# Patient Record
Sex: Male | Born: 1945 | Race: White | Hispanic: No | Marital: Married | Smoking: Never smoker
Health system: Southern US, Community
[De-identification: ages and names within clinical notes are randomized; demographics above are authoritative.]

## PROBLEM LIST (undated history)

## (undated) DIAGNOSIS — N200 Calculus of kidney: Secondary | ICD-10-CM

## (undated) DIAGNOSIS — B029 Zoster without complications: Secondary | ICD-10-CM

## (undated) DIAGNOSIS — M1990 Unspecified osteoarthritis, unspecified site: Secondary | ICD-10-CM

## (undated) DIAGNOSIS — R42 Dizziness and giddiness: Secondary | ICD-10-CM

## (undated) DIAGNOSIS — K219 Gastro-esophageal reflux disease without esophagitis: Secondary | ICD-10-CM

## (undated) DIAGNOSIS — E785 Hyperlipidemia, unspecified: Secondary | ICD-10-CM

## (undated) DIAGNOSIS — K5790 Diverticulosis of intestine, part unspecified, without perforation or abscess without bleeding: Secondary | ICD-10-CM

## (undated) DIAGNOSIS — K635 Polyp of colon: Secondary | ICD-10-CM

## (undated) DIAGNOSIS — E119 Type 2 diabetes mellitus without complications: Secondary | ICD-10-CM

## (undated) DIAGNOSIS — I10 Essential (primary) hypertension: Secondary | ICD-10-CM

## (undated) DIAGNOSIS — C801 Malignant (primary) neoplasm, unspecified: Secondary | ICD-10-CM

## (undated) DIAGNOSIS — K319 Disease of stomach and duodenum, unspecified: Secondary | ICD-10-CM

## (undated) DIAGNOSIS — I2510 Atherosclerotic heart disease of native coronary artery without angina pectoris: Secondary | ICD-10-CM

## (undated) DIAGNOSIS — M069 Rheumatoid arthritis, unspecified: Secondary | ICD-10-CM

## (undated) DIAGNOSIS — T7840XA Allergy, unspecified, initial encounter: Secondary | ICD-10-CM

## (undated) DIAGNOSIS — Z923 Personal history of irradiation: Secondary | ICD-10-CM

## (undated) HISTORY — DX: Disease of stomach and duodenum, unspecified: K31.9

## (undated) HISTORY — DX: Hyperlipidemia, unspecified: E78.5

## (undated) HISTORY — DX: Calculus of kidney: N20.0

## (undated) HISTORY — DX: Essential (primary) hypertension: I10

## (undated) HISTORY — DX: Unspecified osteoarthritis, unspecified site: M19.90

## (undated) HISTORY — DX: Diverticulosis of intestine, part unspecified, without perforation or abscess without bleeding: K57.90

## (undated) HISTORY — DX: Rheumatoid arthritis, unspecified: M06.9

## (undated) HISTORY — PX: CYSTOSCOPY: SUR368

## (undated) HISTORY — DX: Dizziness and giddiness: R42

## (undated) HISTORY — DX: Type 2 diabetes mellitus without complications: E11.9

## (undated) HISTORY — DX: Atherosclerotic heart disease of native coronary artery without angina pectoris: I25.10

## (undated) HISTORY — PX: LITHOTRIPSY: SUR834

## (undated) HISTORY — DX: Polyp of colon: K63.5

---

## 2003-03-03 HISTORY — PX: CORONARY ARTERY BYPASS GRAFT: SHX141

## 2005-05-15 ENCOUNTER — Ambulatory Visit: Payer: Self-pay | Admitting: Internal Medicine

## 2005-05-22 ENCOUNTER — Ambulatory Visit: Payer: Self-pay | Admitting: Internal Medicine

## 2005-06-15 ENCOUNTER — Ambulatory Visit: Payer: Self-pay | Admitting: Internal Medicine

## 2005-07-15 ENCOUNTER — Ambulatory Visit: Payer: Self-pay | Admitting: Internal Medicine

## 2005-10-09 ENCOUNTER — Ambulatory Visit: Payer: Self-pay | Admitting: Internal Medicine

## 2005-12-16 ENCOUNTER — Ambulatory Visit: Payer: Self-pay | Admitting: Cardiology

## 2005-12-29 ENCOUNTER — Ambulatory Visit: Payer: Self-pay | Admitting: Cardiology

## 2005-12-29 ENCOUNTER — Encounter: Payer: Self-pay | Admitting: Cardiology

## 2005-12-29 ENCOUNTER — Ambulatory Visit: Payer: Self-pay

## 2006-01-05 ENCOUNTER — Ambulatory Visit: Payer: Self-pay | Admitting: Internal Medicine

## 2006-02-11 ENCOUNTER — Ambulatory Visit: Payer: Self-pay | Admitting: Internal Medicine

## 2006-06-15 ENCOUNTER — Encounter: Payer: Self-pay | Admitting: Internal Medicine

## 2006-06-16 ENCOUNTER — Ambulatory Visit: Payer: Self-pay | Admitting: Internal Medicine

## 2006-06-16 LAB — CONVERTED CEMR LAB
ALT: 28 units/L (ref 0–40)
AST: 26 units/L (ref 0–37)
CK-MB: 2.1 ng/mL (ref 0.3–4.0)
CO2: 30 meq/L (ref 19–32)
Creatinine, Ser: 0.8 mg/dL (ref 0.4–1.5)
GFR calc Af Amer: 126 mL/min
GFR calc non Af Amer: 104 mL/min
Potassium: 4.3 meq/L (ref 3.5–5.1)
Sodium: 144 meq/L (ref 135–145)
Total Protein: 6.9 g/dL (ref 6.0–8.3)

## 2006-10-13 ENCOUNTER — Encounter: Payer: Self-pay | Admitting: Internal Medicine

## 2006-12-22 ENCOUNTER — Ambulatory Visit: Payer: Self-pay | Admitting: Internal Medicine

## 2007-01-24 ENCOUNTER — Ambulatory Visit: Payer: Self-pay | Admitting: Internal Medicine

## 2007-01-24 DIAGNOSIS — E1142 Type 2 diabetes mellitus with diabetic polyneuropathy: Secondary | ICD-10-CM | POA: Insufficient documentation

## 2007-01-24 DIAGNOSIS — E782 Mixed hyperlipidemia: Secondary | ICD-10-CM | POA: Insufficient documentation

## 2007-01-24 DIAGNOSIS — J329 Chronic sinusitis, unspecified: Secondary | ICD-10-CM | POA: Insufficient documentation

## 2007-01-24 DIAGNOSIS — M25519 Pain in unspecified shoulder: Secondary | ICD-10-CM | POA: Insufficient documentation

## 2007-01-24 DIAGNOSIS — E785 Hyperlipidemia, unspecified: Secondary | ICD-10-CM | POA: Insufficient documentation

## 2007-01-24 DIAGNOSIS — I10 Essential (primary) hypertension: Secondary | ICD-10-CM | POA: Insufficient documentation

## 2007-01-26 ENCOUNTER — Ambulatory Visit: Payer: Self-pay | Admitting: Cardiology

## 2007-01-31 ENCOUNTER — Ambulatory Visit: Payer: Self-pay | Admitting: Internal Medicine

## 2007-01-31 ENCOUNTER — Encounter (INDEPENDENT_AMBULATORY_CARE_PROVIDER_SITE_OTHER): Payer: Self-pay | Admitting: *Deleted

## 2007-01-31 LAB — CONVERTED CEMR LAB
Alkaline Phosphatase: 57 units/L (ref 39–117)
BUN: 10 mg/dL (ref 6–23)
Bilirubin, Direct: 0.2 mg/dL (ref 0.0–0.3)
CO2: 26 meq/L (ref 19–32)
Chloride: 110 meq/L (ref 96–112)
Creatinine, Ser: 0.9 mg/dL (ref 0.4–1.5)
Crystals: NEGATIVE
GFR calc Af Amer: 110 mL/min
Hgb A1c MFr Bld: 6 % (ref 4.6–6.0)
Leukocytes, UA: NEGATIVE
Potassium: 3.9 meq/L (ref 3.5–5.1)
Sodium: 143 meq/L (ref 135–145)
Squamous Epithelial / LPF: NEGATIVE /lpf
TSH: 1.98 microintl units/mL (ref 0.35–5.50)
Total Protein: 6.7 g/dL (ref 6.0–8.3)
Triglycerides: 127 mg/dL (ref 0–149)
VLDL: 25 mg/dL (ref 0–40)

## 2007-02-28 ENCOUNTER — Encounter: Payer: Self-pay | Admitting: Internal Medicine

## 2007-03-09 ENCOUNTER — Encounter: Payer: Self-pay | Admitting: Internal Medicine

## 2007-03-28 ENCOUNTER — Ambulatory Visit: Payer: Self-pay | Admitting: Internal Medicine

## 2007-03-28 DIAGNOSIS — M109 Gout, unspecified: Secondary | ICD-10-CM | POA: Insufficient documentation

## 2007-05-16 ENCOUNTER — Ambulatory Visit: Payer: Self-pay | Admitting: Internal Medicine

## 2007-05-30 ENCOUNTER — Telehealth: Payer: Self-pay | Admitting: Internal Medicine

## 2007-06-27 ENCOUNTER — Encounter: Payer: Self-pay | Admitting: Internal Medicine

## 2007-07-07 ENCOUNTER — Ambulatory Visit: Payer: Self-pay | Admitting: Internal Medicine

## 2007-07-07 LAB — CONVERTED CEMR LAB
Creatinine, Ser: 0.9 mg/dL (ref 0.4–1.5)
Glucose, Bld: 215 mg/dL — ABNORMAL HIGH (ref 70–99)
Uric Acid, Serum: 3.9 mg/dL — ABNORMAL LOW (ref 4.0–7.8)

## 2007-07-11 ENCOUNTER — Ambulatory Visit: Payer: Self-pay | Admitting: Internal Medicine

## 2007-07-11 DIAGNOSIS — H8110 Benign paroxysmal vertigo, unspecified ear: Secondary | ICD-10-CM | POA: Insufficient documentation

## 2007-10-13 ENCOUNTER — Ambulatory Visit: Payer: Self-pay | Admitting: Internal Medicine

## 2007-10-26 ENCOUNTER — Encounter: Payer: Self-pay | Admitting: Internal Medicine

## 2007-10-26 ENCOUNTER — Encounter: Admission: RE | Admit: 2007-10-26 | Discharge: 2007-10-31 | Payer: Self-pay | Admitting: Internal Medicine

## 2007-10-31 ENCOUNTER — Encounter: Payer: Self-pay | Admitting: Internal Medicine

## 2007-11-23 ENCOUNTER — Encounter: Payer: Self-pay | Admitting: Internal Medicine

## 2007-11-23 ENCOUNTER — Telehealth (INDEPENDENT_AMBULATORY_CARE_PROVIDER_SITE_OTHER): Payer: Self-pay | Admitting: *Deleted

## 2007-12-23 ENCOUNTER — Ambulatory Visit: Payer: Self-pay | Admitting: Internal Medicine

## 2007-12-23 LAB — CONVERTED CEMR LAB
BUN: 13 mg/dL (ref 6–23)
CO2: 26 meq/L (ref 19–32)
Calcium: 9.2 mg/dL (ref 8.4–10.5)
Chloride: 109 meq/L (ref 96–112)
Creatinine, Ser: 0.8 mg/dL (ref 0.4–1.5)
GFR calc non Af Amer: 103 mL/min
Sodium: 142 meq/L (ref 135–145)

## 2007-12-27 ENCOUNTER — Ambulatory Visit: Payer: Self-pay | Admitting: Internal Medicine

## 2007-12-27 DIAGNOSIS — D2330 Other benign neoplasm of skin of unspecified part of face: Secondary | ICD-10-CM | POA: Insufficient documentation

## 2007-12-27 DIAGNOSIS — M19049 Primary osteoarthritis, unspecified hand: Secondary | ICD-10-CM | POA: Insufficient documentation

## 2008-01-20 ENCOUNTER — Encounter: Payer: Self-pay | Admitting: Internal Medicine

## 2008-02-20 ENCOUNTER — Encounter: Payer: Self-pay | Admitting: Internal Medicine

## 2008-02-22 ENCOUNTER — Ambulatory Visit: Payer: Self-pay | Admitting: Internal Medicine

## 2008-02-22 DIAGNOSIS — J018 Other acute sinusitis: Secondary | ICD-10-CM | POA: Insufficient documentation

## 2008-03-29 ENCOUNTER — Telehealth: Payer: Self-pay | Admitting: Internal Medicine

## 2008-04-30 ENCOUNTER — Encounter: Payer: Self-pay | Admitting: Internal Medicine

## 2008-05-02 ENCOUNTER — Telehealth (INDEPENDENT_AMBULATORY_CARE_PROVIDER_SITE_OTHER): Payer: Self-pay | Admitting: *Deleted

## 2008-05-03 ENCOUNTER — Telehealth: Payer: Self-pay | Admitting: Internal Medicine

## 2008-06-06 ENCOUNTER — Ambulatory Visit: Payer: Self-pay | Admitting: Internal Medicine

## 2008-06-06 LAB — CONVERTED CEMR LAB
ALT: 50 units/L (ref 0–53)
AST: 38 units/L — ABNORMAL HIGH (ref 0–37)
CO2: 25 meq/L (ref 19–32)
Chloride: 110 meq/L (ref 96–112)
Creatinine,U: 131.6 mg/dL
Glucose, Bld: 138 mg/dL — ABNORMAL HIGH (ref 70–99)
LDL Cholesterol: 62 mg/dL (ref 0–99)
Microalb, Ur: 0.3 mg/dL (ref 0.0–1.9)
Sodium: 141 meq/L (ref 135–145)
Triglycerides: 107 mg/dL (ref 0.0–149.0)
VLDL: 21.4 mg/dL (ref 0.0–40.0)

## 2008-06-11 ENCOUNTER — Ambulatory Visit: Payer: Self-pay | Admitting: Internal Medicine

## 2008-06-14 ENCOUNTER — Telehealth: Payer: Self-pay | Admitting: Internal Medicine

## 2008-06-25 ENCOUNTER — Ambulatory Visit: Payer: Self-pay | Admitting: Internal Medicine

## 2008-06-25 LAB — CONVERTED CEMR LAB
Anti Nuclear Antibody(ANA): NEGATIVE
Calcium: 9.3 mg/dL (ref 8.4–10.5)
Chloride: 110 meq/L (ref 96–112)
Creatinine, Ser: 0.9 mg/dL (ref 0.4–1.5)
Creatinine,U: 144.9 mg/dL
Ferritin: 70.3 ng/mL (ref 22.0–322.0)
Microalb Creat Ratio: 4.1 mg/g (ref 0.0–30.0)
Microalb, Ur: 0.6 mg/dL (ref 0.0–1.9)
Potassium: 3.9 meq/L (ref 3.5–5.1)

## 2008-06-26 ENCOUNTER — Encounter: Payer: Self-pay | Admitting: Internal Medicine

## 2008-06-26 LAB — CONVERTED CEMR LAB: PSA: 1.02 ng/mL (ref 0.10–4.00)

## 2008-07-06 ENCOUNTER — Telehealth: Payer: Self-pay | Admitting: Internal Medicine

## 2008-07-08 ENCOUNTER — Encounter: Payer: Self-pay | Admitting: Internal Medicine

## 2008-09-26 ENCOUNTER — Telehealth: Payer: Self-pay | Admitting: Internal Medicine

## 2008-10-11 ENCOUNTER — Ambulatory Visit: Payer: Self-pay | Admitting: Internal Medicine

## 2008-10-11 DIAGNOSIS — I2510 Atherosclerotic heart disease of native coronary artery without angina pectoris: Secondary | ICD-10-CM | POA: Insufficient documentation

## 2008-10-19 ENCOUNTER — Ambulatory Visit: Payer: Self-pay | Admitting: Internal Medicine

## 2008-10-19 ENCOUNTER — Telehealth: Payer: Self-pay | Admitting: Internal Medicine

## 2008-10-24 ENCOUNTER — Telehealth: Payer: Self-pay | Admitting: Internal Medicine

## 2008-10-29 ENCOUNTER — Encounter: Payer: Self-pay | Admitting: Internal Medicine

## 2008-10-29 ENCOUNTER — Ambulatory Visit: Payer: Self-pay

## 2008-11-14 ENCOUNTER — Telehealth: Payer: Self-pay | Admitting: Internal Medicine

## 2008-11-16 ENCOUNTER — Encounter: Payer: Self-pay | Admitting: Internal Medicine

## 2008-12-04 ENCOUNTER — Telehealth: Payer: Self-pay | Admitting: Internal Medicine

## 2008-12-12 ENCOUNTER — Encounter: Payer: Self-pay | Admitting: Internal Medicine

## 2008-12-18 ENCOUNTER — Telehealth: Payer: Self-pay | Admitting: Internal Medicine

## 2008-12-31 ENCOUNTER — Ambulatory Visit: Payer: Self-pay | Admitting: Family

## 2009-01-15 ENCOUNTER — Ambulatory Visit: Payer: Self-pay | Admitting: Internal Medicine

## 2009-02-01 ENCOUNTER — Telehealth: Payer: Self-pay | Admitting: Internal Medicine

## 2009-02-07 ENCOUNTER — Telehealth: Payer: Self-pay | Admitting: Internal Medicine

## 2009-02-13 ENCOUNTER — Telehealth: Payer: Self-pay | Admitting: Internal Medicine

## 2009-03-06 ENCOUNTER — Telehealth: Payer: Self-pay | Admitting: Internal Medicine

## 2009-05-28 ENCOUNTER — Telehealth: Payer: Self-pay | Admitting: Internal Medicine

## 2009-08-02 ENCOUNTER — Telehealth: Payer: Self-pay | Admitting: Internal Medicine

## 2009-08-09 ENCOUNTER — Telehealth: Payer: Self-pay | Admitting: Internal Medicine

## 2009-09-16 ENCOUNTER — Ambulatory Visit: Payer: Self-pay | Admitting: Internal Medicine

## 2009-09-16 LAB — CONVERTED CEMR LAB
ALT: 49 units/L (ref 0–53)
AST: 32 units/L (ref 0–37)
BUN: 14 mg/dL (ref 6–23)
Bilirubin, Direct: 0.1 mg/dL (ref 0.0–0.3)
Calcium: 9.2 mg/dL (ref 8.4–10.5)
Cholesterol: 130 mg/dL (ref 0–200)
GFR calc non Af Amer: 104.26 mL/min (ref >60)
Glucose, Bld: 132 mg/dL — ABNORMAL HIGH (ref 70–99)
HDL: 36.7 mg/dL — ABNORMAL LOW (ref >39.00)
Microalb Creat Ratio: 1.3 mg/g (ref 0.0–30.0)
Potassium: 4.2 meq/L (ref 3.5–5.1)
Sodium: 139 meq/L (ref 135–145)
Total Bilirubin: 0.7 mg/dL (ref 0.3–1.2)
Total Protein: 6.3 g/dL (ref 6.0–8.3)
VLDL: 23.6 mg/dL (ref 0.0–40.0)

## 2009-09-19 ENCOUNTER — Ambulatory Visit: Payer: Self-pay | Admitting: Internal Medicine

## 2009-10-07 ENCOUNTER — Telehealth: Payer: Self-pay | Admitting: Internal Medicine

## 2009-10-18 ENCOUNTER — Encounter: Payer: Self-pay | Admitting: Internal Medicine

## 2009-10-18 ENCOUNTER — Telehealth: Payer: Self-pay | Admitting: Internal Medicine

## 2009-10-29 ENCOUNTER — Telehealth: Payer: Self-pay | Admitting: Internal Medicine

## 2009-11-05 ENCOUNTER — Ambulatory Visit: Payer: Self-pay | Admitting: Internal Medicine

## 2009-11-06 ENCOUNTER — Telehealth: Payer: Self-pay | Admitting: Internal Medicine

## 2010-01-06 ENCOUNTER — Ambulatory Visit: Payer: Self-pay | Admitting: Internal Medicine

## 2010-01-10 ENCOUNTER — Telehealth: Payer: Self-pay | Admitting: Internal Medicine

## 2010-02-17 ENCOUNTER — Telehealth: Payer: Self-pay | Admitting: Internal Medicine

## 2010-03-02 HISTORY — PX: CHOLECYSTECTOMY: SHX55

## 2010-03-20 ENCOUNTER — Encounter: Payer: Self-pay | Admitting: Internal Medicine

## 2010-03-20 ENCOUNTER — Ambulatory Visit
Admission: RE | Admit: 2010-03-20 | Discharge: 2010-03-20 | Payer: Self-pay | Source: Home / Self Care | Attending: Internal Medicine | Admitting: Internal Medicine

## 2010-03-20 LAB — CONVERTED CEMR LAB
CO2: 24 meq/L (ref 19–32)
Chloride: 105 meq/L (ref 96–112)
Glucose, Bld: 146 mg/dL — ABNORMAL HIGH (ref 70–99)
Microalb Creat Ratio: 10.2 mg/g (ref 0.0–30.0)
Microalb, Ur: 1.77 mg/dL (ref 0.00–1.89)
Platelets: 203 10*3/uL (ref 150–400)
Potassium: 4.1 meq/L (ref 3.5–5.3)
RBC: 4.88 M/uL (ref 4.22–5.81)
Sodium: 142 meq/L (ref 135–145)
WBC: 9.9 10*3/uL (ref 4.0–10.5)

## 2010-03-21 ENCOUNTER — Telehealth: Payer: Self-pay | Admitting: Internal Medicine

## 2010-03-21 ENCOUNTER — Encounter: Payer: Self-pay | Admitting: Internal Medicine

## 2010-04-01 NOTE — Progress Notes (Signed)
Summary: refill--allopurinol, plavix  Phone Note Refill Request Message from:  [facility] on August 02, 2009 11:30 AM  Refills Requested: Medication #1:  PLAVIX 75 MG  TABS Take 1 tablet by mouth once a day   Dosage confirmed as above?Dosage Confirmed   Supply Requested: 3 months  Medication #2:  ALLOPURINOL 300 MG  TABS Take 1 tablet by mouth once a day   Dosage confirmed as above?Dosage Confirmed   Supply Requested: 3 months Received fax request from [facility] for 90 day supply x 3 refills.  Pt has no follow up in system. When does pt need to return?  Next Appointment Scheduled: none Initial call taken by: Mervin Kung CMA,  August 02, 2009 11:30 AM  Follow-up for Phone Call        ok for refill. pt needs to come in for ov within 1 month labs to be done before OV BMP prior to visit, ICD-9:  401.9 Hepatic Panel prior to visit, ICD-9:  272.4 Lipid Panel prior to visit, ICD-9:  272.4 TSH prior to visit, ICD-9: 272.4 HbgA1C prior to visit, ICD-9: 250.00 Urine Microalbumin prior to visit, ICD-9: 250.00   Additional Follow-up for Phone Call Additional follow up Details #1::        Left message on machine to return my call.  Mervin Kung CMA  August 02, 2009 2:01 PM   Left message on machine to return my call.  Mervin Kung CMA  August 05, 2009 4:31 PM   Left message on machine to return my call. Mervin Kung CMA  August 06, 2009 10:05 AM     Additional Follow-up for Phone Call Additional follow up Details #2::    call placed to patient at [phone], no answer detailed voice message left informing patient office visit is needed in one month with labs. Message left advising patient to call to schedule appointments. Follow-up by: Glendell Docker CMA,  August 07, 2009 11:19 AM  Prescriptions: ALLOPURINOL 300 MG  TABS (ALLOPURINOL) Take 1 tablet by mouth once a day  #90 x 3   Entered by:   Mervin Kung CMA   Authorized by:   [name]   Signed by:   Mervin Kung CMA on 08/02/2009   Method used:   Electronically to        [facility]* (mail-order)             ,          Ph: [phone]       Fax: [phone]   RxID:   [id number] PLAVIX 75 MG  TABS (CLOPIDOGREL BISULFATE) Take 1 tablet by mouth once a day  #90 x 3   Entered by:   Mervin Kung CMA   Authorized by:   [name]   Signed by:   Mervin Kung CMA on 08/02/2009   Method used:   Electronically to        [facility]* (mail-order)             ,          Ph: [phone]       Fax: [phone]   RxID:   [id number]

## 2010-04-01 NOTE — Progress Notes (Signed)
Summary: Anitbiotic & Pain Medication  Phone Note Call from Patient Call back at [phone]   Caller: Spouse-Pat  Call For: [name] Summary of Call: patients wife called and left voice message stating patient is requesting a rx for antibiotic. She states he has a sinus infection, he is draining yellow and red mucoous. If approved she is requesting refill to [facility] in [place]. Her message also states patient is requesting refill on pain medication.   Call was returned to patient at [phone], she was informed Dr Artist Pais does not prescribe antibiotics without patient being seen. She request that I  check with Dr Artist Pais to see if he will authorize a rx for antibiotics. She was informed Dr Artist Pais was out of the office today, and she would receive a call tomorrow regarding medication. She was reminded that patient would lneed to schedule appointment if antibiotics was needed. She verbalized understanding and will wait for a response from Dr. Artist Pais Initial call taken by: Glendell Docker CMA,  October 29, 2009 1:30 PM  Follow-up for Phone Call        [facility] medical board policy - we can not prescribe abx w/o seeing pt.    Follow-up by: [name],  October 31, 2009 12:40 PM  Additional Follow-up for Phone Call Additional follow up Details #1::        call placed to patient at  [phone], no answer. A detailed voice message was left informing patient office visit was needed.  Additional Follow-up by: Glendell Docker CMA,  October 31, 2009 2:55 PM

## 2010-04-01 NOTE — Medication Information (Signed)
Summary: Interaction with Plavix & Pantoprazole/[facility]  Interaction with Plavix & Pantoprazole/[facility]   Imported By: Lanelle Bal 12/03/2009 10:30:07  _____________________________________________________________________  External Attachment:    Type:   Image     Comment:   External Document

## 2010-04-01 NOTE — Progress Notes (Signed)
Summary: refills--metformin, hydrocodone  Phone Note Refill Request Message from:  [facility] [phone] on August 09, 2009 3:32 PM  Refills Requested: Medication #1:  VICODIN 5-500 MG  TABS one tablet by mouth every 12hours as needed   Dosage confirmed as above?Dosage Confirmed   Supply Requested: 1 month   Last Refilled: 07/11/2009  Medication #2:  METFORMIN HCL 500 MG TABS Take 1 tablet by mouth two times a day   Dosage confirmed as above?Dosage Confirmed   Supply Requested: 1 month   Last Refilled: 06/05/2009  Follow-up for Phone Call        ok for refill Follow-up by: [name],  August 09, 2009 5:11 PM    Prescriptions: VICODIN 5-500 MG  TABS (HYDROCODONE-ACETAMINOPHEN) one tablet by mouth every 12hours as needed  #45 x 3   Entered by:   Mervin Kung CMA   Authorized by:   [name]   Signed by:   [name] on 08/09/2009   Method used:   [facility]ph[facility] to ...       [facility]  [place] [address] [facility]       [address]       [place], [place]  [address]       Ph: [phone] or [phone]       Fax: [phone]   RxID:   [id number] METFORMIN HCL 500 MG TABS (METFORMIN HCL) Take 1 tablet by mouth two times a day  #60 x 1   Entered by:   Mervin Kung CMA   Authorized by:   [name]   Signed by:   [name] on 08/09/2009   Method used:   Telephoned to ...       [facility]  [place] [address] (retail)       [address]       [place], [place]  [address]       Ph: [phone] or [phone]       Fax: [phone]   RxID:   [id number]

## 2010-04-01 NOTE — Progress Notes (Signed)
Summary: Metformin Refill  Phone Note Call from Patient Call back at Home Phone [phone]   Caller: Patient Reason for Call: Refill Medication Summary of Call: patient called and left voice message stating he is in need a of refill for Metformin. [facility] [place] Initial call taken by: Glendell Docker CMA,  March 06, 2009 10:04 AM  Follow-up for Phone Call        Phone Call Completed, [facility] [place] Follow-up by: Glendell Docker CMA,  March 06, 2009 10:06 AM    Prescriptions: GLUMETZA 1000 MG XR24H-TAB (METFORMIN HCL) one by mouth once daily Brand medically necessary #30 x 5   Entered by:   Glendell Docker CMA   Authorized by:   [name]   Signed by:   Glendell Docker CMA on 03/06/2009   Method used:   [facility]        [facility]  [place] [address] [facility]       [address]       [place], [place]  [address]       Ph: [phone] or [phone]       Fax: [phone]   RxID:   [id number]

## 2010-04-01 NOTE — Progress Notes (Signed)
  Phone Note Outgoing Call   Summary of Call: pt called re:  refill for topicort.  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,  October 07, 2009 5:30 PM    New/Updated Medications: DESOXIMETASONE 0.25 % CREA (DESOXIMETASONE) apply two times a day as directed Prescriptions: DESOXIMETASONE 0.25 % CREA (DESOXIMETASONE) apply two times a day as directed  #30 grams x 5   Entered and Authorized by:   [name]   Signed by:   [name] on 10/07/2009   Method used:   Electronically to        [facility] [place] [address] (retail)       [address] [place], [place]  [address]       Ph: [phone]       Fax: [phone]   RxID:   [id number]

## 2010-04-01 NOTE — Progress Notes (Signed)
Summary: Vicodin Refill  Phone Note Refill Request Message from:  Fax from Pharmacy on January 10, 2010 2:02 PM  Refills Requested: Medication #1:  VICODIN 5-500 MG  TABS one tablet by mouth every 12hours as needed   Dosage confirmed as above?Dosage Confirmed   Brand Name Necessary? No   Supply Requested: 1 month   Last Refilled: 12/13/2009  Method Requested: Electronic Next Appointment Scheduled: 11-14 Dr Artist Pais  Initial call taken by: Roselle Locus,  January 10, 2010 2:02 PM  Follow-up for Phone Call        rx was refilled x 3 in Sept Is pt using pain medication more frequently? Follow-up by: [name],  January 12, 2010 8:49 PM  Additional Follow-up for Phone Call Additional follow up Details #1::        call placed to patient at [phone], his wife Dennie Bible state patient was not available. Message was left with her to have patient return call regarding pain medication Additional Follow-up by: Glendell Docker CMA,  January 13, 2010 12:25 PM    Additional Follow-up for Phone Call Additional follow up Details #2::    call placed to patient at [phone], patients wife, stated patient was not available. Message was left with Dennie Bible to have patient return phone call regarding pain medication refill.  Glendell Docker CMA  January 15, 2010 8:57 AM   Additional Follow-up for Phone Call Additional follow up Details #3:: Details for Additional Follow-up Action Taken: no return call from patient regarding rx request Additional Follow-up by: Glendell Docker CMA,  January 16, 2010 8:32 AM

## 2010-04-01 NOTE — Assessment & Plan Note (Signed)
Summary: 3 month follow up/mjf   Vital Signs:  Patient profile:   65 year old [place] Weight:      161.50 pounds BMI:     27.82 O2 Sat:      97 % on Room air Temp:     97.7 degrees F oral Pulse rate:   64 / minute Pulse rhythm:   regular Resp:     16 per minute BP sitting:   122 / 60  (left arm) Cuff size:   regular  Vitals Entered By: Glendell Docker CMA (September 19, 2009 10:41 AM)  O2 Flow:  Room air CC: Rm 3- Follow up disease management, Type 2 diabetes mellitus follow-up Is Patient Diabetic? Yes Did you bring your meter with you today? No Pain Assessment Patient in pain? no       Does patient need assistance? Functional Status Self care Ambulation Normal Comments medication refills to [facility], requesting a rx for topicort cream to apply on back of ear   Primary Care Provider:  [name]  CC:  Rm 3- Follow up disease management and Type 2 diabetes mellitus follow-up.  History of Present Illness:  Type 2 Diabetes Mellitus Follow-Up      This is a 65 year old man who presents for Type 2 diabetes mellitus follow-up.  The patient denies weight gain.  The patient denies the following symptoms: chest pain.  Since the last visit the patient reports good dietary compliance, compliance with medications, and not exercising regularly.    Htn - stable  Allergies: 1)  ! Codeine 2)  ! Januvia (Sitagliptin Phosphate) 3)  Tessalon Perles (Benzonatate)  Past History:  Past Medical History: Depression Arthritis  DM II  Kidney stones   Severe CAD      --s/p MI 2000 with PTCA    --s/p CABG 2005 by Dr. Nathaniel Man at [facility]         - last EF 50-55% HTN Hyperlipidemia Gout Vertigo     Past Surgical History: CABG 2005    - 5 grafts    Family History: Mother deceased at age 46 secondary to myocardial infarction.   Father deceased at age 42, had a history of colon cancer and stroke.    Sister age 9 who had stroke and also has type 2 diabetes.   Another sibling with a  history of cerebral hemorrhage  Brother with significant coronary artery disease status post bypass surgery.     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   Married Tobacco use - no Alcohol use-no        Physical Exam  General:  alert, well-developed, and well-nourished.   Lungs:  normal respiratory effort, normal breath sounds, and no wheezes.   Heart:  normal rate, regular rhythm, no murmur, and no gallop.   Extremities:  trace left pedal edema and trace right pedal edema.   Psych:  normally interactive, good eye contact, not anxious appearing, and not depressed appearing.     Impression & Recommendations:  Problem # 1:  DIABETES-TYPE 2 (ICD-250.00) Assessment Deteriorated he would like to avoid additional oral agents.  start regular exercise program  His updated medication list for this problem includes:    Altace 5 Mg Caps (Ramipril) .Marland Kitchen... Take 2 capsule by mouth once daily    Adult Aspirin Ec Low Strength 81 Mg Tbec (Aspirin) .Marland Kitchen... Take 1 tablet by mouth once a day    Metformin Hcl 500 Mg Tabs (  Metformin hcl) .Marland Kitchen... Take 1 tablet by mouth two times a day  Labs Reviewed: Creat: 0.8 (09/16/2009)    Reviewed HgBA1c results: 7.1 (09/16/2009)  6.4 (06/25/2008)  Problem # 2:  HYPERTENSION (ICD-401.9)  His updated medication list for this problem includes:    Altace 5 Mg Caps (Ramipril) .Marland Kitchen... Take 2 capsule by mouth once daily    Amlodipine Besylate 5 Mg Tabs (Amlodipine besylate) .Marland Kitchen... Take 1 tablet by mouth once a day    Toprol Xl 50 Mg Tb24 (Metoprolol succinate) .Marland Kitchen... Take 1 tablet by mouth once a day  BP today: 122/60 Prior BP: 132/70 (01/15/2009)  Labs Reviewed: K+: 4.2 (09/16/2009) Creat: : 0.8 (09/16/2009)   Chol: 130 (09/16/2009)   HDL: 36.70 (09/16/2009)   LDL: 70 (09/16/2009)   TG: 118.0 (09/16/2009)  Problem # 3:  HYPERLIPIDEMIA (ICD-272.2) well controlled.  His updated medication list for this problem includes:     Lipitor 20 Mg Tabs (Atorvastatin calcium) .Marland Kitchen... Take 1 tablet by mouth once a day at bedtime  Labs Reviewed: SGOT: 32 (09/16/2009)   SGPT: 49 (09/16/2009)   HDL:36.70 (09/16/2009), 32.40 (06/06/2008)  LDL:70 (09/16/2009), 62 (06/06/2008)  Chol:130 (09/16/2009), 116 (06/06/2008)  Trig:118.0 (09/16/2009), 107.0 (06/06/2008)  Complete Medication List: 1)  Plavix 75 Mg Tabs (Clopidogrel bisulfate) .... Take 1 tablet by mouth once a day 2)  Lipitor 20 Mg Tabs (Atorvastatin calcium) .... Take 1 tablet by mouth once a day at bedtime 3)  Altace 5 Mg Caps (Ramipril) .... Take 2 capsule by mouth once daily 4)  Adult Aspirin Ec Low Strength 81 Mg Tbec (Aspirin) .... Take 1 tablet by mouth once a day 5)  Colchicine 0.6 Mg Tabs (Colchicine) .... One by mouth once daily as needed 6)  Zantac 75 75 Mg Tabs (Ranitidine hcl) .... Take 1 tablet by mouth once a day 7)  Vicodin 5-500 Mg Tabs (Hydrocodone-acetaminophen) .... One tablet by mouth every 12hours as needed 8)  Amlodipine Besylate 5 Mg Tabs (Amlodipine besylate) .... Take 1 tablet by mouth once a day 9)  Allopurinol 300 Mg Tabs (Allopurinol) .... Take 1 tablet by mouth once a day 10)  Toprol Xl 50 Mg Tb24 (Metoprolol succinate) .... Take 1 tablet by mouth once a day 11)  Metformin Hcl 500 Mg Tabs (Metformin hcl) .... Take 1 tablet by mouth two times a day 12)  Pantoprazole Sodium 40 Mg Tbec (Pantoprazole sodium) .... One by mouth once daily  Patient Instructions: 1)  Please schedule a follow-up appointment in 4 months. 2)  BMP prior to visit, ICD-9:  401.9 3)  HbgA1C prior to visit, ICD-9:  250.00 4)  Please return for lab work one (1) week before your next appointment.  Prescriptions: METFORMIN HCL 500 MG TABS (METFORMIN HCL) Take 1 tablet by mouth two times a day  #180 x 3   Entered and Authorized by:   [name]   Signed by:   [name] on 09/19/2009   Method used:   Electronically to        [facility] [place] [address] (retail)        [address] [place], [place]  [address]       Ph: [phone]       Fax: [phone]   RxID:   [id number] PANTOPRAZOLE SODIUM 40 MG TBEC (PANTOPRAZOLE SODIUM) one by mouth once daily  #90 x 3   Entered and Authorized by:   D. Thomos Lemons  DO   Signed by:   [name] on 09/19/2009   Method used:   Electronically to        [facility]* (retail)             ,          Ph: [phone]       Fax: [phone]   RxID:   [id number] TOPROL XL 50 MG  TB24 (METOPROLOL SUCCINATE) Take 1 tablet by mouth once a day  #90 x 3   Entered and Authorized by:   [name]   Signed by:   [name] on 09/19/2009   Method used:   Electronically to        [facility]* (retail)             ,          Ph: [phone]       Fax: [phone]   RxID:   [id number] ALLOPURINOL 300 MG  TABS (ALLOPURINOL) Take 1 tablet by mouth once a day  #90 x 3   Entered and Authorized by:   [name]   Signed by:   [name] on 09/19/2009   Method used:   Electronically to        [facility]* (retail)             ,          Ph: [phone]       Fax: [phone]   RxID:   [id number] AMLODIPINE BESYLATE 5 MG  TABS (AMLODIPINE BESYLATE) Take 1 tablet by mouth once a day  #90 Tablet x 3   Entered and Authorized by:   [name]   Signed by:   [name] on 09/19/2009   Method used:   Electronically to        [facility]* (retail)             ,          Ph: [phone]       Fax: [phone]   RxID:   [id number] ALTACE 5 MG  CAPS (RAMIPRIL) Take 2 capsule by mouth once daily  #180 Capsule x 3   Entered and Authorized by:   [name]   Signed by:   [name] on 09/19/2009   Method used:   Electronically to        [facility]* (retail)             ,          Ph: [phone]       Fax: [phone]   RxID:   [id number] LIPITOR 20 MG  TABS (ATORVASTATIN CALCIUM) Take 1 tablet by mouth once a day at bedtime   #90 Tablet x 3   Entered and Authorized by:   [name]   Signed by:   [name] on 09/19/2009   Method used:   Electronically to        [facility]* (retail)             ,          Ph: [phone]       Fax: [phone]   RxID:   [id number]   Current Allergies (reviewed today): ! CODEINE ! JANUVIA (SITAGLIPTIN PHOSPHATE) TESSALON PERLES (BENZONATATE)

## 2010-04-01 NOTE — Progress Notes (Signed)
Summary: VIcodin Refill  Phone Note Call from Patient Call back at Home Phone [phone]   Caller: Spouse Reason for Call: Refill Medication Summary of Call: patient wife called stating patient is requesting a refill on his Vicodin Initial call taken by: Glendell Docker CMA,  May 28, 2009 3:03 PM  Follow-up for Phone Call        ok to refill x 3 Follow-up by: [name],  May 28, 2009 3:13 PM  Additional Follow-up for Phone Call Additional follow up Details #1::        Rx Called In, patient aware Additional Follow-up by: Glendell Docker CMA,  May 28, 2009 4:44 PM    Prescriptions: VICODIN 5-500 MG  TABS (HYDROCODONE-ACETAMINOPHEN) one tablet by mouth every 12hours as needed  #45 x 3   Entered by:   Glendell Docker CMA   Authorized by:   [name]   Signed by:   Glendell Docker CMA on 05/28/2009   Method used:   [facility]phoned to ...       [facility]  [place] [address] ([facility]ail)       [address]       [place], [place]  [address]       Ph: [phone] or [phone]       Fax: [phone]   RxID:   [id number]

## 2010-04-01 NOTE — Assessment & Plan Note (Signed)
Summary: PER DR YOO/DT   Vital Signs:  Patient profile:   65 year old male Height:      64 inches Weight:      164.50 pounds BMI:     28.34 Temp:     97.6 degrees F oral Pulse rate:   66 / minute Pulse rhythm:   regular Resp:     16 per minute BP sitting:   140 / 86  (right arm) Cuff size:   regular  Vitals Entered By: Mervin Kung CMA Duncan Dull) (November 05, 2009 3:38 PM) Is Patient Diabetic? Yes Comments Needs refill on Vicodin. Pt no longer takes Zantac or Pantoprazole; doesn't need it anymore.  Nicki Guadalajara Fergerson CMA Duncan Dull)  November 05, 2009 3:42 PM    Primary Care Provider:  Dondra Spry DO   History of Present Illness: 65 y/o with hx of DM II and CAD c/o sinus drainage, facial pressure and intermittent dizziness x 3 weeks.  yellow green nasal discharge that is  blood tinged.   right maxillary sinus worse  Preventive Screening-Counseling & Management  Alcohol-Tobacco     Alcohol drinks/day: 0     Smoking Status: never  Allergies: 1)  ! Codeine 2)  ! Januvia (Sitagliptin Phosphate) 3)  Tessalon Perles (Benzonatate)  Past History:  Past Medical History: Depression Arthritis  DM II  Kidney stones    Severe CAD      --s/p MI 2000 with PTCA    --s/p CABG 2005 by Dr. Nathaniel Man at [facility]         - last EF 50-55% HTN Hyperlipidemia Gout Vertigo     Past Surgical History: CABG 2005    - 5 grafts     Family History: Mother deceased at age 25 secondary to myocardial infarction.   Father deceased at age 96, had a history of colon cancer and stroke.    Sister age 59 who had stroke and also has type 2 diabetes.   Another sibling with a history of cerebral hemorrhage  Brother with significant coronary artery disease status post bypass surgery.      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   Ears:   R ear normal and L ear normal.   Nose:  mucosal erythema and mucosal edema.   Mouth:  pharyngeal erythema.   Lungs:  normal respiratory effort and normal breath sounds.   Heart:  normal rate, regular rhythm, and no gallop.   Extremities:  trace left pedal edema and trace right pedal edema.     Impression & Recommendations:  Problem # 1:  RHINOSINUSITIS, ACUTE (ICD-461.8)  His updated medication list for this problem includes:    Cefuroxime Axetil 500 Mg Tabs (Cefuroxime axetil) ..... One by mouth bid  Instructed on treatment. Call if symptoms persist or worsen.   Complete Medication List: 1)  Plavix 75 Mg Tabs (Clopidogrel bisulfate) .... Take 1 tablet by mouth once a day 2)  Lipitor 20 Mg Tabs (Atorvastatin calcium) .... Take 1 tablet by mouth once a day at bedtime 3)  Altace 5 Mg Caps (Ramipril) .... Take 2 capsule by mouth once daily 4)  Adult Aspirin Ec Low Strength 81 Mg Tbec (Aspirin) .... Take 1 tablet by mouth once a day 5)  Colchicine 0.6 Mg Tabs (Colchicine) .... One by mouth  once daily as needed 6)  Zantac 75 75 Mg Tabs (Ranitidine hcl) .... Take 1 tablet by mouth once a day 7)  Vicodin 5-500 Mg Tabs (Hydrocodone-acetaminophen) .... One tablet by mouth every 12hours as needed 8)  Amlodipine Besylate 5 Mg Tabs (Amlodipine besylate) .... Take 1 tablet by mouth once a day 9)  Allopurinol 300 Mg Tabs (Allopurinol) .... Take 1 tablet by mouth once a day 10)  Toprol Xl 50 Mg Tb24 (Metoprolol succinate) .... Take 1 tablet by mouth once a day 11)  Metformin Hcl 500 Mg Tabs (Metformin hcl) .... Take 1 tablet by mouth two times a day 12)  Pantoprazole Sodium 40 Mg Tbec (Pantoprazole sodium) .... One by mouth once daily 13)  Desoximetasone 0.25 % Crea (Desoximetasone) .... Apply two times a day as directed 14)  Cefuroxime Axetil 500 Mg Tabs (Cefuroxime axetil) .... One by mouth bid  Other Orders: Influenza Vaccine MCR (13086) Flu Vaccine 34yrs + MEDICARE PATIENTS (V7846)  Patient  Instructions: 1)  Patient advised to call office if symptoms persist or worsen. Prescriptions: CEFUROXIME AXETIL 500 MG TABS (CEFUROXIME AXETIL) one by mouth bid  #20 x 0   Entered and Authorized by:   [name]   Signed by:   [name] on 11/05/2009   Method used:   Electronically to        [facility] [address] (retail)       [address] [place], [place]  [address]       Ph: [phone]       Fax: [phone]   RxID:   [id number]   Current Allergies (reviewed today): ! CODEINE ! JANUVIA (SITAGLIPTIN PHOSPHATE) TESSALON PERLES (BENZONATATE)   Immunizations Administered:  Influenza Vaccine # 1:    Vaccine Type: Fluvax MCR    Site: right deltoid    Mfr: GlaxoSmithKline    Dose: 0.5 ml    Route: IM    Given by: Glendell Docker CMA    Exp. Date: 08/30/2010    Lot #: [id number]    VIS given: 09/24/09 version given November 05, 2009.  Flu Vaccine Consent Questions:    Do you have a history of severe allergic reactions to this vaccine? no    Any prior history of allergic reactions to egg and/or gelatin? no    Do you have a sensitivity to the preservative Thimersol? no    Do you have a past history of Guillan-Barre Syndrome? no    Do you currently have an acute febrile illness? no    Have you ever had a severe reaction to latex? no    Vaccine information given and explained to patient? yes

## 2010-04-01 NOTE — Progress Notes (Signed)
Summary: Medication Change  Phone Note Call from Patient Call back at Home Phone [phone]   Caller: Patient Reason for Call: Refill Medication Summary of Call: patient called back and left voice mail on clarification on his message for a refill on Glumetza. Patient message states the Elmon Kirschner is not working for him, and he does not like to have to cut the medication in half. [facility] Met[facility] HCL 500 provided by the manufacturer [employer]. He is requesting a new rx for Metformin HCL 500 two times a day sent to his pharmacy. He no longer wants to take the [facility]. Initial call taken by: Glendell Docker CMA,  March 06, 2009 6:35 PM  Follow-up for Phone Call        ok to change rx Follow-up by: [name],  March 06, 2009 7:10 PM  Additional Follow-up for Phone Call Additional follow up Details #1::        rx change sent to pharmacy, patient advised Additional Follow-up by: Glendell Docker CMA,  March 07, 2009 1:10 PM    New/Updated Medications: METFORMIN HCL 500 MG TABS (METFORMIN HCL) Take 1 tablet by mouth two times a day Prescriptions: METFORMIN HCL 500 MG TABS (METFORMIN HCL) Take 1 tablet by mouth two times a day  #60 x 3   Entered by:   Glendell Docker CMA   Authorized by:   [name]   Signed by:   Glendell Docker CMA on 03/07/2009   Method used:   [facility]  [place] [address]       [address]       [place], [place]  [address]       Ph: [phone] or [phone]       Fax: [phone]   RxID:   [id number]

## 2010-04-01 NOTE — Progress Notes (Signed)
Summary: Vicodin Refill  Phone Note Refill Request Message from:  Fax from Pharmacy on November 06, 2009 10:35 AM  Refills Requested: Medication #1:  VICODIN 5-500 MG  TABS one tablet by mouth every 12hours as needed   Dosage confirmed as above?Dosage Confirmed   Brand Name Necessary? No   Supply Requested: 1 month   Last Ref[facility]: [id number] [address] [place], [phone], [phone] fax   Method Requested: Electronic Next Appointment Scheduled: 11.7.11 Initial call taken by: Lannette Donath,  November 06, 2009 10:41 AM  Follow-up for Phone Call        patient called back checking on status of rx. He states he uses the medication for arthritis in his back Follow-up by: Glendell Docker CMA,  November 07, 2009 1:57 PM  Additional Follow-up for Phone Call Additional follow up Details #1::        ok to refill x 3 Additional Follow-up by: D. Thomos  DO,  November 07, 2009 3:39 PM    Additional Follow-up for Phone Call Additional follow up Details #2::    rx called to pharmacy, patient is aware Follow-up by: Glendell Docker CMA,  November 07, 2009 4:42 PM  Prescriptions: VICODIN 5-500 MG  TABS (HYDROCODONE-ACETAMINOPHEN) one tablet by mouth every 12hours as needed  #45 x 3   Entered by:   Glendell Docker CMA   Authorized by:   D. Thomos  DO   Signed by:   Glendell Docker CMA on 11/07/2009   Method used:   Telephoned to ...       [facility]  [place] [address] ([facility]ail[facility]       [address]       [place], [place]  [address]       Ph: [phone] or [phone]       Fax: [phone]   RxID:   [id number]

## 2010-04-01 NOTE — Progress Notes (Signed)
Summary: Metformin Refill  Phone Note Refill Request Message from:  Fax from Pharmacy on October 18, 2009 9:13 AM  Refills Requested: Medication #1:  METFORMIN HCL 500 MG TABS Take 1 tablet by mouth two times a day   Dosage confirmed as above?Dosage Confirmed   Brand Name Necessary? No   Supply Requested: 1 month   [facility]: [facility]2009 [facility] [place]   Method Requested: Electronic Next Appointment Scheduled: 11/14  @ 10:20 am Dr Artist Pais Initial call taken by: Glendell Docker CMA,  October 18, 2009 9:13 AM    Prescriptions: METFORMIN HCL 500 MG TABS (METFORMIN HCL) Take 1 tablet by mouth two times a day  #60 x 5   Entered by:   Glendell Docker CMA   Authorized by:   [name]   Signed by:   Glendell Docker CMA on 10/18/2009   Method used:   [facility]        [facility]  [place] [address] [facility]       [address]       [place], [place]  [address]       Ph: [phone] or [phone]       Fax: [phone]   RxID:   [id number]

## 2010-04-03 NOTE — Progress Notes (Signed)
Summary: Rx status  Phone Note Call from Patient   Caller: Spouse Reason for Call: Refill Medication Summary of Call: Patient spouse called to say that pt rx, prescribed by her pcp was not sent into the pharmacy. Please Assist. Initial call taken by: Elba Barman,  March 21, 2010 3:07 PM  Follow-up for Phone Call        call returned to patient, he was informed [facility] [place]. They stated they did not check that pharmacy, but will do so.  [facility] verifed that it is ready for pick up Follow-up by: Glendell Docker CMA,  March 21, 2010 4:30 PM

## 2010-04-03 NOTE — Letter (Signed)
   [place] [facility] [address] [place], [place]  [address]  [place] Phone: [phone]      March 21, 2010   [facility] [address] [place], [place] [address]  RE:  LAB RESULTS  Dear  Mr. [facility],  The following is an interpretation of your most recent lab tests.  Please take note of any instructions provided or changes to medications that have resulted from your lab work.  ELECTROLYTES:  Good - no changes needed  KIDNEY FUNCTION TESTS:  Good - no changes needed    DIABETIC STUDIES:  Good - no changes needed Blood Glucose: 146   HgbA1C: 6.7   Microalbumin/Creatinine Ratio: 10.2     CBC:  Good - no changes needed       Sincerely Yours,    Dr. Thomos Lemons  Appended Document:  Mailed.

## 2010-04-03 NOTE — Progress Notes (Signed)
Summary: Plavix Refill  Phone Note Call from Patient Call back at Home Phone [phone]   Caller: Patient Call For: [name] Summary of Call: patient called and left voice message stating he will be changing insurances the first of the year and, he is in need a of rx refill on Plavix  to [facility] [address] until hs insurance changes.  Initial call taken by: Glendell Docker CMA,  February 17, 2010 12:05 PM  Follow-up for Phone Call        Phone Call Completed, rx sent to pharmacy electronically Follow-up by: Glendell Docker CMA,  February 17, 2010 12:08 PM    Prescriptions: PLAVIX 75 MG  TABS (CLOPIDOGREL BISULFATE) Take 1 tablet by mouth once a day  #30 x 0   Entered by:   Glendell Docker CMA   Authorized by:   [name]   Signed by:   Glendell Docker CMA on 02/17/2010   Method used:   Electronically to        [facility] [address] (retail)       [address] [place], [place]  [address]       Ph: [phone]       Fax: [phone]   RxID:   [id number]

## 2010-04-09 NOTE — Assessment & Plan Note (Signed)
Summary: 3 MONTH FOLLOW UP/MHF   Vital Signs:  Patient profile:   65 year old male Height:      64 inches Weight:      164 pounds BMI:     28.25 O2 Sat:      99 % on Room air Temp:     97.7 degrees F oral Pulse rate:   76 / minute Resp:     18 per minute BP sitting:   120 / 80  (right arm) Cuff size:   large  Vitals Entered By: Glendell Docker CMA (March 20, 2010 2:28 PM)  O2 Flow:  Room air CC: 3 Month follow up  Is Patient Diabetic? No Pain Assessment Patient in pain? no       Does patient need assistance? Ambulation Bedbound   Primary Care Provider:  Dondra Spry DO  CC:  3 Month follow up .  History of Present Illness: last week - installed filter for well  right shoulder stiff  chronic sinus congestion green mucus  head feels swimmy  scheduled fro dental procedure and they need to know if he will need to stop Plavix    Allergies: 1)  ! Codeine 2)  ! Januvia (Sitagliptin Phosphate) 3)  Tessalon Perles (Benzonatate)  Past History:  Past Medical History: Depression Arthritis  DM II  Kidney stones     Severe CAD      --s/p MI 2000 with PTCA    --s/p CABG 2005 by Dr. Nathaniel Man at [facility]         - last EF 50-55% HTN Hyperlipidemia Gout Vertigo     Family History: Mother deceased at age 42 secondary to myocardial infarction.   Father deceased at age 12, had a history of colon cancer and stroke.    Sister age 12 who had stroke and also has type 2 diabetes.   Another sibling with a history of cerebral hemorrhage  Brother with significant coronary artery disease status post bypass surgery.         Physical Exam  General:  alert, well-developed, and well-nourished.   Ears:  R ear normal and L ear normal.   Mouth:  poor dentition.   Lungs:  normal respiratory effort and normal breath sounds.   Heart:  normal rate, regular rhythm, and no gallop.     Impression & Recommendations:  Problem # 1:  RHINOSINUSITIS, ACUTE (ICD-461.8)  The  following medications were removed from the medication list:    Cefuroxime Axetil 500 Mg Tabs (Cefuroxime axetil) ..... One by mouth bid His updated medication list for this problem includes:    Cefuroxime Axetil 500 Mg Tabs (Cefuroxime axetil) ..... One by mouth two times a day  Instructed on treatment. Call if symptoms persist or worsen.   Problem # 2:  CAD (ICD-414.00) hold plavix and aspirin x 7 days before dental procedure  His updated medication list for this problem includes:    Plavix 75 Mg Tabs (Clopidogrel bisulfate) .Marland Kitchen... Take 1 tablet by mouth once a day    Altace 5 Mg Caps (Ramipril) .Marland Kitchen... Take 2 capsule by mouth once daily    Aspirin 325 Mg Tabs (Aspirin) .Marland Kitchen... Take 1 tablet by mouth once a day    Amlodipine Besylate 5 Mg Tabs (Amlodipine besylate) .Marland Kitchen... Take 1 tablet by mouth once a day    Toprol Xl 50 Mg Tb24 (Metoprolol succinate) .Marland Kitchen... Take 1 tablet by mouth once a day  Orders: T-CBC No Diff ([id number])  Problem #  3:  SHOULDER PAIN, RIGHT (ICD-719.41) probable mild rotator cuff tendinitis rest  reviewed ROM exercises. Patient advised to call office if symptoms persist or worsen.  His updated medication list for this problem includes:    Aspirin 325 Mg Tabs (Aspirin) .Marland Kitchen... Take 1 tablet by mouth once a day    Vicodin 5-500 Mg Tabs (Hydrocodone-acetaminophen) ..... One tablet by mouth every 12hours as needed  Complete Medication List: 1)  Plavix 75 Mg Tabs (Clopidogrel bisulfate) .... Take 1 tablet by mouth once a day 2)  Lipitor 20 Mg Tabs (Atorvastatin calcium) .... Take 1 tablet by mouth once a day at bedtime 3)  Altace 5 Mg Caps (Ramipril) .... Take 2 capsule by mouth once daily 4)  Aspirin 325 Mg Tabs (Aspirin) .... Take 1 tablet by mouth once a day 5)  Colchicine 0.6 Mg Tabs (Colchicine) .... One by mouth once daily as needed 6)  Zantac 75 75 Mg Tabs (Ranitidine hcl) .... Take 1 tablet by mouth once a day 7)  Vicodin 5-500 Mg Tabs  (Hydrocodone-acetaminophen) .... One tablet by mouth every 12hours as needed 8)  Amlodipine Besylate 5 Mg Tabs (Amlodipine besylate) .... Take 1 tablet by mouth once a day 9)  Allopurinol 300 Mg Tabs (Allopurinol) .... Take 1 tablet by mouth once a day 10)  Toprol Xl 50 Mg Tb24 (Metoprolol succinate) .... Take 1 tablet by mouth once a day 11)  Metformin Hcl 500 Mg Tabs (Metformin hcl) .... Take 1 tablet by mouth two times a day 12)  Pantoprazole Sodium 40 Mg Tbec (Pantoprazole sodium) .... One by mouth once daily 13)  Desoximetasone 0.25 % Crea (Desoximetasone) .... Apply two times a day as directed 14)  Cefuroxime Axetil 500 Mg Tabs (Cefuroxime axetil) .... One by mouth two times a day  Other Orders: T-Basic Metabolic Panel [id number]) T- Hemoglobin A1C ([id number]) T-Urine Microalbumin w/creat. ratio [phone])  Patient Instructions: 1)  Please schedule a follow-up appointment in 4 months. Prescriptions: PANTOPRAZOLE SODIUM 40 MG TBEC (PANTOPRAZOLE SODIUM) one by mouth once daily  #90 x 1   Entered and Authorized by:   [name]   Signed by:   [name] on 03/20/2010   Method used:   Print then Give to Patient   RxID:   [id number] METFORMIN HCL 500 MG TABS (METFORMIN HCL) Take 1 tablet by mouth two times a day  #180 x 1   Entered and Authorized by:   [name]   Signed by:   [name] on 03/20/2010   Method used:   Print then Give to Patient   RxID:   [id number] TOPROL XL 50 MG  TB24 (METOPROLOL SUCCINATE) Take 1 tablet by mouth once a day  #90 x 1   Entered and Authorized by:   [name]   Signed by:   [name] on 03/20/2010   Method used:   Print then Give to Patient   RxID:   [id number] ALLOPURINOL 300 MG  TABS (ALLOPURINOL) Take 1 tablet by mouth once a day  #90 x 1   Entered and Authorized by:   [name]   Signed by:   [name] on 03/20/2010   Method used:   Print then Give to Patient    RxID:   [id number] AMLODIPINE BESYLATE 5 MG  TABS (AMLODIPINE BESYLATE) Take 1 tablet by mouth once a day  #90 Tablet x 1  Entered and Authorized by:   [name]   Signed by:   [name] on 03/20/2010   Method used:   Print then Give to Patient   RxID:   [id number] ALTACE 5 MG  CAPS (RAMIPRIL) Take 2 capsule by mouth once daily  #180 Capsule x 1   Entered and Authorized by:   [name]   Signed by:   [name] on 03/20/2010   Method used:   Print then Give to Patient   RxID:   [id number] LIPITOR 20 MG  TABS (ATORVASTATIN CALCIUM) Take 1 tablet by mouth once a day at bedtime  #90 Tablet x 1   Entered and Authorized by:   [name]   Signed by:   [name] on 03/20/2010   Method used:   Print then Give to Patient   RxID:   [id number] PLAVIX 75 MG  TABS (CLOPIDOGREL BISULFATE) Take 1 tablet by mouth once a day  #90 x 1   Entered and Authorized by:   [name]   Signed by:   [name] on 03/20/2010   Method used:   Print then Give to Patient   RxID:   [id number] VICODIN 5-500 MG  TABS (HYDROCODONE-ACETAMINOPHEN) one tablet by mouth every 12hours as needed  #45 x 3   Entered and Authorized by:   [name]   Signed by:   [name] on 03/20/2010   Method used:   Print then Give to Patient   RxID:   [id number] CEFUROXIME AXETIL 500 MG TABS (CEFUROXIME AXETIL) one by mouth two times a day  #28 x 0   Entered and Authorized by:   [name]   Signed by:   [name] on 03/20/2010   Method used:   [facility]  [place] [address]       [address]       [place], [place]  [address]       Ph: [phone] or [phone]       Fax: [phone]   RxID:   [id number]    Orders Added: 1)  T-Basic Metabolic Panel [phone] 2)  T- Hemoglobin A1C [[id number]] 3)  T-CBC No Diff [[id number]] 4)  T-Urine Microalbumin w/creat. ratio  [[id number]] 5)  Est. Patient Level III [[address]]    Current Allergies (reviewed today): ! CODEINE ! JANUVIA (SITAGLIPTIN PHOSPHATE) TESSALON PERLES (BENZONATATE)

## 2010-07-03 ENCOUNTER — Telehealth: Payer: Self-pay | Admitting: Internal Medicine

## 2010-07-03 MED ORDER — HYDROCODONE-ACETAMINOPHEN 5-500 MG PO TABS: 1.0000 | ORAL_TABLET | Freq: Two times a day (BID) | ORAL | Status: DC | PRN

## 2010-07-03 NOTE — Telephone Encounter (Signed)
[facility] [place] [facility]

## 2010-07-03 NOTE — Telephone Encounter (Signed)
Refill- hydrocodon-acetaminophen 5-500. Take 1 tablet by mouth every 12 hours as needed. Qty 45. Last fill 3.16.12

## 2010-07-03 NOTE — Telephone Encounter (Signed)
Ok to refill x 1  

## 2010-07-14 ENCOUNTER — Encounter: Payer: Self-pay | Admitting: Internal Medicine

## 2010-07-15 ENCOUNTER — Ambulatory Visit: Payer: Self-pay | Admitting: Internal Medicine

## 2010-07-18 NOTE — Assessment & Plan Note (Signed)
[facility]                              CARDIOLOGY OFFICE NOTE   QUASIM, DOYON                    MRN:          [id number]  DATE:12/16/2005                            DOB:          Sep 16, 1945    Kevin Mcclain is a pleasant 64 year old male with a past medical history  of coronary artery disease status post coronary artery bypassing graft,  hypertension, and diabetes mellitus who presents for establishment.  The  patient suffered a myocardial infarction approximately 7 years ago.  In 2005  he underwent coronary artery bypassing graft following a cardiac  catheterization that revealed severe coronary disease.  Note his presurgical  ejection fraction was 51% but a transesophageal echocardiogram at the time  of surgery showed an ejection fraction of 40%.  Note he had five grafts  placed.  Since that time the patient has done well.  He denies any dyspnea  on exertion, orthopnea, PND, pedal edema, palpitations, presyncope, syncope,  or chest pain.  Note his anginal equivalent was apparently left arm  discomfort described as a burning sensation with exertion.  He has not had  any of that since his surgery.   MEDICATIONS AT PRESENT:  1. Aspirin 325 mg p.o. daily.  2. Imdur 20 mg p.o. b.i.d.  3. Toprol-XL 50 mg p.o. daily.  4. Plavix 75 mg p.o. daily.  5. Metformin 500 mg p.o. b.i.d.  6. Altace 5 mg p.o. daily.  7. Lipitor 20 mg p.o. daily.   He is allergic to CODEINE.   SOCIAL HISTORY:  He does not smoke and he does not consume alcohol.   FAMILY HISTORY:  Strongly positive for coronary artery disease.   PAST MEDICAL HISTORY:  Significant for diabetes mellitus and he also has  hypertension.  He denies any hyperlipidemia but he is on Lipitor.  He has  had coronary artery disease status post coronary artery bypassing graft.  He  has also had nephrolithiasis.  He has prior surgery on his right shoulder.  He also has arthritis and gout.   REVIEW OF SYSTEMS:  He denies any headaches or fevers or chills.  There is  no productive cough or hemoptysis.  There is no dysphagia, odynophagia,  melena, or hematochezia.  There is no dysuria or hematuria.  There is no  rash or seizure activity.  There is no orthopnea, PND, or pedal edema.  There is no claudication noted.  The remaining systems are negative.   PHYSICAL EXAMINATION TODAY:  VITAL SIGNS:  Show a blood pressure of 148/70  and his pulse is 72.  He weighs 156 pounds.  GENERAL:  He is well-developed and well-nourished, no acute distress.  His  skin is warm and dry.  He does not appear to be depressed and there is no  peripheral clubbing.  HEENT:  Unremarkable with normal eyelids.  NECK:  Supple with a normal upstroke bilaterally and I cannot appreciate  bruits.  There is no jugular venous distention and no thyromegaly is noted.  CHEST:  Clear to auscultation with normal expansion.  CARDIOVASCULAR:  Reveals a regular rate and rhythm, normal S1  and S2.  There  are no murmurs, rubs, or gallops noted.  ABDOMEN:  Not tender or distended, positive bowel sounds, no  hepatosplenomegaly, no mass appreciated.  There was no abdominal bruit.  There is no evidence of a pulsatile mass.  He has 2+ femoral pulses  bilaterally and no bruits.  EXTREMITIES:  Show no edema and I can palpate no cords.  He has 2+ posterior  tibial pulses bilaterally.  NEUROLOGIC:  Grossly intact.   His electrocardiogram shows a normal sinus rhythm at a rate of 72.  The axis  is normal.  There is poor R-wave progression and a prior anterior infarct  cannot be excluded.  There are nonspecific ST changes.   DIAGNOSES:  1. Coronary artery disease status post coronary artery bypassing graft.  2. Hypertension.  3. History of diabetes mellitus.  4. History of gout.  5. History of nephrolithiasis.   PLAN:  Kevin Mcclain is doing well from a symptomatic standpoint with no  chest pain or shortness of breath  approximately 2 years following his  coronary artery bypassing graft.  I think it is reasonable to discontinue  his Imdur and we will increase his Altace to 10 mg p.o. daily as his blood  pressure will most likely rise off of the Imdur.  I will check a CMET and  lipids in 1 week.  We will adjust his Lipitor with a goal LDL of less than  70 given his history of coronary disease.  We discussed discontinuing his  Plavix but he would prefer to remain on that medication.  In light of that,  I have asked him to decrease his aspirin to 81 mg p.o. daily.  We will check  an echocardiogram to quantify his left ventricular function.  Otherwise, we  discussed the importance of risk factor modification including diet and  exercise.  Note he does not smoke.  We will see him back in approximately 12  months.            ______________________________  Madolyn Frieze. Jens Som, MD, [facility]      BSC/MedQ  DD:  12/16/2005  DT:  12/17/2005  Job #:  [id number]

## 2010-07-25 ENCOUNTER — Ambulatory Visit: Payer: Self-pay | Admitting: Internal Medicine

## 2010-08-01 ENCOUNTER — Telehealth: Payer: Self-pay | Admitting: Internal Medicine

## 2010-08-01 MED ORDER — HYDROCODONE-ACETAMINOPHEN 5-500 MG PO TABS: 1.0000 | ORAL_TABLET | Freq: Two times a day (BID) | ORAL | Status: DC | PRN

## 2010-08-01 NOTE — Telephone Encounter (Signed)
Rx refill called to [place] with [facility]

## 2010-08-01 NOTE — Telephone Encounter (Signed)
Refill- hydrocodon-acetaminophen 5-500. Take 1 tablet by mouth every 12 hours as needed. Qty 45. Last fill 5.3.12

## 2010-08-01 NOTE — Telephone Encounter (Signed)
OK to give #45 no refills.

## 2010-08-11 ENCOUNTER — Encounter: Payer: Self-pay | Admitting: Family

## 2010-08-11 ENCOUNTER — Ambulatory Visit (INDEPENDENT_AMBULATORY_CARE_PROVIDER_SITE_OTHER): Payer: Medicare FFS | Admitting: Family

## 2010-08-11 DIAGNOSIS — J329 Chronic sinusitis, unspecified: Secondary | ICD-10-CM

## 2010-08-11 MED ORDER — METFORMIN HCL 500 MG PO TABS: 500.0000 mg | ORAL_TABLET | Freq: Two times a day (BID) | ORAL | Status: DC

## 2010-08-11 MED ORDER — CLOPIDOGREL BISULFATE 75 MG PO TABS: 75.0000 mg | ORAL_TABLET | Freq: Every day | ORAL | Status: DC

## 2010-08-11 MED ORDER — METOPROLOL SUCCINATE ER 50 MG PO TB24: 50.0000 mg | ORAL_TABLET | Freq: Every day | ORAL | Status: DC

## 2010-08-11 MED ORDER — RAMIPRIL 5 MG PO TABS: 5.0000 mg | ORAL_TABLET | Freq: Every day | ORAL | Status: DC

## 2010-08-11 MED ORDER — ALLOPURINOL 300 MG PO TABS: 300.0000 mg | ORAL_TABLET | Freq: Every day | ORAL | Status: DC

## 2010-08-11 MED ORDER — CEFUROXIME AXETIL 500 MG PO TABS: 500.0000 mg | ORAL_TABLET | Freq: Two times a day (BID) | ORAL | Status: AC

## 2010-08-11 MED ORDER — AMLODIPINE BESYLATE 5 MG PO TABS: 5.0000 mg | ORAL_TABLET | Freq: Every day | ORAL | Status: DC

## 2010-08-11 MED ORDER — ATORVASTATIN CALCIUM 20 MG PO TABS: 20.0000 mg | ORAL_TABLET | Freq: Every day | ORAL | Status: DC

## 2010-08-11 MED ORDER — CHLORPHENIRAMINE-HYDROCODONE 8-10 MG/5ML PO LQCR: 5.0000 mL | Freq: Two times a day (BID) | ORAL | Status: DC | PRN

## 2010-08-11 NOTE — Assessment & Plan Note (Signed)
Will treat with ceftin 

## 2010-08-11 NOTE — Patient Instructions (Addendum)
Please call if your symptoms worsen or if they are not improved in 48 to 72 hours.  Go to [facility] fasting this week to complete your lab work.

## 2010-08-11 NOTE — Progress Notes (Signed)
Subjective:    Patient ID: Kevin Mcclain, male    DOB: 04-08-1945, 65 y.o.   MRN: [id number]  HPI  Mr. Carver is a 65 yr old male who presents today with chief complaint today of sinus pain and pressure.  + post-nasal drainage (green/yellow/blood tinged).  R ear pain/right neck pain.  Reports + fever (subjective) temp at night.  + cough worse when he lays flat. Has not taken anything OTC.  Energy is poor, appetite is fair.     Review of Systems See HPI  Past Medical History  Diagnosis Date  . Depression   . Arthritis   . Diabetes mellitus type II   . Kidney stones   . CAD (coronary artery disease)     severe CAD-s/p MI 2000 with PTCA, s/p CABG 2005-by Dr Nathaniel Man at [facility] Last EF 50-55%  . Hypertension   . Hyperlipidemia   . Gout   . Vertigo     History   Social History  . Marital Status: Married    Spouse Name: N/A    Number of Children: N/A  . Years of Education: N/A   Occupational History  . Not on file.   Social History Main Topics  . Smoking status: Never Smoker   . Smokeless tobacco: Not on file  . Alcohol Use: Not on file  . Drug Use: Not on file  . Sexually Active: Not on file   Other Topics Concern  . 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 Coronary artery bypass graft 2005    5 grafts    Family History  Problem Relation Age of Onset  . Heart attack Mother     deceased age 46 secondary  . Colon cancer Father   . Stroke Father     deceased age 90  . Stroke Sister 78  . Diabetes type II Sister   . Coronary artery disease Brother     s/p bypass surgery    Allergies  Allergen Reactions  . Benzonatate   . Codeine   . Sitagliptin Phosphate     Current Outpatient Prescriptions on File Prior to Visit  Medication Sig Dispense Refill  . aspirin 325 MG tablet Take 325 mg by mouth  daily.        . colchicine 0.6 MG tablet Take 0.6 mg by mouth daily as needed.       . desoximetasone (TOPICORT) 0.25 % cream Apply topically 2 (two) times daily.        Marland Kitchen HYDROcodone-acetaminophen (VICODIN) 5-500 MG per tablet Take 1 tablet by mouth every 12 (twelve) hours as needed for pain.  45 tablet  0  . ranitidine (ZANTAC) 75 MG tablet Take 75 mg by mouth at bedtime.        Marland Kitchen DISCONTD: allopurinol (ZYLOPRIM) 300 MG tablet Take 300 mg by mouth daily.        Marland Kitchen DISCONTD: amLODipine (NORVASC) 5 MG tablet Take 5 mg by mouth daily.        Marland Kitchen DISCONTD: atorvastatin (LIPITOR) 20 MG tablet Take 20 mg by mouth at bedtime.        Marland Kitchen DISCONTD: clopidogrel (PLAVIX) 75 MG tablet Take 75 mg by mouth daily.        Marland Kitchen DISCONTD: metFORMIN (GLUCOPHAGE) 500 MG tablet Take 500  mg by mouth 2 (two) times daily with a meal.        . DISCONTD: metoprolol (TOPROL-XL) 50 MG 24 hr tablet Take 50 mg by mouth daily.        Marland Kitchen DISCONTD: ramipril (ALTACE) 5 MG tablet Take 5 mg by mouth. Take 2 tablets by once daily       . DISCONTD: pantoprazole (PROTONIX) 40 MG tablet Take 40 mg by mouth daily.          BP 134/80  Pulse 84  Temp(Src) 98 F (36.7 C) (Oral)  Resp 16  Ht 5\' 4"  (1.626 m)       Objective:   Physical Exam  Constitutional: He appears well-developed and well-nourished.  HENT:  Right Ear: Tympanic membrane and ear canal normal.  Left Ear: Tympanic membrane and ear canal normal.  Mouth/Throat: Uvula is midline and oropharynx is clear and moist.       + maxillary and frontal sinus tenderness to palpation.  Cardiovascular: Normal rate and regular rhythm.   Pulmonary/Chest: Effort normal and breath sounds normal.  Psychiatric: He has a normal mood and affect. His behavior is normal. Judgment and thought content normal.          Assessment & Plan:

## 2010-08-13 ENCOUNTER — Other Ambulatory Visit: Payer: Medicare FFS

## 2010-08-14 ENCOUNTER — Ambulatory Visit: Payer: Self-pay | Admitting: Internal Medicine

## 2010-08-27 ENCOUNTER — Emergency Department (HOSPITAL_COMMUNITY)
Admission: EM | Admit: 2010-08-27 | Discharge: 2010-08-28 | Disposition: A | Discharge status: Home / Self Care | Payer: Medicare FFS | Attending: Emergency Medicine | Admitting: Emergency Medicine

## 2010-08-27 ENCOUNTER — Emergency Department (HOSPITAL_COMMUNITY): Payer: Medicare FFS

## 2010-08-27 DIAGNOSIS — I10 Essential (primary) hypertension: Secondary | ICD-10-CM | POA: Insufficient documentation

## 2010-08-27 DIAGNOSIS — Z951 Presence of aortocoronary bypass graft: Secondary | ICD-10-CM | POA: Insufficient documentation

## 2010-08-27 DIAGNOSIS — R112 Nausea with vomiting, unspecified: Secondary | ICD-10-CM | POA: Insufficient documentation

## 2010-08-27 DIAGNOSIS — R1013 Epigastric pain: Secondary | ICD-10-CM | POA: Insufficient documentation

## 2010-08-27 DIAGNOSIS — I2510 Atherosclerotic heart disease of native coronary artery without angina pectoris: Secondary | ICD-10-CM | POA: Insufficient documentation

## 2010-08-27 DIAGNOSIS — Z7982 Long term (current) use of aspirin: Secondary | ICD-10-CM | POA: Insufficient documentation

## 2010-08-27 DIAGNOSIS — E119 Type 2 diabetes mellitus without complications: Secondary | ICD-10-CM | POA: Insufficient documentation

## 2010-08-27 DIAGNOSIS — Z79899 Other long term (current) drug therapy: Secondary | ICD-10-CM | POA: Insufficient documentation

## 2010-08-27 DIAGNOSIS — K8020 Calculus of gallbladder without cholecystitis without obstruction: Secondary | ICD-10-CM | POA: Insufficient documentation

## 2010-08-27 LAB — DIFFERENTIAL
Basophils Relative: 0 % (ref 0–1)
Eosinophils Absolute: 0.2 10*3/uL (ref 0.0–0.7)
Neutrophils Relative %: 79 % — ABNORMAL HIGH (ref 43–77)

## 2010-08-27 LAB — BASIC METABOLIC PANEL
CO2: 28 mEq/L (ref 19–32)
Calcium: 9.8 mg/dL (ref 8.4–10.5)
Chloride: 98 mEq/L (ref 96–112)
Sodium: 138 mEq/L (ref 135–145)

## 2010-08-27 LAB — CBC
MCH: 31.1 pg (ref 26.0–34.0)
Platelets: 269 10*3/uL (ref 150–400)
RBC: 4.85 MIL/uL (ref 4.22–5.81)
WBC: 16 10*3/uL — ABNORMAL HIGH (ref 4.0–10.5)

## 2010-08-27 LAB — CK TOTAL AND CKMB (NOT AT ARMC): Relative Index: 1.5 (ref 0.0–2.5)

## 2010-08-27 LAB — HEPATIC FUNCTION PANEL
AST: 35 U/L (ref 0–37)
Bilirubin, Direct: 0.2 mg/dL (ref 0.0–0.3)

## 2010-08-27 LAB — OCCULT BLOOD, POC DEVICE: Fecal Occult Bld: NEGATIVE

## 2010-08-27 LAB — TROPONIN I: Troponin I: <0.3 ng/mL (ref <0.30)

## 2010-08-27 LAB — LIPASE, BLOOD: Lipase: 34 U/L (ref 11–59)

## 2010-08-28 LAB — LACTIC ACID, PLASMA: Lactic Acid, Venous: 1.8 mmol/L (ref 0.5–2.2)

## 2010-08-28 MED ORDER — IOHEXOL 300 MG/ML  SOLN
100.0000 mL | Freq: Once | INTRAMUSCULAR | Status: AC | PRN
  Administered 2010-08-28: 100 mL via INTRAVENOUS

## 2010-08-29 ENCOUNTER — Telehealth: Payer: Self-pay | Admitting: Family

## 2010-08-29 ENCOUNTER — Inpatient Hospital Stay (HOSPITAL_COMMUNITY)
Admission: EM | Admit: 2010-08-29 | Discharge: 2010-09-05 | DRG: 417 | Disposition: A | Discharge status: Home / Self Care | Payer: Medicare FFS | Attending: Internal Medicine | Admitting: Internal Medicine

## 2010-08-29 DIAGNOSIS — I10 Essential (primary) hypertension: Secondary | ICD-10-CM | POA: Diagnosis present

## 2010-08-29 DIAGNOSIS — M109 Gout, unspecified: Secondary | ICD-10-CM | POA: Diagnosis present

## 2010-08-29 DIAGNOSIS — I252 Old myocardial infarction: Secondary | ICD-10-CM

## 2010-08-29 DIAGNOSIS — K8000 Calculus of gallbladder with acute cholecystitis without obstruction: Principal | ICD-10-CM | POA: Diagnosis present

## 2010-08-29 DIAGNOSIS — E785 Hyperlipidemia, unspecified: Secondary | ICD-10-CM | POA: Diagnosis present

## 2010-08-29 DIAGNOSIS — E876 Hypokalemia: Secondary | ICD-10-CM | POA: Diagnosis not present

## 2010-08-29 DIAGNOSIS — E119 Type 2 diabetes mellitus without complications: Secondary | ICD-10-CM | POA: Diagnosis present

## 2010-08-29 DIAGNOSIS — Z7982 Long term (current) use of aspirin: Secondary | ICD-10-CM

## 2010-08-29 DIAGNOSIS — I5031 Acute diastolic (congestive) heart failure: Secondary | ICD-10-CM | POA: Diagnosis not present

## 2010-08-29 DIAGNOSIS — J9600 Acute respiratory failure, unspecified whether with hypoxia or hypercapnia: Secondary | ICD-10-CM | POA: Diagnosis not present

## 2010-08-29 DIAGNOSIS — R0902 Hypoxemia: Secondary | ICD-10-CM | POA: Diagnosis not present

## 2010-08-29 DIAGNOSIS — K8020 Calculus of gallbladder without cholecystitis without obstruction: Secondary | ICD-10-CM

## 2010-08-29 DIAGNOSIS — Z951 Presence of aortocoronary bypass graft: Secondary | ICD-10-CM

## 2010-08-29 DIAGNOSIS — Z79899 Other long term (current) drug therapy: Secondary | ICD-10-CM

## 2010-08-29 DIAGNOSIS — Z87442 Personal history of urinary calculi: Secondary | ICD-10-CM

## 2010-08-29 DIAGNOSIS — Z7902 Long term (current) use of antithrombotics/antiplatelets: Secondary | ICD-10-CM

## 2010-08-29 DIAGNOSIS — I2510 Atherosclerotic heart disease of native coronary artery without angina pectoris: Secondary | ICD-10-CM | POA: Diagnosis present

## 2010-08-29 DIAGNOSIS — I509 Heart failure, unspecified: Secondary | ICD-10-CM | POA: Diagnosis not present

## 2010-08-29 NOTE — Telephone Encounter (Signed)
Spoke with Dr. Stan Head re: pt's recent trip to the ED with symptomatic cholelithiasis.  He is requesting surgical referral and cardiac clearance.  Will arrange.

## 2010-08-30 ENCOUNTER — Emergency Department (HOSPITAL_COMMUNITY): Payer: Medicare FFS

## 2010-08-30 DIAGNOSIS — I2510 Atherosclerotic heart disease of native coronary artery without angina pectoris: Secondary | ICD-10-CM

## 2010-08-30 DIAGNOSIS — K812 Acute cholecystitis with chronic cholecystitis: Secondary | ICD-10-CM

## 2010-08-30 DIAGNOSIS — Z01810 Encounter for preprocedural cardiovascular examination: Secondary | ICD-10-CM

## 2010-08-30 LAB — COMPREHENSIVE METABOLIC PANEL
AST: 14 U/L (ref 0–37)
Albumin: 3.7 g/dL (ref 3.5–5.2)
Alkaline Phosphatase: 76 U/L (ref 39–117)
Chloride: 95 mEq/L — ABNORMAL LOW (ref 96–112)
Potassium: 4.1 mEq/L (ref 3.5–5.1)
Sodium: 133 mEq/L — ABNORMAL LOW (ref 135–145)
Total Bilirubin: 1.3 mg/dL — ABNORMAL HIGH (ref 0.3–1.2)
Total Protein: 7 g/dL (ref 6.0–8.3)

## 2010-08-30 LAB — GLUCOSE, CAPILLARY: Glucose-Capillary: 199 mg/dL — ABNORMAL HIGH (ref 70–99)

## 2010-08-30 LAB — DIFFERENTIAL
Basophils Absolute: 0.1 10*3/uL (ref 0.0–0.1)
Basophils Relative: 0 % (ref 0–1)
Eosinophils Absolute: 0.1 10*3/uL (ref 0.0–0.7)
Eosinophils Relative: 1 % (ref 0–5)
Lymphocytes Relative: 12 % (ref 12–46)

## 2010-08-30 LAB — CBC
HCT: 40.9 % (ref 39.0–52.0)
Platelets: 220 10*3/uL (ref 150–400)
RDW: 12.4 % (ref 11.5–15.5)
WBC: 15.6 10*3/uL — ABNORMAL HIGH (ref 4.0–10.5)

## 2010-08-30 LAB — URINALYSIS, ROUTINE W REFLEX MICROSCOPIC
Bilirubin Urine: NEGATIVE
Glucose, UA: 100 mg/dL — AB
Ketones, ur: >80 mg/dL — AB
Leukocytes, UA: NEGATIVE
Specific Gravity, Urine: 1.027 (ref 1.005–1.030)
pH: 5 (ref 5.0–8.0)

## 2010-08-31 LAB — GLUCOSE, CAPILLARY
Glucose-Capillary: 153 mg/dL — ABNORMAL HIGH (ref 70–99)
Glucose-Capillary: 202 mg/dL — ABNORMAL HIGH (ref 70–99)
Glucose-Capillary: 211 mg/dL — ABNORMAL HIGH (ref 70–99)

## 2010-08-31 LAB — CBC
Hemoglobin: 14.1 g/dL (ref 13.0–17.0)
MCH: 31 pg (ref 26.0–34.0)
MCHC: 35.6 g/dL (ref 30.0–36.0)
MCV: 87 fL (ref 78.0–100.0)
RBC: 4.55 MIL/uL (ref 4.22–5.81)

## 2010-08-31 LAB — MRSA PCR SCREENING: MRSA by PCR: NEGATIVE

## 2010-08-31 LAB — COMPREHENSIVE METABOLIC PANEL
BUN: 9 mg/dL (ref 6–23)
CO2: 26 mEq/L (ref 19–32)
Calcium: 8.7 mg/dL (ref 8.4–10.5)
Creatinine, Ser: 0.53 mg/dL (ref 0.50–1.35)
GFR calc Af Amer: >60 mL/min (ref >60)
GFR calc non Af Amer: >60 mL/min (ref >60)
Glucose, Bld: 153 mg/dL — ABNORMAL HIGH (ref 70–99)
Sodium: 132 mEq/L — ABNORMAL LOW (ref 135–145)
Total Protein: 5.7 g/dL — ABNORMAL LOW (ref 6.0–8.3)

## 2010-08-31 NOTE — H&P (Signed)
NAMEGILMORE, LIST           ACCOUNT NO.:  [id number]  MEDICAL RECORD NO.:  [id number]  LOCATION:  5150                         FACILITY:  [facility]  PHYSICIAN:  Almond Lint, MD       DATE OF BIRTH:  09-16-1945  DATE OF ADMISSION:  08/29/2010 DATE OF DISCHARGE:                             HISTORY & PHYSICAL   CHIEF COMPLAINT:  Right upper quadrant abdominal pain.  HISTORY OF PRESENT ILLNESS:  Mr. Tonkinson is a 64 year old male with 4 days of abdominal pain, seen in the emergency department several days ago.  At that point, he was diagnosed with gallstones by CT scan and received IV pain medication and made the pain go away.  Within 24 hours, he started having nagging discomfort again, but it was not severe as before.  Yesterday around 5 p.m., he had [place] sandwich and watermelon and this made the pain recur more severe than the last time.  He has been unable to have relief with IV pain medication.  He had nausea and vomiting on last Wednesday, but today this has not caused emesis.  He has had some chills and sweats.  It is unclear if he has had fevers.  He felt bloated.  He thinks that he has had some nagging discomfort as well over the last few months, but did not attribute to this.  PAST MEDICAL HISTORY:  Significant for arthritis, coronary artery disease, diabetes, hypertension, and kidney stones.  PAST SURGICAL HISTORY:  CABG and right shoulder surgery.  FAMILY HISTORY:  Sister had a cholecystectomy.  He has 2 brothers that had cardiac surgery and multiple relatives with CVA.  Father with colon cancer and mother with MI.  SOCIAL HISTORY:  Does not abuse substances.  DRUG ALLERGIES:  CODEINE.  MEDICATIONS:  Lipitor, metformin, Percocet, Plavix, and others.  PHYSICAL EXAMINATION:  VITAL SIGNS:  Temperature 98, pulse 86, respiratory rate 16, and blood pressure 145/75. GENERAL:  He is alert and oriented x3, in no acute distress. HEENT:  Normocephalic and  atraumatic.  Sclerae anicteric. NECK:  Supple.  No lymphadenopathy.  No thyromegaly.  Trachea is midline. HEART:  Regular rate and rhythm.  No murmurs, rubs, or gallops. ABDOMEN:  Soft.  Tender in the right upper quadrant and distended.  No fluid wave. EXTREMITIES:  Warm and well perfused without pitting edema. PSYCH:  Mood and affect are normal.  LABORATORY DATA:  UA, positive ketones.  Sodium 133, potassium 4.1, chloride 95, CO2 of 27, BUN 14, creatinine 0.83, and glucose 175. Bilirubin 1.3, AST 14, ALT 14, and alk phos 176.  White count 15.6, hemoglobin 14.4, hematocrit 40.9, and platelet count 220.  Ultrasound shows stones and sludge.  CT scan from earlier this week shows stones.  IMPRESSION:  Mr. Cuff is a 65 year old male with cholecystitis.  I recommend admission to the medical team for diabetes management and cardiac workup, stop Plavix, coronary artery disease management, and cardiac clearance.  He will need gallbladder removed and will need to be off Plavix 3-5 days.     Almond Lint, MD     FB/MEDQ  D:  08/30/2010  T:  08/30/2010  Job:  [id number]  Electronically Signed by Almond Lint MD on  08/31/2010 12:54:35 PM

## 2010-09-01 ENCOUNTER — Inpatient Hospital Stay (HOSPITAL_COMMUNITY): Payer: Medicare FFS

## 2010-09-01 DIAGNOSIS — R932 Abnormal findings on diagnostic imaging of liver and biliary tract: Secondary | ICD-10-CM

## 2010-09-01 DIAGNOSIS — K8309 Other cholangitis: Secondary | ICD-10-CM

## 2010-09-01 DIAGNOSIS — R17 Unspecified jaundice: Secondary | ICD-10-CM

## 2010-09-01 LAB — GLUCOSE, CAPILLARY
Glucose-Capillary: 186 mg/dL — ABNORMAL HIGH (ref 70–99)
Glucose-Capillary: 203 mg/dL — ABNORMAL HIGH (ref 70–99)
Glucose-Capillary: 228 mg/dL — ABNORMAL HIGH (ref 70–99)

## 2010-09-01 LAB — CBC
MCV: 89.1 fL (ref 78.0–100.0)
Platelets: 292 10*3/uL (ref 150–400)
RDW: 12.9 % (ref 11.5–15.5)
WBC: 23.6 10*3/uL — ABNORMAL HIGH (ref 4.0–10.5)

## 2010-09-01 LAB — PLATELET INHIBITION P2Y12
P2Y12 % Inhibition: 0 %
Platelet Function  P2Y12: 241 [PRU] (ref 194–418)
Platelet Function Baseline: 189 [PRU] — ABNORMAL LOW (ref 194–418)

## 2010-09-01 LAB — COMPREHENSIVE METABOLIC PANEL
AST: 53 U/L — ABNORMAL HIGH (ref 0–37)
Albumin: 2.6 g/dL — ABNORMAL LOW (ref 3.5–5.2)
Chloride: 100 mEq/L (ref 96–112)
Creatinine, Ser: 0.63 mg/dL (ref 0.50–1.35)
Sodium: 138 mEq/L (ref 135–145)
Total Bilirubin: 7.1 mg/dL — ABNORMAL HIGH (ref 0.3–1.2)

## 2010-09-02 ENCOUNTER — Other Ambulatory Visit (INDEPENDENT_AMBULATORY_CARE_PROVIDER_SITE_OTHER): Payer: Self-pay | Admitting: Surgery

## 2010-09-02 LAB — GLUCOSE, CAPILLARY
Glucose-Capillary: 206 mg/dL — ABNORMAL HIGH (ref 70–99)
Glucose-Capillary: 196 mg/dL — ABNORMAL HIGH (ref 70–99)
Glucose-Capillary: 169 mg/dL — ABNORMAL HIGH (ref 70–99)

## 2010-09-02 LAB — COMPREHENSIVE METABOLIC PANEL
ALT: 152 U/L — ABNORMAL HIGH (ref 0–53)
AST: 205 U/L — ABNORMAL HIGH (ref 0–37)
Calcium: 8.8 mg/dL (ref 8.4–10.5)
Sodium: 138 mEq/L (ref 135–145)
Total Protein: 5.4 g/dL — ABNORMAL LOW (ref 6.0–8.3)

## 2010-09-02 LAB — POCT I-STAT 4, (NA,K, GLUC, HGB,HCT)
Potassium: 3.1 mEq/L — ABNORMAL LOW (ref 3.5–5.1)
Sodium: 138 mEq/L (ref 135–145)

## 2010-09-02 LAB — CBC
MCH: 30.6 pg (ref 26.0–34.0)
MCHC: 34.6 g/dL (ref 30.0–36.0)
Platelets: 255 10*3/uL (ref 150–400)

## 2010-09-02 NOTE — Consult Note (Signed)
Kevin Mcclain, Kevin Mcclain           ACCOUNT NO.:  [id number]  MEDICAL RECORD NO.:  [id number]  LOCATION:  5150                         FACILITY:  [facility]  PHYSICIAN:  Maisie Fus C. Sandy Haye, MD, FACCDATE OF BIRTH:  12/07/1945  DATE OF CONSULTATION:  08/30/2010 DATE OF DISCHARGE:                                CONSULTATION   PRIMARY CARDIOLOGIST:  Bevelyn Buckles. Bensimhon, MD  PRIMARY CARE PHYSICIAN:  Barbette Hair. Artist Pais, DO  I was asked by Dr. Lavera Guise of the [facility] as well as Dr. Jamey Ripa of [facility] to clear Mr. Stender for cholecystectomy for acute cholecystitis.  HISTORY OF PRESENT ILLNESS:  He is a delightful 65 year old married white male from [place] who has known coronary artery disease.  His son-in-law is Dr. Stan Head, who is one of our gastroenterologists at [facility].  In 2005, he presented with what sounds like exertional angina.  He had a very positive stress test from his history which I do not have any available records from that time.  He was sent to [facility] for cardiac catheterization.  He apparently had severe three-vessel disease and had a peri-catheterization acute MI.  It sounds like an intra-aortic balloon pump was placed and he went to surgery the next morning.  At that time, he had 5 bypass grafts placed per a note from Dr. Jens Som but he remembers 6.  He has had no exertional angina since his bypass surgery.  He has been on good secondary preventative therapy including treatment for his blood pressure, diabetes, hyperlipidemia.  He also takes aspirin and Plavix.  A look back through EPIC, but he has not seen Dr. Gala Romney since this implementation.  He did have a 2-D echocardiogram that is in e-chart on October 29, 2008.  At that time, he had left ventricular function that was preserved with an ejection fraction of 55-60%, hypokinesia of the distal anterior septal myocardium probably from previous bypass and grade 1  diastolic dysfunction.  He had mild aortic insufficiency and mild mitral regurgitation and mildly dilated left atrium.  He presents with postprandial nausea and abdominal pain.  CT and abdominal ultrasound showed stones with sludge.  His white count was elevated at 16,000.  Lipase was normal.  Lactic acid was normal.  Occult blood was negative.  Cardiac markers were negative x1.  He has normal renal function.  On careful history, he has had no exertional angina or ischemic symptoms which he had prior to his bypass surgery.  He is very active, working in the yard, and has no dyspnea on exertion either.  PAST MEDICAL HISTORY:  In addition to above, he has history of hypertension, type 2 diabetes, history of gout, hyperlipidemia, arthritis, and nephrolithiasis.  PAST SURGICAL HISTORY:  Coronary bypass grafting and rotator cuff repair.  MEDICATIONS PRIOR TO ADMISSION: 1. Allopurinol 300 mg a day. 2. Amlodipine 5 mg a day. 3. Aspirin 325 mg a day. 4. Atorvastatin 20 mg at bedtime. 5. Tussionex p.r.n. 6. Plavix 75 mg a day. 7. Colchicine 0.6 mg daily p.r.n. 8. Topicort two times a day. 9. Vicodin p.r.n. 10.Metformin 500 mg b.i.d. 11.Metoprolol 50 mg a day. 12.Ramipril 5 mg a day. 13.Zantac 75 mg a day.  Here in the hospital, he is currently on Unasyn IV, aspirin at 81 mg a day, insulin coverage, metoprolol succinate 50 mg a day, ramipril 5 mg a day, rosuvastatin 10 mg at bedtime, and his allopurinol 300 mg a day.  SOCIAL HISTORY:  He is married, with children.  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  He denies any tobacco, alcohol, or drug use.  REVIEW OF SYSTEMS:  Other than the HPI is negative.  PHYSICAL EXAMINATION:  GENERAL:  He is a very pleasant gentleman, lying in bed, in no acute distress. VITAL SIGNS:  His blood pressure is 147/80, pulse is 90 and sinus rhythm.  EKG shows normal sinus rhythm with poor R wave progression in anterior precordium.  Temperature  is 99, respirations 18, sats 94% on room air. HEENT:  Nonicteric.  PERRLA.  Extraocular movements intact.  Facial symmetry is normal.  Dentition satisfactory. NECK:  Supple.  Carotids upstrokes equal and bilateral without bruits. Thyroid is not enlarged.  Trachea is midline. HEART:  A regular rate and rhythm.  Normal S1, S2. LUNGS:  Clear to auscultation and percussion. ABDOMEN:  Soft with good bowel sounds.  I did not press on his abdomen because of pain. EXTREMITIES:  No cyanosis, clubbing, or edema.  Pulses are intact. NEURO:  Grossly intact.  Affect is normal.  ASSESSMENT:  Stable coronary artery disease status post coronary artery bypass grafting in 2005.  His last ejection fraction was 55%.  He is having no symptoms of angina or ischemic equivalents.  He is cleared at low operative risk for cholecystectomy.  He will have to be off Plavix at least 3-5 days to decrease bleeding risk.  No preoperative studies are necessary from a cardiac standpoint prior to surgery.  His other problems are listed above.  Thank you very much for the consultation.     Rikki Trosper C. Daleen Squibb, MD, [facility]     TCW/MEDQ  D:  08/30/2010  T:  08/31/2010  Job:  [id number] Electronically Signed by Valera Castle MD [facility] on 09/02/2010 10:36:56 AM

## 2010-09-03 DIAGNOSIS — R17 Unspecified jaundice: Secondary | ICD-10-CM

## 2010-09-03 DIAGNOSIS — K8309 Other cholangitis: Secondary | ICD-10-CM

## 2010-09-03 DIAGNOSIS — R932 Abnormal findings on diagnostic imaging of liver and biliary tract: Secondary | ICD-10-CM

## 2010-09-03 LAB — GLUCOSE, CAPILLARY
Glucose-Capillary: 163 mg/dL — ABNORMAL HIGH (ref 70–99)
Glucose-Capillary: 160 mg/dL — ABNORMAL HIGH (ref 70–99)
Glucose-Capillary: 191 mg/dL — ABNORMAL HIGH (ref 70–99)
Glucose-Capillary: 141 mg/dL — ABNORMAL HIGH (ref 70–99)

## 2010-09-03 LAB — COMPREHENSIVE METABOLIC PANEL
ALT: 163 U/L — ABNORMAL HIGH (ref 0–53)
AST: 140 U/L — ABNORMAL HIGH (ref 0–37)
Albumin: 2 g/dL — ABNORMAL LOW (ref 3.5–5.2)
Alkaline Phosphatase: 290 U/L — ABNORMAL HIGH (ref 39–117)
Chloride: 101 mEq/L (ref 96–112)
Potassium: 2.9 mEq/L — ABNORMAL LOW (ref 3.5–5.1)
Sodium: 136 mEq/L (ref 135–145)
Total Protein: 5.2 g/dL — ABNORMAL LOW (ref 6.0–8.3)

## 2010-09-03 LAB — CBC
MCHC: 34.8 g/dL (ref 30.0–36.0)
Platelets: 263 10*3/uL (ref 150–400)
RDW: 13.1 % (ref 11.5–15.5)
WBC: 12.3 10*3/uL — ABNORMAL HIGH (ref 4.0–10.5)

## 2010-09-03 LAB — POTASSIUM: Potassium: 3.2 mEq/L — ABNORMAL LOW (ref 3.5–5.1)

## 2010-09-04 LAB — GLUCOSE, CAPILLARY
Glucose-Capillary: 107 mg/dL — ABNORMAL HIGH (ref 70–99)
Glucose-Capillary: 102 mg/dL — ABNORMAL HIGH (ref 70–99)

## 2010-09-04 LAB — CBC
Hemoglobin: 13 g/dL (ref 13.0–17.0)
MCH: 30.4 pg (ref 26.0–34.0)
MCHC: 34.7 g/dL (ref 30.0–36.0)

## 2010-09-04 LAB — BASIC METABOLIC PANEL
Calcium: 9 mg/dL (ref 8.4–10.5)
GFR calc non Af Amer: >60 mL/min (ref >60)
Sodium: 139 mEq/L (ref 135–145)

## 2010-09-04 LAB — HEPATIC FUNCTION PANEL
Alkaline Phosphatase: 292 U/L — ABNORMAL HIGH (ref 39–117)
Indirect Bilirubin: 1 mg/dL — ABNORMAL HIGH (ref 0.3–0.9)
Total Bilirubin: 1.6 mg/dL — ABNORMAL HIGH (ref 0.3–1.2)
Total Protein: 6.1 g/dL (ref 6.0–8.3)

## 2010-09-05 LAB — CBC
Platelets: 296 10*3/uL (ref 150–400)
RDW: 12.8 % (ref 11.5–15.5)
WBC: 10.7 10*3/uL — ABNORMAL HIGH (ref 4.0–10.5)

## 2010-09-05 LAB — COMPREHENSIVE METABOLIC PANEL
ALT: 136 U/L — ABNORMAL HIGH (ref 0–53)
BUN: 12 mg/dL (ref 6–23)
Calcium: 8.5 mg/dL (ref 8.4–10.5)
Chloride: 104 mEq/L (ref 96–112)
GFR calc Af Amer: >60 mL/min (ref >60)
GFR calc non Af Amer: >60 mL/min (ref >60)
Potassium: 3.3 mEq/L — ABNORMAL LOW (ref 3.5–5.1)
Total Protein: 5.8 g/dL — ABNORMAL LOW (ref 6.0–8.3)

## 2010-09-05 LAB — GLUCOSE, CAPILLARY: Glucose-Capillary: 120 mg/dL — ABNORMAL HIGH (ref 70–99)

## 2010-09-06 NOTE — Discharge Summary (Signed)
Kevin Mcclain, Kevin Mcclain           ACCOUNT NO.:  [id number]  MEDICAL RECORD NO.:  [id number]  LOCATION:  3303                         FACILITY:  [facility]  PHYSICIAN:  Isidor Holts, M.D.  DATE OF BIRTH:  1945/11/01  DATE OF ADMISSION:  08/29/2010 DATE OF DISCHARGE:  09/05/2010                              DISCHARGE SUMMARY   PRIMARY MD:  Barbette Hair. Artist Pais, DO  PRIMARY CARDIOLOGIST:  Dr. Arvilla Meres.  DISCHARGE DIAGNOSES: 1. Acute gangrenous cholecystitis/cholelithiasis. 2. Status post endoscopic retrograde     cholangiopancreatography/sphincterotomy September 01, 2010. 3. Status post laparoscopic cholecystectomy/JP drain September 02, 2010. 4. Acute diastolic heart failure/transient acute hypoxic respiratory     failure, secondary to volume overload. 5. History of coronary artery disease, status post coronary artery     bypass graft. 6. Hypertension. 7. Urolithiasis. 8. Gout. 9. Dyslipidemia. 10.Type 2 diabetes mellitus. 11.History of osteoarthritis.  DISCHARGE MEDICATIONS: 1. Augmentin (875/125) 1 tablet p.o. b.i.d. with meals for 5 days. 2. Glipizide 5 mg p.o. b.i.d. 3. Hydrocodone/APAP (5/325) 1-2 tablets p.o. p.r.n. q.4 hourly for     pain and total of 40 pills have been dispensed. 4. Potassium chloride 20 mEq p.o. daily. 5. Spironolactone 25 mg p.o. daily. 6. Allopurinol 300 mg p.o. daily. 7. Amlodipine 5 mg p.o. daily. 8. Aspirin OTC 325 mg p.o. daily. 9. Atorvastatin 20 mg p.o. nightly. 10.Clopidogrel 75 mg p.o. daily. 11.Colchicine 0.6 mg p.o. p.r.n. daily for 2-3 days during the flare-     ups. 12.Metoprolol XL succinate 50 mg p.o. daily. 13.Nitroglycerin 0.4 mg sublingually p.r.n. q.5 minutes x3 doses for     chest pain. 14.Ramipril 5 mg p.o. daily. 15.Ranitidine 75 mg p.o. p.r.n. daily for acid reflux.  Note:  Hydrocodone/APAP (5/500) has as been discontinued as has metformin 500 mg p.o. b.i.d., metformin was discontinued secondary to  abnormal LFTs.  PROCEDURES: 1. Abdominal/pelvic CT scan August 28, 2010, this showed cholelithiasis.     Bilateral renal cyst, tiny nonobstructive stones versus vascular     calcifications in the left kidney. 2. Abdominal ultrasound scan August 30, 2010.  This showed     cholelithiasis with gallbladder sludge, no bile duct dilatation. 3. Chest x-ray September 01, 2010.  This showed cardiac enlargement without     evidence of heart failure.  There was chronic asymmetric elevation     of the right hemidiaphragm also bibasilar atelectasis. 4. ERCP done September 01, 2010.  This was performed by Dr. Rob Bunting     has showed bile debris and ductal bile delivered into the duodenum     following biliary sphincterectomy, but no other gallstones or stone     fragments.  The main CBD was slightly narrowed, very cheesy-type     mechanism.  Cystic duct was patent. 5. Laparoscopic cholecystectomy with placement of JP drain September 02, 2010, by Dr. Magnus Ivan  CONSULTATIONS: 1. Dr. Almond Lint, General Surgeon. 2. Dr. Abigail Miyamoto, General Surgeon. 3. Dr. Valera Castle, [place] Cardiologist.  ADMISSION HISTORY:  As in H and P notes of August 29, 2010, dictated by Dr. Houston Siren.  However, in brief, this is a 65 year old male, with known history of coronary artery disease status post  previous MI status post CABG, hypertension, type 2 diabetes mellitus, gout, dyslipidemia, osteoarthritis, urolithiasis, presenting with recurrent right upper quadrant pain which occurred usually, postprandially.  He was evaluated in the emergency department and was found to have WCC of 15.6. Abdominal ultrasound scan showed cholelithiasis with no ductal dilatation and the presence of sludge.  He was subsequently admitted for further evaluation, investigation and management.  CLINICAL COURSE: 1. Cholelithiasis/acute gangrenous cholecystitis.  The patient     presented as described above.  He was commenced on broad-spectrum      antibiotic coverage with Unasyn.  Abdominal ultrasound scan was     done, as well as abdominal/pelvic CT scan.  For details of findings,     refer to procedure list above.  Initial consultation was kindly     provided by Dr. Almond Lint, who opined that the patient will     require cholecystectomy.  The patient underwent ERCP on September 01, 2010, by Dr. Rob Bunting, gastroenterologist.  This demonstrated     bile duct sludge, but no organized stones.  For details of     findings, refer to procedure list above.  The patient subsequently     underwent laparoscopic cholecystectomy on September 02, 2010, in an     uncomplicated procedure, performed by Dr. Magnus Ivan.  Gallbladder was     found to be gangrenous, a JP drain was placed in situ, antibiotics     were continued.  Postoperatively, the patient has done well with     steady diminution of white cell count to 10.7 on September 05, 2010.  The     patient was allowed clear liquids as of September 03, 2010.  That has     been gradually advanced and as of September 05, 2010, he was able to     tolerate a regular diet.  2. Acute diastolic congestive heart failure/acute hypoxic respiratory     failure.  The patient on the evening of September 02, 2010, was     saturating in the low 80s, was short of breath, had palpitations     but had no chest pain.  Clinically, he appeared to be in volume     overload/congestive heart failure.  The cardiologist was very     helpful both in clearing the patient for surgery and addressing his     fluid overload/congestive heart failure.  With judicious use of     diuretics and restriction of fluid administration, the patient     quickly recovered and as of September 03, 2010, respiratory status had     improved.  We were subsequently able to gradually wean the patient     off supplemental oxygen.  By September 05, 2010, he was saturating at 95%     on room air and clinically showed no evidence of congestive heart     failure.  3. Coronary artery  disease.  The patient is status post previous CABG.     He showed no evidence of acute coronary syndrome during this     hospitalization.  4. Hypertension.  This was controlled with a combination of beta-     blockade, and calcium channel antagonist.  As of September 05, 2010, the     patient has been reinstated on his preadmission ACE inhibitor.  5. Type 2 diabetes mellitus.  This was controlled with appropriate     diet and sliding scale insulin coverage.  The patient remained  euglycemic.  However, because of his abnormal LFTs, metformin has     been discontinued and the patient placed on glipizide.  Of note on     September 05, 2010, alkaline phosphatase was 258, AST 118, ALT 136.  6. Dyslipidemia.  The patient continues on statin treatment.  7. History of urolithiasis.  This did not prove problematic.  8. Gout.  There were no symptoms referable to this.  DISPOSITION:  The patient as of September 05, 2010, was considered clinically stable for discharge.  He has been cleared by both the Cardiology and surgical teams for discharge.  As there were no new issues and he was clinically stable, he was discharged accordingly.  ACTIVITY:  As tolerated.  Recommended to increase activity slowly.  DIET:  Heart-healthy/carbohydrate modified.  FOLLOWUP INSTRUCTIONS:  The patient is to follow up routinely with his primary MD, Dr. Artist Pais.  He is also to follow up with his primary cardiologist Dr. Arvilla Meres routinely, per prior scheduled appointment and follow up with surgeon Dr. Magnus Ivan on September 09, 2010, at 9:30 a.m.  Drain will be removed at that time.  WOUND CARE:  Clean with soap and water.  Keep drain site clean and dry.  All this has been communicated to the patient.  He verbalized understanding.     Isidor Holts, M.D.     CO/MEDQ  D:  09/05/2010  T:  09/06/2010  Job:  [id number]  cc:   Barbette Hair. Artist Pais, DO Bevelyn Buckles. Bensimhon, MD Abigail Miyamoto, M.D.  Electronically Signed by  Isidor Holts M.D. on 09/06/2010 03:35:38 PM

## 2010-09-06 NOTE — Op Note (Signed)
Mcclain, Kevin           ACCOUNT NO.:  [id number]  MEDICAL RECORD NO.:  [id number]  LOCATION:  3303                         FACILITY:  [facility]  PHYSICIAN:  Abigail Miyamoto, M.D. DATE OF BIRTH:  [birth year]  DATE OF PROCEDURE:  09/02/2010 DATE OF DISCHARGE:                              OPERATIVE REPORT   PREOPERATIVE DIAGNOSIS:  Acute cholecystitis.  POSTOPERATIVE DIAGNOSIS:  Gangrenous cholecystitis.  PROCEDURE:  Laparoscopic cholecystectomy.  SURGEON:  Abigail Miyamoto, MD  ANESTHESIA:  General endotracheal anesthesia.  ESTIMATED BLOOD LOSS:  Minimal.  INDICATIONS:  Kevin Mcclain is a 65 year old gentleman who presented with right upper quadrant abdominal pain and findings consistent with acute cholecystitis and cholelithiasis and he was on Plavix.  Because of his persistent elevated liver function tests, an ERCP was performed. This showed no evidence of obstructing stone.  Decision was made to proceed with cholecystectomy.  FINDINGS:  The patient was found to have a completely gangrenous gallbladder consistent with gangrenous cholecystitis and gallstones.  PROCEDURE IN DETAIL:  The patient was brought to the operating room and identified as Kevin Mcclain.  He was placed supine on the operative table and general anesthesia was induced.  His abdomen was then prepped and draped in the usual sterile fashion.  Using a #15 blade, a small incision was made just above the umbilicus.  This was carried down to fascia which was opened with the scalpel.  I incorporated a small umbilical hernia defect in the fascial opening.  I then placed a 0 Vicryl purse-string suture around the fascial opening.  This stump was placed through the opening and insufflation of the abdomen was begun.  A 5-mm port was placed in the patient's epigastrium, two more on the right upper quadrant, all under direct vision.  The omentum was stuck to the right upper quadrant including the liver  and the gallbladder.  I was able to remove this with blunt dissection.  The gallbladder was distended and gangrenous.  I had to aspirate bile in order to facilitate grasping the gallbladder.  It was very necrotic throughout all aspects of the gallbladder.  I was able to grasp, however, and retracted above the liver bed.  Dissection was then carried out at the base of the gallbladder.  I identified the cystic duct and achieved a critical window around it.  The cystic duct itself appeared gangrenous.  I was able to place several surgical clips proximal and distal on the cystic duct and transected with the scissors.  I then identified the cystic artery and clipped it proximally and distal and transected as well.  The gallbladder was slowly dissected free from liver bed with electrocautery.  Once this was free from liver bed, it was placed in Endosac and removed through the incision at the umbilicus.  I again evaluated the liver bed and hemostasis appeared to be achieved.  At this point, I irrigated the abdomen with several liters normal saline.  I then placed a 19-French Blake drain through the epigastric port and pulled it out the most lateral port site.  I then placed the drain in the gallbladder fossa.  This was then placed through the skin with a nylon suture.  After irrigating the abdomen  further, hemostasis appeared to be achieved.  All ports were removed under direct vision.  The abdomen was deflated.  The 0 Vicryl at the umbilicus was tied in place closing the fascial defect.  All incisions were then anesthetized with Marcaine and closed with 4-0 Monocryl subcuticular sutures.  Steri- Strips and Band-Aids were applied.  The patient appeared to tolerate the procedure fairly well.  He was then extubated in the operating room and taken in stable condition to the recovery room.  All sponge, needle, and instrument counts were correct at the end of the procedure.     Abigail Miyamoto,  M.D.     DB/MEDQ  D:  09/02/2010  T:  09/03/2010  Job:  [id number]  Electronically Signed by Abigail Miyamoto M.D. on 09/06/2010 10:45:12 AM

## 2010-09-07 LAB — CULTURE, BLOOD (ROUTINE X 2)
Culture  Setup Time: 201207020859
Culture: NO GROWTH

## 2010-09-08 ENCOUNTER — Encounter (INDEPENDENT_AMBULATORY_CARE_PROVIDER_SITE_OTHER): Payer: Self-pay | Admitting: Surgery

## 2010-09-09 ENCOUNTER — Encounter (INDEPENDENT_AMBULATORY_CARE_PROVIDER_SITE_OTHER): Payer: Self-pay | Admitting: Surgery

## 2010-09-09 ENCOUNTER — Ambulatory Visit (INDEPENDENT_AMBULATORY_CARE_PROVIDER_SITE_OTHER): Payer: Medicare FFS | Admitting: Surgery

## 2010-09-09 DIAGNOSIS — Z9889 Other specified postprocedural states: Secondary | ICD-10-CM

## 2010-09-09 NOTE — Progress Notes (Signed)
Subjective:     Patient ID: Kevin Mcclain, male   DOB: September 24, 1945, 65 y.o.   MRN: [id number]    There were no vitals taken for this visit.    HPI He is here for his first postoperative visit status post laparoscopic cholecystectomy for gangrenous cholecystitis. He is one week out from surgery. The drain is draining clear fluid. He has moderate discomfort. He is eating well and moving his bowels well. He denies fever.  Review of Systems     Objective:   Physical Exam    On exam, he is well appearance. His abdomen is soft and nontender. The drain is draining serosanguineous fluid. There is no bile. At this point, I removed the drain.  The final pathology showed cholecystitis with cholelithiasis. Assessment:       Patient status post left upper cholecystectomy for gangrenous cholecystitis Plan:       At this point he may return to normal activity in one week. He will call should he develop any fevers or chills. If this does develop we will need to get a CAT scan to rule out a bile leak.

## 2010-09-11 ENCOUNTER — Telehealth (INDEPENDENT_AMBULATORY_CARE_PROVIDER_SITE_OTHER): Payer: Self-pay

## 2010-09-11 ENCOUNTER — Encounter: Payer: Self-pay | Admitting: Physician Assistant

## 2010-09-11 NOTE — Telephone Encounter (Signed)
Mr. Ortez was requesting a change from Percocet to Norco.  The Percocet was making him "loopy".  I called in the protocol #30 and pt is aware.

## 2010-09-12 ENCOUNTER — Other Ambulatory Visit (INDEPENDENT_AMBULATORY_CARE_PROVIDER_SITE_OTHER): Payer: Self-pay

## 2010-09-16 NOTE — Telephone Encounter (Signed)
Rx for Norco called to pt's pharmacy

## 2010-09-17 ENCOUNTER — Encounter: Payer: Medicare FFS | Admitting: Physician Assistant

## 2010-09-18 ENCOUNTER — Ambulatory Visit (INDEPENDENT_AMBULATORY_CARE_PROVIDER_SITE_OTHER): Payer: Medicare FFS | Admitting: General Surgery

## 2010-09-19 ENCOUNTER — Encounter: Payer: Self-pay | Admitting: Internal Medicine

## 2010-09-19 ENCOUNTER — Ambulatory Visit (INDEPENDENT_AMBULATORY_CARE_PROVIDER_SITE_OTHER): Payer: Medicare FFS | Admitting: Internal Medicine

## 2010-09-19 DIAGNOSIS — R7401 Elevation of levels of liver transaminase levels: Secondary | ICD-10-CM

## 2010-09-19 DIAGNOSIS — K819 Cholecystitis, unspecified: Secondary | ICD-10-CM

## 2010-09-19 DIAGNOSIS — E876 Hypokalemia: Secondary | ICD-10-CM

## 2010-09-19 DIAGNOSIS — R748 Abnormal levels of other serum enzymes: Secondary | ICD-10-CM

## 2010-09-19 DIAGNOSIS — R945 Abnormal results of liver function studies: Secondary | ICD-10-CM

## 2010-09-19 DIAGNOSIS — E119 Type 2 diabetes mellitus without complications: Secondary | ICD-10-CM

## 2010-09-19 DIAGNOSIS — R7989 Other specified abnormal findings of blood chemistry: Secondary | ICD-10-CM

## 2010-09-19 LAB — BASIC METABOLIC PANEL
BUN: 14 mg/dL (ref 6–23)
CO2: 23 mEq/L (ref 19–32)
Chloride: 101 mEq/L (ref 96–112)
Creat: 0.81 mg/dL (ref 0.50–1.35)
Glucose, Bld: 112 mg/dL — ABNORMAL HIGH (ref 70–99)

## 2010-09-19 LAB — HEPATIC FUNCTION PANEL
ALT: 77 U/L — ABNORMAL HIGH (ref 0–53)
Indirect Bilirubin: 0.8 mg/dL (ref 0.0–0.9)
Total Protein: 6.9 g/dL (ref 6.0–8.3)

## 2010-09-19 MED ORDER — HYDROCODONE-ACETAMINOPHEN 5-500 MG PO TABS: 1.0000 | ORAL_TABLET | Freq: Four times a day (QID) | ORAL | Status: DC | PRN

## 2010-09-19 NOTE — Patient Instructions (Signed)
Please schedule cbc, chem7, a1c 250.0  Lipid/lft 272.4 prior to next visit

## 2010-09-20 DIAGNOSIS — K819 Cholecystitis, unspecified: Secondary | ICD-10-CM | POA: Insufficient documentation

## 2010-09-20 DIAGNOSIS — R7989 Other specified abnormal findings of blood chemistry: Secondary | ICD-10-CM | POA: Insufficient documentation

## 2010-09-20 DIAGNOSIS — R945 Abnormal results of liver function studies: Secondary | ICD-10-CM | POA: Insufficient documentation

## 2010-09-20 DIAGNOSIS — E876 Hypokalemia: Secondary | ICD-10-CM | POA: Insufficient documentation

## 2010-09-20 NOTE — Assessment & Plan Note (Signed)
Status post cholecystectomy. Refill Vicodin for short-term as needed use. Recommend stool softener daily and MiraLax as needed

## 2010-09-20 NOTE — Assessment & Plan Note (Signed)
Average control with Amaryl. Obtain Chem-7. Discussed continuation of Amaryl versus resumption of metformin. Given lack of side effects and current control Will continue Amaryl. Discussed potential for hypoglycemia

## 2010-09-20 NOTE — Assessment & Plan Note (Signed)
Mild. Asymptomatic. Repeat Chem-7

## 2010-09-20 NOTE — Assessment & Plan Note (Signed)
Repeat liver function tests. 

## 2010-09-20 NOTE — Progress Notes (Signed)
  Subjective:    Patient ID: Kevin Mcclain, male    DOB: 07/30/1945, 65 y.o.   MRN: [id number]  HPI patient is a very pleasant gentleman who presents clinic for hospital followup of cholecystitis. Suffered from abdominal pain and nausea postprandially and presented to the emergency department with findings of acute cholecystitis. Underwent cholecystectomy without complication and was found to have gangrenous gallbladder. Liver function test elevated during hospitalization and patient states metformin was stopped. Currently taking Amaryl without adverse effect. Reports blood sugars predominantly less than 130 with one single episode of hypoglycemia with a value of 60. Also is noted to be mildly hypokalemic during hospitalization. Has followed up with surgery since discharge. Has mild constipation for which he is taking MiraLax as needed. Is taking hydrocodone as needed for postoperative pain and requests refill. No other complaints  Reviewed past medical history, medications and allergies  Review of Systems see history of present illness     Objective:   Physical Exam    Physical Exam  Vitals reviewed. Constitutional:  appears well-developed and well-nourished. No distress.  HENT:  Head: Normocephalic and atraumatic.  Nose: Nose normal.  Mouth/Throat: Oropharynx is clear and moist. No oropharyngeal exudate.  Eyes: Conjunctivae and EOM are normal. Pupils are equal, round, and reactive to light. Right eye exhibits no discharge. Left eye exhibits no discharge. No scleral icterus.  Neck: Neck supple. No thyromegaly present.  Cardiovascular: Normal rate, regular rhythm and normal heart sounds.  Exam reveals no gallop and no friction rub.   No murmur heard. Pulmonary/Chest: Effort normal and breath sounds normal. No respiratory distress.  has no wheezes.  has no rales.  Abdomen: Soft, nondistended, minimal tenderness in the right upper quadrant are related to surgical incisions.  Neurological:   is alert.  Skin: Skin is warm and dry.  not diaphoretic.  Psychiatric: normal mood and affect.      Assessment & Plan:

## 2010-09-25 ENCOUNTER — Other Ambulatory Visit: Payer: Self-pay | Admitting: *Deleted

## 2010-09-25 MED ORDER — GLIPIZIDE 5 MG PO TABS: 5.0000 mg | ORAL_TABLET | Freq: Two times a day (BID) | ORAL | Status: DC

## 2010-09-25 NOTE — Telephone Encounter (Signed)
Ok with 6 rf

## 2010-09-25 NOTE — Telephone Encounter (Signed)
Patient called and left voice message stating when he was in the hospital his medication was changed to  Glipizide 5 mg and he is requesting a refill to [facility].

## 2010-09-25 NOTE — Telephone Encounter (Signed)
[facility], [facility]  [place], [place]

## 2010-09-27 NOTE — H&P (Signed)
NAMEJAYEN, Kevin Mcclain           ACCOUNT NO.:  [id number]  MEDICAL RECORD NO.:  [id number]  LOCATION:  [facility]                         FACILITY:  [facility]  PHYSICIAN:  Houston Siren, MD           DATE OF BIRTH:  02-Sep-1945  DATE OF ADMISSION:  08/29/2010 DATE OF DISCHARGE:                             HISTORY & PHYSICAL   PRIMARY CARE PHYSICIAN:  Barbette Hair. Artist Pais, DO.  CARDIOLOGY:  Bevelyn Buckles. Bensimhon, MD.  ADVANCE DIRECTIVE:  Full code.  REASON FOR ADMISSION:  Cholelithiasis with possible cholecystitis.  HISTORY OF PRESENT ILLNESS:  A 65 year old male with history of coronary artery disease, status post coronary artery bypass graft x5 in 2007, hypertension, diabetes, status post prior MI with recurrent right upper quadrant pain after eating.  He has no fever or chills, black stool or bloody stool.  He had some nausea, but no vomiting.  Workup in emergency room reveals leukocytosis with a white count of 156,000, hemoglobin of 14.4 g/dL.  He has a normal creatinine of 0.8 and a potassium of 4.1. abdominal ultrasound showed cholelithiasis with no ductal dilatation and a presence of sludge.  He was seen in consultation with General Surgery and hospitalist was asked to admit the patient.  PAST MEDICAL HISTORY:  Coronary artery disease status post bypass, hypertension, diabetes, gout, status post MI, hyperlipidemia, arthritis, nephrolithiasis.  PAST SURGICAL HISTORY:  Bypass graft and rotator cuff repair.  SOCIAL HISTORY:  He is married with children.  He is a retired Consulting civil engineer.  He denied tobacco, alcohol, or drug use.  ALLERGY:  CODEINE.  CURRENT MEDICATIONS:  Lipitor, metformin, Percocet, Plavix, lovastatin, colchicine, allopurinol, ranitidine, ramipril, and Lopressor.  PHYSICAL EXAM:  GENERAL:  He is alert and oriented and he is in no apparent distress.  His pain is improved with medication. VITAL SIGNS:  Temperature 98.0, blood pressure 145/75, pulse of 80, respiratory rate  16. EYES:  Sclerae are nonicteric. NECK:  Supple.  No carotid bruit. CARDIOVASCULAR:  Revealed S1-S2 regular. LUNGS:  Clear. ABDOMINAL:  Only slightly tender over the right upper quadrant area. Bowel sounds present.  No palpable mass.  No ascites.  EXTREMITIES: With no edema.  Good distal pulses bilaterally.  No calf tenderness. SKIN:  Warm and dry. NEUROLOGICAL AND PSYCHIATRIC:  Unremarkable.  OBJECTIVE FINDINGS:  As above.  ASSESSMENT AND PLAN:  This is a 65 year old with severe coronary artery disease, status post coronary artery bypass graft, hypertension, diabetes, presented with cholelithiasis and possible cholecystitis. Given his leukocytosis, we will admit and will rest his bowel with n.p.o., and will give him intravenous Unasyn.  Anticipating surgery, we will also stop his Plavix and reduce his aspirin to 81 mg per day. Since he is n.p.o. we will put him on insulin sliding scale and stop his metformin.  Surgery had requested cardiac clearance and due to his severe coronary artery disease, please consult Cardiology for his clearance.  In the interim, I will get an EKG and an echo.  He is a full code will be admitted to [facility] Team II.     Houston Siren, MD     PL/MEDQ  D:  08/30/2010  T:  08/30/2010  Job:  [id number]  cc:   Barbette Hair. Artist Pais, DO  Electronically Signed by Houston Siren  on 09/27/2010 01:48:36 AM

## 2010-10-06 ENCOUNTER — Telehealth: Payer: Self-pay | Admitting: Internal Medicine

## 2010-10-06 MED ORDER — HYDROCODONE-ACETAMINOPHEN 5-500 MG PO TABS: 1.0000 | ORAL_TABLET | Freq: Four times a day (QID) | ORAL | Status: DC | PRN

## 2010-10-06 MED ORDER — ALLOPURINOL 300 MG PO TABS: 300.0000 mg | ORAL_TABLET | Freq: Every day | ORAL | Status: DC

## 2010-10-06 MED ORDER — NITROGLYCERIN 0.4 MG SL SUBL: 0.4000 mg | SUBLINGUAL_TABLET | SUBLINGUAL | Status: DC | PRN

## 2010-10-06 NOTE — Telephone Encounter (Signed)
Refill- allopurinol 300mg  tablets. Take one tablet by mouth daily. Qty 90. Last fill 8.2.12

## 2010-10-06 NOTE — Telephone Encounter (Signed)
Rx refills sent to pharmacy. 

## 2010-10-09 ENCOUNTER — Telehealth: Payer: Self-pay | Admitting: Internal Medicine

## 2010-10-09 MED ORDER — CLOPIDOGREL BISULFATE 75 MG PO TABS: 75.0000 mg | ORAL_TABLET | Freq: Every day | ORAL | Status: DC

## 2010-10-09 NOTE — Telephone Encounter (Signed)
Rx refill sent to pharmacy. 

## 2010-10-09 NOTE — Telephone Encounter (Signed)
Refill- clopidogrel 75mg  tablet. Take one tablet by mouth every day. Qty 90. Last fill 7.7.12

## 2010-10-17 ENCOUNTER — Telehealth: Payer: Self-pay | Admitting: *Deleted

## 2010-10-17 DIAGNOSIS — R7989 Other specified abnormal findings of blood chemistry: Secondary | ICD-10-CM

## 2010-10-17 NOTE — Telephone Encounter (Signed)
Labs transferred from lab schedule for order in chart 

## 2010-10-20 ENCOUNTER — Other Ambulatory Visit: Payer: Medicare FFS

## 2010-10-26 ENCOUNTER — Other Ambulatory Visit: Payer: Self-pay | Admitting: Internal Medicine

## 2010-10-27 ENCOUNTER — Other Ambulatory Visit (INDEPENDENT_AMBULATORY_CARE_PROVIDER_SITE_OTHER): Payer: Medicare FFS

## 2010-10-27 ENCOUNTER — Telehealth: Payer: Self-pay | Admitting: Internal Medicine

## 2010-10-27 ENCOUNTER — Other Ambulatory Visit: Payer: Self-pay | Admitting: *Deleted

## 2010-10-27 DIAGNOSIS — R7989 Other specified abnormal findings of blood chemistry: Secondary | ICD-10-CM

## 2010-10-27 LAB — BASIC METABOLIC PANEL
BUN: 11 mg/dL (ref 6–23)
GFR: 96.81 mL/min (ref >60.00)
Potassium: 3.6 mEq/L (ref 3.5–5.1)
Sodium: 139 mEq/L (ref 135–145)

## 2010-10-27 MED ORDER — METFORMIN HCL 500 MG PO TABS: 500.0000 mg | ORAL_TABLET | Freq: Two times a day (BID) | ORAL | Status: DC

## 2010-10-27 NOTE — Telephone Encounter (Signed)
Rx refill sent to pharmacy. 

## 2010-10-27 NOTE — Telephone Encounter (Signed)
Refill-metformin 500mg  tablets. Take one tablet twice a day. Qty 180. Last fill 6.4.12

## 2010-10-27 NOTE — Telephone Encounter (Signed)
Patient is requesting a refill on Hydrocodone. 

## 2010-10-27 NOTE — Telephone Encounter (Signed)
Ok one prescription

## 2010-10-28 MED ORDER — HYDROCODONE-ACETAMINOPHEN 5-500 MG PO TABS: 1.0000 | ORAL_TABLET | Freq: Four times a day (QID) | ORAL | Status: DC | PRN

## 2010-10-28 NOTE — Telephone Encounter (Signed)
Rx printed-signed and presented to patient at appointment with his wife today.

## 2010-10-28 NOTE — Telephone Encounter (Signed)
Rx printed for provider and forwarded to provider for signature

## 2010-11-06 ENCOUNTER — Telehealth: Payer: Self-pay | Admitting: *Deleted

## 2010-11-06 NOTE — Telephone Encounter (Signed)
Received call from [facility] requesting clarification on pt's Altace 5mg  directions. Rx printed in June states 1 tablet daily and 2 tablets daily. Which direction is correct? Spoke to pt's wife and verified that pt has always been taking 2 tablets daily. Centricity still showed 2 tablets daily. Spoke to pharmacist at [facility] and gave verification of 2 tablets once a day.

## 2010-11-18 ENCOUNTER — Ambulatory Visit (INDEPENDENT_AMBULATORY_CARE_PROVIDER_SITE_OTHER): Payer: Medicare FFS | Admitting: Internal Medicine

## 2010-11-18 ENCOUNTER — Encounter: Payer: Self-pay | Admitting: Internal Medicine

## 2010-11-18 DIAGNOSIS — M19049 Primary osteoarthritis, unspecified hand: Secondary | ICD-10-CM

## 2010-11-18 DIAGNOSIS — M1990 Unspecified osteoarthritis, unspecified site: Secondary | ICD-10-CM | POA: Insufficient documentation

## 2010-11-18 DIAGNOSIS — J329 Chronic sinusitis, unspecified: Secondary | ICD-10-CM

## 2010-11-18 MED ORDER — CEFUROXIME AXETIL 500 MG PO TABS: 500.0000 mg | ORAL_TABLET | Freq: Two times a day (BID) | ORAL | Status: AC

## 2010-11-18 MED ORDER — TRAMADOL HCL 50 MG PO TABS: 50.0000 mg | ORAL_TABLET | Freq: Two times a day (BID) | ORAL | Status: DC | PRN

## 2010-11-18 NOTE — Assessment & Plan Note (Signed)
Pt with recurrence.  Cefuroxime has worked well in the past.  Use 500 mg bid x 10 days.  Patient advised to call office if symptoms persist or worsen.

## 2010-11-18 NOTE — Patient Instructions (Signed)
Please call our office if your symptoms do not improve or gets worse. Take tylenol 325mg  or 500 mg with tramadol 50 mg as directed.

## 2010-11-18 NOTE — Assessment & Plan Note (Signed)
Pt with chronic osteoarthritis of hands and neck.  Use tramadol as directed.  NSAIDs cause GI upset and higher risk for renal insuff in diabetic on ACE

## 2010-11-18 NOTE — Progress Notes (Signed)
Subjective:    Patient ID: Kevin Mcclain, male    DOB: August 19, 1945, 65 y.o.   MRN: [id number]  Sinus Problem Associated symptoms include congestion, coughing and sinus pressure. Pertinent negatives include no chills or shortness of breath.  Sinusitis This is a new problem. The current episode started in the past 7 days. The problem has been gradually worsening since onset. There has been no fever. The pain is mild. Associated symptoms include congestion, coughing and sinus pressure. Pertinent negatives include no chills or shortness of breath. Past treatments include nothing.   He also complains of chronic osteoarthritis pain - mainly hands and neck.  NSAIDs cause GI upset.   Review of Systems  Constitutional: Negative for chills.  HENT: Positive for congestion and sinus pressure.   Respiratory: Positive for cough. Negative for shortness of breath.    Past Medical History  Diagnosis Date  . Depression   . Arthritis   . Diabetes mellitus type II   . Kidney stones   . CAD (coronary artery disease)     severe CAD-s/p MI 2000 with PTCA, s/p CABG 2005-by Dr Nathaniel Man at [facility] Last EF 50-55%  . Hypertension   . Hyperlipidemia   . Gout   . Vertigo     History   Social History  . Marital Status: Married    Spouse Name: N/A    Number of Children: N/A  . Years of Education: N/A   Occupational History  . Not on file.   Social History Main Topics  . Smoking status: Never Smoker   . Smokeless tobacco: Not on file  . Alcohol Use: No  . Drug Use: No  . Sexually Active: Not on file   Other Topics Concern  . 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 Coronary artery bypass graft 2005    5 grafts  . Cholecystectomy     Family History  Problem Relation Age of Onset  . Heart attack Mother     deceased age 70 secondary  .  Heart disease Mother   . Colon cancer Father   . Stroke Father     deceased age 45  . Heart disease Father   . Cancer Father     colon  . Stroke Sister 85  . Heart disease Sister   . Coronary artery disease Brother     s/p bypass surgery  . Heart disease Brother   . Heart disease Brother     Allergies  Allergen Reactions  . Benzonatate   . Codeine   . Sitagliptin Phosphate     Current Outpatient Prescriptions on File Prior to Visit  Medication Sig Dispense Refill  . allopurinol (ZYLOPRIM) 300 MG tablet Take 1 tablet (300 mg total) by mouth daily.  30 tablet  3  . amLODipine (NORVASC) 5 MG tablet Take 1 tablet (5 mg total) by mouth daily.  30 tablet  2  . aspirin 325 MG tablet Take 325 mg by mouth daily.        Marland Kitchen atorvastatin (LIPITOR) 20 MG tablet Take 1 tablet (20 mg total) by mouth at bedtime.  30 tablet  2  . chlorpheniramine-hydrocodone (TUSSIONEX) 8-10 MG/5ML suspension Take 5 mLs by mouth every 12 (twelve) hours as needed for cough.  60 mL  0  .  clopidogrel (PLAVIX) 75 MG tablet Take 1 tablet (75 mg total) by mouth daily.  30 tablet  6  . colchicine 0.6 MG tablet Take 0.6 mg by mouth daily as needed.       . desoximetasone (TOPICORT) 0.25 % cream Apply topically 2 (two) times daily.        . metFORMIN (GLUCOPHAGE) 500 MG tablet Take 1 tablet (500 mg total) by mouth 2 (two) times daily with a meal.  180 tablet  1  . metoprolol (TOPROL-XL) 50 MG 24 hr tablet Take 1 tablet (50 mg total) by mouth daily.  30 tablet  2  . nitroGLYCERIN (NITROSTAT) 0.4 MG SL tablet Place 1 tablet (0.4 mg total) under the tongue every 5 (five) minutes as needed for chest pain.  20 tablet  11  . POTASSIUM CHLORIDE CR PO Take by mouth daily.        . ranitidine (ZANTAC) 75 MG tablet Take 75 mg by mouth at bedtime.        . AMOXICILLIN PO Take by mouth 2 (two) times daily.        Marland Kitchen GLIMEPIRIDE PO Take 5 mg by mouth 2 (two) times daily.        Marland Kitchen glipiZIDE (GLUCOTROL) 5 MG tablet Take 1 tablet (5 mg  total) by mouth 2 (two) times daily before a meal.  60 tablet  6    BP 132/78  Temp(Src) 98.4 F (36.9 C) (Oral)  Wt 154 lb (69.854 kg)        Objective:   Physical Exam   Constitutional: Appears well-developed and well-nourished. No distress.  Head: Normocephalic and atraumatic.  Ear:  Right and Left TM retracted Mouth/Throat: Oropharynx is slightly red Eyes: Conjunctivae are normal. Pupils are equal, round, and reactive to light.  Neck: Normal range of motion. Neck supple. No thyromegaly present. No carotid bruit.  No adenopathy Cardiovascular: Normal rate, regular rhythm and normal heart sounds.  Exam reveals no gallop and no friction rub.   No murmur heard. Pulmonary/Chest: Effort normal and breath sounds normal.  No wheezes. No rales.       Assessment & Plan:

## 2010-12-03 ENCOUNTER — Ambulatory Visit: Payer: Medicare FFS | Admitting: Internal Medicine

## 2010-12-10 ENCOUNTER — Telehealth: Payer: Self-pay | Admitting: Internal Medicine

## 2010-12-10 MED ORDER — AMLODIPINE BESYLATE 5 MG PO TABS: 5.0000 mg | ORAL_TABLET | Freq: Every day | ORAL | Status: DC

## 2010-12-10 NOTE — Telephone Encounter (Signed)
rx sent in electronically 

## 2010-12-10 NOTE — Telephone Encounter (Signed)
Refill- amlodipine besylate 5mg  tablets. Take one tablet by mouth every day. Qty 90. Last fill 7.12.12

## 2010-12-16 ENCOUNTER — Ambulatory Visit: Payer: Medicare FFS | Admitting: Internal Medicine

## 2010-12-22 ENCOUNTER — Telehealth: Payer: Self-pay | Admitting: Internal Medicine

## 2010-12-22 MED ORDER — HYDROCODONE-ACETAMINOPHEN 7.5-500 MG PO TABS: 1.0000 | ORAL_TABLET | Freq: Four times a day (QID) | ORAL | Status: AC | PRN

## 2010-12-22 NOTE — Telephone Encounter (Signed)
Refill- hydrocodon-acetaminophen 5-500. Take one tablet by mouth every 6 hours as needed for pain. Qty 45. Last fill 8.28.12

## 2010-12-22 NOTE — Telephone Encounter (Signed)
rx called in, pt aware 

## 2010-12-22 NOTE — Telephone Encounter (Signed)
Ok to refill x 2  

## 2010-12-23 ENCOUNTER — Telehealth: Payer: Self-pay | Admitting: Internal Medicine

## 2010-12-23 MED ORDER — ALLOPURINOL 300 MG PO TABS: 300.0000 mg | ORAL_TABLET | Freq: Every day | ORAL | Status: DC

## 2010-12-23 NOTE — Telephone Encounter (Signed)
Refill- allopurinol 300mg tablets. Take one tablet by mouth daily. Qty 90. Last fill 8.2.12 °

## 2010-12-23 NOTE — Telephone Encounter (Signed)
rx sent in electronically 

## 2010-12-30 ENCOUNTER — Ambulatory Visit (INDEPENDENT_AMBULATORY_CARE_PROVIDER_SITE_OTHER): Payer: Medicare FFS

## 2010-12-30 DIAGNOSIS — Z23 Encounter for immunization: Secondary | ICD-10-CM

## 2011-03-17 ENCOUNTER — Other Ambulatory Visit: Payer: Self-pay | Admitting: *Deleted

## 2011-03-17 MED ORDER — AMLODIPINE BESYLATE 5 MG PO TABS: 5.0000 mg | ORAL_TABLET | Freq: Every day | ORAL | Status: DC

## 2011-03-31 ENCOUNTER — Other Ambulatory Visit: Payer: Self-pay | Admitting: Family

## 2011-03-31 NOTE — Telephone Encounter (Signed)
Verified with pt that he wishes to continue his care with Dr Artist Pais and refills have been forwarded to Provider. Please advise re: # of refills.

## 2011-03-31 NOTE — Telephone Encounter (Signed)
Pt needs refill on hydrocodone 5-500mg  call into [facility]

## 2011-04-01 ENCOUNTER — Telehealth: Payer: Self-pay | Admitting: Internal Medicine

## 2011-04-01 NOTE — Telephone Encounter (Signed)
Refill Hydrocodone. See previous message. Thanks.

## 2011-04-02 ENCOUNTER — Telehealth: Payer: Self-pay | Admitting: *Deleted

## 2011-04-02 NOTE — Telephone Encounter (Signed)
Refill hydrocodone 5/500 1 q6 hours prn for pain #45

## 2011-04-02 NOTE — Telephone Encounter (Signed)
Per previous note, pt is going to continue care with Dr Artist Pais

## 2011-04-03 MED ORDER — HYDROCODONE-ACETAMINOPHEN 5-500 MG PO TABS: 1.0000 | ORAL_TABLET | Freq: Four times a day (QID) | ORAL | Status: AC | PRN

## 2011-04-03 NOTE — Telephone Encounter (Signed)
Addended by: Alfred Levins D on: 04/03/2011 04:57 PM   Modules accepted: Orders

## 2011-04-03 NOTE — Telephone Encounter (Signed)
rx called in, pt aware 

## 2011-04-03 NOTE — Telephone Encounter (Signed)
Ok to refill x 1  

## 2011-04-03 NOTE — Telephone Encounter (Signed)
Pt wife is aware med will be call into [facility]

## 2011-04-06 ENCOUNTER — Telehealth: Payer: Self-pay | Admitting: Internal Medicine

## 2011-04-06 DIAGNOSIS — E785 Hyperlipidemia, unspecified: Secondary | ICD-10-CM

## 2011-04-06 DIAGNOSIS — E119 Type 2 diabetes mellitus without complications: Secondary | ICD-10-CM

## 2011-04-06 DIAGNOSIS — Z125 Encounter for screening for malignant neoplasm of prostate: Secondary | ICD-10-CM

## 2011-04-06 NOTE — Telephone Encounter (Signed)
Pt has sch an ov in March 2013 and said that Dr Artist Pais usually does labs prior to visit. Pls order necessary labs and then advise. Thanks.

## 2011-04-07 NOTE — Telephone Encounter (Signed)
Future orders placed, pt needs to schedule lab appt

## 2011-04-07 NOTE — Telephone Encounter (Signed)
  BMET, A1c, microalb/cr ratio - 250.00 LFTs and fasting lipid - 272.4 PSA - v76.44

## 2011-04-07 NOTE — Telephone Encounter (Signed)
Lft vm for pt to cb and sch labs as noted. Waiting on call back.

## 2011-04-30 ENCOUNTER — Other Ambulatory Visit (INDEPENDENT_AMBULATORY_CARE_PROVIDER_SITE_OTHER): Payer: Medicare FFS

## 2011-04-30 DIAGNOSIS — E785 Hyperlipidemia, unspecified: Secondary | ICD-10-CM

## 2011-04-30 DIAGNOSIS — Z125 Encounter for screening for malignant neoplasm of prostate: Secondary | ICD-10-CM

## 2011-04-30 DIAGNOSIS — E119 Type 2 diabetes mellitus without complications: Secondary | ICD-10-CM

## 2011-04-30 LAB — BASIC METABOLIC PANEL
CO2: 25 mEq/L (ref 19–32)
Calcium: 9.7 mg/dL (ref 8.4–10.5)
Sodium: 137 mEq/L (ref 135–145)

## 2011-04-30 LAB — HEPATIC FUNCTION PANEL
AST: 33 U/L (ref 0–37)
Albumin: 4.5 g/dL (ref 3.5–5.2)
Alkaline Phosphatase: 73 U/L (ref 39–117)
Bilirubin, Direct: 0.2 mg/dL (ref 0.0–0.3)
Total Bilirubin: 0.9 mg/dL (ref 0.3–1.2)

## 2011-04-30 LAB — LIPID PANEL: HDL: 36.8 mg/dL — ABNORMAL LOW (ref >39.00)

## 2011-04-30 LAB — PSA: PSA: 1.26 ng/mL (ref 0.10–4.00)

## 2011-04-30 LAB — MICROALBUMIN / CREATININE URINE RATIO
Creatinine,U: 217.5 mg/dL
Microalb Creat Ratio: 0.5 mg/g (ref 0.0–30.0)

## 2011-05-04 ENCOUNTER — Ambulatory Visit (INDEPENDENT_AMBULATORY_CARE_PROVIDER_SITE_OTHER): Payer: Medicare FFS | Admitting: Internal Medicine

## 2011-05-04 ENCOUNTER — Encounter: Payer: Self-pay | Admitting: Internal Medicine

## 2011-05-04 DIAGNOSIS — R945 Abnormal results of liver function studies: Secondary | ICD-10-CM

## 2011-05-04 DIAGNOSIS — M25569 Pain in unspecified knee: Secondary | ICD-10-CM

## 2011-05-04 DIAGNOSIS — M25562 Pain in left knee: Secondary | ICD-10-CM

## 2011-05-04 DIAGNOSIS — M25539 Pain in unspecified wrist: Secondary | ICD-10-CM

## 2011-05-04 DIAGNOSIS — R7989 Other specified abnormal findings of blood chemistry: Secondary | ICD-10-CM

## 2011-05-04 DIAGNOSIS — I10 Essential (primary) hypertension: Secondary | ICD-10-CM

## 2011-05-04 DIAGNOSIS — M1990 Unspecified osteoarthritis, unspecified site: Secondary | ICD-10-CM

## 2011-05-04 DIAGNOSIS — E119 Type 2 diabetes mellitus without complications: Secondary | ICD-10-CM

## 2011-05-04 DIAGNOSIS — M25532 Pain in left wrist: Secondary | ICD-10-CM

## 2011-05-04 MED ORDER — METFORMIN HCL 500 MG PO TABS: 500.0000 mg | ORAL_TABLET | Freq: Two times a day (BID) | ORAL | Status: DC

## 2011-05-04 MED ORDER — ATORVASTATIN CALCIUM 20 MG PO TABS: 20.0000 mg | ORAL_TABLET | Freq: Every day | ORAL | Status: DC

## 2011-05-04 MED ORDER — CLOPIDOGREL BISULFATE 75 MG PO TABS: 75.0000 mg | ORAL_TABLET | Freq: Every day | ORAL | Status: DC

## 2011-05-04 MED ORDER — AMLODIPINE BESYLATE 5 MG PO TABS: 5.0000 mg | ORAL_TABLET | Freq: Every day | ORAL | Status: DC

## 2011-05-04 MED ORDER — METOPROLOL SUCCINATE ER 50 MG PO TB24: 50.0000 mg | ORAL_TABLET | Freq: Every day | ORAL | Status: DC

## 2011-05-04 MED ORDER — ALLOPURINOL 300 MG PO TABS: 300.0000 mg | ORAL_TABLET | Freq: Every day | ORAL | Status: DC

## 2011-05-04 MED ORDER — HYDROCODONE-ACETAMINOPHEN 5-500 MG PO TABS: 1.0000 | ORAL_TABLET | Freq: Two times a day (BID) | ORAL | Status: DC | PRN

## 2011-05-04 MED ORDER — RAMIPRIL 5 MG PO TABS: 10.0000 mg | ORAL_TABLET | Freq: Every day | ORAL | Status: DC

## 2011-05-04 NOTE — Assessment & Plan Note (Signed)
Stable.  Continue current medication regimen. Lab Results  Component Value Date   HGBA1C 6.7* 04/30/2011   Lab Results  Component Value Date   CREATININE 0.8 04/30/2011

## 2011-05-04 NOTE — Assessment & Plan Note (Signed)
Well controlled.   BP: 128/78 mmHg

## 2011-05-04 NOTE — Patient Instructions (Signed)
Please complete the following lab tests before your next follow up appointment: BMET, A1c - 250.00 

## 2011-05-04 NOTE — Progress Notes (Signed)
Subjective:    Patient ID: Kevin Mcclain, male    DOB: Jun 25, 1945, 66 y.o.   MRN: [id number]  HPI  A 66 year old white male with past history of coronary artery disease, type 2 diabetes hypertension and hyperlipidemia for routine followup.  Type 2 diabetes-good medication compliance. He follows a fairly healthy diet. His A1c is stable.  Patient complains of chronic osteoarthritis pain. His symptoms are most noticeable in his left knee. He also complains of intermittent severe left wrist pain with numbness and tingling in his fingertips. He also has generalized stiffness that improves with activity.  Previous ANA and rheumatoid factor testing was negative in 2010.  Review of Systems Negative for chest pains, negative for shortness of breath  Past Medical History  Diagnosis Date  . Depression   . Arthritis   . Diabetes mellitus type II   . Kidney stones   . CAD (coronary artery disease)     severe CAD-s/p MI 2000 with PTCA, s/p CABG 2005-by Dr Nathaniel Man at [facility] Last EF 50-55%  . Hypertension   . Hyperlipidemia   . Gout   . Vertigo     History   Social History  . Marital Status: Married    Spouse Name: N/A    Number of Children: N/A  . Years of Education: N/A   Occupational History  . Not on file.   Social History Main Topics  . Smoking status: Never Smoker   . Smokeless tobacco: Not on file  . Alcohol Use: No  . Drug Use: No  . Sexually Active: Not on file   Other Topics Concern  . 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 Coronary artery bypass graft 2005    5 grafts  . Cholecystectomy     Family History  Problem Relation Age of Onset  . Heart attack Mother     deceased age 31 secondary  . Heart disease Mother   . Colon cancer Father   . Stroke Father     deceased age 10  . Heart disease Father     . Cancer Father     colon  . Stroke Sister 38  . Heart disease Sister   . Coronary artery disease Brother     s/p bypass surgery  . Heart disease Brother   . Heart disease Brother     Allergies  Allergen Reactions  . Benzonatate   . Codeine   . Sitagliptin Phosphate     Current Outpatient Prescriptions on File Prior to Visit  Medication Sig Dispense Refill  . aspirin 325 MG tablet Take 325 mg by mouth daily.        . colchicine 0.6 MG tablet Take 0.6 mg by mouth daily as needed.       . desoximetasone (TOPICORT) 0.25 % cream Apply topically 2 (two) times daily.        . nitroGLYCERIN (NITROSTAT) 0.4 MG SL tablet Place 1 tablet (0.4 mg total) under the tongue every 5 (five) minutes as needed for chest pain.  20 tablet  11  . POTASSIUM CHLORIDE CR PO Take by mouth daily.        . ranitidine (ZANTAC) 75 MG tablet Take 75 mg by mouth at bedtime.        . traMADol (ULTRAM) 50  MG tablet Take 1 tablet (50 mg total) by mouth every 12 (twelve) hours as needed for pain.  30 tablet  1    BP 128/78  Pulse 76  Temp(Src) 98.2 F (36.8 C) (Oral)  Ht 5\' 5"  (1.651 m)  Wt 157 lb (71.215 kg)  BMI 26.13 kg/m2       Objective:   Physical Exam   Constitutional: Appears well-developed and well-nourished. No distress.  Head: Normocephalic and atraumatic.  Neck: Normal range of motion. Neck supple. No thyromegaly present. No carotid bruit Cardiovascular: Normal rate, regular rhythm and normal heart sounds.  Pulmonary/Chest: Effort normal and breath sounds normal.  No wheezes. No rales.  Abdominal: Soft. Bowel sounds are normal. No mass. There is no tenderness.  Neurological: Alert. No cranial nerve deficit.  Musculoskeletal: Positive Tinel's left wrist,  No joint inflammation, redness or tenderness Skin: Skin is warm and dry.  Psychiatric: Normal mood and affect. Behavior is normal.       Assessment & Plan:

## 2011-05-04 NOTE — Assessment & Plan Note (Signed)
Previous trial of tramadol ineffective.

## 2011-05-04 NOTE — Assessment & Plan Note (Signed)
Patient with chronic left knee pain likely secondary to DDD.  Obtain xray.  NSAID use limited by hx of CAD and plavix.  Continue vicodin for pain control for now.

## 2011-05-04 NOTE — Assessment & Plan Note (Addendum)
Patient has residual mild elevation in ALT.  Reassess in 6 months. Lab Results  Component Value Date   ALT 57* 04/30/2011   AST 33 04/30/2011   ALKPHOS 73 04/30/2011   BILITOT 0.9 04/30/2011

## 2011-05-04 NOTE — Assessment & Plan Note (Signed)
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  I also suspect degenerative joint dz.  Refer to Dr Teressa Senter for further evaluation and treatment.

## 2011-07-28 ENCOUNTER — Other Ambulatory Visit: Payer: Self-pay | Admitting: *Deleted

## 2011-07-28 MED ORDER — HYDROCODONE-ACETAMINOPHEN 5-500 MG PO TABS: 1.0000 | ORAL_TABLET | Freq: Two times a day (BID) | ORAL | Status: DC | PRN

## 2011-10-28 ENCOUNTER — Other Ambulatory Visit (INDEPENDENT_AMBULATORY_CARE_PROVIDER_SITE_OTHER): Payer: Medicare FFS

## 2011-10-28 DIAGNOSIS — E119 Type 2 diabetes mellitus without complications: Secondary | ICD-10-CM

## 2011-10-28 LAB — BASIC METABOLIC PANEL
Chloride: 106 mEq/L (ref 96–112)
GFR: 90.29 mL/min (ref >60.00)
Glucose, Bld: 150 mg/dL — ABNORMAL HIGH (ref 70–99)
Potassium: 4.5 mEq/L (ref 3.5–5.1)
Sodium: 138 mEq/L (ref 135–145)

## 2011-11-04 ENCOUNTER — Ambulatory Visit (INDEPENDENT_AMBULATORY_CARE_PROVIDER_SITE_OTHER): Payer: Medicare FFS | Admitting: Internal Medicine

## 2011-11-04 ENCOUNTER — Encounter: Payer: Self-pay | Admitting: Internal Medicine

## 2011-11-04 VITALS — BP 122/70 | HR 64 | Temp 98.0°F | Wt 156.0 lb

## 2011-11-04 DIAGNOSIS — J329 Chronic sinusitis, unspecified: Secondary | ICD-10-CM

## 2011-11-04 DIAGNOSIS — I10 Essential (primary) hypertension: Secondary | ICD-10-CM

## 2011-11-04 DIAGNOSIS — N200 Calculus of kidney: Secondary | ICD-10-CM

## 2011-11-04 DIAGNOSIS — E119 Type 2 diabetes mellitus without complications: Secondary | ICD-10-CM

## 2011-11-04 DIAGNOSIS — M1990 Unspecified osteoarthritis, unspecified site: Secondary | ICD-10-CM

## 2011-11-04 MED ORDER — RAMIPRIL 5 MG PO TABS: 10.0000 mg | ORAL_TABLET | Freq: Every day | ORAL | Status: DC

## 2011-11-04 MED ORDER — CLOPIDOGREL BISULFATE 75 MG PO TABS: 75.0000 mg | ORAL_TABLET | Freq: Every day | ORAL | Status: DC

## 2011-11-04 MED ORDER — AMLODIPINE BESYLATE 5 MG PO TABS: 5.0000 mg | ORAL_TABLET | Freq: Every day | ORAL | Status: DC

## 2011-11-04 MED ORDER — ACYCLOVIR 5 % EX OINT: TOPICAL_OINTMENT | CUTANEOUS | Status: DC

## 2011-11-04 MED ORDER — ALLOPURINOL 300 MG PO TABS: 300.0000 mg | ORAL_TABLET | Freq: Every day | ORAL | Status: DC

## 2011-11-04 MED ORDER — HYDROCODONE-ACETAMINOPHEN 5-500 MG PO TABS: 1.0000 | ORAL_TABLET | Freq: Two times a day (BID) | ORAL | Status: DC | PRN

## 2011-11-04 MED ORDER — ATORVASTATIN CALCIUM 20 MG PO TABS: 20.0000 mg | ORAL_TABLET | Freq: Every day | ORAL | Status: DC

## 2011-11-04 MED ORDER — COLCHICINE 0.6 MG PO TABS: 0.6000 mg | ORAL_TABLET | Freq: Every day | ORAL | Status: DC | PRN

## 2011-11-04 MED ORDER — METOPROLOL SUCCINATE ER 50 MG PO TB24: 50.0000 mg | ORAL_TABLET | Freq: Every day | ORAL | Status: DC

## 2011-11-04 MED ORDER — DESOXIMETASONE 0.25 % EX CREA: TOPICAL_CREAM | Freq: Two times a day (BID) | CUTANEOUS | Status: DC

## 2011-11-04 MED ORDER — AMOXICILLIN 875 MG PO TABS: 875.0000 mg | ORAL_TABLET | Freq: Two times a day (BID) | ORAL | Status: AC

## 2011-11-04 MED ORDER — METFORMIN HCL 500 MG PO TABS: 500.0000 mg | ORAL_TABLET | Freq: Two times a day (BID) | ORAL | Status: DC

## 2011-11-04 MED ORDER — TAMSULOSIN HCL 0.4 MG PO CAPS: 0.4000 mg | ORAL_CAPSULE | Freq: Every day | ORAL | Status: DC

## 2011-11-04 NOTE — Assessment & Plan Note (Signed)
Use vicodin 5/500 as needed.

## 2011-11-04 NOTE — Assessment & Plan Note (Signed)
Blood sugars are stable. A1c improved to 6.5. Continue metformin 500 mg twice daily.

## 2011-11-04 NOTE — Progress Notes (Signed)
Subjective:    Patient ID: Kevin Mcclain, male    DOB: [birth year], 66 y.o.   MRN: [id number]  HPI  66 year old white male with history of type 2 diabetes, hypertension and coronary artery disease for routine followup. Patient reports episode of possible bad sinus infection over the last 2-3 months. He describes purulent postnasal drip and metallic taste. For previous sinus infections, he was prescribed cefuroxime. Patient reports GI upset after taking cefuroxime.  Patient was on vacation with [place] at the beach last [place]. Patient reports passing fairly large kidney stone. Fortunately he had pain medications with him at the beach. He reports urine flow is somewhat weak.  Type 2 diabetes-blood sugars are well controlled. His A1c improved to 6.5.  Hypertension-stable.  Review of Systems Negative for chest pain or shortness of breath  Past Medical History  Diagnosis Date  . Depression   . Arthritis   . Diabetes mellitus type II   . Kidney stones   . 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 Hypertension   . Hyperlipidemia   . Gout   . Vertigo     History   Social History  . Marital Status: Married    Spouse Name: N/A    Number of Children: N/A  . Years of Education: N/A   Occupational History  . Not on file.   Social History Main Topics  . Smoking status: Never Smoker   . Smokeless tobacco: Not on file  . Alcohol Use: No  . Drug Use: No  . Sexually Active: Not on file   Other Topics Concern  . 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 Coronary artery bypass graft 2005    5 grafts  . Cholecystectomy     Family History  Problem Relation Age of Onset  . Heart attack Mother     deceased age 88 secondary  . Heart disease  Mother   . Colon cancer Father   . Stroke Father     deceased age 49  . Heart disease Father   . Cancer Father     colon  . Stroke Sister 49  . Heart disease Sister   . Coronary artery disease Brother     s/p bypass surgery  . Heart disease Brother   . Heart disease Brother     Allergies  Allergen Reactions  . Benzonatate   . Codeine   . Sitagliptin Phosphate     Current Outpatient Prescriptions on File Prior to Visit  Medication Sig Dispense Refill  . aspirin 325 MG tablet Take 325 mg by mouth daily.        Marland Kitchen POTASSIUM CHLORIDE CR PO Take by mouth daily.        . ranitidine (ZANTAC) 75 MG tablet Take 75 mg by mouth at bedtime.        Marland Kitchen DISCONTD: allopurinol (ZYLOPRIM) 300 MG tablet Take 1 tablet (300 mg total) by mouth daily.  90 tablet  1  . DISCONTD: amLODipine (NORVASC) 5 MG tablet Take 1 tablet (5 mg total) by mouth daily.  90 tablet  1  . DISCONTD: atorvastatin (LIPITOR) 20 MG tablet Take 1 tablet (20 mg total) by  mouth daily.  90 tablet  1  . DISCONTD: colchicine 0.6 MG tablet Take 0.6 mg by mouth daily as needed.       Marland Kitchen DISCONTD: metFORMIN (GLUCOPHAGE) 500 MG tablet Take 1 tablet (500 mg total) by mouth 2 (two) times daily with a meal.  180 tablet  1  . DISCONTD: metoprolol succinate (TOPROL-XL) 50 MG 24 hr tablet Take 1 tablet (50 mg total) by mouth daily. Take with or immediately following a meal.  90 tablet  1  . DISCONTD: nitroGLYCERIN (NITROSTAT) 0.4 MG SL tablet Place 1 tablet (0.4 mg total) under the tongue every 5 (five) minutes as needed for chest pain.  20 tablet  11  . DISCONTD: ramipril (ALTACE) 5 MG tablet Take 2 tablets (10 mg total) by mouth daily.  180 tablet  1    BP 122/70  Pulse 64  Temp 98 F (36.7 C) (Oral)  Wt 156 lb (70.761 kg)       Objective:   Physical Exam  Constitutional: He is oriented to person, place, and time. He appears well-developed and well-nourished.  HENT:  Head: Normocephalic and atraumatic.  Right Ear: External ear  normal.  Left Ear: External ear normal.       Mild oropharyngeal erythema  Neck: Neck supple.       No carotid bruit  Cardiovascular: Normal rate, regular rhythm and normal heart sounds.   No murmur heard. Pulmonary/Chest: Effort normal and breath sounds normal. He has no wheezes.  Abdominal: Soft. Bowel sounds are normal. There is no tenderness.  Musculoskeletal: He exhibits no edema.  Lymphadenopathy:    He has no cervical adenopathy.  Neurological: He is alert and oriented to person, place, and time. No cranial nerve deficit.  Skin: Skin is warm and dry.  Psychiatric: He has a normal mood and affect. His behavior is normal.          Assessment & Plan:

## 2011-11-04 NOTE — Assessment & Plan Note (Signed)
Patient experiencing flare of sinusitis. Patient reports GI upset with cefuroxime. Treat with amoxicillin 875 mg twice daily for 10 days.  Patient advised to call office if symptoms persist or worsen.

## 2011-11-04 NOTE — Assessment & Plan Note (Signed)
Sensitivity stone for analysis. Use tamsulosin 0.4 mg at bedtime in case he passes another stone in the future and his urine stream is weak.

## 2011-11-04 NOTE — Patient Instructions (Addendum)
Please call our office if your sinus symptoms do not improve.   You can use Tamsulosin 0.4 mg at bedtime as needed when you think you are passing kidney stone and urine stream is weak. Please complete the following lab tests before your next follow up appointment: BMET, A1c, microalbumin / Cr ratio - 250.00 LFTs, FLP - 272.4

## 2011-11-04 NOTE — Assessment & Plan Note (Signed)
Pressure is well controlled. No change in blood pressure medication regimen.   Lab Results  Component Value Date   CREATININE 0.9 10/28/2011

## 2011-11-08 LAB — STONE ANALYSIS: Stone Weight KSTONE: 0.041 g

## 2011-11-18 ENCOUNTER — Ambulatory Visit (INDEPENDENT_AMBULATORY_CARE_PROVIDER_SITE_OTHER): Payer: Medicare FFS | Admitting: Internal Medicine

## 2011-11-18 DIAGNOSIS — Z23 Encounter for immunization: Secondary | ICD-10-CM

## 2011-11-24 ENCOUNTER — Ambulatory Visit
Admission: RE | Admit: 2011-11-24 | Discharge: 2011-11-24 | Disposition: A | Discharge status: Home / Self Care | Payer: Medicare FFS | Source: Ambulatory Visit | Attending: Internal Medicine | Admitting: Internal Medicine

## 2011-11-24 DIAGNOSIS — N200 Calculus of kidney: Secondary | ICD-10-CM

## 2011-11-26 ENCOUNTER — Encounter: Payer: Self-pay | Admitting: Internal Medicine

## 2011-11-26 ENCOUNTER — Ambulatory Visit (INDEPENDENT_AMBULATORY_CARE_PROVIDER_SITE_OTHER): Payer: Medicare FFS | Admitting: Internal Medicine

## 2011-11-26 VITALS — BP 110/72 | HR 72 | Temp 98.3°F | Wt 158.0 lb

## 2011-11-26 DIAGNOSIS — N200 Calculus of kidney: Secondary | ICD-10-CM

## 2011-11-26 DIAGNOSIS — M545 Low back pain, unspecified: Secondary | ICD-10-CM | POA: Insufficient documentation

## 2011-11-26 DIAGNOSIS — J329 Chronic sinusitis, unspecified: Secondary | ICD-10-CM

## 2011-11-26 MED ORDER — METHOCARBAMOL 500 MG PO TABS: 500.0000 mg | ORAL_TABLET | Freq: Three times a day (TID) | ORAL | Status: DC | PRN

## 2011-11-26 MED ORDER — IPRATROPIUM BROMIDE 0.03 % NA SOLN: 2.0000 | Freq: Two times a day (BID) | NASAL | Status: DC

## 2011-11-26 MED ORDER — HYDROCODONE-HOMATROPINE 5-1.5 MG/5ML PO SYRP: 5.0000 mL | ORAL_SOLUTION | Freq: Three times a day (TID) | ORAL | Status: DC | PRN

## 2011-11-26 NOTE — Progress Notes (Signed)
Subjective:    Patient ID: Kevin Mcclain, male    DOB: [birth year], 66 y.o.   MRN: [id number]  HPI  66 year old white male with type 2 diabetes and hypertension previously seen for possible sinusitis for followup. Patient reports taking full dose of cefuroxime 500 mg twice daily for 10 days. His symptoms improved then over last few days patient has had recurrence with sinus congestion and headache. He denies fever or chills.  History of kidney stones-reviewed renal ultrasound. It showed bilateral small kidney stones and left renal cyst.  Patient complains of intermittent right-sided low back pain. Symptoms are worse with rotation of lumbar spine. No radiation of pain.   Review of Systems Negative for fevers or chills.  No lower extremity weakness  Past Medical History  Diagnosis Date  . Depression   . Arthritis   . Diabetes mellitus type II   . Kidney stones   . CAD (coronary artery disease)     severe CAD-s/p MI 2000 with PTCA, s/p CABG 2005-by Dr Nathaniel Man at [facility] Last EF 50-55%  . Hypertension   . Hyperlipidemia   . Gout   . Vertigo     History   Social History  . Marital Status: Married    Spouse Name: N/A    Number of Children: N/A  . Years of Education: N/A   Occupational History  . Not on file.   Social History Main Topics  . Smoking status: Never Smoker   . Smokeless tobacco: Not on file  . Alcohol Use: No  . Drug Use: No  . Sexually Active: Not on file   Other Topics Concern  . 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 Coronary artery bypass graft 2005    5 grafts  . Cholecystectomy     Family History  Problem Relation Age of Onset  . Heart attack Mother     deceased age 69 secondary  . Heart disease Mother   . Colon cancer Father   . Stroke Father     deceased age 18  . Heart disease  Father   . Cancer Father     colon  . Stroke Sister 40  . Heart disease Sister   . Coronary artery disease Brother     s/p bypass surgery  . Heart disease Brother   . Heart disease Brother     Allergies  Allergen Reactions  . Benzonatate   . Codeine   . Sitagliptin Phosphate     Current Outpatient Prescriptions on File Prior to Visit  Medication Sig Dispense Refill  . acyclovir ointment (ZOVIRAX) 5 % Apply topically every 3 (three) hours.  15 g  1  . allopurinol (ZYLOPRIM) 300 MG tablet Take 1 tablet (300 mg total) by mouth daily.  90 tablet  1  . amLODipine (NORVASC) 5 MG tablet Take 1 tablet (5 mg total) by mouth daily.  90 tablet  1  . aspirin 325 MG tablet Take 325 mg by mouth daily.        Marland Kitchen atorvastatin (LIPITOR) 20 MG tablet Take 1 tablet (20 mg total) by mouth daily.  90 tablet  1  . clopidogrel (PLAVIX) 75 MG tablet Take 1 tablet (75 mg total) by mouth daily.  90 tablet  1  . colchicine 0.6 MG  tablet Take 1 tablet (0.6 mg total) by mouth daily as needed.  90 tablet  1  . desoximetasone (TOPICORT) 0.25 % cream Apply topically 2 (two) times daily.  30 g  5  . HYDROcodone-acetaminophen (VICODIN) 5-500 MG per tablet Take 1 tablet by mouth every 12 (twelve) hours as needed.  60 tablet  3  . metFORMIN (GLUCOPHAGE) 500 MG tablet Take 1 tablet (500 mg total) by mouth 2 (two) times daily with a meal.  180 tablet  1  . metoprolol succinate (TOPROL-XL) 50 MG 24 hr tablet Take 1 tablet (50 mg total) by mouth daily. Take with or immediately following a meal.  90 tablet  1  . nitroGLYCERIN (NITROSTAT) 0.4 MG SL tablet Place 0.4 mg under the tongue every 5 (five) minutes as needed.      Marland Kitchen POTASSIUM CHLORIDE CR PO Take by mouth daily.        . ramipril (ALTACE) 5 MG tablet Take 2 tablets (10 mg total) by mouth daily.  180 tablet  1  . ranitidine (ZANTAC) 75 MG tablet Take 75 mg by mouth at bedtime.        . Tamsulosin HCl (FLOMAX) 0.4 MG CAPS Take 1 capsule (0.4 mg total) by mouth daily.   30 capsule  3  . ipratropium (ATROVENT) 0.03 % nasal spray Place 2 sprays into the nose every 12 (twelve) hours.  30 mL  3    BP 110/72  Pulse 72  Temp 98.3 F (36.8 C) (Oral)  Wt 158 lb (71.668 kg)       Objective:   Physical Exam  Constitutional: He is oriented to person, place, and time. He appears well-developed and well-nourished.  HENT:  Head: Normocephalic and atraumatic.  Right Ear: External ear normal.  Left Ear: External ear normal.  Mouth/Throat: No oropharyngeal exudate.       Oropharyngeal erythema  Cardiovascular: Normal rate, regular rhythm and normal heart sounds.   Pulmonary/Chest: Effort normal and breath sounds normal. He has no wheezes.  Musculoskeletal: He exhibits no edema.       Limited range of motion of lumbar spine- leftward rotation  Neurological: He is alert and oriented to person, place, and time.  Skin: Skin is warm and dry.  Psychiatric: He has a normal mood and affect. His behavior is normal.          Assessment & Plan:

## 2011-11-26 NOTE — Patient Instructions (Addendum)
Please call our office if your symptoms do not improve or gets worse.  

## 2011-11-26 NOTE — Assessment & Plan Note (Signed)
I suspect low back pain from muscle strain. Use Robaxin 500 mg 3 times daily as needed.  Use vicodin for refractory symptoms.

## 2011-11-26 NOTE — Assessment & Plan Note (Signed)
Renal ultrasound showed bilateral small kidney stones. Patient understands to use tamsulosin in the event of patient passing a stone in the future. Provided urine strainer. He has history of gout and is currently allopurinol.  If future stone analysis shows uric acid component consider urine alkalinization therapy.

## 2011-11-26 NOTE — Assessment & Plan Note (Signed)
Patient had improvement with course of cefuroxime. He has had recent exacerbation which I suspect is a viral upper respiratory infection.  I suggest symptomatic treatment.

## 2012-03-09 ENCOUNTER — Other Ambulatory Visit: Payer: Self-pay | Admitting: *Deleted

## 2012-03-09 MED ORDER — HYDROCODONE-ACETAMINOPHEN 5-500 MG PO TABS: 1.0000 | ORAL_TABLET | Freq: Two times a day (BID) | ORAL | Status: DC | PRN

## 2012-04-28 ENCOUNTER — Other Ambulatory Visit (INDEPENDENT_AMBULATORY_CARE_PROVIDER_SITE_OTHER): Payer: PRIVATE HEALTH INSURANCE

## 2012-04-28 DIAGNOSIS — E1059 Type 1 diabetes mellitus with other circulatory complications: Secondary | ICD-10-CM

## 2012-04-28 DIAGNOSIS — E785 Hyperlipidemia, unspecified: Secondary | ICD-10-CM

## 2012-04-28 LAB — BASIC METABOLIC PANEL
BUN: 10 mg/dL (ref 6–23)
CO2: 23 mEq/L (ref 19–32)
Calcium: 9.3 mg/dL (ref 8.4–10.5)
Creatinine, Ser: 0.9 mg/dL (ref 0.4–1.5)
Glucose, Bld: 171 mg/dL — ABNORMAL HIGH (ref 70–99)

## 2012-04-28 LAB — LIPID PANEL
HDL: 35.5 mg/dL — ABNORMAL LOW (ref >39.00)
LDL Cholesterol: 45 mg/dL (ref 0–99)
Total CHOL/HDL Ratio: 3
Triglycerides: 117 mg/dL (ref 0.0–149.0)
VLDL: 23.4 mg/dL (ref 0.0–40.0)

## 2012-04-28 LAB — HEPATIC FUNCTION PANEL
AST: 40 U/L — ABNORMAL HIGH (ref 0–37)
Albumin: 4.1 g/dL (ref 3.5–5.2)

## 2012-05-03 ENCOUNTER — Encounter: Payer: Self-pay | Admitting: Internal Medicine

## 2012-05-03 ENCOUNTER — Ambulatory Visit (INDEPENDENT_AMBULATORY_CARE_PROVIDER_SITE_OTHER): Payer: PRIVATE HEALTH INSURANCE | Admitting: Internal Medicine

## 2012-05-03 VITALS — BP 140/80 | HR 76 | Temp 98.3°F | Wt 162.0 lb

## 2012-05-03 DIAGNOSIS — R200 Anesthesia of skin: Secondary | ICD-10-CM

## 2012-05-03 DIAGNOSIS — R209 Unspecified disturbances of skin sensation: Secondary | ICD-10-CM

## 2012-05-03 DIAGNOSIS — E119 Type 2 diabetes mellitus without complications: Secondary | ICD-10-CM

## 2012-05-03 DIAGNOSIS — R945 Abnormal results of liver function studies: Secondary | ICD-10-CM

## 2012-05-03 DIAGNOSIS — I10 Essential (primary) hypertension: Secondary | ICD-10-CM

## 2012-05-03 DIAGNOSIS — R7989 Other specified abnormal findings of blood chemistry: Secondary | ICD-10-CM

## 2012-05-03 DIAGNOSIS — N200 Calculus of kidney: Secondary | ICD-10-CM

## 2012-05-03 MED ORDER — ALLOPURINOL 300 MG PO TABS: 300.0000 mg | ORAL_TABLET | Freq: Every day | ORAL | Status: DC

## 2012-05-03 MED ORDER — ATORVASTATIN CALCIUM 20 MG PO TABS: 20.0000 mg | ORAL_TABLET | Freq: Every day | ORAL | Status: DC

## 2012-05-03 MED ORDER — CLOPIDOGREL BISULFATE 75 MG PO TABS: 75.0000 mg | ORAL_TABLET | Freq: Every day | ORAL | Status: DC

## 2012-05-03 MED ORDER — METFORMIN HCL 500 MG PO TABS: 500.0000 mg | ORAL_TABLET | Freq: Two times a day (BID) | ORAL | Status: DC

## 2012-05-03 MED ORDER — RAMIPRIL 5 MG PO TABS: 10.0000 mg | ORAL_TABLET | Freq: Every day | ORAL | Status: DC

## 2012-05-03 MED ORDER — AMLODIPINE BESYLATE 5 MG PO TABS: 5.0000 mg | ORAL_TABLET | Freq: Every day | ORAL | Status: DC

## 2012-05-03 MED ORDER — METOPROLOL SUCCINATE ER 50 MG PO TB24: 50.0000 mg | ORAL_TABLET | Freq: Every day | ORAL | Status: DC

## 2012-05-03 MED ORDER — SAXAGLIPTIN HCL 5 MG PO TABS: 5.0000 mg | ORAL_TABLET | Freq: Every day | ORAL | Status: DC

## 2012-05-03 MED ORDER — HYDROCODONE-ACETAMINOPHEN 5-500 MG PO TABS: 1.0000 | ORAL_TABLET | Freq: Two times a day (BID) | ORAL | Status: DC | PRN

## 2012-05-03 MED ORDER — NITROGLYCERIN 0.4 MG SL SUBL: 0.4000 mg | SUBLINGUAL_TABLET | SUBLINGUAL | Status: DC | PRN

## 2012-05-03 NOTE — Assessment & Plan Note (Signed)
Patient continues to have mild elevation in transaminases. Abnormal LFTs may be secondary to statin therapy versus fatty liver. Continue to monitor every 6 months. Lab Results  Component Value Date   ALT 66* 04/28/2012   AST 40* 04/28/2012   ALKPHOS 71 04/28/2012   BILITOT 1.2 04/28/2012

## 2012-05-03 NOTE — Assessment & Plan Note (Addendum)
Stable.  No change in BP regimen.  I encouraged regular exercise. BP: 140/80 mmHg  Lab Results  Component Value Date   CREATININE 0.9 04/28/2012

## 2012-05-03 NOTE — Patient Instructions (Addendum)
Please complete the following lab tests before your next follow up appointment: TSH, Free T4, B12 level - 782.0

## 2012-05-03 NOTE — Assessment & Plan Note (Signed)
Diabetes control has worsened. Patient could not tolerate Januvia due to GI side effects in the past. Trial of Onglyza 5 mg once daily. I would like to avoid TZDs with history of coronary disease.

## 2012-05-03 NOTE — Assessment & Plan Note (Signed)
Patient complains of intermittent pins and needles sensation in both hands and feet. He may be developing signs of early diabetic neuropathy. Rule out hypothyroidism and check B12 level.

## 2012-05-03 NOTE — Progress Notes (Signed)
Subjective:    Patient ID: Kevin Mcclain, male    DOB: 1946/02/20, 67 y.o.   MRN: [id number]  HPI  67 year old white male with history of coronary artery disease, hypertension and type 2 diabetes for routine followup.  There's been mild weight gain since previous visit. He has been less active over the letter. His A1c is also worse at 7.4. He is taking metformin without difficulty.  He complains of intermittent burning/tingling sensation in both hands and feet. Symptoms are intermittent.  Patient also complains of fatigue/lack of motivation. Wife thinks he might have mild depression. He has family history of hypothyroidism.  Review of Systems Negative for chest pain or shortness of breath  Past Medical History  Diagnosis Date  . Depression   . Arthritis   . Diabetes mellitus type II   . Kidney stones   . CAD (coronary artery disease)     severe CAD-s/p MI 2000 with PTCA, s/p CABG 2005-by Dr Nathaniel Man at [facility] Last EF 50-55%  . Hypertension   . Hyperlipidemia   . Gout   . Vertigo     History   Social History  . Marital Status: Married    Spouse Name: N/A    Number of Children: N/A  . Years of Education: N/A   Occupational History  . Not on file.   Social History Main Topics  . Smoking status: Never Smoker   . Smokeless tobacco: Not on file  . Alcohol Use: No  . Drug Use: No  . Sexually Active: Not on file   Other Topics Concern  . 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 Coronary artery bypass graft  2005    5 grafts  . Cholecystectomy      Family History  Problem Relation Age of Onset  . Heart attack Mother     deceased age 52 secondary  . Heart disease Mother   . Colon cancer Father   . Stroke Father     deceased age 85  . Heart disease Father   . Cancer Father    colon  . Stroke Sister 19  . Heart disease Sister   . Coronary artery disease Brother     s/p bypass surgery  . Heart disease Brother   . Heart disease Brother     Allergies  Allergen Reactions  . Benzonatate   . Codeine   . Sitagliptin Phosphate     Current Outpatient Prescriptions on File Prior to Visit  Medication Sig Dispense Refill  . aspirin 325 MG tablet Take 325 mg by mouth daily.        . colchicine 0.6 MG tablet Take 1 tablet (0.6 mg total) by mouth daily as needed.  90 tablet  1  . desoximetasone (TOPICORT) 0.25 % cream Apply topically 2 (two) times daily.  30 g  5  . ipratropium (ATROVENT) 0.03 % nasal spray Place 2 sprays into the nose every 12 (twelve) hours.  30 mL  3  . POTASSIUM CHLORIDE CR PO Take by mouth daily.        . ranitidine (ZANTAC) 75 MG tablet Take 75 mg by mouth at bedtime.        . Tamsulosin HCl (FLOMAX)  0.4 MG CAPS Take 1 capsule (0.4 mg total) by mouth daily.  30 capsule  3   No current facility-administered medications on file prior to visit.    BP 140/80  Pulse 76  Temp(Src) 98.3 F (36.8 C) (Oral)  Wt 162 lb (73.483 kg)  BMI 26.96 kg/m2        Objective:   Physical Exam  Constitutional: He is oriented to person, place, and time. He appears well-developed and well-nourished.  HENT:  Head: Normocephalic and atraumatic.  Cardiovascular: Normal rate, regular rhythm and normal heart sounds.   Pulmonary/Chest: Effort normal and breath sounds normal. He has no wheezes.  Abdominal: Soft. Bowel sounds are normal. There is no tenderness.  Musculoskeletal: He exhibits no edema.  Neurological: He is alert and oriented to person, place, and time.  Skin: Skin is warm and dry.  Diabetic foot exam-no cracks or fissures, normal sensation to temperature and vibration, normal pulses  Psychiatric: He has a normal mood and affect. His behavior is normal.          Assessment & Plan:

## 2012-05-03 NOTE — Assessment & Plan Note (Signed)
Patient reports mild right-sided flank pain over last several days. He may be passing another small kidney stone. Patient advised to restart Flomax and use Vicodin as needed. Increase fluid intake. Previous stone analysis showed calcium oxalate. He did not have any uric acid component.

## 2012-05-04 ENCOUNTER — Other Ambulatory Visit: Payer: Self-pay | Admitting: Internal Medicine

## 2012-05-04 ENCOUNTER — Telehealth: Payer: Self-pay | Admitting: Internal Medicine

## 2012-05-04 MED ORDER — HYDROCODONE-ACETAMINOPHEN 5-325 MG PO TABS: 1.0000 | ORAL_TABLET | Freq: Two times a day (BID) | ORAL | Status: DC | PRN

## 2012-05-04 NOTE — Telephone Encounter (Signed)
[facility] received a rx for HYDROcodone-acetaminophen (VICODIN) 5-500 MG per tablet This strength is no longer available, d/t the 500mg  acetaminophen. Please call in new rx

## 2012-05-04 NOTE — Telephone Encounter (Signed)
Per Dr Artist Pais change to hydrocodone 5/325 same directions, rx called in

## 2012-05-31 ENCOUNTER — Other Ambulatory Visit (INDEPENDENT_AMBULATORY_CARE_PROVIDER_SITE_OTHER): Payer: PRIVATE HEALTH INSURANCE

## 2012-05-31 DIAGNOSIS — R209 Unspecified disturbances of skin sensation: Secondary | ICD-10-CM

## 2012-05-31 LAB — TSH: TSH: 3.05 u[IU]/mL (ref 0.35–5.50)

## 2012-05-31 LAB — VITAMIN B12: Vitamin B-12: 227 pg/mL (ref 211–911)

## 2012-05-31 LAB — T4, FREE: Free T4: 0.88 ng/dL (ref 0.60–1.60)

## 2012-06-07 ENCOUNTER — Ambulatory Visit (INDEPENDENT_AMBULATORY_CARE_PROVIDER_SITE_OTHER): Payer: PRIVATE HEALTH INSURANCE | Admitting: Internal Medicine

## 2012-06-07 VITALS — BP 128/80 | HR 64 | Temp 98.0°F | Wt 163.0 lb

## 2012-06-07 DIAGNOSIS — F3289 Other specified depressive episodes: Secondary | ICD-10-CM

## 2012-06-07 DIAGNOSIS — F329 Major depressive disorder, single episode, unspecified: Secondary | ICD-10-CM

## 2012-06-07 DIAGNOSIS — R200 Anesthesia of skin: Secondary | ICD-10-CM

## 2012-06-07 DIAGNOSIS — R209 Unspecified disturbances of skin sensation: Secondary | ICD-10-CM

## 2012-06-07 NOTE — Assessment & Plan Note (Signed)
Pins and needles sensation in both hands and feet likely secondary to early diabetic neuropathy. He has low normal B12 level. He is taking sublingual oral B12 replacement. Repeat B12 level in 2 months. If levels are still low, he may have pernicious anemia and will require B12 injections.

## 2012-06-07 NOTE — Assessment & Plan Note (Signed)
Improved. His thyroid studies normal.

## 2012-06-07 NOTE — Patient Instructions (Addendum)
Please complete the following lab tests before your next follow up appointment: BMET, A1c - 250.02 B12 level - 266.2

## 2012-06-07 NOTE — Progress Notes (Signed)
Subjective:    Patient ID: Kevin Mcclain, male    DOB: 1945-07-09, 67 y.o.   MRN: [id number]  HPI  67 year old white male with previously seen for depressive symptoms and intermittent tingling sensation in both hands and feet for followup. Patient's blood work  showed low normal B12 level. He's been taking over-the-counter sublingual B12 supplements. He reports fatigue symptoms have improved.  Thyroid studies were normal.  Review of Systems Negative for chest pain  Past Medical History  Diagnosis Date  . Depression   . Arthritis   . Diabetes mellitus type II   . Kidney stones   . 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 Hypertension   . Hyperlipidemia   . Gout   . Vertigo     History   Social History  . Marital Status: Married    Spouse Name: N/A    Number of Children: N/A  . Years of Education: N/A   Occupational History  . Not on file.   Social History Main Topics  . Smoking status: Never Smoker   . Smokeless tobacco: Not on file  . Alcohol Use: No  . Drug Use: No  . Sexually Active: Not on file   Other Topics Concern  . 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 Coronary artery bypass graft  2005    5 grafts  . Cholecystectomy      Family History  Problem Relation Age of Onset  . Heart attack Mother     deceased age 68 secondary  . Heart disease Mother   . Colon cancer Father   . Stroke Father     deceased age 76  . Heart disease Father   . Cancer Father     colon  . Stroke Sister 24  . Heart disease Sister   . Coronary artery disease Brother     s/p bypass surgery  . Heart disease Brother   . Heart disease Brother     Allergies  Allergen Reactions  . Benzonatate   . Codeine    . Sitagliptin Phosphate     Current Outpatient Prescriptions on File Prior to Visit  Medication Sig Dispense Refill  . allopurinol (ZYLOPRIM) 300 MG tablet Take 1 tablet (300 mg total) by mouth daily.  90 tablet  1  . amLODipine (NORVASC) 5 MG tablet Take 1 tablet (5 mg total) by mouth daily.  90 tablet  1  . aspirin 325 MG tablet Take 325 mg by mouth daily.        Marland Kitchen atorvastatin (LIPITOR) 20 MG tablet Take 1 tablet (20 mg total) by mouth daily.  90 tablet  1  . clopidogrel (PLAVIX) 75 MG tablet Take 1 tablet (75 mg total) by mouth daily.  90 tablet  1  . colchicine 0.6 MG tablet Take 1 tablet (0.6 mg total) by mouth daily as needed.  90 tablet  1  . desoximetasone (TOPICORT) 0.25 % cream Apply topically 2 (two) times daily.  30 g  5  . HYDROcodone-acetaminophen (NORCO/VICODIN) 5-325 MG per tablet Take 1 tablet by mouth every 12 (twelve) hours as  needed for pain.  60 tablet  2  . ipratropium (ATROVENT) 0.03 % nasal spray Place 2 sprays into the nose every 12 (twelve) hours.  30 mL  3  . metFORMIN (GLUCOPHAGE) 500 MG tablet TAKE 1 TABLET BY MOUTH TWICE DAILY WITH MEAL  180 tablet  0  . metoprolol succinate (TOPROL-XL) 50 MG 24 hr tablet Take 1 tablet (50 mg total) by mouth daily. Take with or immediately following a meal.  90 tablet  1  . nitroGLYCERIN (NITROSTAT) 0.4 MG SL tablet Place 1 tablet (0.4 mg total) under the tongue every 5 (five) minutes as needed.  50 tablet  11  . POTASSIUM CHLORIDE CR PO Take by mouth daily.        . ramipril (ALTACE) 5 MG tablet Take 2 tablets (10 mg total) by mouth daily.  180 tablet  1  . ranitidine (ZANTAC) 75 MG tablet Take 75 mg by mouth at bedtime.        . saxagliptin HCl (ONGLYZA) 5 MG TABS tablet Take 1 tablet (5 mg total) by mouth daily.  30 tablet  1  . Tamsulosin HCl (FLOMAX) 0.4 MG CAPS Take 1 capsule (0.4 mg total) by mouth daily.  30 capsule  3   No current facility-administered medications on file prior to visit.    BP 128/80  Pulse 64   Temp(Src) 98 F (36.7 C) (Oral)  Wt 163 lb (73.936 kg)  BMI 27.12 kg/m2       Objective:   Physical Exam  Constitutional: He is oriented to person, place, and time. He appears well-developed and well-nourished.  Cardiovascular: Normal rate, regular rhythm and normal heart sounds.   Pulmonary/Chest: Effort normal and breath sounds normal. He has no wheezes.  Neurological: He is alert and oriented to person, place, and time. No cranial nerve deficit.  Psychiatric: He has a normal mood and affect. His behavior is normal.          Assessment & Plan:

## 2012-07-04 ENCOUNTER — Other Ambulatory Visit: Payer: Self-pay | Admitting: *Deleted

## 2012-07-04 MED ORDER — SAXAGLIPTIN HCL 5 MG PO TABS: 5.0000 mg | ORAL_TABLET | Freq: Every day | ORAL | Status: DC

## 2012-08-02 ENCOUNTER — Other Ambulatory Visit: Payer: Self-pay | Admitting: *Deleted

## 2012-08-02 MED ORDER — HYDROCODONE-ACETAMINOPHEN 5-325 MG PO TABS: 1.0000 | ORAL_TABLET | Freq: Two times a day (BID) | ORAL | Status: DC | PRN

## 2012-08-31 ENCOUNTER — Other Ambulatory Visit (INDEPENDENT_AMBULATORY_CARE_PROVIDER_SITE_OTHER): Payer: PRIVATE HEALTH INSURANCE

## 2012-08-31 DIAGNOSIS — IMO0001 Reserved for inherently not codable concepts without codable children: Secondary | ICD-10-CM

## 2012-08-31 LAB — BASIC METABOLIC PANEL
Calcium: 9.3 mg/dL (ref 8.4–10.5)
Creatinine, Ser: 0.7 mg/dL (ref 0.4–1.5)
GFR: 111.44 mL/min (ref >60.00)

## 2012-08-31 LAB — HEMOGLOBIN A1C: Hgb A1c MFr Bld: 7.4 % — ABNORMAL HIGH (ref 4.6–6.5)

## 2012-08-31 LAB — VITAMIN B12: Vitamin B-12: >1500 pg/mL — ABNORMAL HIGH (ref 211–911)

## 2012-09-06 ENCOUNTER — Ambulatory Visit: Payer: PRIVATE HEALTH INSURANCE | Admitting: Internal Medicine

## 2012-09-08 ENCOUNTER — Other Ambulatory Visit: Payer: Self-pay

## 2012-10-24 ENCOUNTER — Ambulatory Visit (INDEPENDENT_AMBULATORY_CARE_PROVIDER_SITE_OTHER): Payer: PRIVATE HEALTH INSURANCE | Admitting: Internal Medicine

## 2012-10-24 ENCOUNTER — Encounter: Payer: Self-pay | Admitting: Internal Medicine

## 2012-10-24 VITALS — BP 140/82 | HR 72 | Temp 98.2°F | Wt 158.0 lb

## 2012-10-24 DIAGNOSIS — E119 Type 2 diabetes mellitus without complications: Secondary | ICD-10-CM

## 2012-10-24 DIAGNOSIS — Z23 Encounter for immunization: Secondary | ICD-10-CM

## 2012-10-24 DIAGNOSIS — R200 Anesthesia of skin: Secondary | ICD-10-CM

## 2012-10-24 DIAGNOSIS — R209 Unspecified disturbances of skin sensation: Secondary | ICD-10-CM

## 2012-10-24 DIAGNOSIS — I10 Essential (primary) hypertension: Secondary | ICD-10-CM

## 2012-10-24 MED ORDER — ATORVASTATIN CALCIUM 20 MG PO TABS: 20.0000 mg | ORAL_TABLET | Freq: Every day | ORAL | Status: DC

## 2012-10-24 MED ORDER — ALLOPURINOL 300 MG PO TABS: 300.0000 mg | ORAL_TABLET | Freq: Every day | ORAL | Status: DC

## 2012-10-24 MED ORDER — TAMSULOSIN HCL 0.4 MG PO CAPS: 0.4000 mg | ORAL_CAPSULE | Freq: Every day | ORAL | Status: DC

## 2012-10-24 MED ORDER — AMLODIPINE BESYLATE 10 MG PO TABS: 10.0000 mg | ORAL_TABLET | Freq: Every day | ORAL | Status: DC

## 2012-10-24 MED ORDER — METFORMIN HCL 500 MG PO TABS: 500.0000 mg | ORAL_TABLET | Freq: Three times a day (TID) | ORAL | Status: DC

## 2012-10-24 MED ORDER — RAMIPRIL 10 MG PO CAPS: 10.0000 mg | ORAL_CAPSULE | Freq: Every day | ORAL | Status: DC

## 2012-10-24 MED ORDER — METOPROLOL SUCCINATE ER 50 MG PO TB24: 50.0000 mg | ORAL_TABLET | Freq: Every day | ORAL | Status: DC

## 2012-10-24 MED ORDER — CLOPIDOGREL BISULFATE 75 MG PO TABS: 75.0000 mg | ORAL_TABLET | Freq: Every day | ORAL | Status: DC

## 2012-10-24 MED ORDER — HYDROCODONE-ACETAMINOPHEN 5-325 MG PO TABS: 1.0000 | ORAL_TABLET | Freq: Two times a day (BID) | ORAL | Status: DC | PRN

## 2012-10-24 NOTE — Assessment & Plan Note (Signed)
Patient's blood pressure is suboptimally controlled. Continue Altace 10 mg once daily. Increase amlodipine to 10 mg. Continue Toprol-XL 50 mg once daily. BP: 140/82 mmHg

## 2012-10-24 NOTE — Assessment & Plan Note (Signed)
No change in A1c. Onglyza is cost prohibitive. Increase metformin to 500 mg 3 times a day.

## 2012-10-24 NOTE — Assessment & Plan Note (Signed)
Patient likely has early diabetic neuropathy in his feet. However I suspect his numbness and tingling especially in his right hand secondary to carpal tunnel syndrome. Refer to Dr. Teressa Senter for further evaluation.

## 2012-10-24 NOTE — Patient Instructions (Signed)
Please complete the following lab tests before your next follow up appointment: BMET - 401.9 LFTs - 790.4

## 2012-10-24 NOTE — Progress Notes (Signed)
Subjective:    Patient ID: Kevin Mcclain, male    DOB: [birth year], 67 y.o.   MRN: [id number]  HPI  67 year old white male with history of type 2 diabetes, coronary artery disease and hypertension for followup. Since previous visit patient suffered a fall one week ago. He landed on his back and injured his right thigh. He has resolving bruise. He denies any serious injury.  Type 2 diabetes-patient could not continue Onglyza due to prohibitive cost. His A1c is unchanged.  Patient complains of chronic tingling especially worse in his hands (right greater than left).  Htn - good medication compliance.   Review of Systems Negative for chest pain or SOB    Past Medical History  Diagnosis Date  . Depression   . Arthritis   . Diabetes mellitus type II   . Kidney stones   . 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 Hypertension   . Hyperlipidemia   . Gout   . Vertigo     History   Social History  . Marital Status: Married    Spouse Name: N/A    Number of Children: N/A  . Years of Education: N/A   Occupational History  . Not on file.   Social History Main Topics  . Smoking status: Never Smoker   . Smokeless tobacco: Not on file  . Alcohol Use: No  . Drug Use: No  . Sexual Activity: Not on file   Other Topics Concern  . 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 Coronary artery bypass graft  2005    5 grafts  . Cholecystectomy      Family History  Problem Relation Age of Onset  . Heart attack Mother     deceased age 71 secondary  . Heart disease Mother   . Colon cancer Father   . Stroke Father     deceased age 39  . Heart disease Father   . Cancer Father     colon  . Stroke Sister 60  . Heart  disease Sister   . Coronary artery disease Brother     s/p bypass surgery  . Heart disease Brother   . Heart disease Brother     Allergies  Allergen Reactions  . Benzonatate   . Codeine   . Sitagliptin Phosphate     Current Outpatient Prescriptions on File Prior to Visit  Medication Sig Dispense Refill  . aspirin 325 MG tablet Take 325 mg by mouth daily.        . colchicine 0.6 MG tablet Take 1 tablet (0.6 mg total) by mouth daily as needed.  90 tablet  1  . desoximetasone (TOPICORT) 0.25 % cream Apply topically 2 (two) times daily.  30 g  5  . ipratropium (ATROVENT) 0.03 % nasal spray Place 2 sprays into the nose every 12 (twelve) hours.  30 mL  3  . nitroGLYCERIN (NITROSTAT) 0.4 MG SL tablet Place 1 tablet (0.4 mg total) under the tongue every 5 (five) minutes as needed.  50 tablet  11  . POTASSIUM CHLORIDE CR PO Take by mouth daily.        Marland Kitchen  ranitidine (ZANTAC) 75 MG tablet Take 75 mg by mouth at bedtime.         No current facility-administered medications on file prior to visit.    BP 140/82  Pulse 72  Temp(Src) 98.2 F (36.8 C) (Oral)  Wt 158 lb (71.668 kg)  BMI 26.29 kg/m2    Objective:   Physical Exam  Constitutional: He is oriented to person, place, and time. He appears well-developed and well-nourished.  HENT:  Head: Normocephalic and atraumatic.  Right Ear: External ear normal.  Left Ear: External ear normal.  Cardiovascular: Normal rate, regular rhythm and normal heart sounds.   No murmur heard. Pulmonary/Chest: Effort normal and breath sounds normal. He has no wheezes.  Abdominal: Soft. Bowel sounds are normal. There is no tenderness.  Musculoskeletal: He exhibits no edema.  Neurological: He is alert and oriented to person, place, and time. No cranial nerve deficit.  Positive phalen's test right wrist  Skin:  Oblong resolving bruise - right medial thigh  Psychiatric: He has a normal mood and affect. His behavior is normal.          Assessment &  Plan:

## 2012-11-01 ENCOUNTER — Other Ambulatory Visit: Payer: Self-pay | Admitting: Internal Medicine

## 2012-11-28 ENCOUNTER — Other Ambulatory Visit (INDEPENDENT_AMBULATORY_CARE_PROVIDER_SITE_OTHER): Payer: PRIVATE HEALTH INSURANCE

## 2012-11-28 DIAGNOSIS — E785 Hyperlipidemia, unspecified: Secondary | ICD-10-CM

## 2012-11-28 DIAGNOSIS — I10 Essential (primary) hypertension: Secondary | ICD-10-CM

## 2012-11-28 LAB — HEPATIC FUNCTION PANEL
ALT: 37 U/L (ref 0–53)
AST: 27 U/L (ref 0–37)
Alkaline Phosphatase: 57 U/L (ref 39–117)
Bilirubin, Direct: 0.2 mg/dL (ref 0.0–0.3)
Total Bilirubin: 1.3 mg/dL — ABNORMAL HIGH (ref 0.3–1.2)
Total Protein: 6.9 g/dL (ref 6.0–8.3)

## 2012-11-28 LAB — BASIC METABOLIC PANEL
Calcium: 9.4 mg/dL (ref 8.4–10.5)
GFR: 85.55 mL/min (ref >60.00)
Sodium: 137 mEq/L (ref 135–145)

## 2012-11-30 ENCOUNTER — Other Ambulatory Visit: Payer: Self-pay | Admitting: Internal Medicine

## 2012-12-05 ENCOUNTER — Ambulatory Visit: Payer: PRIVATE HEALTH INSURANCE | Admitting: Internal Medicine

## 2012-12-06 ENCOUNTER — Ambulatory Visit (INDEPENDENT_AMBULATORY_CARE_PROVIDER_SITE_OTHER): Payer: PRIVATE HEALTH INSURANCE | Admitting: Internal Medicine

## 2012-12-06 ENCOUNTER — Encounter: Payer: Self-pay | Admitting: Internal Medicine

## 2012-12-06 VITALS — BP 114/62 | HR 72 | Temp 98.0°F | Wt 160.0 lb

## 2012-12-06 DIAGNOSIS — I10 Essential (primary) hypertension: Secondary | ICD-10-CM

## 2012-12-06 DIAGNOSIS — E1149 Type 2 diabetes mellitus with other diabetic neurological complication: Secondary | ICD-10-CM

## 2012-12-06 DIAGNOSIS — M545 Low back pain, unspecified: Secondary | ICD-10-CM

## 2012-12-06 DIAGNOSIS — E1142 Type 2 diabetes mellitus with diabetic polyneuropathy: Secondary | ICD-10-CM

## 2012-12-06 MED ORDER — HYDROCODONE-ACETAMINOPHEN 5-325 MG PO TABS: 1.0000 | ORAL_TABLET | Freq: Two times a day (BID) | ORAL | Status: DC | PRN

## 2012-12-06 NOTE — Patient Instructions (Signed)
Please complete the following lab tests before your next follow up appointment:  BMET, A1c - 250.02 

## 2012-12-06 NOTE — Progress Notes (Signed)
Subjective:    Patient ID: Kevin Mcclain, male    DOB: Nov 23, 1945, 67 y.o.   MRN: [id number]  HPI  67 year old white male with history of coronary artery disease, type 2 diabetes and chronic back pain for followup. At previous visit patient's amlodipine increased for suboptimally controlled blood pressure. He is tolerating well and his blood pressure has improved. He denies any dizziness or lightheadedness.  He is also tolerating higher dose of metformin. He is taking 500 mg 3 times daily. He denies any loose stools or diarrhea.   Review of Systems He has chronic intermittent back pain and headaches. He uses hydrocodone as needed.    Past Medical History  Diagnosis Date  . Depression   . Arthritis   . Diabetes mellitus type II   . Kidney stones   . CAD (coronary artery disease)     severe CAD-s/p MI 2000 with PTCA, s/p CABG 2005-by Dr Nathaniel Man at [facility] Last EF 50-55%  . Hypertension   . Hyperlipidemia   . Gout   . Vertigo     History   Social History  . Marital Status: Married    Spouse Name: N/A    Number of Children: N/A  . Years of Education: N/A   Occupational History  . Not on file.   Social History Main Topics  . Smoking status: Never Smoker   . Smokeless tobacco: Not on file  . Alcohol Use: No  . Drug Use: No  . Sexual Activity: Not on file   Other Topics Concern  . 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 Coronary artery bypass graft  2005    5 grafts  . Cholecystectomy      Family History  Problem Relation Age of Onset  . Heart attack Mother     deceased age 37 secondary  . Heart disease Mother   . Colon cancer Father   . Stroke Father     deceased age 75  . Heart disease Father   . Cancer Father     colon  . Stroke Sister 4  . Heart disease Sister   .  Coronary artery disease Brother     s/p bypass surgery  . Heart disease Brother   . Heart disease Brother     Allergies  Allergen Reactions  . Benzonatate   . Codeine   . Sitagliptin Phosphate     Current Outpatient Prescriptions on File Prior to Visit  Medication Sig Dispense Refill  . allopurinol (ZYLOPRIM) 300 MG tablet Take 1 tablet (300 mg total) by mouth daily.  90 tablet  1  . amLODipine (NORVASC) 10 MG tablet Take 1 tablet (10 mg total) by mouth daily.  90 tablet  1  . aspirin 325 MG tablet Take 325 mg by mouth daily.        Marland Kitchen atorvastatin (LIPITOR) 20 MG tablet TAKE 1 TABLET BY MOUTH ONCE DAILY  90 tablet  1  . clopidogrel (PLAVIX) 75 MG tablet Take 1 tablet (75 mg total) by mouth daily.  90 tablet  1  . colchicine 0.6 MG tablet Take 1 tablet (0.6 mg total) by mouth daily as needed.  90 tablet  1  . desoximetasone (TOPICORT)  0.25 % cream APPLY TOPICALLY TWICE DAILY  30 g  5  . ipratropium (ATROVENT) 0.03 % nasal spray Place 2 sprays into the nose every 12 (twelve) hours.  30 mL  3  . metFORMIN (GLUCOPHAGE) 500 MG tablet Take 1 tablet (500 mg total) by mouth 3 (three) times daily with meals.  270 tablet  1  . metoprolol succinate (TOPROL-XL) 50 MG 24 hr tablet Take 1 tablet (50 mg total) by mouth daily. Take with or immediately following a meal.  90 tablet  1  . nitroGLYCERIN (NITROSTAT) 0.4 MG SL tablet Place 1 tablet (0.4 mg total) under the tongue every 5 (five) minutes as needed.  50 tablet  11  . POTASSIUM CHLORIDE CR PO Take by mouth daily.        . ramipril (ALTACE) 5 MG capsule TAKE 2 CAPSULES BY MOUTH EVERY DAY  180 capsule  0  . ranitidine (ZANTAC) 75 MG tablet Take 75 mg by mouth at bedtime.        . tamsulosin (FLOMAX) 0.4 MG CAPS capsule Take 1 capsule (0.4 mg total) by mouth daily.  90 capsule  1   No current facility-administered medications on file prior to visit.    BP 114/62  Pulse 72  Temp(Src) 98 F (36.7 C) (Oral)  Wt 160 lb (72.576 kg)  BMI 26.63  kg/m2    Objective:   Physical Exam  Constitutional: He is oriented to person, place, and time. He appears well-developed and well-nourished.  Cardiovascular: Normal rate, regular rhythm and normal heart sounds.   No murmur heard. Pulmonary/Chest: Effort normal and breath sounds normal. He has no wheezes.  Musculoskeletal: He exhibits no edema.  Neurological: He is alert and oriented to person, place, and time. No cranial nerve deficit.  Psychiatric: He has a normal mood and affect. His behavior is normal.          Assessment & Plan:

## 2012-12-06 NOTE — Assessment & Plan Note (Signed)
Chronic intermittent low back pain.  Use hydrocodone as needed.

## 2012-12-06 NOTE — Assessment & Plan Note (Signed)
Blood sugars improving with higher dose of metformin.  I encouraged patient adhere to carb modified diet.  Monitor A1c before next OV.

## 2012-12-06 NOTE — Assessment & Plan Note (Signed)
Improved with higher dose of amlodipine. Electrolytes and kidney function are stable. BP: 114/62 mmHg  Lab Results  Component Value Date   CREATININE 0.9 11/28/2012   Lab Results  Component Value Date   NA 137 11/28/2012   K 4.4 11/28/2012   CL 107 11/28/2012   CO2 26 11/28/2012

## 2013-01-29 ENCOUNTER — Other Ambulatory Visit: Payer: Self-pay | Admitting: Internal Medicine

## 2013-02-27 ENCOUNTER — Other Ambulatory Visit: Payer: Self-pay | Admitting: *Deleted

## 2013-02-27 MED ORDER — ATORVASTATIN CALCIUM 20 MG PO TABS: ORAL_TABLET | ORAL | Status: DC

## 2013-02-27 MED ORDER — HYDROCODONE-ACETAMINOPHEN 5-325 MG PO TABS: 1.0000 | ORAL_TABLET | Freq: Two times a day (BID) | ORAL | Status: DC | PRN

## 2013-03-02 HISTORY — PX: COLONOSCOPY W/ POLYPECTOMY: SHX1380

## 2013-03-03 ENCOUNTER — Other Ambulatory Visit (INDEPENDENT_AMBULATORY_CARE_PROVIDER_SITE_OTHER): Payer: PRIVATE HEALTH INSURANCE

## 2013-03-03 DIAGNOSIS — I10 Essential (primary) hypertension: Secondary | ICD-10-CM

## 2013-03-03 DIAGNOSIS — IMO0001 Reserved for inherently not codable concepts without codable children: Secondary | ICD-10-CM

## 2013-03-03 DIAGNOSIS — E1165 Type 2 diabetes mellitus with hyperglycemia: Principal | ICD-10-CM

## 2013-03-03 LAB — BASIC METABOLIC PANEL
BUN: 14 mg/dL (ref 6–23)
CALCIUM: 9.2 mg/dL (ref 8.4–10.5)
CO2: 27 mEq/L (ref 19–32)
CREATININE: 0.9 mg/dL (ref 0.4–1.5)
Chloride: 106 mEq/L (ref 96–112)
GFR: 86.55 mL/min (ref >60.00)
GLUCOSE: 109 mg/dL — AB (ref 70–99)
Potassium: 4.6 mEq/L (ref 3.5–5.1)
Sodium: 140 mEq/L (ref 135–145)

## 2013-03-03 LAB — HEMOGLOBIN A1C: Hgb A1c MFr Bld: 5.8 % (ref 4.6–6.5)

## 2013-03-09 ENCOUNTER — Ambulatory Visit: Payer: PRIVATE HEALTH INSURANCE | Admitting: Internal Medicine

## 2013-03-10 ENCOUNTER — Encounter: Payer: Self-pay | Admitting: Internal Medicine

## 2013-03-10 ENCOUNTER — Ambulatory Visit (INDEPENDENT_AMBULATORY_CARE_PROVIDER_SITE_OTHER): Payer: PRIVATE HEALTH INSURANCE | Admitting: Internal Medicine

## 2013-03-10 VITALS — BP 118/70 | HR 72 | Temp 98.5°F | Ht 65.0 in | Wt 158.0 lb

## 2013-03-10 DIAGNOSIS — E1149 Type 2 diabetes mellitus with other diabetic neurological complication: Secondary | ICD-10-CM

## 2013-03-10 DIAGNOSIS — M129 Arthropathy, unspecified: Secondary | ICD-10-CM | POA: Insufficient documentation

## 2013-03-10 DIAGNOSIS — E1142 Type 2 diabetes mellitus with diabetic polyneuropathy: Secondary | ICD-10-CM

## 2013-03-10 DIAGNOSIS — I10 Essential (primary) hypertension: Secondary | ICD-10-CM

## 2013-03-10 DIAGNOSIS — M109 Gout, unspecified: Secondary | ICD-10-CM

## 2013-03-10 LAB — CBC WITH DIFFERENTIAL/PLATELET
BASOS PCT: 0.8 % (ref 0.0–3.0)
Basophils Absolute: 0.1 10*3/uL (ref 0.0–0.1)
EOS PCT: 2.7 % (ref 0.0–5.0)
Eosinophils Absolute: 0.3 10*3/uL (ref 0.0–0.7)
HEMATOCRIT: 45.7 % (ref 39.0–52.0)
HEMOGLOBIN: 15.6 g/dL (ref 13.0–17.0)
LYMPHS ABS: 2.4 10*3/uL (ref 0.7–4.0)
Lymphocytes Relative: 24.8 % (ref 12.0–46.0)
MCHC: 34 g/dL (ref 30.0–36.0)
MCV: 91 fl (ref 78.0–100.0)
MONOS PCT: 7.7 % (ref 3.0–12.0)
Monocytes Absolute: 0.7 10*3/uL (ref 0.1–1.0)
NEUTROS ABS: 6.2 10*3/uL (ref 1.4–7.7)
Neutrophils Relative %: 64 % (ref 43.0–77.0)
Platelets: 232 10*3/uL (ref 150.0–400.0)
RBC: 5.03 Mil/uL (ref 4.22–5.81)
RDW: 13.4 % (ref 11.5–14.6)
WBC: 9.6 10*3/uL (ref 4.5–10.5)

## 2013-03-10 LAB — SEDIMENTATION RATE: Sed Rate: 4 mm/hr (ref 0–22)

## 2013-03-10 LAB — URIC ACID: Uric Acid, Serum: 3.3 mg/dL — ABNORMAL LOW (ref 4.0–7.8)

## 2013-03-10 LAB — BASIC METABOLIC PANEL
BUN: 12 mg/dL (ref 6–23)
CALCIUM: 9.7 mg/dL (ref 8.4–10.5)
CO2: 26 mEq/L (ref 19–32)
Chloride: 108 mEq/L (ref 96–112)
Creatinine, Ser: 0.9 mg/dL (ref 0.4–1.5)
GFR: 88.77 mL/min (ref >60.00)
GLUCOSE: 95 mg/dL (ref 70–99)
Potassium: 4.5 mEq/L (ref 3.5–5.1)
SODIUM: 142 meq/L (ref 135–145)

## 2013-03-10 MED ORDER — RAMIPRIL 5 MG PO CAPS: ORAL_CAPSULE | ORAL | Status: DC

## 2013-03-10 MED ORDER — CLOPIDOGREL BISULFATE 75 MG PO TABS: 75.0000 mg | ORAL_TABLET | Freq: Every day | ORAL | Status: DC

## 2013-03-10 MED ORDER — DESOXIMETASONE 0.25 % EX CREA: TOPICAL_CREAM | CUTANEOUS | Status: DC

## 2013-03-10 MED ORDER — ALLOPURINOL 300 MG PO TABS: 300.0000 mg | ORAL_TABLET | Freq: Every day | ORAL | Status: DC

## 2013-03-10 MED ORDER — METFORMIN HCL 500 MG PO TABS: 500.0000 mg | ORAL_TABLET | Freq: Three times a day (TID) | ORAL | Status: DC

## 2013-03-10 MED ORDER — AMLODIPINE BESYLATE 10 MG PO TABS: 10.0000 mg | ORAL_TABLET | Freq: Every day | ORAL | Status: DC

## 2013-03-10 MED ORDER — SULINDAC 200 MG PO TABS: 100.0000 mg | ORAL_TABLET | Freq: Every day | ORAL | Status: DC | PRN

## 2013-03-10 MED ORDER — METOPROLOL SUCCINATE ER 50 MG PO TB24: 50.0000 mg | ORAL_TABLET | Freq: Every day | ORAL | Status: DC

## 2013-03-10 MED ORDER — NITROGLYCERIN 0.4 MG SL SUBL: 0.4000 mg | SUBLINGUAL_TABLET | SUBLINGUAL | Status: DC | PRN

## 2013-03-10 NOTE — Assessment & Plan Note (Signed)
Improved with higher dose of metformin 500 mg with AM meal and 1000 with PM meal.   Lab Results  Component Value Date   HGBA1C 5.8 03/03/2013

## 2013-03-10 NOTE — Assessment & Plan Note (Signed)
Patient has unexplained left foot pain. He has history of gout however his uric acid levels have been well-controlled (less than 4 in the past) I doubt his symptoms secondary gout flare. 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 Also consider systemic cause of intermittent joint pains. He has history of mild elevations in liver function tests and reports decrease in libido. Rule out hemachromatosis. Obtain genetic testing for hemachromatosis.  If workup is negative, we discussed referral to rheumatology for further evaluation. Use anti-inflammatory-sulindac 200 mg one half tablet once daily as needed. Use sparingly considering diabetes  and  ACE inhibitor use.

## 2013-03-10 NOTE — Progress Notes (Signed)
Pre visit review using our clinic review tool, if applicable. No additional management support is needed unless otherwise documented below in the visit note. 

## 2013-03-10 NOTE — Assessment & Plan Note (Signed)
Well controlled.  No change in medication.  BP: 118/70 mmHg  Lab Results  Component Value Date   CREATININE 0.9 03/10/2013

## 2013-03-10 NOTE — Progress Notes (Signed)
Subjective:    Patient ID: Kevin Mcclain, male    DOB: 01-17-1946, 68 y.o.   MRN: [id number]  HPI  68 year old white male with history of coronary artery disease, hypertension and gout for followup. In terms of his diabetes, patient's blood sugars have been well-controlled. He is taking 500 mg metformin the morning and 2 tablets of metformin the evening. He is not experiencing any adverse gastrointestinal side effects.  Unfortunately he continues to have intermittent joint pains. This time, his pain was localized to left great toe/left foot. Patient reports during acute phase of his symptoms, left foot was red and swollen. He had significant pain which was not controlled colchicine or Vicodin. He takes his allopurinol 300 mg once daily. His previous uric acid levels have been less than 4.  Hypertension - Patient reports monitoring his blood pressure at home. His blood pressure readings are well-controlled.  Review of Systems Negative for fever or chills, negative for chest pain,  Decreased libido, history of abnormal liver function tests in the past.  He also reports persistent intermittent tingling in finger tips.  Past Medical History  Diagnosis Date  . Depression   . Arthritis   . Diabetes mellitus type II   . Kidney stones   . CAD (coronary artery disease)     severe CAD-s/p MI 2000 with PTCA, s/p CABG 2005-by Dr Vanetta Mulders at [place] EF 50-55%  . Hypertension   . Hyperlipidemia   . Gout   . Vertigo     History   Social History  . Marital Status: Married    Spouse Name: N/A    Number of Children: N/A  . Years of Education: N/A   Occupational History  . Not on file.   Social History Main Topics  . Smoking status: Never Smoker   . Smokeless tobacco: Not on file  . Alcohol Use: No  . Drug Use: No  . Sexual Activity: Not on file   Other Topics Concern  . Not on file   Social History Narrative   Occupation :  Arboriculturist for [employer].       Married   Tobacco use - no   Alcohol use-no             Past Surgical History  Procedure Laterality Date  . Coronary artery bypass graft  2005    5 grafts  . Cholecystectomy      Family History  Problem Relation Age of Onset  . Heart attack Mother     deceased age 27 secondary  . Heart disease Mother   . Colon cancer Father   . Stroke Father     deceased age 44  . Heart disease Father   . Cancer Father     colon  . Stroke Sister 82  . Heart disease Sister   . Coronary artery disease Brother     s/p bypass surgery  . Heart disease Brother   . Heart disease Brother     Allergies  Allergen Reactions  . Benzonatate   . Codeine   . Sitagliptin Phosphate     Current Outpatient Prescriptions on File Prior to Visit  Medication Sig Dispense Refill  . aspirin 325 MG tablet Take 325 mg by mouth daily.        Marland Kitchen atorvastatin (LIPITOR) 20 MG tablet TAKE 1 TABLET BY MOUTH ONCE DAILY  90 tablet  1  . colchicine 0.6 MG tablet Take 1 tablet (0.6 mg total)  by mouth daily as needed.  90 tablet  1  . HYDROcodone-acetaminophen (NORCO/VICODIN) 5-325 MG per tablet Take 1 tablet by mouth every 12 (twelve) hours as needed.  60 tablet  0  . ipratropium (ATROVENT) 0.03 % nasal spray Place 2 sprays into the nose every 12 (twelve) hours.  30 mL  3  . POTASSIUM CHLORIDE CR PO Take by mouth daily.        . ranitidine (ZANTAC) 75 MG tablet Take 75 mg by mouth at bedtime.        . tamsulosin (FLOMAX) 0.4 MG CAPS capsule Take 1 capsule (0.4 mg total) by mouth daily.  90 capsule  1   No current facility-administered medications on file prior to visit.    BP 118/70  Pulse 72  Temp(Src) 98.5 F (36.9 C) (Oral)  Ht 5\' 5"  (1.651 m)  Wt 158 lb (71.668 kg)  BMI 26.29 kg/m2       Objective:   Physical Exam  Constitutional: He is oriented to person, place, and time. He appears well-developed and well-nourished. No distress.  HENT:  Head: Normocephalic and atraumatic.  Neck: Neck supple.   No carotid bruit  Cardiovascular: Normal rate, regular rhythm and normal heart sounds.   No murmur heard. Pulmonary/Chest: Effort normal and breath sounds normal. He has no wheezes.  Musculoskeletal: He exhibits no edema.  No redness or swelling of left great toe. No tenderness of left first metatarsal joint  Neurological: He is alert and oriented to person, place, and time. No cranial nerve deficit.  Skin: Skin is warm and dry.  Psychiatric: He has a normal mood and affect. His behavior is normal.          Assessment & Plan:

## 2013-03-11 LAB — RHEUMATOID FACTOR: Rhuematoid fact SerPl-aCnc: <10 IU/mL (ref <=14)

## 2013-03-16 ENCOUNTER — Telehealth: Payer: Self-pay

## 2013-03-16 ENCOUNTER — Telehealth: Payer: Self-pay | Admitting: *Deleted

## 2013-03-16 LAB — HEMOCHROMATOSIS DNA-PCR(C282Y,H63D): DNA MUTATION ANALYSIS: NOT DETECTED

## 2013-03-16 NOTE — Telephone Encounter (Signed)
Message copied by Pearletha Forge on Thu Mar 16, 2013  9:56 AM ------      Message from: Rosine Abe      Created: Fri Mar 10, 2013  9:49 PM       Call patient - Who is his ophthalmologist and when was his last diabetic eye exam. If greater than one year I suggest followup with ophthalmologist. If he would like recommendation, I suggest referral to [facility] ophthalmology. ------

## 2013-03-16 NOTE — Telephone Encounter (Signed)
Relevant patient education mailed to patient.  

## 2013-03-16 NOTE — Telephone Encounter (Signed)
Pt does not have an ophthalmologist.  He has not had an eye exam in over a year.  Gave him [facility] ophthalmology's phone number and told him to call and make him appt.  Pt agreed.  Pt will have them forward note to Dr Shawna Orleans

## 2013-03-20 ENCOUNTER — Other Ambulatory Visit: Payer: Self-pay | Admitting: Internal Medicine

## 2013-03-20 DIAGNOSIS — M1120 Other chondrocalcinosis, unspecified site: Secondary | ICD-10-CM

## 2013-03-20 DIAGNOSIS — M79672 Pain in left foot: Secondary | ICD-10-CM

## 2013-03-24 ENCOUNTER — Encounter: Payer: Self-pay | Admitting: Internal Medicine

## 2013-03-28 ENCOUNTER — Ambulatory Visit (INDEPENDENT_AMBULATORY_CARE_PROVIDER_SITE_OTHER)
Admission: RE | Admit: 2013-03-28 | Discharge: 2013-03-28 | Disposition: A | Discharge status: Home / Self Care | Payer: PRIVATE HEALTH INSURANCE | Source: Ambulatory Visit | Attending: Internal Medicine | Admitting: Internal Medicine

## 2013-03-28 DIAGNOSIS — M129 Arthropathy, unspecified: Secondary | ICD-10-CM

## 2013-04-14 ENCOUNTER — Telehealth: Payer: Self-pay | Admitting: *Deleted

## 2013-04-14 NOTE — Telephone Encounter (Signed)
Colchicine is causing diarrhea.  He should stop taking and try restart in 1 week at 1/2 dose.

## 2013-04-14 NOTE — Telephone Encounter (Signed)
Pt aware, he has already stopped it (Wednesday).  The diarrhea is better

## 2013-04-14 NOTE — Telephone Encounter (Signed)
Pt left message on voicemail stating that his rheumatologist told him to take allopurinol 400 mg and colchicine once daily.  Every since then he has had diarrhea.  Tired to call pt back no answer, left message

## 2013-04-19 ENCOUNTER — Encounter: Payer: PRIVATE HEALTH INSURANCE | Admitting: Neurology

## 2013-05-10 ENCOUNTER — Encounter: Payer: Self-pay | Admitting: Internal Medicine

## 2013-05-10 ENCOUNTER — Ambulatory Visit (INDEPENDENT_AMBULATORY_CARE_PROVIDER_SITE_OTHER): Payer: PRIVATE HEALTH INSURANCE | Admitting: Internal Medicine

## 2013-05-10 ENCOUNTER — Telehealth: Payer: Self-pay | Admitting: Internal Medicine

## 2013-05-10 VITALS — BP 138/64 | HR 72 | Temp 97.8°F | Ht 65.0 in | Wt 156.0 lb

## 2013-05-10 DIAGNOSIS — I10 Essential (primary) hypertension: Secondary | ICD-10-CM

## 2013-05-10 DIAGNOSIS — M129 Arthropathy, unspecified: Secondary | ICD-10-CM

## 2013-05-10 DIAGNOSIS — M545 Low back pain, unspecified: Secondary | ICD-10-CM

## 2013-05-10 DIAGNOSIS — E1149 Type 2 diabetes mellitus with other diabetic neurological complication: Secondary | ICD-10-CM

## 2013-05-10 DIAGNOSIS — I2510 Atherosclerotic heart disease of native coronary artery without angina pectoris: Secondary | ICD-10-CM

## 2013-05-10 MED ORDER — HYDROCODONE-ACETAMINOPHEN 5-325 MG PO TABS: 1.0000 | ORAL_TABLET | Freq: Two times a day (BID) | ORAL | Status: DC | PRN

## 2013-05-10 MED ORDER — SULINDAC 200 MG PO TABS: 100.0000 mg | ORAL_TABLET | Freq: Every day | ORAL | Status: DC | PRN

## 2013-05-10 NOTE — Telephone Encounter (Signed)
Relevant patient education mailed to patient.  

## 2013-05-10 NOTE — Assessment & Plan Note (Signed)
No change in chronic low back pain.  Use vicodin as needed.

## 2013-05-10 NOTE — Assessment & Plan Note (Addendum)
Excellent control.  Continue metformin and dietary management.

## 2013-05-10 NOTE — Assessment & Plan Note (Signed)
Patient has history of previous angioplasty. We discussed higher risk of bleeding with aspirin, Plavix and use of NSAIDs. Discontinue Plavix. Continue full dose aspirin.

## 2013-05-10 NOTE — Assessment & Plan Note (Addendum)
Patient was seen by rheumatologist. Despite low uric acid levels, his symptoms felt to be secondary to gout flare. Unfortunately patient did not have good response to colchicine. He had to discontinue secondary to diarrhea.  Good response to sulindac one half tablet of 200 mg every 3 days as needed. Patient understands to use NSAIDs sparingly.

## 2013-05-10 NOTE — Progress Notes (Signed)
Subjective:    Patient ID: Kevin Mcclain, male    DOB: [birth year], 68 y.o.   MRN: [id number]  HPI  68 year old white male with history of coronary artery disease, hypertension, type 2 diabetes and gout for followup. Interval medical history-patient was seen by rheumatologist. His hand and wrist pain felt to be secondary to gout exacerbation. His allopurinol dose was increased and he was advised to take colchicine daily. Patient discontinued colchicine due to side effect of diarrhea. Patient also reports no significant relief and joint symptoms after taking daily colchicine.  He is currently taking quarter to half tablet of sulindac 200 mg as needed. He usually takes 1/2 tablet every 3 days. Patient reports joint pains have significantly improved.  Type 2 diabetes-stable  Coronary artery disease-patient has had angioplasty in the past no stents. He is currently on aspirin and Plavix.  Chronic low back pain - unchanged.  His symptoms intermittent.   Review of Systems Negative for chest pain.  No fever or chills.    Past Medical History  Diagnosis Date  . Depression   . Arthritis   . Diabetes mellitus type II   . Kidney stones   . 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 Hypertension   . Hyperlipidemia   . Gout   . Vertigo     History   Social History  . Marital Status: Married    Spouse Name: N/A    Number of Children: N/A  . Years of Education: N/A   Occupational History  . Not on file.   Social History Main Topics  . Smoking status: Never Smoker   . Smokeless tobacco: Not on file  . Alcohol Use: No  . Drug Use: No  . Sexual Activity: Not on file   Other Topics Concern  . 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 Coronary artery bypass graft  2005    5 grafts  . Cholecystectomy      Family History  Problem Relation Age of Onset  . Heart attack Mother     deceased age 57 secondary  . Heart disease Mother   . Colon cancer Father   . Stroke Father     deceased age 67  . Heart disease Father   . Cancer Father     colon  . Stroke Sister 40  . Heart disease Sister   . Coronary artery disease Brother     s/p bypass surgery  . Heart disease Brother   . Heart disease Brother     Allergies  Allergen Reactions  . Benzonatate   . Codeine   . Sitagliptin Phosphate     Current Outpatient Prescriptions on File Prior to Visit  Medication Sig Dispense Refill  . allopurinol (ZYLOPRIM) 300 MG tablet Take 1 tablet (300 mg total) by mouth daily.  90 tablet  1  . amLODipine (NORVASC) 10 MG tablet Take 1 tablet (10 mg total) by mouth daily.  90 tablet  1  . aspirin 325 MG tablet Take 325 mg by mouth daily.        Marland Kitchen atorvastatin (  LIPITOR) 20 MG tablet TAKE 1 TABLET BY MOUTH ONCE DAILY  90 tablet  1  . clopidogrel (PLAVIX) 75 MG tablet Take 1 tablet (75 mg total) by mouth daily.  90 tablet  1  . colchicine 0.6 MG tablet Take 1 tablet (0.6 mg total) by mouth daily as needed.  90 tablet  1  . desoximetasone (TOPICORT) 0.25 % cream APPLY TOPICALLY TWICE DAILY  30 g  5  . HYDROcodone-acetaminophen (NORCO/VICODIN) 5-325 MG per tablet Take 1 tablet by mouth every 12 (twelve) hours as needed.  60 tablet  0  . ipratropium (ATROVENT) 0.03 % nasal spray Place 2 sprays into the nose every 12 (twelve) hours.  30 mL  3  . metFORMIN (GLUCOPHAGE) 500 MG tablet Take 1 tablet (500 mg total) by mouth 3 (three) times daily with meals.  270 tablet  1  . metoprolol succinate (TOPROL-XL) 50 MG 24 hr tablet Take 1 tablet (50 mg total) by mouth daily. Take with or immediately following a meal.  90 tablet  1  . nitroGLYCERIN (NITROSTAT) 0.4 MG SL tablet Place 1 tablet (0.4 mg total) under the tongue every 5  (five) minutes as needed.  50 tablet  11  . POTASSIUM CHLORIDE CR PO Take by mouth daily.        . ramipril (ALTACE) 5 MG capsule TAKE 2 CAPSULES BY MOUTH EVERY DAY  180 capsule  1  . ranitidine (ZANTAC) 75 MG tablet Take 75 mg by mouth at bedtime.        . sulindac (CLINORIL) 200 MG tablet Take 0.5 tablets (100 mg total) by mouth daily as needed.  30 tablet  0  . tamsulosin (FLOMAX) 0.4 MG CAPS capsule Take 1 capsule (0.4 mg total) by mouth daily.  90 capsule  1   No current facility-administered medications on file prior to visit.    BP 138/64  Pulse 72  Temp(Src) 97.8 F (36.6 C) (Oral)  Ht 5\' 5"  (1.651 m)  Wt 156 lb (70.761 kg)  BMI 25.96 kg/m2    Objective:   Physical Exam  Constitutional: He is oriented to person, place, and time. He appears well-developed and well-nourished. No distress.  HENT:  Head: Normocephalic and atraumatic.  Neck: Neck supple.  Cardiovascular: Normal rate, regular rhythm and normal heart sounds.   No murmur heard. Pulmonary/Chest: Effort normal and breath sounds normal. He has no wheezes.  Musculoskeletal: He exhibits no edema.  No joint swelling of wrists or hands  Lymphadenopathy:    He has no cervical adenopathy.  Neurological: He is alert and oriented to person, place, and time. No cranial nerve deficit.  Skin: Skin is warm and dry.  Psychiatric: He has a normal mood and affect. His behavior is normal.          Assessment & Plan:

## 2013-05-10 NOTE — Progress Notes (Signed)
Pre visit review using our clinic review tool, if applicable. No additional management support is needed unless otherwise documented below in the visit note. 

## 2013-05-10 NOTE — Assessment & Plan Note (Signed)
Stable.  Monitor electrolytes and kidney function. BP: 138/64 mmHg

## 2013-05-10 NOTE — Patient Instructions (Addendum)
Please complete the following lab tests before your next follow up appointment: BMET, A1c - 250.00 FLP, LFTs - 272.4 

## 2013-07-20 ENCOUNTER — Other Ambulatory Visit: Payer: Self-pay | Admitting: *Deleted

## 2013-07-20 MED ORDER — METOPROLOL SUCCINATE ER 50 MG PO TB24: 50.0000 mg | ORAL_TABLET | Freq: Every day | ORAL | Status: DC

## 2013-07-20 MED ORDER — AMLODIPINE BESYLATE 10 MG PO TABS: 10.0000 mg | ORAL_TABLET | Freq: Every day | ORAL | Status: DC

## 2013-07-20 MED ORDER — RAMIPRIL 5 MG PO CAPS: ORAL_CAPSULE | ORAL | Status: DC

## 2013-07-20 MED ORDER — METFORMIN HCL 500 MG PO TABS: 500.0000 mg | ORAL_TABLET | Freq: Three times a day (TID) | ORAL | Status: DC

## 2013-07-20 MED ORDER — HYDROCODONE-ACETAMINOPHEN 5-325 MG PO TABS: 1.0000 | ORAL_TABLET | Freq: Two times a day (BID) | ORAL | Status: DC | PRN

## 2013-07-20 MED ORDER — SULINDAC 200 MG PO TABS: 100.0000 mg | ORAL_TABLET | Freq: Every day | ORAL | Status: DC | PRN

## 2013-07-20 MED ORDER — ALLOPURINOL 300 MG PO TABS: 300.0000 mg | ORAL_TABLET | Freq: Every day | ORAL | Status: DC

## 2013-08-16 ENCOUNTER — Ambulatory Visit (INDEPENDENT_AMBULATORY_CARE_PROVIDER_SITE_OTHER): Payer: PRIVATE HEALTH INSURANCE | Admitting: Internal Medicine

## 2013-08-16 ENCOUNTER — Encounter: Payer: Self-pay | Admitting: Internal Medicine

## 2013-08-16 VITALS — BP 120/76 | HR 85 | Temp 98.2°F | Wt 154.0 lb

## 2013-08-16 DIAGNOSIS — J329 Chronic sinusitis, unspecified: Secondary | ICD-10-CM

## 2013-08-16 NOTE — Progress Notes (Signed)
Subjective:    Patient ID: Kevin Mcclain, male    DOB: 23-Aug-1945, 68 y.o.   MRN: [id number]  HPI  68 year old white male with history of coronary artery disease, hypertension and type 2 diabetes complains of one week of productive cough and lethargy. He is accompanied by his supportive wife. Patient reports she is coughing up green sputum and when he blows his nose there is a small amount of blood and yellowish mucus. He has experienced chills but denies any fever. His wife reports he hasn't "gotten off the couch" within the last week.   Review of Systems Occasional loose stool,  Chills but no fever    Past Medical History  Diagnosis Date  . Depression   . Arthritis   . Diabetes mellitus type II   . Kidney stones   . CAD (coronary artery disease)     severe CAD-s/p MI 2000 with PTCA, s/p CABG 2005-by Dr Vanetta Mulders at [place] EF 50-55%  . Hypertension   . Hyperlipidemia   . Gout   . Vertigo     History   Social History  . Marital Status: Married    Spouse Name: N/A    Number of Children: N/A  . Years of Education: N/A   Occupational History  . Not on file.   Social History Main Topics  . Smoking status: Never Smoker   . Smokeless tobacco: Not on file  . Alcohol Use: No  . Drug Use: No  . Sexual Activity: Not on file   Other Topics Concern  . 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 Coronary artery bypass graft  2005    5 grafts  . Cholecystectomy      Family History  Problem Relation Age of Onset  . Heart attack Mother     deceased age 42 secondary  . Heart disease Mother   . Colon cancer Father   . Stroke Father     deceased age 59  . Heart disease Father   . Cancer Father     colon  . Stroke Sister 23  . Heart disease Sister   . Coronary artery disease Brother       s/p bypass surgery  . Heart disease Brother   . Heart disease Brother     Allergies  Allergen Reactions  . Benzonatate   . Codeine   . Sitagliptin Phosphate     Current Outpatient Prescriptions on File Prior to Visit  Medication Sig Dispense Refill  . allopurinol (ZYLOPRIM) 300 MG tablet Take 1 tablet (300 mg total) by mouth daily.  90 tablet  1  . amLODipine (NORVASC) 10 MG tablet Take 1 tablet (10 mg total) by mouth daily.  90 tablet  1  . aspirin 325 MG tablet Take 325 mg by mouth daily.        Marland Kitchen atorvastatin (LIPITOR) 20 MG tablet TAKE 1 TABLET BY MOUTH ONCE DAILY  90 tablet  1  . colchicine 0.6 MG tablet Take 1 tablet (0.6 mg total) by mouth daily as needed.  90 tablet  1  . desoximetasone (TOPICORT) 0.25 % cream APPLY TOPICALLY TWICE DAILY  30 g  5  . HYDROcodone-acetaminophen (NORCO/VICODIN) 5-325 MG  per tablet Take 1 tablet by mouth every 12 (twelve) hours as needed.  60 tablet  0  . ipratropium (ATROVENT) 0.03 % nasal spray Place 2 sprays into the nose every 12 (twelve) hours.  30 mL  3  . metFORMIN (GLUCOPHAGE) 500 MG tablet Take 1 tablet (500 mg total) by mouth 3 (three) times daily with meals.  270 tablet  1  . metoprolol succinate (TOPROL-XL) 50 MG 24 hr tablet Take 1 tablet (50 mg total) by mouth daily. Take with or immediately following a meal.  90 tablet  1  . nitroGLYCERIN (NITROSTAT) 0.4 MG SL tablet Place 1 tablet (0.4 mg total) under the tongue every 5 (five) minutes as needed.  50 tablet  11  . POTASSIUM CHLORIDE CR PO Take by mouth daily.        . ramipril (ALTACE) 5 MG capsule TAKE 2 CAPSULES BY MOUTH EVERY DAY  180 capsule  1  . ranitidine (ZANTAC) 75 MG tablet Take 75 mg by mouth at bedtime.        . sulindac (CLINORIL) 200 MG tablet Take 0.5 tablets (100 mg total) by mouth daily as needed.  45 tablet  1  . tamsulosin (FLOMAX) 0.4 MG CAPS capsule Take 1 capsule (0.4 mg total) by mouth daily.  90 capsule  1   No current facility-administered medications on  file prior to visit.    BP 120/76  Pulse 85  Temp(Src) 98.2 F (36.8 C) (Oral)  Wt 154 lb (69.854 kg)  SpO2 97%    Objective:   Physical Exam  Constitutional: He is oriented to person, place, and time. He appears well-developed and well-nourished.  HENT:  Head: Normocephalic and atraumatic.  Slightly retracted right tympanic membrane. Mild oropharyngeal erythema.  Neck: Neck supple.  Cardiovascular: Normal rate, regular rhythm and normal heart sounds.   No murmur heard. Pulmonary/Chest: Effort normal and breath sounds normal. He has no wheezes.  Lymphadenopathy:    He has no cervical adenopathy.  Neurological: He is alert and oriented to person, place, and time. No cranial nerve deficit.          Assessment & Plan:

## 2013-08-16 NOTE — Assessment & Plan Note (Signed)
Patient complains of cough with greenish sputum and yellowish nasal discharge. His lungs are clear on exam. He has had issues with recurrent sinusitis in the past. Treat with cefuroxime 500 mg twice daily for 10 days.  Use intranasal saline.  Patient advised to call office if symptoms persist or worsen.

## 2013-08-16 NOTE — Progress Notes (Signed)
Pre visit review using our clinic review tool, if applicable. No additional management support is needed unless otherwise documented below in the visit note. 

## 2013-08-31 ENCOUNTER — Other Ambulatory Visit (INDEPENDENT_AMBULATORY_CARE_PROVIDER_SITE_OTHER): Payer: PRIVATE HEALTH INSURANCE

## 2013-08-31 DIAGNOSIS — E1310 Other specified diabetes mellitus with ketoacidosis without coma: Secondary | ICD-10-CM

## 2013-08-31 DIAGNOSIS — E1110 Type 2 diabetes mellitus with ketoacidosis without coma: Secondary | ICD-10-CM

## 2013-08-31 DIAGNOSIS — I10 Essential (primary) hypertension: Secondary | ICD-10-CM

## 2013-08-31 DIAGNOSIS — E785 Hyperlipidemia, unspecified: Secondary | ICD-10-CM

## 2013-08-31 LAB — LIPID PANEL
Cholesterol: 107 mg/dL (ref 0–200)
HDL: 40.3 mg/dL (ref >39.00)
LDL Cholesterol: 52 mg/dL (ref 0–99)
NonHDL: 66.7
Total CHOL/HDL Ratio: 3
Triglycerides: 72 mg/dL (ref 0.0–149.0)
VLDL: 14.4 mg/dL (ref 0.0–40.0)

## 2013-08-31 LAB — BASIC METABOLIC PANEL
BUN: 12 mg/dL (ref 6–23)
CALCIUM: 9.3 mg/dL (ref 8.4–10.5)
CO2: 21 mEq/L (ref 19–32)
CREATININE: 0.8 mg/dL (ref 0.4–1.5)
Chloride: 109 mEq/L (ref 96–112)
GFR: 104.57 mL/min (ref >60.00)
GLUCOSE: 131 mg/dL — AB (ref 70–99)
Potassium: 3.8 mEq/L (ref 3.5–5.1)
SODIUM: 139 meq/L (ref 135–145)

## 2013-08-31 LAB — HEPATIC FUNCTION PANEL
ALT: 37 U/L (ref 0–53)
AST: 30 U/L (ref 0–37)
Albumin: 4.3 g/dL (ref 3.5–5.2)
Alkaline Phosphatase: 59 U/L (ref 39–117)
Bilirubin, Direct: 0.2 mg/dL (ref 0.0–0.3)
Total Bilirubin: 1.2 mg/dL (ref 0.2–1.2)
Total Protein: 7.2 g/dL (ref 6.0–8.3)

## 2013-08-31 LAB — HEMOGLOBIN A1C: Hgb A1c MFr Bld: 6.6 % — ABNORMAL HIGH (ref 4.6–6.5)

## 2013-09-07 ENCOUNTER — Encounter: Payer: Self-pay | Admitting: Internal Medicine

## 2013-09-07 ENCOUNTER — Ambulatory Visit (INDEPENDENT_AMBULATORY_CARE_PROVIDER_SITE_OTHER): Payer: PRIVATE HEALTH INSURANCE | Admitting: Internal Medicine

## 2013-09-07 VITALS — BP 122/66 | HR 72 | Temp 98.1°F | Ht 65.0 in | Wt 156.0 lb

## 2013-09-07 DIAGNOSIS — I10 Essential (primary) hypertension: Secondary | ICD-10-CM

## 2013-09-07 DIAGNOSIS — M159 Polyosteoarthritis, unspecified: Secondary | ICD-10-CM

## 2013-09-07 DIAGNOSIS — M8949 Other hypertrophic osteoarthropathy, multiple sites: Secondary | ICD-10-CM

## 2013-09-07 DIAGNOSIS — E1149 Type 2 diabetes mellitus with other diabetic neurological complication: Secondary | ICD-10-CM

## 2013-09-07 DIAGNOSIS — M150 Primary generalized (osteo)arthritis: Secondary | ICD-10-CM

## 2013-09-07 MED ORDER — ATORVASTATIN CALCIUM 20 MG PO TABS: ORAL_TABLET | ORAL | Status: DC

## 2013-09-07 MED ORDER — HYDROCODONE-ACETAMINOPHEN 5-325 MG PO TABS: 1.0000 | ORAL_TABLET | Freq: Two times a day (BID) | ORAL | Status: DC | PRN

## 2013-09-07 NOTE — Assessment & Plan Note (Signed)
Well controlled.  BP: 122/66 mmHg  Lab Results  Component Value Date   CREATININE 0.8 08/31/2013

## 2013-09-07 NOTE — Progress Notes (Signed)
Pre visit review using our clinic review tool, if applicable. No additional management support is needed unless otherwise documented below in the visit note. 

## 2013-09-07 NOTE — Assessment & Plan Note (Signed)
Stable.  No change in regimen.  Lab Results  Component Value Date   HGBA1C 6.6* 08/31/2013

## 2013-09-07 NOTE — Assessment & Plan Note (Signed)
Unchanged. Patient using combination of clinoril and vicodin.

## 2013-09-07 NOTE — Patient Instructions (Signed)
Schedule next visit as medicare physical. Please complete the following lab tests before your next follow up appointment: BMET, A1c - 250.00

## 2013-09-07 NOTE — Progress Notes (Signed)
Subjective:    Patient ID: Kevin Mcclain, male    DOB: [birth year], 68 y.o.   MRN: [id number]  HPI  68 year old white male with history of coronary artery disease, type 2 diabetes and hypertension for routine followup. Patient denies any significant interval medical history. Patient's blood sugars are stable.  Hypertension-no dizziness or lightheadedness.  Hyperlipidemia - stable.  Lab tests reviewed.  Chronic osteoarthritis - patient using combination of pleural and hydrocodone.  Review of Systems Negative for chest pain, negative for weight change    Past Medical History  Diagnosis Date  . Depression   . Arthritis   . Diabetes mellitus type II   . Kidney stones   . CAD (coronary artery disease)     severe CAD-s/p MI 2000 with PTCA, s/p CABG 2005-by Dr Vanetta Mulders at [place] EF 50-55%  . Hypertension   . Hyperlipidemia   . Gout   . Vertigo     History   Social History  . Marital Status: Married    Spouse Name: N/A    Number of Children: N/A  . Years of Education: N/A   Occupational History  . Not on file.   Social History Main Topics  . Smoking status: Never Smoker   . Smokeless tobacco: Not on file  . Alcohol Use: No  . Drug Use: No  . Sexual Activity: Not on file   Other Topics Concern  . 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 Coronary artery bypass graft  2005    5 grafts  . Cholecystectomy      Family History  Problem Relation Age of Onset  . Heart attack Mother     deceased age 40 secondary  . Heart disease Mother   . Colon cancer Father   . Stroke Father     deceased age 76  . Heart disease Father   . Cancer Father     colon  . Stroke Sister 43  . Heart disease Sister   . Coronary artery disease Brother     s/p bypass surgery  . Heart disease Brother    . Heart disease Brother     Allergies  Allergen Reactions  . Benzonatate   . Codeine   . Sitagliptin Phosphate     Current Outpatient Prescriptions on File Prior to Visit  Medication Sig Dispense Refill  . allopurinol (ZYLOPRIM) 300 MG tablet Take 1 tablet (300 mg total) by mouth daily.  90 tablet  1  . amLODipine (NORVASC) 10 MG tablet Take 1 tablet (10 mg total) by mouth daily.  90 tablet  1  . aspirin 325 MG tablet Take 325 mg by mouth daily.        Marland Kitchen atorvastatin (LIPITOR) 20 MG tablet TAKE 1 TABLET BY MOUTH ONCE DAILY  90 tablet  1  . colchicine 0.6 MG tablet Take 1 tablet (0.6 mg total) by mouth daily as needed.  90 tablet  1  . desoximetasone (TOPICORT) 0.25 % cream APPLY TOPICALLY TWICE DAILY  30 g  5  . HYDROcodone-acetaminophen (NORCO/VICODIN) 5-325 MG per tablet Take 1 tablet by mouth every 12 (twelve) hours as needed.  60 tablet  0  . ipratropium (ATROVENT) 0.03 %  nasal spray Place 2 sprays into the nose every 12 (twelve) hours.  30 mL  3  . metFORMIN (GLUCOPHAGE) 500 MG tablet Take 1 tablet (500 mg total) by mouth 3 (three) times daily with meals.  270 tablet  1  . metoprolol succinate (TOPROL-XL) 50 MG 24 hr tablet Take 1 tablet (50 mg total) by mouth daily. Take with or immediately following a meal.  90 tablet  1  . nitroGLYCERIN (NITROSTAT) 0.4 MG SL tablet Place 1 tablet (0.4 mg total) under the tongue every 5 (five) minutes as needed.  50 tablet  11  . POTASSIUM CHLORIDE CR PO Take by mouth daily.        . ramipril (ALTACE) 5 MG capsule TAKE 2 CAPSULES BY MOUTH EVERY DAY  180 capsule  1  . ranitidine (ZANTAC) 75 MG tablet Take 75 mg by mouth at bedtime.        . sulindac (CLINORIL) 200 MG tablet Take 0.5 tablets (100 mg total) by mouth daily as needed.  45 tablet  1  . tamsulosin (FLOMAX) 0.4 MG CAPS capsule Take 1 capsule (0.4 mg total) by mouth daily.  90 capsule  1   No current facility-administered medications on file prior to visit.    BP 122/66  Pulse 72   Temp(Src) 98.1 F (36.7 C) (Oral)  Ht 5\' 5"  (1.651 m)  Wt 156 lb (70.761 kg)  BMI 25.96 kg/m2    Objective:   Physical Exam  Constitutional: He is oriented to person, place, and time. He appears well-developed and well-nourished. No distress.  Cardiovascular: Normal rate, regular rhythm and normal heart sounds.   No murmur heard. Pulmonary/Chest: Effort normal. He has no wheezes.  Musculoskeletal: He exhibits no edema.  Neurological: He is alert and oriented to person, place, and time. No cranial nerve deficit.  Psychiatric: He has a normal mood and affect. His behavior is normal.          Assessment & Plan:

## 2013-09-08 ENCOUNTER — Ambulatory Visit: Payer: PRIVATE HEALTH INSURANCE | Admitting: Internal Medicine

## 2013-10-02 ENCOUNTER — Telehealth: Payer: Self-pay

## 2013-10-02 ENCOUNTER — Encounter: Payer: Self-pay | Admitting: Internal Medicine

## 2013-10-02 NOTE — Telephone Encounter (Signed)
Patient is scheduled for colon 11/13/13 2:00 pre-visit 9/3 1:30

## 2013-10-02 NOTE — Telephone Encounter (Signed)
Message copied by Marlon Pel on Mon Oct 02, 2013  3:57 PM ------      Message from: Silvano Rusk E      Created: Mon Oct 02, 2013  2:51 PM      Regarding: needs colonoscopy - screening       This is [name]'s stepfather            Linna Hoff has done an ERCP on him but he wanted a different person to do colonoscopy                  I have recommended Ulice Dash - please set up a previsit and colonoscopy date            You can just let me know what the dates are and I will tell him and can take info to him            Sept would be good       ------

## 2013-10-16 ENCOUNTER — Other Ambulatory Visit: Payer: Self-pay | Admitting: *Deleted

## 2013-10-16 DIAGNOSIS — Z1211 Encounter for screening for malignant neoplasm of colon: Secondary | ICD-10-CM

## 2013-10-16 MED ORDER — ATORVASTATIN CALCIUM 20 MG PO TABS: ORAL_TABLET | ORAL | Status: DC

## 2013-11-02 ENCOUNTER — Ambulatory Visit (AMBULATORY_SURGERY_CENTER): Payer: Self-pay

## 2013-11-02 VITALS — Ht 64.0 in | Wt 152.4 lb

## 2013-11-02 DIAGNOSIS — Z800 Family history of malignant neoplasm of digestive organs: Secondary | ICD-10-CM

## 2013-11-02 MED ORDER — MOVIPREP 100 G PO SOLR: 1.0000 | Freq: Once | ORAL | Status: DC

## 2013-11-02 NOTE — Progress Notes (Signed)
No allergies to eggs or soy No past problems with anesthesia No home oxygen No diet/weight loss meds  Has email  Emmi instructions given for colonoscopy 

## 2013-11-09 ENCOUNTER — Encounter: Payer: Self-pay | Admitting: Internal Medicine

## 2013-11-13 ENCOUNTER — Encounter: Payer: PRIVATE HEALTH INSURANCE | Admitting: Internal Medicine

## 2013-11-15 ENCOUNTER — Ambulatory Visit (AMBULATORY_SURGERY_CENTER): Payer: PRIVATE HEALTH INSURANCE | Admitting: Internal Medicine

## 2013-11-15 ENCOUNTER — Encounter: Payer: Self-pay | Admitting: Internal Medicine

## 2013-11-15 VITALS — BP 136/79 | HR 73 | Temp 97.7°F | Resp 17 | Ht 64.0 in | Wt 152.0 lb

## 2013-11-15 DIAGNOSIS — D126 Benign neoplasm of colon, unspecified: Secondary | ICD-10-CM

## 2013-11-15 DIAGNOSIS — D125 Benign neoplasm of sigmoid colon: Secondary | ICD-10-CM

## 2013-11-15 DIAGNOSIS — Z800 Family history of malignant neoplasm of digestive organs: Secondary | ICD-10-CM

## 2013-11-15 DIAGNOSIS — Z1211 Encounter for screening for malignant neoplasm of colon: Secondary | ICD-10-CM

## 2013-11-15 LAB — GLUCOSE, CAPILLARY
Glucose-Capillary: 145 mg/dL — ABNORMAL HIGH (ref 70–99)
Glucose-Capillary: 122 mg/dL — ABNORMAL HIGH (ref 70–99)

## 2013-11-15 MED ORDER — SODIUM CHLORIDE 0.9 % IV SOLN: 500.0000 mL | INTRAVENOUS | Status: DC

## 2013-11-15 NOTE — Progress Notes (Signed)
Procedure ends, to recovery, report given and VSS. 

## 2013-11-15 NOTE — Patient Instructions (Signed)
YOU HAD AN ENDOSCOPIC PROCEDURE TODAY AT THE [facility]: Refer to the procedure report that was given to you for any specific questions about what was found during the examination.  If the procedure report does not answer your questions, please call your gastroenterologist to clarify.  If you requested that your care partner not be given the details of your procedure findings, then the procedure report has been included in a sealed envelope for you to review at your convenience later.  YOU SHOULD EXPECT: Some feelings of bloating in the abdomen. Passage of more gas than usual.  Walking can help get rid of the air that was put into your GI tract during the procedure and reduce the bloating. If you had a lower endoscopy (such as a colonoscopy or flexible sigmoidoscopy) you may notice spotting of blood in your stool or on the toilet paper. If you underwent a bowel prep for your procedure, then you may not have a normal bowel movement for a few days.  DIET: Your first meal following the procedure should be a light meal and then it is ok to progress to your normal diet.  A half-sandwich or bowl of soup is an example of a good first meal.  Heavy or fried foods are harder to digest and may make you feel nauseous or bloated.  Likewise meals heavy in dairy and vegetables can cause extra gas to form and this can also increase the bloating.  Drink plenty of fluids but you should avoid alcoholic beverages for 24 hours.  ACTIVITY: Your care partner should take you home directly after the procedure.  You should plan to take it easy, moving slowly for the rest of the day.  You can resume normal activity the day after the procedure however you should NOT DRIVE or use heavy machinery for 24 hours (because of the sedation medicines used during the test).    SYMPTOMS TO REPORT IMMEDIATELY: A gastroenterologist can be reached at any hour.  During normal business hours, 8:30 AM to 5:00 PM Monday through Friday,  call [phone].  After hours and on weekends, please call the GI answering service at [phone] who will take a message and have the physician on call contact you.   Following lower endoscopy (colonoscopy or flexible sigmoidoscopy):  Excessive amounts of blood in the stool  Significant tenderness or worsening of abdominal pains  Swelling of the abdomen that is new, acute  Fever of 100F or higher    FOLLOW UP: If any biopsies were taken you will be contacted by phone or by letter within the next 1-3 weeks.  Call your gastroenterologist if you have not heard about the biopsies in 3 weeks.  Our staff will call the home number listed on your records the next business day following your procedure to check on you and address any questions or concerns that you may have at that time regarding the information given to you following your procedure. This is a courtesy call and so if there is no answer at the home number and we have not heard from you through the emergency physician on call, we will assume that you have returned to your regular daily activities without incident.  SIGNATURES/CONFIDENTIALITY: You and/or your care partner have signed paperwork which will be entered into your electronic medical record.  These signatures attest to the fact that that the information above on your After Visit Summary has been reviewed and is understood.  Full responsibility of the confidentiality   of this discharge information lies with you and/or your care-partner  Information on polyps,diverticulosis, hemorrhoids, and high fiber diet given to you today   

## 2013-11-15 NOTE — Progress Notes (Signed)
Called to room to assist during endoscopic procedure.  Patient ID and intended procedure confirmed with present staff. Received instructions for my participation in the procedure from the performing physician.  

## 2013-11-15 NOTE — Op Note (Signed)
[place]  [address] [place], [address]   COLONOSCOPY PROCEDURE REPORT  PATIENT: Kevin Mcclain, Kevin Mcclain  MR#: [id number] BIRTHDATE: 03-10-1945 , 38  yrs. old GENDER: Male ENDOSCOPIST: Jerene Bears, MD REFERRED [id number] Shawna Orleans, DO PROCEDURE DATE:  11/15/2013 PROCEDURE:   Colonoscopy with cold biopsy polypectomy First Screening Colonoscopy - Avg.  risk and is 50 yrs.  old or older - No.  Prior Negative Screening - Now for repeat screening. N/A  History of Adenoma - Now for follow-up colonoscopy & has been > or = to 3 yrs.  N/A  Polyps Removed Today? Yes. ASA CLASS:   Class III INDICATIONS:elevated risk screening and Patient's immediate family history of colon cancer. MEDICATIONS: MAC sedation, administered by CRNA and propofol (Diprivan) 250mg  IV  DESCRIPTION OF PROCEDURE:   After the risks benefits and alternatives of the procedure were thoroughly explained, informed consent was obtained.  A digital rectal exam revealed several skin tags small external hemorrhoids.   The [id number] endoscope was introduced through the anus and advanced to the terminal ileum which was intubated for a short distance. No adverse events experienced.   The quality of the prep was good, using MoviPrep  The instrument was then slowly withdrawn as the colon was fully examined.   COLON FINDINGS: The mucosa appeared normal in the terminal ileum. A sessile polyp measuring 4 mm in size was found in the sigmoid colon.  A polypectomy was performed with cold forceps.  The resection was complete and the polyp tissue was completely retrieved.   Mild diverticulosis was noted in the ascending colon and sigmoid colon.  Retroflexed views revealed small internal/external hemorrhoids and 2 skin tags in the anal canal. The time to cecum=3 minutes 17 seconds.  Withdrawal time=11 minutes 40 seconds.  The scope was withdrawn and the procedure completed. COMPLICATIONS: There were no  complications.  ENDOSCOPIC IMPRESSION: 1.   Normal mucosa in the terminal ileum 2.   Sessile polyp measuring 4 mm in size was found in the sigmoid colon; polypectomy was performed with cold forceps 3.   Mild diverticulosis was noted in the ascending colon and sigmoid colon  RECOMMENDATIONS: 1.  Await pathology results 2.  High fiber diet 3.  Repeat Colonoscopy in 5 years. 4.  You will receive a letter within 1-2 weeks with the results of your biopsy as well as final recommendations.  Please call my office if you have not received a letter after 3 weeks.   eSigned:  Jerene Bears, MD 11/15/2013 10:01 AM      cc: The Patient and Drema Pry, DO

## 2013-11-16 ENCOUNTER — Telehealth: Payer: Self-pay

## 2013-11-16 NOTE — Telephone Encounter (Signed)
Left a message at # [phone] for the pt to call us back if he has any questions or concerns. maw

## 2013-11-21 ENCOUNTER — Encounter: Payer: Self-pay | Admitting: Internal Medicine

## 2013-12-04 ENCOUNTER — Telehealth: Payer: Self-pay | Admitting: *Deleted

## 2013-12-04 ENCOUNTER — Other Ambulatory Visit (INDEPENDENT_AMBULATORY_CARE_PROVIDER_SITE_OTHER): Payer: PRIVATE HEALTH INSURANCE

## 2013-12-04 ENCOUNTER — Encounter: Payer: PRIVATE HEALTH INSURANCE | Admitting: Internal Medicine

## 2013-12-04 DIAGNOSIS — Z0000 Encounter for general adult medical examination without abnormal findings: Secondary | ICD-10-CM

## 2013-12-04 LAB — CBC WITH DIFFERENTIAL/PLATELET
Basophils Absolute: 0.1 10*3/uL (ref 0.0–0.1)
Basophils Relative: 0.8 % (ref 0.0–3.0)
EOS ABS: 0.3 10*3/uL (ref 0.0–0.7)
EOS PCT: 2.7 % (ref 0.0–5.0)
HCT: 46.9 % (ref 39.0–52.0)
Hemoglobin: 15.5 g/dL (ref 13.0–17.0)
LYMPHS ABS: 3.8 10*3/uL (ref 0.7–4.0)
Lymphocytes Relative: 37.3 % (ref 12.0–46.0)
MCHC: 33 g/dL (ref 30.0–36.0)
MCV: 92.8 fl (ref 78.0–100.0)
Monocytes Absolute: 0.9 10*3/uL (ref 0.1–1.0)
Monocytes Relative: 8.7 % (ref 3.0–12.0)
NEUTROS PCT: 50.5 % (ref 43.0–77.0)
Neutro Abs: 5.1 10*3/uL (ref 1.4–7.7)
PLATELETS: 240 10*3/uL (ref 150.0–400.0)
RBC: 5.05 Mil/uL (ref 4.22–5.81)
RDW: 14.7 % (ref 11.5–15.5)
WBC: 10.1 10*3/uL (ref 4.0–10.5)

## 2013-12-04 LAB — HEMOGLOBIN A1C: Hgb A1c MFr Bld: 6.2 % (ref 4.6–6.5)

## 2013-12-04 LAB — HEPATIC FUNCTION PANEL
ALT: 28 U/L (ref 0–53)
AST: 25 U/L (ref 0–37)
Albumin: 4.5 g/dL (ref 3.5–5.2)
Alkaline Phosphatase: 64 U/L (ref 39–117)
BILIRUBIN TOTAL: 1 mg/dL (ref 0.2–1.2)
Bilirubin, Direct: 0.2 mg/dL (ref 0.0–0.3)
Total Protein: 7.3 g/dL (ref 6.0–8.3)

## 2013-12-04 LAB — BASIC METABOLIC PANEL
BUN: 16 mg/dL (ref 6–23)
CO2: 25 meq/L (ref 19–32)
CREATININE: 0.9 mg/dL (ref 0.4–1.5)
Calcium: 9.7 mg/dL (ref 8.4–10.5)
Chloride: 105 mEq/L (ref 96–112)
GFR: 93.35 mL/min (ref >60.00)
GLUCOSE: 124 mg/dL — AB (ref 70–99)
Potassium: 4.8 mEq/L (ref 3.5–5.1)
Sodium: 140 mEq/L (ref 135–145)

## 2013-12-04 LAB — LIPID PANEL
CHOL/HDL RATIO: 3
Cholesterol: 117 mg/dL (ref 0–200)
HDL: 37.4 mg/dL — AB (ref >39.00)
LDL CALC: 53 mg/dL (ref 0–99)
NonHDL: 79.6
TRIGLYCERIDES: 132 mg/dL (ref 0.0–149.0)
VLDL: 26.4 mg/dL (ref 0.0–40.0)

## 2013-12-04 LAB — PSA: PSA: 1.94 ng/mL (ref 0.10–4.00)

## 2013-12-04 LAB — TSH: TSH: 2.65 u[IU]/mL (ref 0.35–4.50)

## 2013-12-04 MED ORDER — SULINDAC 200 MG PO TABS: 100.0000 mg | ORAL_TABLET | Freq: Every day | ORAL | Status: DC | PRN

## 2013-12-04 MED ORDER — HYDROCODONE-ACETAMINOPHEN 5-325 MG PO TABS: 1.0000 | ORAL_TABLET | Freq: Two times a day (BID) | ORAL | Status: DC | PRN

## 2013-12-04 NOTE — Telephone Encounter (Signed)
rx up front for p/u, pt aware

## 2013-12-04 NOTE — Addendum Note (Signed)
Addended by: Elmer Picker on: 12/04/2013 09:17 AM   Modules accepted: Orders

## 2013-12-04 NOTE — Telephone Encounter (Signed)
Ok to RF x 2 

## 2013-12-04 NOTE — Telephone Encounter (Signed)
Pt had to reschedule his cpx today.  He needs a refill on his hydrocodone 5/325.  Please let me know if ok and I can get another physician to sign.  Thanks

## 2013-12-06 ENCOUNTER — Ambulatory Visit (INDEPENDENT_AMBULATORY_CARE_PROVIDER_SITE_OTHER): Payer: PRIVATE HEALTH INSURANCE | Admitting: *Deleted

## 2013-12-06 DIAGNOSIS — Z23 Encounter for immunization: Secondary | ICD-10-CM

## 2013-12-13 ENCOUNTER — Ambulatory Visit: Payer: PRIVATE HEALTH INSURANCE

## 2013-12-27 ENCOUNTER — Other Ambulatory Visit: Payer: Self-pay | Admitting: *Deleted

## 2013-12-27 MED ORDER — HYDROCODONE-ACETAMINOPHEN 5-325 MG PO TABS: 1.0000 | ORAL_TABLET | Freq: Two times a day (BID) | ORAL | Status: DC | PRN

## 2014-01-17 ENCOUNTER — Encounter: Payer: Self-pay | Admitting: Neurology

## 2014-01-19 ENCOUNTER — Ambulatory Visit (INDEPENDENT_AMBULATORY_CARE_PROVIDER_SITE_OTHER): Payer: PRIVATE HEALTH INSURANCE | Admitting: Internal Medicine

## 2014-01-19 ENCOUNTER — Encounter: Payer: Self-pay | Admitting: Internal Medicine

## 2014-01-19 VITALS — BP 128/68 | HR 72 | Temp 98.0°F | Ht 63.0 in | Wt 157.0 lb

## 2014-01-19 DIAGNOSIS — E119 Type 2 diabetes mellitus without complications: Secondary | ICD-10-CM

## 2014-01-19 DIAGNOSIS — M159 Polyosteoarthritis, unspecified: Secondary | ICD-10-CM

## 2014-01-19 DIAGNOSIS — Z0000 Encounter for general adult medical examination without abnormal findings: Secondary | ICD-10-CM | POA: Insufficient documentation

## 2014-01-19 DIAGNOSIS — Z125 Encounter for screening for malignant neoplasm of prostate: Secondary | ICD-10-CM

## 2014-01-19 LAB — MICROALBUMIN / CREATININE URINE RATIO
Creatinine,U: 181.6 mg/dL
Microalb Creat Ratio: 0.9 mg/g (ref 0.0–30.0)
Microalb, Ur: 1.7 mg/dL (ref 0.0–1.9)

## 2014-01-19 MED ORDER — HYDROCODONE-ACETAMINOPHEN 5-325 MG PO TABS: 1.0000 | ORAL_TABLET | Freq: Two times a day (BID) | ORAL | Status: DC | PRN

## 2014-01-19 NOTE — Progress Notes (Signed)
Pre visit review using our clinic review tool, if applicable. No additional management support is needed unless otherwise documented below in the visit note. 

## 2014-01-19 NOTE — Assessment & Plan Note (Addendum)
68 year old white male presents for Medicare loss exam. See attached form. Screening for substance abuse negative. He exercises on a regular basis.  Patient able to perform activities of daily living. No signs of gait instability or high risk for fall. Hearing is grossly normal. He denies hearing loss. He denies symptoms of depression. Screening questions for memory loss negative.  Patient up-to-date with adult vaccines. Patient advised to follow-up with his ophthalmologist for yearly eye exam.  Patient's PSA increase since prior testing. Digital rectal exam was normal.

## 2014-01-19 NOTE — Assessment & Plan Note (Signed)
Well controlled.  No change in therapy.  Lab Results  Component Value Date   HGBA1C 6.2 12/04/2013   HGBA1C 6.6* 08/31/2013   HGBA1C 5.8 03/03/2013   Lab Results  Component Value Date   MICROALBUR 1.7 01/19/2014   LDLCALC 53 12/04/2013   CREATININE 0.9 12/04/2013

## 2014-01-19 NOTE — Progress Notes (Signed)
Subjective:    Patient ID: Kevin Mcclain, male    DOB: 06/15/1945, 68 y.o.   MRN: [id number]  HPI  68 year old white male with history of coronary artery disease, type 2 diabetes and hypertension for Medicare wellness exam.  Medicare wellness questionnaire reviewed in detail. Patient has not had any hospitalizations within the last year. Physician roster reviewed. He reports he has made appointment for follow-up eye exam with his ophthalmologist. He has not had any surgeries within the last year. See attached Medicare wellness questionnaire for additional details/questions.  Type 2 diabetes-patient reports his blood sugars are well-controlled. His A1c has improved.  He has history of chronic bilateral hand pain secondary to osteoarthritis. He uses combination of sulindac and generic Vicodin as needed.  Patient had normal surveillance colonoscopy in September 2015.  Review of Systems   Constitutional: Negative for activity change, appetite change and unexpected weight change.  Eyes: Negative for visual disturbance.  Respiratory: Negative for cough, chest tightness and shortness of breath.   Cardiovascular: Negative for chest pain.  Genitourinary: Negative for difficulty urinating.  Neurological: Negative for headaches.  Gastrointestinal: Negative for abdominal pain, heartburn melena or hematochezia Psych: Negative for depression or anxiety Endo:  No polyuria or polydypsia        Past Medical History  Diagnosis Date  . Depression   . Arthritis   . Diabetes mellitus type II   . Kidney stones   . CAD (coronary artery disease)     severe CAD-s/p MI 2000 with PTCA, s/p CABG 2005-by Dr Vanetta Mulders at [facility] EF 50-55%  . Hypertension   . Hyperlipidemia   . Gout   . Vertigo     History   Social History  . Marital Status: Married    Spouse Name: N/A    Number of Children: N/A  . Years of Education: N/A   Occupational History  . Not on file.   Social History Main  Topics  . Smoking status: Never Smoker   . Smokeless tobacco: Never Used  . Alcohol Use: No  . Drug Use: No  . Sexual Activity: Not on file   Other Topics Concern  . 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 Coronary artery bypass graft  2005    5 grafts  . Cholecystectomy      Family History  Problem Relation Age of Onset  . Heart attack Mother     deceased age 42 secondary  . Heart disease Mother   . Colon cancer Father   . Stroke Father     deceased age 74  . Heart disease Father   . Cancer Father     colon  . Stroke Sister 22  . Heart disease Sister   . Coronary artery disease Brother     s/p bypass surgery  . Heart disease Brother   . Heart disease Brother     Allergies  Allergen Reactions  . Benzonatate   . Codeine   . Sitagliptin Phosphate     Current Outpatient Prescriptions on File Prior to Visit  Medication Sig Dispense Refill  . allopurinol (ZYLOPRIM) 300 MG tablet Take 1 tablet (300 mg total) by mouth daily. 90 tablet 1  .  amLODipine (NORVASC) 10 MG tablet Take 1 tablet (10 mg total) by mouth daily. 90 tablet 1  . aspirin 325 MG tablet Take 325 mg by mouth daily.      Marland Kitchen atorvastatin (LIPITOR) 20 MG tablet TAKE 1 TABLET BY MOUTH ONCE DAILY 90 tablet 1  . colchicine 0.6 MG tablet Take 1 tablet (0.6 mg total) by mouth daily as needed. 90 tablet 1  . desoximetasone (TOPICORT) 0.25 % cream APPLY TOPICALLY TWICE DAILY 30 g 5  . metFORMIN (GLUCOPHAGE) 500 MG tablet Take 1 tablet (500 mg total) by mouth 3 (three) times daily with meals. 270 tablet 1  . metoprolol succinate (TOPROL-XL) 50 MG 24 hr tablet Take 1 tablet (50 mg total) by mouth daily. Take with or immediately following a meal. 90 tablet 1  . nitroGLYCERIN (NITROSTAT) 0.4 MG SL tablet Place 1 tablet (0.4 mg total) under  the tongue every 5 (five) minutes as needed. 50 tablet 11  . POTASSIUM CHLORIDE CR PO Take by mouth daily.      . ramipril (ALTACE) 5 MG capsule TAKE 2 CAPSULES BY MOUTH EVERY DAY 180 capsule 1  . ranitidine (ZANTAC) 75 MG tablet Take 75 mg by mouth as needed.     . sulindac (CLINORIL) 200 MG tablet Take 0.5 tablets (100 mg total) by mouth daily as needed. 45 tablet 1  . tamsulosin (FLOMAX) 0.4 MG CAPS capsule Take 0.4 mg by mouth as needed.     No current facility-administered medications on file prior to visit.    BP 128/68 mmHg  Pulse 72  Temp(Src) 98 F (36.7 C) (Oral)  Ht 5\' 3"  (1.6 m)  Wt 157 lb (71.215 kg)  BMI 27.82 kg/m2    Objective:   Physical Exam  Constitutional: He is oriented to person, place, and time. He appears well-developed and well-nourished. No distress.  HENT:  Head: Normocephalic and atraumatic.  Right Ear: External ear normal.  Left Ear: External ear normal.  Mouth/Throat: Oropharynx is clear and moist.  Hearing is grossly normal  Eyes: EOM are normal. Pupils are equal, round, and reactive to light.  Neck: Neck supple.  No carotid bruit  Cardiovascular: Normal rate, regular rhythm and normal heart sounds.   No murmur heard. Pulmonary/Chest: Effort normal and breath sounds normal. He has no wheezes.  Abdominal: Soft. Bowel sounds are normal. He exhibits no mass. There is no tenderness.  Genitourinary: Prostate normal. Guaiac negative stool.  External hemorrhoids  Musculoskeletal: He exhibits no edema.  Lymphadenopathy:    He has no cervical adenopathy.  Neurological: He is alert and oriented to person, place, and time. No cranial nerve deficit.  Skin: Skin is warm and dry.  Psychiatric: He has a normal mood and affect. His behavior is normal.          Assessment & Plan:

## 2014-01-19 NOTE — Assessment & Plan Note (Signed)
Patient has chronic osteoarthritis of his hands. Hia symptoms controlled on combination of 10. His kidney function is normal. Continue to monitor renal function. Refill for hydrocodone provided. Lab Results  Component Value Date   NA 140 12/04/2013   K 4.8 12/04/2013   CL 105 12/04/2013   CO2 25 12/04/2013   Lab Results  Component Value Date   CREATININE 0.9 12/04/2013

## 2014-01-19 NOTE — Patient Instructions (Signed)
Please complete the following lab tests before your next follow up appointment: BMET, A1c - 250.00 

## 2014-01-23 ENCOUNTER — Encounter: Payer: Self-pay | Admitting: Neurology

## 2014-04-18 ENCOUNTER — Other Ambulatory Visit: Payer: Self-pay | Admitting: *Deleted

## 2014-04-18 MED ORDER — ATORVASTATIN CALCIUM 20 MG PO TABS: ORAL_TABLET | ORAL | Status: DC

## 2014-04-18 MED ORDER — METFORMIN HCL 500 MG PO TABS: 500.0000 mg | ORAL_TABLET | Freq: Three times a day (TID) | ORAL | Status: DC

## 2014-04-18 MED ORDER — METOPROLOL SUCCINATE ER 50 MG PO TB24
50.0000 mg | ORAL_TABLET | Freq: Every day | ORAL | Status: DC
Start: 2014-04-18 — End: 2014-04-28

## 2014-04-18 MED ORDER — NITROGLYCERIN 0.4 MG SL SUBL: 0.4000 mg | SUBLINGUAL_TABLET | SUBLINGUAL | Status: DC | PRN

## 2014-04-18 MED ORDER — ALLOPURINOL 300 MG PO TABS
300.0000 mg | ORAL_TABLET | Freq: Every day | ORAL | Status: DC
Start: 2014-04-18 — End: 2014-05-19

## 2014-04-18 MED ORDER — AMLODIPINE BESYLATE 10 MG PO TABS
10.0000 mg | ORAL_TABLET | Freq: Every day | ORAL | Status: DC
Start: 2014-04-18 — End: 2014-05-19

## 2014-04-18 MED ORDER — DESOXIMETASONE 0.25 % EX CREA: TOPICAL_CREAM | CUTANEOUS | Status: DC

## 2014-04-18 MED ORDER — RAMIPRIL 5 MG PO CAPS: ORAL_CAPSULE | ORAL | Status: DC

## 2014-04-18 MED ORDER — HYDROCODONE-ACETAMINOPHEN 5-325 MG PO TABS: 1.0000 | ORAL_TABLET | Freq: Two times a day (BID) | ORAL | Status: DC | PRN

## 2014-04-28 ENCOUNTER — Other Ambulatory Visit: Payer: Self-pay | Admitting: Internal Medicine

## 2014-05-19 ENCOUNTER — Other Ambulatory Visit: Payer: Self-pay | Admitting: Internal Medicine

## 2014-05-21 ENCOUNTER — Other Ambulatory Visit: Payer: Self-pay | Admitting: *Deleted

## 2014-05-21 MED ORDER — HYDROCODONE-ACETAMINOPHEN 5-325 MG PO TABS: 1.0000 | ORAL_TABLET | Freq: Two times a day (BID) | ORAL | Status: DC | PRN

## 2014-06-15 ENCOUNTER — Telehealth: Payer: Self-pay | Admitting: Internal Medicine

## 2014-06-15 MED ORDER — PREDNISONE 20 MG PO TABS: ORAL_TABLET | ORAL | Status: DC

## 2014-06-15 MED ORDER — CEFUROXIME AXETIL 500 MG PO TABS: 500.0000 mg | ORAL_TABLET | Freq: Two times a day (BID) | ORAL | Status: AC

## 2014-06-15 NOTE — Telephone Encounter (Signed)
Patient accompanied his wife today who was seen for bronchitis.  He is having similar symptoms for 1-2 weeks.  He complains of cough and yellow green sputum. (foul smelling).  He is prone to sinus infections. Mild exp wheeze on lung exam.  Plan:  Treat with cefuroxime 500 mg twice daily for 10 days along with prednisone taper.  Reassess in 2 wks

## 2014-07-10 ENCOUNTER — Telehealth: Payer: Self-pay | Admitting: Internal Medicine

## 2014-07-10 MED ORDER — HYDROCODONE-ACETAMINOPHEN 5-325 MG PO TABS
1.0000 | ORAL_TABLET | Freq: Two times a day (BID) | ORAL | Status: DC | PRN
Start: 2014-07-10 — End: 2014-08-21

## 2014-07-10 NOTE — Telephone Encounter (Signed)
rx up front for p/u, pt aware

## 2014-07-10 NOTE — Telephone Encounter (Signed)
Pt needs new rx hydrocodone °

## 2014-07-11 ENCOUNTER — Ambulatory Visit: Payer: PRIVATE HEALTH INSURANCE | Admitting: Internal Medicine

## 2014-07-20 ENCOUNTER — Ambulatory Visit: Payer: Medicare Other | Admitting: Internal Medicine

## 2014-08-20 ENCOUNTER — Telehealth: Payer: Self-pay | Admitting: Internal Medicine

## 2014-08-20 NOTE — Telephone Encounter (Signed)
Could you sign for this?

## 2014-08-20 NOTE — Telephone Encounter (Signed)
ok 

## 2014-08-20 NOTE — Telephone Encounter (Signed)
Pt needs new hydrocodone °

## 2014-08-21 MED ORDER — HYDROCODONE-ACETAMINOPHEN 5-325 MG PO TABS
1.0000 | ORAL_TABLET | Freq: Two times a day (BID) | ORAL | Status: DC | PRN
Start: 2014-08-21 — End: 2014-09-26

## 2014-08-21 NOTE — Telephone Encounter (Signed)
rx up front for p/u, pt aware

## 2014-08-22 ENCOUNTER — Ambulatory Visit: Payer: Medicare Other | Admitting: Internal Medicine

## 2014-09-14 ENCOUNTER — Other Ambulatory Visit: Payer: Self-pay | Admitting: Internal Medicine

## 2014-09-26 ENCOUNTER — Other Ambulatory Visit: Payer: Self-pay | Admitting: Family Medicine

## 2014-09-26 ENCOUNTER — Telehealth: Payer: Self-pay | Admitting: Internal Medicine

## 2014-09-26 DIAGNOSIS — M159 Polyosteoarthritis, unspecified: Secondary | ICD-10-CM

## 2014-09-26 DIAGNOSIS — M8949 Other hypertrophic osteoarthropathy, multiple sites: Secondary | ICD-10-CM

## 2014-09-26 DIAGNOSIS — M25512 Pain in left shoulder: Secondary | ICD-10-CM

## 2014-09-26 DIAGNOSIS — M150 Primary generalized (osteo)arthritis: Secondary | ICD-10-CM

## 2014-09-26 MED ORDER — HYDROCODONE-ACETAMINOPHEN 5-325 MG PO TABS: 1.0000 | ORAL_TABLET | Freq: Two times a day (BID) | ORAL | Status: DC | PRN

## 2014-09-26 NOTE — Telephone Encounter (Signed)
Patient notified medication is up front for pick up.

## 2014-09-26 NOTE — Telephone Encounter (Signed)
Pt request refill HYDROcodone-acetaminophen (NORCO/VICODIN) 5-325 MG per tablet Pt is out of meds and has appt 8/10, but needs med now.

## 2014-10-10 ENCOUNTER — Ambulatory Visit (INDEPENDENT_AMBULATORY_CARE_PROVIDER_SITE_OTHER): Payer: Medicare Other | Admitting: Internal Medicine

## 2014-10-10 ENCOUNTER — Other Ambulatory Visit: Payer: Self-pay | Admitting: Internal Medicine

## 2014-10-10 ENCOUNTER — Encounter: Payer: Self-pay | Admitting: Internal Medicine

## 2014-10-10 VITALS — BP 110/68 | HR 68 | Temp 97.9°F | Ht 63.0 in | Wt 152.6 lb

## 2014-10-10 DIAGNOSIS — Z23 Encounter for immunization: Secondary | ICD-10-CM | POA: Diagnosis not present

## 2014-10-10 DIAGNOSIS — R0989 Other specified symptoms and signs involving the circulatory and respiratory systems: Secondary | ICD-10-CM

## 2014-10-10 DIAGNOSIS — M1000 Idiopathic gout, unspecified site: Secondary | ICD-10-CM | POA: Diagnosis not present

## 2014-10-10 DIAGNOSIS — M159 Polyosteoarthritis, unspecified: Secondary | ICD-10-CM

## 2014-10-10 DIAGNOSIS — M25512 Pain in left shoulder: Secondary | ICD-10-CM

## 2014-10-10 DIAGNOSIS — M150 Primary generalized (osteo)arthritis: Secondary | ICD-10-CM

## 2014-10-10 DIAGNOSIS — E119 Type 2 diabetes mellitus without complications: Secondary | ICD-10-CM

## 2014-10-10 DIAGNOSIS — M8949 Other hypertrophic osteoarthropathy, multiple sites: Secondary | ICD-10-CM

## 2014-10-10 DIAGNOSIS — I10 Essential (primary) hypertension: Secondary | ICD-10-CM | POA: Diagnosis not present

## 2014-10-10 DIAGNOSIS — R202 Paresthesia of skin: Secondary | ICD-10-CM

## 2014-10-10 LAB — BASIC METABOLIC PANEL
BUN: 14 mg/dL (ref 6–23)
CHLORIDE: 102 meq/L (ref 96–112)
CO2: 28 mEq/L (ref 19–32)
CREATININE: 0.87 mg/dL (ref 0.40–1.50)
Calcium: 9.9 mg/dL (ref 8.4–10.5)
GFR: 91.89 mL/min (ref >60.00)
Glucose, Bld: 106 mg/dL — ABNORMAL HIGH (ref 70–99)
Potassium: 4.8 mEq/L (ref 3.5–5.1)
Sodium: 138 mEq/L (ref 135–145)

## 2014-10-10 LAB — MICROALBUMIN / CREATININE URINE RATIO
Creatinine,U: 176.3 mg/dL
Microalb Creat Ratio: 0.6 mg/g (ref 0.0–30.0)
Microalb, Ur: 1.1 mg/dL (ref 0.0–1.9)

## 2014-10-10 LAB — URIC ACID: URIC ACID, SERUM: 3.5 mg/dL — AB (ref 4.0–7.8)

## 2014-10-10 LAB — HEMOGLOBIN A1C: HEMOGLOBIN A1C: 6 % (ref 4.6–6.5)

## 2014-10-10 MED ORDER — HYDROCODONE-ACETAMINOPHEN 5-325 MG PO TABS: 1.0000 | ORAL_TABLET | Freq: Two times a day (BID) | ORAL | Status: DC | PRN

## 2014-10-10 MED ORDER — METOPROLOL SUCCINATE ER 50 MG PO TB24: ORAL_TABLET | ORAL | Status: DC

## 2014-10-10 MED ORDER — METFORMIN HCL 500 MG PO TABS: 500.0000 mg | ORAL_TABLET | Freq: Three times a day (TID) | ORAL | Status: DC

## 2014-10-10 MED ORDER — COLCHICINE 0.6 MG PO TABS: 0.6000 mg | ORAL_TABLET | Freq: Every day | ORAL | Status: DC | PRN

## 2014-10-10 MED ORDER — RAMIPRIL 5 MG PO CAPS: 10.0000 mg | ORAL_CAPSULE | Freq: Every day | ORAL | Status: DC

## 2014-10-10 MED ORDER — AMLODIPINE BESYLATE 10 MG PO TABS: 10.0000 mg | ORAL_TABLET | Freq: Every day | ORAL | Status: DC

## 2014-10-10 MED ORDER — ALLOPURINOL 300 MG PO TABS: 300.0000 mg | ORAL_TABLET | Freq: Every day | ORAL | Status: DC

## 2014-10-10 NOTE — Progress Notes (Signed)
Subjective:    Patient ID: Kevin Mcclain, male    DOB: 1946-01-16, 69 y.o.   MRN: [id number]  HPI  69 year old white male with history of type 2 diabetes, coronary artery disease and hypertension for which follow-up. Patient complains of foreign body sensation in his throat. His symptoms have been on and off for approximately 6 months. He has had issues with frequent sinus infections in the past. He has noticed change in voice. It only occurs after working outside.  Patient denies feeling neck mass or neck pain. No history of tobacco use  Patient also complains of chronic burning and tingling sensation in his hands. The symptoms slightly worse. He has also noticed similar symptoms in his toes. Patient has history of diabetes but his A1c has been well controlled last 10 years.  He uses vicodin for intermittent low back and to help with burning sensation in his hands.  Diabetes mellitus type 2-stable.  He has noticed more frequent urination at night.  Hypertension-stable  Gout - patient reports occasional pain in left great toe.  His symptoms usually improves with just 1 dose of colchicine.  Review of Systems Negative for neck pain, voice change after working outdoors.    Past Medical History  Diagnosis Date  . Depression   . Arthritis   . Diabetes mellitus type II   . Kidney stones   . CAD (coronary artery disease)     severe CAD-s/p MI 2000 with PTCA, s/p CABG 2005-by Dr Vanetta Mulders at [place] EF 50-55%  . Hypertension   . Hyperlipidemia   . Gout   . Vertigo     Social History   Social History  . Marital Status: Married    Spouse Name: N/A  . Number of Children: N/A  . Years of Education: N/A   Occupational History  . Not on file.   Social History Main Topics  . Smoking status: Never Smoker   . Smokeless tobacco: Never Used  . Alcohol Use: No  . Drug Use: No  . Sexual Activity: Not on file   Other Topics Concern  . Not on file   Social History Narrative     Occupation :  Retired Arboriculturist for [employer].     Married   Tobacco use - no   Alcohol use-no         Physician roster:   Cardiologist-Dr. Stanford Breed   Gastroenterologist-Dr. Hilarie Fredrickson    Past Surgical History  Procedure Laterality Date  . Coronary artery bypass graft  2005    5 grafts  . Cholecystectomy      Family History  Problem Relation Age of Onset  . Heart attack Mother     deceased age 101 secondary  . Heart disease Mother   . Colon cancer Father   . Stroke Father     deceased age 41  . Heart disease Father   . Cancer Father     colon  . Stroke Sister 6  . Heart disease Sister   . Coronary artery disease Brother     s/p bypass surgery  . Heart disease Brother   . Heart disease Brother     Allergies  Allergen Reactions  . Benzonatate   . Codeine   . Sitagliptin Phosphate     Current Outpatient Prescriptions on File Prior to Visit  Medication Sig Dispense Refill  . allopurinol (ZYLOPRIM) 300 MG tablet TAKE 1 TABLET BY MOUTH EVERY DAY 90 tablet 1  .  amLODipine (NORVASC) 10 MG tablet TAKE 1 TABLET BY MOUTH EVERY DAY 90 tablet 1  . aspirin 325 MG tablet Take 325 mg by mouth daily.      Marland Kitchen atorvastatin (LIPITOR) 20 MG tablet TAKE 1 TABLET BY MOUTH EVERY DAY 90 tablet 0  . colchicine 0.6 MG tablet Take 1 tablet (0.6 mg total) by mouth daily as needed. 90 tablet 1  . desoximetasone (TOPICORT) 0.25 % cream APPLY TOPICALLY TWICE DAILY 30 g 5  . HYDROcodone-acetaminophen (NORCO/VICODIN) 5-325 MG per tablet Take 1 tablet by mouth every 12 (twelve) hours as needed. 60 tablet 0  . metFORMIN (GLUCOPHAGE) 500 MG tablet TAKE 1 TABLET BY MOUTH 3 TIMES A DAY 270 tablet 1  . metoprolol succinate (TOPROL-XL) 50 MG 24 hr tablet TAKE 1 TABLET BY MOUTH DAILY. TAKE WITH OR IMMEDIATELY FOLLOWING A MEAL 90 tablet 1  . nitroGLYCERIN (NITROSTAT) 0.4 MG SL tablet Place 1 tablet (0.4 mg total) under the tongue every 5 (five) minutes as needed. 50 tablet 11  .  POTASSIUM CHLORIDE CR PO Take by mouth daily.      . predniSONE (DELTASONE) 20 MG tablet Take 1 tab twice daily for 4 days, then 1/2 tab twice daily for 4 days, then 1/2 tab once daily for 4 days 14 tablet 0  . ramipril (ALTACE) 5 MG capsule TAKE 2 CAPSULES BY MOUTH EVERY DAY 180 capsule 1  . ranitidine (ZANTAC) 75 MG tablet Take 75 mg by mouth as needed.     . sulindac (CLINORIL) 200 MG tablet Take 0.5 tablets (100 mg total) by mouth daily as needed. 45 tablet 1  . tamsulosin (FLOMAX) 0.4 MG CAPS capsule Take 0.4 mg by mouth as needed.     No current facility-administered medications on file prior to visit.    BP 110/68 mmHg  Pulse 68  Temp(Src) 97.9 F (36.6 C) (Oral)  Ht 5\' 3"  (1.6 m)  Wt 152 lb 9.6 oz (69.219 kg)  BMI 27.04 kg/m2    Objective:   Physical Exam  Constitutional: He is oriented to person, place, and time. He appears well-developed and well-nourished. No distress.  HENT:  Head: Normocephalic and atraumatic.  Right tonsillar mass (2-2.5cm)  Neck: Neck supple.  Cardiovascular: Normal rate, regular rhythm and normal heart sounds.   No murmur heard. Pulmonary/Chest: Effort normal and breath sounds normal. He has no wheezes.  Lymphadenopathy:    He has no cervical adenopathy.  Neurological: He is alert and oriented to person, place, and time. No cranial nerve deficit.  Skin: Skin is warm and dry.  Psychiatric: He has a normal mood and affect. His behavior is normal.        Assessment & Plan:   1. Foreign body sensation of throat / Possible tonsillar mass 2. Chronic paresthesias of fingertips 3. Diabetes mellitus type 2 controlled 4. Hypertension 5. Gout  Refer to ENT (Dr. Erik Obey) for further evaluation of foreign body sensation of throat (possible tonsillar mass on right side)  Arrange nerve conduction study.  I doubt patient's paresthesias of fingertips related to polyneuropathy of type 2 diabetes. We have previously checked his thyroid and B12 level  which have been normal.  Continue same dose of metformin. Monitor A1c and microalbumin / creatinine ratio.  Patient's blood pressures well controlled. Continue same dose of ramipril, metoprolol, and amlodipine.  Monitor BMET  Patient takes allopurinol 300 mg once daily. Check uric acid level. Continue to use colchicine as needed for infrequent gout flares.  Return office  visit in 2 months.

## 2014-10-10 NOTE — Progress Notes (Signed)
Pre visit review using our clinic review tool, if applicable. No additional management support is needed unless otherwise documented below in the visit note. 

## 2014-10-10 NOTE — Patient Instructions (Signed)
Our office will contact you regarding referral to ENT specialist

## 2014-10-12 ENCOUNTER — Other Ambulatory Visit: Payer: Self-pay | Admitting: Otolaryngology

## 2014-10-12 DIAGNOSIS — C801 Malignant (primary) neoplasm, unspecified: Secondary | ICD-10-CM

## 2014-10-12 HISTORY — DX: Malignant (primary) neoplasm, unspecified: C80.1

## 2014-10-18 ENCOUNTER — Other Ambulatory Visit (HOSPITAL_COMMUNITY): Payer: Self-pay | Admitting: Otolaryngology

## 2014-10-18 DIAGNOSIS — C099 Malignant neoplasm of tonsil, unspecified: Secondary | ICD-10-CM

## 2014-10-23 ENCOUNTER — Telehealth: Payer: Self-pay | Admitting: *Deleted

## 2014-10-23 NOTE — Telephone Encounter (Signed)
  Oncology Nurse Navigator Documentation Referral date to RadOnc/MedOnc: 10/19/14 (10/23/14 1723) Navigator Encounter Type: Introductory phone call (10/23/14 1723)    Placed introductory call to new referral patient.  Spoke with his wife. 1. Introduced myself as the oncology nurse navigator that works with Drs. Isidore Moos and Alvy Bimler to whom he has been referred by Dr. Erik Obey. 2. She confirmed understanding of referral and appt dates/times:  8/29 0700 PET, 8/31 0700 Dr. Isidore Moos, 12:00 Dr. Alvy Bimler. 3. I briefly explained my role as a navigator, indicated that I would be joining them during appts next week. 4. I answered her questions re RT and chemo, I explained purpose of dental evaluation referral. 5. I confirmed understanding of the [facility] location, explained arrival and registration process for appts. 6. I provided my contact information, encouraged them to call with questions/concerns before next week. 7. She verbalized understanding of information provided, expressed appreciation for my call.  Gayleen Orem, RN, BSN, [facility] [phone]

## 2014-10-24 ENCOUNTER — Other Ambulatory Visit: Payer: Self-pay | Admitting: Otolaryngology

## 2014-10-24 DIAGNOSIS — C099 Malignant neoplasm of tonsil, unspecified: Secondary | ICD-10-CM

## 2014-10-29 ENCOUNTER — Other Ambulatory Visit (HOSPITAL_COMMUNITY): Payer: Self-pay | Admitting: Otolaryngology

## 2014-10-29 ENCOUNTER — Encounter (HOSPITAL_COMMUNITY)
Admission: RE | Admit: 2014-10-29 | Discharge: 2014-10-29 | Disposition: A | Discharge status: Home / Self Care | Payer: Medicare Other | Source: Ambulatory Visit | Attending: Otolaryngology | Admitting: Otolaryngology

## 2014-10-29 DIAGNOSIS — C099 Malignant neoplasm of tonsil, unspecified: Secondary | ICD-10-CM | POA: Insufficient documentation

## 2014-10-29 LAB — GLUCOSE, CAPILLARY: GLUCOSE-CAPILLARY: 105 mg/dL — AB (ref 65–99)

## 2014-10-29 MED ORDER — FLUDEOXYGLUCOSE F - 18 (FDG) INJECTION
7.3000 | Freq: Once | INTRAVENOUS | Status: DC | PRN
  Administered 2014-10-29: 7.3 via INTRAVENOUS
  Filled 2014-10-29: qty 7.3

## 2014-10-30 ENCOUNTER — Inpatient Hospital Stay: Admission: RE | Admit: 2014-10-30 | Payer: Medicare Other | Source: Ambulatory Visit

## 2014-10-30 ENCOUNTER — Encounter (HOSPITAL_COMMUNITY): Payer: Medicare Other

## 2014-10-30 ENCOUNTER — Encounter: Payer: Self-pay | Admitting: Radiation Oncology

## 2014-10-30 NOTE — Progress Notes (Signed)
Head and Neck Cancer Location of Tumor / Histology: Right Tonsil mass  Patient presented  months ago with symptoms [id number] of foreign body in throat past on and off 6 months, noticed change in his voice after working outside, issues with frequent sinus infections in past Biopsies of  (if applicable) revealed: Diagnosis 10/12/2014:  Tonsil, biopsy, right- SQUAMOUS CELL CARCINOMA.  Nutrition Status Yes No Comments  Weight changes? []  [x]    Swallowing concerns? [x]  []  "feels like you have a bunch of food in your mouth and can't get it done."  Sometimes makes him cough, but states he hasn't choked  PEG? []  [x]     Referrals Yes No Comments  Social Work? []  []    Dentistry? [x]  []  Dr. Enrique Sack appt 11/01/14  Swallowing therapy? []  []    Nutrition? []  []    Med/Onc? [x]  []  Dr. Alvy Bimler- 10/31/14   Safety Issues Yes No Comments  Prior radiation? []  [x]    Pacemaker/ICD? []  [x]    Possible current pregnancy? []  [x]  N/A  Is the patient on methotrexate? []  [x]     Tobacco/Marijuana/Snuff/ETOH use: Never smoker,never used smokeless tobacco,no alcohol or drug use   Past/Anticipated interventions by otolaryngology, if any: Dr. Shepard [facility]- 11/22/28 ,Ct Scan of the Neck with Contrast 11/06/2014 @ 9:20 am With and Without Contrast, Pet Scan 10/29/14 Results in chart  Past/Anticipated interventions by medical oncology, if any: Dr. Alvy Bimler appt scheduled 10/31/14  Other:  Reports dry mouth, excess sputum.  Complaints of sour stomach, upset stomach and loose stools.       Current Complaints / other details:Married, HX CABG x5 2005, Depression,DM Type II CAD,Vertigo, Mother,MI, Heart disease,  Deceased aged 58 Father Colon cancer,Stroke,deceased age 55,Sister Stroke,Heart disease,Brothr heart disease,CAD,s/p by pass surgery Pet Scan ordered ,Ct Scan of the Neck with Contrast 11/06/2014 @ 9:20 am with a  Allergies: Benzonatate,Codeine,Sitagliptin Phosphate

## 2014-10-31 ENCOUNTER — Encounter: Payer: Self-pay | Admitting: *Deleted

## 2014-10-31 ENCOUNTER — Ambulatory Visit
Admission: RE | Admit: 2014-10-31 | Discharge: 2014-10-31 | Disposition: A | Discharge status: Home / Self Care | Payer: Medicare Other | Source: Ambulatory Visit | Attending: Radiation Oncology | Admitting: Radiation Oncology

## 2014-10-31 ENCOUNTER — Ambulatory Visit (HOSPITAL_BASED_OUTPATIENT_CLINIC_OR_DEPARTMENT_OTHER): Payer: Medicare Other | Admitting: Hematology and Oncology

## 2014-10-31 ENCOUNTER — Encounter: Payer: Self-pay | Admitting: Radiation Oncology

## 2014-10-31 ENCOUNTER — Encounter: Payer: Self-pay | Admitting: Hematology and Oncology

## 2014-10-31 VITALS — BP 123/55 | HR 71 | Temp 98.0°F | Resp 18 | Ht 63.0 in | Wt 155.3 lb

## 2014-10-31 VITALS — BP 138/66 | HR 65 | Temp 98.3°F | Resp 12 | Wt 154.6 lb

## 2014-10-31 DIAGNOSIS — Z823 Family history of stroke: Secondary | ICD-10-CM | POA: Diagnosis not present

## 2014-10-31 DIAGNOSIS — E785 Hyperlipidemia, unspecified: Secondary | ICD-10-CM | POA: Diagnosis not present

## 2014-10-31 DIAGNOSIS — Z8249 Family history of ischemic heart disease and other diseases of the circulatory system: Secondary | ICD-10-CM | POA: Diagnosis not present

## 2014-10-31 DIAGNOSIS — Z87442 Personal history of urinary calculi: Secondary | ICD-10-CM | POA: Insufficient documentation

## 2014-10-31 DIAGNOSIS — I10 Essential (primary) hypertension: Secondary | ICD-10-CM | POA: Diagnosis not present

## 2014-10-31 DIAGNOSIS — C099 Malignant neoplasm of tonsil, unspecified: Secondary | ICD-10-CM

## 2014-10-31 DIAGNOSIS — I2510 Atherosclerotic heart disease of native coronary artery without angina pectoris: Secondary | ICD-10-CM | POA: Diagnosis not present

## 2014-10-31 DIAGNOSIS — F419 Anxiety disorder, unspecified: Secondary | ICD-10-CM | POA: Insufficient documentation

## 2014-10-31 DIAGNOSIS — E1140 Type 2 diabetes mellitus with diabetic neuropathy, unspecified: Secondary | ICD-10-CM | POA: Diagnosis not present

## 2014-10-31 DIAGNOSIS — F329 Major depressive disorder, single episode, unspecified: Secondary | ICD-10-CM | POA: Insufficient documentation

## 2014-10-31 DIAGNOSIS — Z800 Family history of malignant neoplasm of digestive organs: Secondary | ICD-10-CM | POA: Insufficient documentation

## 2014-10-31 DIAGNOSIS — E119 Type 2 diabetes mellitus without complications: Secondary | ICD-10-CM | POA: Diagnosis not present

## 2014-10-31 DIAGNOSIS — Z510 Encounter for antineoplastic radiation therapy: Secondary | ICD-10-CM | POA: Diagnosis present

## 2014-10-31 DIAGNOSIS — C090 Malignant neoplasm of tonsillar fossa: Secondary | ICD-10-CM | POA: Insufficient documentation

## 2014-10-31 DIAGNOSIS — Z85819 Personal history of malignant neoplasm of unspecified site of lip, oral cavity, and pharynx: Secondary | ICD-10-CM | POA: Insufficient documentation

## 2014-10-31 DIAGNOSIS — B977 Papillomavirus as the cause of diseases classified elsewhere: Secondary | ICD-10-CM | POA: Diagnosis not present

## 2014-10-31 DIAGNOSIS — E1142 Type 2 diabetes mellitus with diabetic polyneuropathy: Secondary | ICD-10-CM

## 2014-10-31 HISTORY — DX: Allergy, unspecified, initial encounter: T78.40XA

## 2014-10-31 HISTORY — DX: Malignant (primary) neoplasm, unspecified: C80.1

## 2014-10-31 NOTE — Progress Notes (Signed)
[facility] CONSULT NOTE  Patient Care Team: Doe-Hyun Kyra Searles, DO as PCP - General  CHIEF COMPLAINTS/PURPOSE OF CONSULTATION:   HPV positive right tonsil cancer  HISTORY OF PRESENTING ILLNESS:  Kevin Mcclain 69 y.o. male is here because of newly diagnosed right tonsil cancer According to the patient, the first initial presentation was due to  Sensation of a foreign object in his throat for several months, at least the last 3 months. He also have chronic sinus drainage. He is very mild throat discomfort. he denies any hearing deficit, difficulties with chewing food, swallowing difficulties, painful swallowing, changes in the quality of his voice or abnormal weight loss. He subsequently underwent further evaluation.   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    The patient denies smoking or drinking alcohol. He does have peripheral neuropathy affected by his diabetes. He denies any signs and symptoms of angina or congestive heart failure. He has significant cardiac history in the past.  MEDICAL HISTORY:  Past Medical History  Diagnosis Date  . Depression   . Arthritis   . Diabetes mellitus type II   . Kidney stones   . CAD (coronary artery disease)     severe CAD-s/p MI 2000 with PTCA, s/p CABG 2005-by Dr Vanetta Mulders at [facility] EF 50-55%  . Hypertension   . Hyperlipidemia   . Gout   . Vertigo   . Cancer 10/12/2014    right  tonsil=squamous cell  carcinaoma  . Allergy   . Anxiety     SURGICAL HISTORY: Past Surgical History  Procedure Laterality Date  . Coronary artery bypass graft  2005    5 grafts  . Cholecystectomy    . Colonoscopy      SOCIAL HISTORY: Social History   Social History  . Marital Status: Married    Spouse Name: N/A  . Number of Children: N/A  . Years of Education: N/A   Occupational History  . Not on file.   Social History Main Topics  . Smoking status: Never Smoker   . Smokeless tobacco: Never Used  . Alcohol Use: No  . Drug Use: No  . Sexual Activity: Not on file   Other Topics Concern  . Not on file   Social History Narrative   Occupation :  Retired Arboriculturist for [employer].     Married   Tobacco use - no   Alcohol use-no         Physician roster:   Cardiologist-Dr. Stanford Breed   Gastroenterologist-Dr. Pyrtle    FAMILY HISTORY: Family History  Problem Relation Age of Onset  . Heart attack Mother     deceased age 4 secondary  . Heart disease Mother   . Colon cancer Father   . Stroke Father     deceased age 80  . Heart disease Father   . Cancer Father     colon  .  Stroke Sister 63  . Heart disease Sister   . Coronary artery disease Brother     s/p bypass surgery  . Heart disease Brother   . Heart disease Brother     ALLERGIES:  is allergic to benzonatate; codeine; and sitagliptin phosphate.  MEDICATIONS:  Current Outpatient Prescriptions  Medication Sig Dispense Refill  . allopurinol (ZYLOPRIM) 300 MG tablet TAKE 1 TABLET BY MOUTH EVERY DAY 90 tablet 1  . allopurinol (ZYLOPRIM) 300 MG tablet TAKE 1 TABLET BY MOUTH EVERY DAY 90 tablet 1  . amLODipine (NORVASC) 10 MG tablet TAKE 1 TABLET BY MOUTH EVERY DAY 90 tablet 1  . amLODipine (NORVASC) 10 MG tablet TAKE 1 TABLET BY MOUTH EVERY DAY 90 tablet 1  . aspirin 325 MG tablet Take 325 mg by mouth daily.      Marland Kitchen atorvastatin (LIPITOR) 20 MG tablet TAKE 1 TABLET BY MOUTH EVERY DAY  90 tablet 0  . colchicine 0.6 MG tablet Take 1 tablet (0.6 mg total) by mouth daily as needed. 90 tablet 1  . desoximetasone (TOPICORT) 0.25 % cream APPLY TOPICALLY TWICE DAILY 30 g 5  . HYDROcodone-acetaminophen (NORCO/VICODIN) 5-325 MG per tablet Take 1 tablet by mouth every 12 (twelve) hours as needed. 60 tablet 0  . metFORMIN (GLUCOPHAGE) 500 MG tablet TAKE 1 TABLET BY MOUTH 3 TIMES A DAY 270 tablet 1  . metFORMIN (GLUCOPHAGE) 500 MG tablet TAKE 1 TABLET BY MOUTH 3 TIMES A DAY 270 tablet 1  . metoprolol succinate (TOPROL-XL) 50 MG 24 hr tablet TAKE 1 TABLET BY MOUTH DAILY. TAKE WITH OR IMMEDIATELY FOLLOWING A MEAL 90 tablet 1  . nitroGLYCERIN (NITROSTAT) 0.4 MG SL tablet Place 1 tablet (0.4 mg total) under the tongue every 5 (five) minutes as needed. 50 tablet 11  . POTASSIUM CHLORIDE CR PO Take by mouth daily.      . ramipril (ALTACE) 5 MG capsule TAKE 2 CAPSULES BY MOUTH EVERY DAY 180 capsule 1  . ranitidine (ZANTAC) 75 MG tablet Take 75 mg by mouth as needed.     . sulindac (CLINORIL) 200 MG tablet Take 0.5 tablets (100 mg total) by mouth daily as needed. 45 tablet 1  . tamsulosin (FLOMAX) 0.4 MG CAPS capsule Take 0.4 mg by mouth as needed.     No current facility-administered medications for this visit.   Facility-Administered Medications Ordered in Other Visits  Medication Dose Route Frequency Provider Last Rate Last Dose  . fludeoxyglucose F - 18 (FDG) injection 7.3 milli Curie  7.3 milli Curie Intravenous Once PRN Medication Radiologist, MD   7.3 milli Curie at 10/29/14 0724    REVIEW OF SYSTEMS:   Constitutional: Denies fevers, chills or abnormal night sweats Eyes: Denies blurriness of vision, double vision or watery eyes Ears, nose, mouth, throat, and face: Denies mucositis  Respiratory: Denies cough, dyspnea or wheezes Cardiovascular: Denies palpitation, chest discomfort or lower extremity swelling Gastrointestinal:  Denies nausea, heartburn or change in bowel habits Skin:  Denies abnormal skin rashes Lymphatics: Denies new lymphadenopathy or easy bruising Neurological:Denies numbness, tingling or new weaknesses Behavioral/Psych: Mood is stable, no new changes  All other systems were reviewed with the patient and are negative.  PHYSICAL EXAMINATION: ECOG PERFORMANCE STATUS: 0 - Asymptomatic  Filed Vitals:   10/31/14 1208  BP: 123/55  Pulse: 71  Temp: 98 F (36.7 C)  Resp: 18   Filed Weights   10/31/14 1208  Weight: 155 lb 4.8 oz (70.444 kg)    GENERAL:alert, no distress and  comfortable SKIN: skin color, texture, turgor are normal, no rashes or significant lesions EYES: normal, conjunctiva are pink and non-injected, sclera clear OROPHARYNX: noted right tonsil mass.  NECK: supple, thyroid normal size, non-tender, without nodularity LYMPH:  no palpable lymphadenopathy in the cervical, axillary or inguinal LUNGS: clear to auscultation and percussion with normal breathing effort HEART: regular rate & rhythm and no murmurs and no lower extremity edema ABDOMEN:abdomen soft, non-tender and normal bowel sounds Musculoskeletal:no cyanosis of digits and no clubbing  PSYCH: alert & oriented x 3 with fluent speech NEURO: no focal motor/sensory deficits  LABORATORY DATA:  I have reviewed the data as listed Lab Results  Component Value Date   WBC 10.1 12/04/2013   HGB 15.5 12/04/2013   HCT 46.9 12/04/2013   MCV 92.8 12/04/2013   PLT 240.0 12/04/2013   Lab Results  Component Value Date   NA 138 10/10/2014   K 4.8 10/10/2014   CL 102 10/10/2014   CO2 28 10/10/2014    RADIOGRAPHIC STUDIES: I reviewed the imaging study with the patient and family I have personally reviewed the radiological images as listed and agreed with the findings in the report. Nm Pet Image Initial (pi) Skull Base To Thigh  10/29/2014   CLINICAL DATA:  Initial treatment strategy for right tonsillar carcinoma.  EXAM: NUCLEAR MEDICINE PET SKULL BASE TO THIGH  TECHNIQUE: 7.3 mCi  F-18 FDG was injected intravenously. Full-ring PET imaging was performed from the skull base to thigh after the radiotracer. CT data was obtained and used for attenuation correction and anatomic localization.  FASTING BLOOD GLUCOSE:  Value: 105 mg/dl  COMPARISON:  None.  FINDINGS: NECK  Soft tissue mass in the right palatine tonsil measures approximately 3.2 x 3.1 cm on image 24/series 4 and is hypermetabolic, with SUV max of 15.5. This is consistent with known tonsillar carcinoma.  No hypermetabolic lymph nodes in the neck.  Opacification of right maxillary sinus noted which shows peripheral hypermetabolic activity, consistent with maxillary sinusitis.  CHEST  No hypermetabolic mediastinal or hilar nodes. No suspicious pulmonary nodules on the CT scan.  ABDOMEN/PELVIS  No abnormal hypermetabolic activity within the liver, pancreas, adrenal glands, or spleen. No hypermetabolic lymph nodes in the abdomen or pelvis.  Several benign-appearing renal cysts are noted bilaterally. Left-sided colonic diverticulosis is also noted, without evidence of diverticulitis.  SKELETON  No focal hypermetabolic activity to suggest skeletal metastasis.  IMPRESSION: 3.2 cm hypermetabolic mass in the right palatine tonsil, consistent with known tonsillar carcinoma.  No evidence of cervical nodal or distant metastatic disease.   Electronically Signed   By: Earle Gell M.D.   On: 10/29/2014 09:06    ASSESSMENT:  Newly diagnosed squamous cell carcinoma of the Head & Neck, HPV Positive  PLAN:  Tonsil cancer  I reviewed the current guidelines with him and family. We will present his case at the next ENT tumor board. We are waiting contrast-enhanced CT scan to determine the depth of the tumor. By clinical examined measurement on the size of the tumor, we felt that his disease stage if cT2N0M0. If that is confirmed on contrast-enhanced CT scan, the patient would not require systemic chemotherapy.  We will call the patient next week  after ENT tumor board to determine the next course of action.  Type 2 diabetes mellitus with peripheral neuropathy He has debilitating, painful neuropathy affecting his fingers. However, his diabetes appeared to be fairly under control. Recommend he hold sulindac, ramipril and metformin the day before, the day  of and the day after CT scan and to drink plenty of fluids afterwards to reduce the risk of contrast nephropathy.    All questions were answered. The patient knows to call the clinic with any problems, questions or concerns. I spent 40 minutes counseling the patient face to face. The total time spent in the appointment was 55 minutes and more than 50% was on counseling.     [facility], Nikki Rusnak, MD 10/31/2014 4:34 PM

## 2014-10-31 NOTE — Assessment & Plan Note (Signed)
I reviewed the current guidelines with him and family. We will present his case at the next ENT tumor board. We are waiting contrast-enhanced CT scan to determine the depth of the tumor. By clinical examined measurement on the size of the tumor, we felt that his disease stage if cT2N0M0. If that is confirmed on contrast-enhanced CT scan, the patient would not require systemic chemotherapy.  We will call the patient next week after ENT tumor board to determine the next course of action.

## 2014-10-31 NOTE — Assessment & Plan Note (Signed)
He has debilitating, painful neuropathy affecting his fingers. However, his diabetes appeared to be fairly under control. Recommend he hold sulindac, ramipril and metformin the day before, the day of and the day after CT scan and to drink plenty of fluids afterwards to reduce the risk of contrast nephropathy.

## 2014-10-31 NOTE — Progress Notes (Signed)
Radiation Oncology         [phone]) [phone] ________________________________  Initial outpatient Consultation  Name: Treon Kehl MRN: [id number]  Date: 10/31/2014  DOB: 12/26/1945  DG:LOVFIE Shawna Orleans, DO  Jodi Marble, MD   REFERRING PHYSICIAN: Jodi Marble, MD  DIAGNOSIS: Stage II T2N0M0 p16+ squamous cell carcinoma of the right tonsil   ICD-9-CM ICD-10-CM   1. Malignant neoplasm of tonsil 146.0 C09.9     HISTORY OF PRESENT ILLNESS::Leonell Meddings is a 69 y.o. male who presented with sensation of foreign body in throat past on and off 6 months, noticed change in his voice after working outside, issues with  frequent sinus infections in past.  Biopsy of right tonsil on 10/12/2014: revealed p16+ squamous cell carcinoma.  Tobacco/Marijuana/Snuff/ETOH use: Never smoker,never used smokeless tobacco,no alcohol or drug use   CT Scan of the Neck with Contrast pending for 11/06/2014 @ 9:20 am With and Without Contrast  PET Scan 10/29/14 revealed a 3.2 cm right tonsil mass with no metastases to nodes or distant sites.  Dr. Alvy Bimler appt scheduled 10/31/14  Patient has noted some difficulty swallowing.  He denies changes in weight.  He does report excess sputum, dry mouth, upset stomach, loose stools.  Lives in [place].  He had sinus issues for 8 years.  His primary noticed a tonsil mass.  PREVIOUS RADIATION THERAPY: No  PAST MEDICAL HISTORY:  has a past medical history of Depression; Arthritis; Diabetes mellitus type II; Kidney stones; CAD (coronary artery disease); Hypertension; Hyperlipidemia; Gout; Vertigo; Cancer (10/12/2014); Allergy; and Anxiety.    PAST SURGICAL HISTORY: Past Surgical History  Procedure Laterality Date  . Coronary artery bypass graft  2005    5 grafts  . Cholecystectomy      FAMILY HISTORY: family history includes Cancer in his father; Colon cancer in his father; Coronary artery disease in his brother; Heart attack in his mother; Heart disease in his brother,  brother, father, mother, and sister; Stroke in his father; Stroke (age of onset: 66) in his sister.  SOCIAL HISTORY:  reports that he has never smoked. He has never used smokeless tobacco. He reports that he does not drink alcohol or use illicit drugs.  ALLERGIES: Benzonatate; Codeine; and Sitagliptin phosphate  MEDICATIONS:  Current Outpatient Prescriptions  Medication Sig Dispense Refill  . allopurinol (ZYLOPRIM) 300 MG tablet TAKE 1 TABLET BY MOUTH EVERY DAY 90 tablet 1  . allopurinol (ZYLOPRIM) 300 MG tablet TAKE 1 TABLET BY MOUTH EVERY DAY 90 tablet 1  . amLODipine (NORVASC) 10 MG tablet TAKE 1 TABLET BY MOUTH EVERY DAY 90 tablet 1  . amLODipine (NORVASC) 10 MG tablet TAKE 1 TABLET BY MOUTH EVERY DAY 90 tablet 1  . aspirin 325 MG tablet Take 325 mg by mouth daily.      Marland Kitchen atorvastatin (LIPITOR) 20 MG tablet TAKE 1 TABLET BY MOUTH EVERY DAY 90 tablet 0  . colchicine 0.6 MG tablet Take 1 tablet (0.6 mg total) by mouth daily as needed. 90 tablet 1  . desoximetasone (TOPICORT) 0.25 % cream APPLY TOPICALLY TWICE DAILY 30 g 5  . HYDROcodone-acetaminophen (NORCO/VICODIN) 5-325 MG per tablet Take 1 tablet by mouth every 12 (twelve) hours as needed. 60 tablet 0  . metFORMIN (GLUCOPHAGE) 500 MG tablet TAKE 1 TABLET BY MOUTH 3 TIMES A DAY 270 tablet 1  . metFORMIN (GLUCOPHAGE) 500 MG tablet TAKE 1 TABLET BY MOUTH 3 TIMES A DAY 270 tablet 1  . metoprolol succinate (TOPROL-XL) 50 MG 24 hr tablet TAKE 1 TABLET  BY MOUTH DAILY. TAKE WITH OR IMMEDIATELY FOLLOWING A MEAL 90 tablet 1  . nitroGLYCERIN (NITROSTAT) 0.4 MG SL tablet Place 1 tablet (0.4 mg total) under the tongue every 5 (five) minutes as needed. 50 tablet 11  . POTASSIUM CHLORIDE CR PO Take by mouth daily.      . predniSONE (DELTASONE) 20 MG tablet Take 1 tab twice daily for 4 days, then 1/2 tab twice daily for 4 days, then 1/2 tab once daily for 4 days (Patient not taking: Reported on 10/31/2014) 14 tablet 0  . ramipril (ALTACE) 5 MG capsule  TAKE 2 CAPSULES BY MOUTH EVERY DAY 180 capsule 1  . ranitidine (ZANTAC) 75 MG tablet Take 75 mg by mouth as needed.     . sulindac (CLINORIL) 200 MG tablet Take 0.5 tablets (100 mg total) by mouth daily as needed. 45 tablet 1  . tamsulosin (FLOMAX) 0.4 MG CAPS capsule Take 0.4 mg by mouth as needed.     No current facility-administered medications for this encounter.   Facility-Administered Medications Ordered in Other Encounters  Medication Dose Route Frequency Provider Last Rate Last Dose  . fludeoxyglucose F - 18 (FDG) injection 7.3 milli Curie  7.3 milli Curie Intravenous Once PRN Medication Radiologist, MD   7.3 milli Curie at 10/29/14 0724    REVIEW OF SYSTEMS:  Notable for that above.   PHYSICAL EXAM:  weight is 154 lb 9.6 oz (70.126 kg). His oral temperature is 98.3 F (36.8 C). His blood pressure is 138/66 and his pulse is 65. His respiration is 12 and oxygen saturation is 98%.   General: Alert and oriented, in no acute distress HEENT: Head is normocephalic. Extraocular movements are intact. Oropharynx is notable for papillary appearing right tonsil mass.  Tongue is midline.  He has no partials or dentures.  He has some bridges.  He has metallic fillings. No thrush. Neck: Neck is notable for no palpable lymphadenopathy. Heart: Regular in rate and rhythm with no murmurs, rubs, or gallops. Chest: Clear to auscultation bilaterally, with no rhonchi, wheezes, or rales. Abdomen: Soft, nontender, nondistended, with no rigidity or guarding. Extremities: No cyanosis or edema. Lymphatics: see Neck Exam Skin: No concerning lesions. Musculoskeletal: symmetric strength and muscle tone throughout. Neurologic: Cranial nerves II through XII are grossly intact. No obvious focalities. Speech is fluent. Coordination is intact. Psychiatric: Judgment and insight are intact. Affect is appropriate.   ECOG = 0  0 - Asymptomatic (Fully active, able to carry on all predisease activities without  restriction)  1 - Symptomatic but completely ambulatory (Restricted in physically strenuous activity but ambulatory and able to carry out work of a light or sedentary nature. For example, light housework, office work)  2 - Symptomatic, <50% in bed during the day (Ambulatory and capable of all self care but unable to carry out any work activities. Up and about more than 50% of waking hours)  3 - Symptomatic, >50% in bed, but not bedbound (Capable of only limited self-care, confined to bed or chair 50% or more of waking hours)  4 - Bedbound (Completely disabled. Cannot carry on any self-care. Totally confined to bed or chair)  5 - Death   Eustace Pen MM, Creech RH, Tormey DC, et al. [phone]). "Toxicity and response criteria of the [facility]". [place] Oncol. 5 (6): 649-55   LABORATORY DATA:  Lab Results  Component Value Date   WBC 10.1 12/04/2013   HGB 15.5 12/04/2013   HCT 46.9 12/04/2013  MCV 92.8 12/04/2013   PLT 240.0 12/04/2013   CMP     Component Value Date/Time   NA 138 10/10/2014 1443   K 4.8 10/10/2014 1443   CL 102 10/10/2014 1443   CO2 28 10/10/2014 1443   GLUCOSE 106* 10/10/2014 1443   BUN 14 10/10/2014 1443   CREATININE 0.87 10/10/2014 1443   CREATININE 0.81 09/19/2010 1339   CALCIUM 9.9 10/10/2014 1443   PROT 7.3 12/04/2013 0911   ALBUMIN 4.5 12/04/2013 0911   AST 25 12/04/2013 0911   ALT 28 12/04/2013 0911   ALKPHOS 64 12/04/2013 0911   BILITOT 1.0 12/04/2013 0911   GFRNONAA >60 09/05/2010 0610   GFRAA >60 09/05/2010 0610      Lab Results  Component Value Date   TSH 2.65 12/04/2013      RADIOGRAPHY: Nm Pet Image Initial (pi) Skull Base To Thigh  10/29/2014   CLINICAL DATA:  Initial treatment strategy for right tonsillar carcinoma.  EXAM: NUCLEAR MEDICINE PET SKULL BASE TO THIGH  TECHNIQUE: 7.3 mCi F-18 FDG was injected intravenously. Full-ring PET imaging was performed from the skull base to thigh after the radiotracer. CT data  was obtained and used for attenuation correction and anatomic localization.  FASTING BLOOD GLUCOSE:  Value: 105 mg/dl  COMPARISON:  None.  FINDINGS: NECK  Soft tissue mass in the right palatine tonsil measures approximately 3.2 x 3.1 cm on image 24/series 4 and is hypermetabolic, with SUV max of 15.5. This is consistent with known tonsillar carcinoma.  No hypermetabolic lymph nodes in the neck.  Opacification of right maxillary sinus noted which shows peripheral hypermetabolic activity, consistent with maxillary sinusitis.  CHEST  No hypermetabolic mediastinal or hilar nodes. No suspicious pulmonary nodules on the CT scan.  ABDOMEN/PELVIS  No abnormal hypermetabolic activity within the liver, pancreas, adrenal glands, or spleen. No hypermetabolic lymph nodes in the abdomen or pelvis.  Several benign-appearing renal cysts are noted bilaterally. Left-sided colonic diverticulosis is also noted, without evidence of diverticulitis.  SKELETON  No focal hypermetabolic activity to suggest skeletal metastasis.  IMPRESSION: 3.2 cm hypermetabolic mass in the right palatine tonsil, consistent with known tonsillar carcinoma.  No evidence of cervical nodal or distant metastatic disease.   Electronically Signed   By: Earle Gell M.D.   On: 10/29/2014 09:06      IMPRESSION/PLAN:  This is a delightful patient with head and neck cancer. I do recommend 7 weeks of radiotherapy for this patient.  We discussed the potential risks, benefits, and side effects of radiotherapy. We talked in detail about acute and late effects. We discussed that some of the most bothersome acute effects may be mucositis, dysgeusia, salivary changes, skin irritation, hair loss, dehydration, weight loss and fatigue. We talked about late effects which include but are not necessarily limited to dysphagia, hypothyroidism, nerve injury, spinal cord injury, xerostomia, trismus, and neck edema. No guarantees of treatment were given. A consent form was signed  and placed in the patient's medical record. The patient is enthusiastic about proceeding with treatment. I look forward to participating in the patient's care.    CT with contrast pending. Will discuss at tumor board.  Possible fungus ball removal from sinus by ENT before RT.    We also discussed that the treatment of head and neck cancer is a multidisciplinary process to maximize treatment outcomes and quality of life. For this reasons the following referrals have been or will be made:    Medical oncology to discuss chemotherapy - this  may be curative with RT alone.    Dentistry for dental evaluation, possible extractions in the radiation fields, and /or advice on reducing risk of cavities, osteoradionecrosis, or other oral issues.    Nutritionist for nutrition support during and after treatment.    Speech language pathology for swallowing and/or speech therapy.    Social work for social support.     Physical therapy due to risk of lymphedema in neck and deconditioning.  Simulation (treatment planning) will take place after clearance by dentistry. This document serves as a record of services personally performed by Eppie Gibson, MD. It was created on her behalf by Janace Hoard, a trained medical scribe. The creation of this record is based on the scribe's personal observations and the provider's statements to them. This document has been checked and approved by the attending provider.  __________________________________________   Eppie Gibson, MD

## 2014-10-31 NOTE — Progress Notes (Signed)
  Oncology Nurse Navigator Documentation   Navigator Encounter Type: Initial RadOnc (10/31/14 0830)         Interventions: Education Method (10/31/14 0830)       Met with patient during initial consult with Dr. Isidore Moos.  He was accompanied by his wife.  Dtr and s-in-law joined for tour. 1. Further introduced myself as his Navigator, explained my role as a member of the Care Team.   2. Provided New Patient Information packet, discussed contents:  Contact information for physician(s), myself, other members of the Care Team.  Advance Directive information ([place] blue pamphlet with LCSW contact info)  Fall Prevention Patient Safety Plan  Appointment Guideline  ACS Referral form  [facility] map with highlight of [place] 3. Provided introductory explanation of radiation treatment including SIM planning and purpose of Aquaplast head and shoulder mask, showed them example.   4. Provided photos/diagram of feeding tube, explained its purpose. 5. They verbalized understanding of Dr. Pearlie Oyster explanation of PET, that chemo not likely to be needed b/c tumor isolated to R tonsil, not involving neck LNs; that PEG not likely needed if no chemo. 6. Provided a tour of SIM and Tomo areas, explained treatment and arrival procedures. 7. I encouraged them to contact me with questions/concerns as treatments/procedures begin.  They verbalized understanding of information provided.    Gayleen Orem, RN, BSN, [facility] [place] [phone]          Time Spent with Patient: 120 (10/31/14 0830)

## 2014-11-01 ENCOUNTER — Ambulatory Visit (HOSPITAL_COMMUNITY): Payer: Self-pay | Admitting: Dentistry

## 2014-11-01 ENCOUNTER — Encounter (HOSPITAL_COMMUNITY): Payer: Self-pay | Admitting: Dentistry

## 2014-11-01 ENCOUNTER — Telehealth: Payer: Self-pay | Admitting: *Deleted

## 2014-11-01 VITALS — BP 130/55 | HR 59 | Temp 98.0°F

## 2014-11-01 DIAGNOSIS — K031 Abrasion of teeth: Secondary | ICD-10-CM

## 2014-11-01 DIAGNOSIS — C099 Malignant neoplasm of tonsil, unspecified: Secondary | ICD-10-CM | POA: Diagnosis not present

## 2014-11-01 DIAGNOSIS — IMO0002 Reserved for concepts with insufficient information to code with codable children: Secondary | ICD-10-CM

## 2014-11-01 DIAGNOSIS — K030 Excessive attrition of teeth: Secondary | ICD-10-CM

## 2014-11-01 DIAGNOSIS — K0530 Chronic periodontitis, unspecified: Secondary | ICD-10-CM

## 2014-11-01 DIAGNOSIS — Z01818 Encounter for other preprocedural examination: Secondary | ICD-10-CM | POA: Diagnosis not present

## 2014-11-01 DIAGNOSIS — M264 Malocclusion, unspecified: Secondary | ICD-10-CM

## 2014-11-01 DIAGNOSIS — K0850 Unsatisfactory restoration of tooth, unspecified: Secondary | ICD-10-CM

## 2014-11-01 DIAGNOSIS — K036 Deposits [accretions] on teeth: Secondary | ICD-10-CM

## 2014-11-01 DIAGNOSIS — K045 Chronic apical periodontitis: Secondary | ICD-10-CM | POA: Insufficient documentation

## 2014-11-01 DIAGNOSIS — K08409 Partial loss of teeth, unspecified cause, unspecified class: Secondary | ICD-10-CM

## 2014-11-01 NOTE — Telephone Encounter (Signed)
CALLED PATIENT TO INFORM OF APPTS., LVM FOR A RETURN CALL 

## 2014-11-01 NOTE — Progress Notes (Signed)
DENTAL CONSULTATION  Date of Consultation:  11/01/2014 Patient Name:   Lee Kuang Date of Birth:   08/26/1945 Medical Record Number: [id number]  VITALS: BP 130/55 mmHg  Pulse 59  Temp(Src) 98 F (36.7 C) (Oral)  CHIEF COMPLAINT: Patient referred by Dr. Erik Obey for a dental consultation.  HPI: Visente Kirker is a 69 year old male recently diagnosed with squamous cell carcinoma of the right tonsil. Patient with Anticipated radiation therapy and possible chemotherapy. Patient now seen as part of a pre-chemoradiation therapy dental protocol examination.   Patient currently denies acute toothaches, swellings, or abscesses. Patient was last seen by dentist at least 1-3 years ago. Patient had a filling at that time. Patient saw Dr. Earley Favor. Patient only sees the dentist "when I need to ". Most of his previous dental restorations were performed by a dentist in [place], [place]. Patient has been in [place] for the past 10 years. Patient did not seek her regular dental care due to previous Plavix therapy and risk for bleeding. Patient has not been on Plavix for the past 5-6 months by report.   PROBLEM LIST: Patient Active Problem List   Diagnosis Date Noted  . Tonsil cancer 10/31/2014    Priority: High  . Preventative health care 01/19/2014  . Arthropathy 03/10/2013  . Numbness and tingling sensation of skin 05/03/2012  . Low back pain 11/26/2011  . Kidney stone 11/04/2011  . Osteoarthritis 11/18/2010  . CAD 10/11/2008  . DEGENERATIVE JOINT DISEASE, FINGERS 12/27/2007  . Gout 03/28/2007  . Type 2 diabetes mellitus with peripheral neuropathy 01/24/2007  . HYPERLIPIDEMIA 01/24/2007  . Essential hypertension 01/24/2007  . SHOULDER PAIN, LEFT 01/24/2007    PMH: Past Medical History  Diagnosis Date  . Depression   . Arthritis   . Diabetes mellitus type II   . Kidney stones   . CAD (coronary artery disease)     severe CAD-s/p MI 2000 with PTCA, s/p CABG 2005-by Dr  Vanetta Mulders at [facility] EF 50-55%  . Hypertension   . Hyperlipidemia   . Gout   . Vertigo   . 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 Allergy   . Anxiety     PSH: Past Surgical History  Procedure Laterality Date  . Coronary artery bypass graft  2005    5 grafts  . Cholecystectomy    . Colonoscopy      ALLERGIES: Allergies  Allergen Reactions  . Benzonatate   . Codeine   . Sitagliptin Phosphate     MEDICATIONS: Current Outpatient Prescriptions  Medication Sig Dispense Refill  . allopurinol (ZYLOPRIM) 300 MG tablet TAKE 1 TABLET BY MOUTH EVERY DAY 90 tablet 1  . allopurinol (ZYLOPRIM) 300 MG tablet TAKE 1 TABLET BY MOUTH EVERY DAY 90 tablet 1  . amLODipine (NORVASC) 10 MG tablet TAKE 1 TABLET BY MOUTH EVERY DAY 90 tablet 1  . amLODipine (NORVASC) 10 MG tablet TAKE 1 TABLET BY MOUTH EVERY DAY 90 tablet 1  . aspirin 325 MG tablet Take 325 mg by mouth daily.      Marland Kitchen atorvastatin (LIPITOR) 20 MG tablet TAKE 1 TABLET BY MOUTH EVERY DAY 90 tablet 0  . colchicine 0.6 MG tablet Take 1 tablet (0.6 mg total) by mouth daily as needed. 90 tablet 1  . desoximetasone (TOPICORT) 0.25 % cream APPLY TOPICALLY TWICE DAILY 30 g 5  . HYDROcodone-acetaminophen (NORCO/VICODIN) 5-325 MG per tablet Take 1 tablet by mouth every 12 (twelve) hours as needed. [place]  tablet 0  . metFORMIN (GLUCOPHAGE) 500 MG tablet TAKE 1 TABLET BY MOUTH 3 TIMES A DAY 270 tablet 1  . metFORMIN (GLUCOPHAGE) 500 MG tablet TAKE 1 TABLET BY MOUTH 3 TIMES A DAY 270 tablet 1  . metoprolol succinate (TOPROL-XL) 50 MG 24 hr tablet TAKE 1 TABLET BY MOUTH DAILY. TAKE WITH OR IMMEDIATELY FOLLOWING A MEAL 90 tablet 1  . nitroGLYCERIN (NITROSTAT) 0.4 MG SL tablet Place 1 tablet (0.4 mg total) under the tongue every 5 (five) minutes as needed. 50 tablet 11  . POTASSIUM CHLORIDE CR PO Take by mouth daily.      . ramipril (ALTACE) 5 MG capsule TAKE 2 CAPSULES BY MOUTH EVERY DAY 180 capsule 1  . ranitidine (ZANTAC)  75 MG tablet Take 75 mg by mouth as needed.     . sulindac (CLINORIL) 200 MG tablet Take 0.5 tablets (100 mg total) by mouth daily as needed. 45 tablet 1  . tamsulosin (FLOMAX) 0.4 MG CAPS capsule Take 0.4 mg by mouth as needed.     No current facility-administered medications for this visit.   Facility-Administered Medications Ordered in Other Visits  Medication Dose Route Frequency Provider Last Rate Last Dose  . fludeoxyglucose F - 18 (FDG) injection 7.3 milli Curie  7.3 milli Curie Intravenous Once PRN Medication Radiologist, MD   7.3 milli Curie at 10/29/14 0724    LABS: Lab Results  Component Value Date   WBC 10.1 12/04/2013   HGB 15.5 12/04/2013   HCT 46.9 12/04/2013   MCV 92.8 12/04/2013   PLT 240.0 12/04/2013      Component Value Date/Time   NA 138 10/10/2014 1443   K 4.8 10/10/2014 1443   CL 102 10/10/2014 1443   CO2 28 10/10/2014 1443   GLUCOSE 106* 10/10/2014 1443   BUN 14 10/10/2014 1443   CREATININE 0.87 10/10/2014 1443   CREATININE 0.81 09/19/2010 1339   CALCIUM 9.9 10/10/2014 1443   GFRNONAA >60 09/05/2010 0610   GFRAA >60 09/05/2010 0610   No results found for: INR, PROTIME No results found for: PTT  SOCIAL HISTORY: Social History   Social History  . Marital Status: Married    Spouse Name: N/A  . Number of Children: N/A  . Years of Education: N/A   Occupational History  . Not on file.   Social History Main Topics  . Smoking status: Never Smoker   . Smokeless tobacco: Never Used  . Alcohol Use: No  . Drug Use: No  . Sexual Activity: Not on file   Other Topics Concern  . Not on file   Social History Narrative   Occupation :  Retired Arboriculturist for [employer].     Married   Tobacco use - no   Alcohol use-no         Physician roster:   Cardiologist-Dr. Stanford Breed   Gastroenterologist-Dr. Pyrtle    FAMILY HISTORY: Family History  Problem Relation Age of Onset  . Heart attack Mother     deceased age 50 secondary   . Heart disease Mother   . Colon cancer Father   . Stroke Father     deceased age 60  . Heart disease Father   . Cancer Father     colon  . Stroke Sister 40  . Heart disease Sister   . Coronary artery disease Brother     s/p bypass surgery  . Heart disease Brother   . Heart disease Brother     REVIEW  OF SYSTEMS: Reviewed with the patient and is included in dental record.  DENTAL HISTORY: CHIEF COMPLAINT: Patient referred by Dr. Erik Obey for a dental consultation.  HPI: Montee Tallman is a 69 year old male recently diagnosed with squamous cell carcinoma of the right tonsil. Patient with Anticipated radiation therapy and possible chemotherapy. Patient now seen as part of a pre-chemoradiation therapy dental protocol examination.   Patient currently denies acute toothaches, swellings, or abscesses. Patient was last seen by dentist at least 1-3 years ago. Patient had a filling at that time. Patient saw Dr. Earley Favor. Patient only sees the dentist "when I need to ". Most of his previous dental restorations were performed by a dentist in [place], [place]. Patient has been in [place] for the past 10 years. Patient did not seek her regular dental care due to previous Plavix therapy and risk for bleeding. Patient has not been on Plavix for the past 5-6 months by report.   DENTAL EXAMINATION: GENERAL: The patient is a well-developed, well-nourished male in no acute distress. HEAD AND NECK: I am unable to palpate any submandibular lymphadenopathy. Patient denies acute TMJ symptoms. Maximum interincisal opening is 35 mm. INTRAORAL EXAM: Patient has normal saliva. Patient has bilateral mandibular lingual tori. There is no evidence of oral abscess formation. DENTITION: Patient is missing tooth numbers 1, 3, 8, 14, 17, 18, 19, 30, and 32. PERIODONTAL: Patient has chronic periodontitis with plaque and calculus accumulations, gingival recession, and incipient tooth mobility. There is  extensive gingival recession associated with the lingual aspect of tooth numbers 2 and 15. DENTAL CARIES/SUBOPTIMAL RESTORATIONS: There are no obvious dental caries. There are several suboptimal dental restorations at the margins of several crowns per dental charting form. ENDODONTIC: Patient currently denies acute pulpitis symptoms. Patient does appear to have periapical pathology associated with the apex of tooth #2. CROWN AND BRIDGE: There are multiple crown or bridge restorations noted. Patient has suboptimal margins on the distal of #9 and the mesial and distal of the crown on tooth #22. PROSTHODONTIC: Patient has no partial dentures. OCCLUSION: Patient has a poor occlusal scheme secondary to multiple missing teeth, supra-eruption and drifting of the unopposed teeth into the edentulous areas, and lack of replacement of all missing teeth with dental prostheses.  RADIOGRAPHIC INTERPRETATION: An orthopantogram was taken and supplemented with a full series of dental radiographs. There are multiple missing teeth. There is supra-eruption and drifting of the unopposed teeth into the edentulous areas. There is periapical radiolucency associated with the apex of tooth #2. Patient has previous root canal therapies associated with tooth numbers 20, 22, and 29. The maxillary right sinus appears to have increased opacification when compared to the maxillary left sinus. Suboptimal margins on the crown on the distal of #9 and the mesial and distal of #22.  ASSESSMENTS: 1. Squamous cell carcinoma of the right tonsil 2. Pre-chemoradiation therapy dental protocol 3. Chronic apical periodontitis 4. Chronic periodontitis with bone 5. Gingival recession 6. Incipient tooth mobility 7. Multiple missing teeth 8. Supra-eruption and drifting of the unopposed teeth into the edentulous areas 9. Multiple flexure lesions 10. Suboptimal crown margins on tooth numbers 9 and 22. 11. Mandibular anterior interincisal  attrition 12. Poor occlusal scheme but stable occlusion 13. Bilateral mandibular lingual tori 14. Facial exostoses area #22 through 27. 15. Multiple rotated teeth 16. Risk for sinus exposure with anticipated extraction of tooth #2.  PLAN/RECOMMENDATIONS: 1. I discussed the risks, benefits, and complications of various treatment options with the patient in relationship to his  medical and dental conditions, anticipated radiation therapy and possible chemotherapy, and the risk for xerostomia, radiation caries, trismus, mucositis, taste changes, gum and jawbone changes, and risk for infection and osteoradionecrosis. We discussed various treatment options to include no treatment, multiple extractions with alveoloplasty, pre-prosthetic surgery as indicated, periodontal therapy, dental restorations, root canal therapy, crown and bridge therapy, implant therapy, and replacement of missing teeth as indicated. We also discussed fabrication of fluoride trays and scatter protection devices. We also discussed referral to an oral surgeon for second opinion concerning extraction of tooth numbers 2, 16, and 31 with alveoloplasty and pre-prosthetic surgery as needed with sectioning of bridge at the distal of #29 as indicated.  The patient currently wishes to defer any dental treatment decisions until CT scan has been obtained next Tuesday, and his case is discussed at the head and neck cancer conference scheduled for next Wednesday.  The patient did agree to proceed with impressions today for future fabrication of fluoride trays and scatter guards. A prescription for FluoriSHIELD will be provided at that time.  2. Discussion of findings with medical team and coordination of future medical and dental care as needed. Will need to discuss anticipated ports of radiation therapy and doses with Dr. Isidore Moos after head and neck cancer conference. Will also need to discuss anticiapted care of maxillary right sinus by the ENT  team.  I spent in excess of  120 minutes during the conduct of this consultation and >50% of this time involved direct face-to-face encounter for counseling and/or coordination of the patient's care.    Lenn Cal, DDS

## 2014-11-01 NOTE — Patient Instructions (Signed)

## 2014-11-02 ENCOUNTER — Other Ambulatory Visit (HOSPITAL_COMMUNITY): Payer: Self-pay | Admitting: Radiation Oncology

## 2014-11-02 DIAGNOSIS — R1314 Dysphagia, pharyngoesophageal phase: Secondary | ICD-10-CM

## 2014-11-06 ENCOUNTER — Ambulatory Visit
Admission: RE | Admit: 2014-11-06 | Discharge: 2014-11-06 | Disposition: A | Discharge status: Home / Self Care | Payer: Medicare Other | Source: Ambulatory Visit | Attending: Otolaryngology | Admitting: Otolaryngology

## 2014-11-06 DIAGNOSIS — C099 Malignant neoplasm of tonsil, unspecified: Secondary | ICD-10-CM

## 2014-11-06 MED ORDER — IOPAMIDOL (ISOVUE-300) INJECTION 61%
75.0000 mL | Freq: Once | INTRAVENOUS | Status: AC | PRN
  Administered 2014-11-06: 75 mL via INTRAVENOUS

## 2014-11-06 NOTE — Progress Notes (Signed)
  Oncology Nurse Navigator Documentation   Navigator Encounter Type: Initial MedOnc (10/31/14 1215)         Interventions: Education Method (10/31/14 1215)     Met with patient during initial consult with Dr. Alvy Bimler.  He was accompanied by his wife. He verbalized understanding that inclusion of chemo in tmt plan depends on results of 9/6 CT neck/chest, decision of surgical option.  I confirmed his understanding of location of Dr. Ritta Slot office and [facility] for upcoming appts.   They verbalized understanding of information provided. I encouraged them to call with questions/concerns, they verbalized understanding.     Gayleen Orem, RN, BSN, [place] [facility] [place] [phone]        Time Spent with Patient: 45 (10/31/14 1215)

## 2014-11-07 ENCOUNTER — Encounter: Payer: Self-pay | Admitting: Radiation Oncology

## 2014-11-07 NOTE — Progress Notes (Signed)
Patient discussed at tumor board today.  CT imaging implies T2N1 disease, but Dr. Elson Areas still does not feel that chemotherapy's risks outweigh potential benefits.    Plan to proceed with dental extractions and surgical clearance of maxillary sinus disease in OR.  Patient will be referred back to me by dentistry for definitive RT.  -----------------------------------  Eppie Gibson, MD

## 2014-11-08 ENCOUNTER — Telehealth: Payer: Self-pay | Admitting: *Deleted

## 2014-11-08 NOTE — Telephone Encounter (Signed)
  Oncology Nurse Navigator Documentation   Navigator Encounter Type: Telephone (11/08/14 1257) Patient Visit Type: Follow-up (11/08/14 1257)     Per Dr. Alvy Bimler, called patient to inform that based on discussion of his case at this week's H&[facility] Tumor Board, including results of 9/6 Neck CT,  chemotherapy will not be part of his tmt plan.  I also explained discussion included coordination of a single surgery for removal of maxillary sinus fungal mass and tooth extractions.  He understands he will hear from Dr. Noreene Filbert office re scheduling of surgery.  Gayleen Orem, RN, BSN, [place] [facility] [place] [phone]                   Time Spent with Patient: 15 (11/08/14 1257)

## 2014-11-12 ENCOUNTER — Ambulatory Visit: Payer: Medicare Other | Attending: Hematology and Oncology

## 2014-11-12 ENCOUNTER — Ambulatory Visit: Payer: Self-pay

## 2014-11-12 ENCOUNTER — Other Ambulatory Visit: Payer: Self-pay | Admitting: Radiation Oncology

## 2014-11-12 ENCOUNTER — Other Ambulatory Visit (HOSPITAL_COMMUNITY): Payer: Self-pay | Admitting: Dentistry

## 2014-11-12 ENCOUNTER — Other Ambulatory Visit: Payer: Self-pay | Admitting: Otolaryngology

## 2014-11-12 DIAGNOSIS — R1312 Dysphagia, oropharyngeal phase: Secondary | ICD-10-CM | POA: Insufficient documentation

## 2014-11-12 NOTE — H&P (Signed)
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 Redmond Baseman 6 or 7 years ago with concerns about sinusitis.  He still has occasional sinus drainage which is sometimes odorous.     He has known reflux for which she takes occasional Zantac in the evening.  He recalls try Nexium at some point in the past but not steadily and does not recall the results.   For one month, he has had a sense of something in the RIGHT side of his throat.  No pain.  No bleeding.  No difficulty breathing, speaking, or swallowing.  He does not smoke or drink significantly.  No neck masses.  No history of cancer.  No change in weight, appetite, or energy.  No fevers or night sweats.  His family doctor earlier this week saw something and he is here for further evaluation.  The RIGHT tonsil biopsy shows SCCa, P16+.  I will call and share this with the patient.  I will plan a PET scan and may also need a CT neck.    PET scan dated August 29 shows activity in the known RIGHT tonsil cancer.  Nothing to suggest metastatic adenopathy.  There is some peripheral enhancing activity in the RIGHT maxillary sinus.  Reviewing the CT scan, he has bony osteitic thickening, and complete opacification with heterogeneous radio density in the RIGHT maxillary sinus consistent with a chronic "fungus ball".    this will likely need a surgical approach at a  later time.  I will call and discuss this with him.  I called and discussed the PET scan results with the patient and his wife. the RIGHT tonsil is positive as expected, but no evidence of lymph nodes.he is scheduled to see Dr. Isidore Moos, Dr. Alvy Bimler, Dr. Julieta Bellini have a CT scan of the neck in the near future.  treatment will likely be a combination of chemotherapy and radiation.  Dr. Constance Holster will present his case at tumor board next week and we will call with final conclusions.  He is aware of  RIGHT facial pressure and foul-smelling drainage from his RIGHT nose ongoing at least 8 months.  This would correspond with the maxillary "fungus ball" identified on the PET scan.  It is possible Dr. Alvy Bimler orDr. Isidore Moos would prefer not to give treatment prior to surgical resolution for this.  this could be accomplished in relatively short order.  This is unlikely to respond to antibiotics alone.    One month return visit.  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  He has seen Dr. Isidore Moos and Dr. Alvy Bimler.  He has seen Dr. Lawana Chambers and needs several teeth extracted.  A PET scan did demonstrate a lesion of the RIGHT maxillary sinus consistent with a "fungus ball" which is a variation on chronic maxillary sinusitis.  Dr. Isidore Moos would prefer her that we remove this potential source of infectious complication from the radiation.  We will coordinate this with Dr. Ritta Slot extractions.   I discussed the proposed procedure, namely RIGHT maxillary endoscopic antrostomy and anterior ethmoidectomy with evacuation of RIGHT maxillary sinus contents.  Risks and complications were discussed.  Questions were answered and informed consent was obtained.  I discussed nasal hygiene measures.  I gave him a prescription for hydrocodone for pain relief and amoxicillin antibiotics.  I discussed advancement of diet and  activity afterwards.  I will see him here 3 weeks after surgery  PMH: Past Medical History  Diagnosis Date  . Depression   . Arthritis   . Diabetes mellitus type II   . Kidney stones   . CAD (coronary artery disease)     severe CAD-s/p MI 2000 with PTCA, s/p CABG 2005-by Dr Vanetta Mulders at [place] EF 50-55%  . Hypertension   . Hyperlipidemia   . Gout   . Vertigo   . 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 Allergy   . Anxiety     Surg Hx: Past Surgical History  Procedure Laterality Date  . Coronary  artery bypass graft  2005    5 grafts  . Cholecystectomy    . Colonoscopy      FHx:   Family History  Problem Relation Age of Onset  . Heart attack Mother     deceased age 53 secondary  . Heart disease Mother   . Colon cancer Father   . Stroke Father     deceased age 6  . Heart disease Father   . Cancer Father     colon  . Stroke Sister 31  . Heart disease Sister   . Coronary artery disease Brother     s/p bypass surgery  . Heart disease Brother   . Heart disease Brother    SocHx:  reports that he has never smoked. He has never used smokeless tobacco. He reports that he does not drink alcohol or use illicit drugs.  ALLERGIES:  Allergies  Allergen Reactions  . Benzonatate Swelling    "swells my throat"  . Codeine Itching  . Sitagliptin Phosphate Itching     (Not in a hospital admission)  No results found for this or any previous visit (from the past 48 hour(s)). No results found.  ROS:  Systemic: Not feeling tired (fatigue).  No fever, no night sweats, and no recent weight loss. Head: Headache. Eyes: No eye symptoms. Otolaryngeal: No hearing loss, no earache, no tinnitus, and no purulent nasal discharge.  Nasal passage blockage (stuffiness).  No snoring, no sneezing, no hoarseness, and no sore throat. Cardiovascular: No chest pain or discomfort  and no palpitations. Pulmonary: No dyspnea, no cough, and no wheezing. Gastrointestinal: No dysphagia  and no heartburn.  No nausea, no abdominal pain, and no melena.  No diarrhea. Genitourinary: No dysuria. Endocrine: No muscle weakness. Musculoskeletal: Calf muscle cramps.  No arthralgias  and no soft tissue swelling. Neurological: No dizziness, no fainting, no tingling, and no numbness. Psychological: No anxiety  and no depression. Skin: No rash.  BP:129/59,  HR: 72 b/min,  BSA Calculated: 1.72 ,  BMI Calculated: 26.93 ,  Weight: 152 lb , BMI: 26.9 kg/m2,  Height: 5 ft 3 in  PHYSICAL EXAM: He is trim and  healthy.  Mental status is appropriate.  He hears well and conversational speech.  Voice is clear and respirations unlabored through the nose.  The head is atraumatic and neck is supple.  Ear canals are clear with scarring of the RIGHT tympanic membrane and a normal LEFT drum.  Anterior nose is moist and patent.  Oral cavity is clear with teeth in good repair.  Oropharynx reveals a 2.5 cm erythematous mass occupying the RIGHT tonsil.  Neck without adenopathy.   Using the flexible laryngoscope, the nasopharynx is clear.  Oropharynx shows the RIGHT tonsillar mass from above but no other lesions.  Hypopharynx and larynx are  clear.  He is stocky and healthy.  Anterior nose shows a rightward septal deviation with no obvious polyps or lesions.  Oral cavity again shows an enlarged erythematous RIGHT tonsil although not much different than one month ago.  No palpable neck adenopathy.   Lungs: Clear to auscultation Heart: Regular rate and rhythm without murmurs Abdomen: Soft, active Extremities: Normal configuration Neurologic: Symmetric, grossly intact.    Studies Reviewed:  CTneck, PET/CT    Assessment/Plan Cancer of tonsil, faucial (146.0) (C09.9). 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  Hydrocodone-Acetaminophen 5-325 MG Oral Tablet;1-2 po q4-6h prn pain; Qty40; R0; Rx. Amoxicillin 875 MG Oral Tablet;TAKE 1 TABLET EVERY 12 HOURS DAILY; MBB40; R4; Rx.  Jodi Marble 3/70/9643, 6:14 PM

## 2014-11-12 NOTE — Therapy (Signed)
[facility] [address] [place], [place], [address] Phone: [phone]   Fax:  [phone]  Speech Language Pathology Evaluation  Patient Details  Name: Kevin Mcclain MRN: [id number] Date of Birth: [birth year] Referring Provider:  Eppie Gibson, MD  Encounter Date: 11/12/2014      End of Session - 11/12/14 1414    Visit Number 1   Number of Visits 3   Date for SLP Re-Evaluation 01/11/15   SLP Start Time 9381   SLP Stop Time  1407   SLP Time Calculation (min) 45 min   Activity Tolerance Patient tolerated treatment well      Past Medical History  Diagnosis Date  . Depression   . Arthritis   . Diabetes mellitus type II   . Kidney stones   . CAD (coronary artery disease)     severe CAD-s/p MI 2000 with PTCA, s/p CABG 2005-by Dr Vanetta Mulders at [facility] EF 50-55%  . Hypertension   . Hyperlipidemia   . Gout   . Vertigo   . 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 Allergy   . Anxiety     Past Surgical History  Procedure Laterality Date  . Coronary artery bypass graft  2005    5 grafts  . Cholecystectomy    . Colonoscopy      There were no vitals filed for this visit.  Visit Diagnosis: Oropharyngeal dysphagia      Subjective Assessment - 11/12/14 1326    Subjective "I feel like there's something back there." (when I swallow)            SLP Evaluation [facility] - 11/12/14 1327    SLP Visit Information   SLP Received On 11/12/14   Medical Diagnosis tonsilar cancer   General Information   Other Pertinent Information Pt will have surgery Wednesday to remove bacterial sinus growth removed, as well as molars removed. Does not have definite start date for rad tx yet. "Suspicious" looking CT scan for lymph node involvement; original plan for rad tx only but unsure now - to be decided in next 2-3 days.     Oral Motor/Sensory Function   Overall Oral Motor/Sensory Function Appears  within functional limits for tasks assessed   Labial ROM Within Functional Limits   Labial Sensation Within Functional Limits   Labial Coordination Reduced   Lingual ROM Within Functional Limits   Lingual Strength Within Functional Limits   Lingual Sensation Within Functional Limits   Lingual Coordination WFL   Facial ROM Within Functional Limits   Facial Symmetry Within Functional Limits   Facial Strength Within Functional Limits   Facial Sensation Within Functional Limits   Velum Within Functional Limits   Motor Speech   Overall Motor Speech Appears within functional limits for tasks assessed     Pt has had feeling of something in rt nasopharynx with POs for approx 12 months, and mild odynophagia with evening meal, over last 2 months. Denies coughing during meals, and reports mild throat clearing during meals. Medication administration has been more difficult as pt cannot take all seven medications at once, now must piecemeal medications.   Upon visual inspection of oral cavity, pt's rt tonsilar tumor was visualized.  Pt currently tolerates regular diet/thin liquids. POs: Pt had dysphagia III solids and water via cup without overt s/s aspiration. Thyroid elevation appeared WNL, and swallows appeared timely. Oral residue noted as WNL. Pt's swallow deemed WNL at this time.  Because data states the risk for dysphagia during and after radiation treatment is high due to undergoing radiation tx, SLP taught pt about the possibility of reduced/limited ability for PO intake during rad tx. SLP encouraged pt to continue swallowing POs as far into rad tx as possible, even ingesting POs and/or completing HEP shortly after administration of pain meds.   SLP educated pt re: changes to swallowing musculature after rad tx, and why adherence to dysphagia HEP provided today and PO consumption was necessary to inhibit muscular disuse atrophy and to reduce muscle fibrosis following rad tx. Pt demonstrated  understanding of these things to SLP.    After evaluation tasks, SLP then developed a HEP for pt and pt was instructed how to perform exercises involving lingual, vocal, and pharyngeal strengthening. SLP performed each exercise and pt return demonstrated each exercise. SLP ensured pt performance was correct prior to moving on to next exercise. Pt was instructed to complete this program 2-3 times a day, 7 days/week until 60 days after their last rad tx, then x2-3 a week after that.                     SLP Education - Nov 13, 2014 1413    Education provided Yes   Education Details HEP, muscle fibrosis/atrophy, ramificaitons of muscle changes   Person(s) Educated Patient;Spouse   Methods Explanation;Demonstration;Verbal cues   Comprehension Verbalized understanding;Returned demonstration          SLP Short Term Goals - 2014-11-13 1422    SLP SHORT TERM GOAL #1   Title pt will perform HEP with rare min A   Time 1   Period --  visit   Status New   SLP SHORT TERM GOAL #2   Title pt will tell SLP food items to eat as POs become more challenging to ingest   Time 1   Period --  visit   Status New   SLP SHORT TERM GOAL #3   Title pt will tell SLP overt s/s aspiration PNA with rare min A   Time 1   Period --  visits   Status New          SLP Long Term Goals - 11/13/14 1423    SLP LONG TERM GOAL #1   Title pt will tell SLP 3 overt s/s of aspiration PNA with modified indpendence   Time 2   Period --  visits   Status New   SLP LONG TERM GOAL #2   Title pt will perform HEP with modified indpendence   Time 2   Period --  visits   Status New   SLP LONG TERM GOAL #3   Title pt will tell SLP why keeping a food journal can A pt returning to regular diet   Time 2   Period --  visits   Status New          Plan - November 13, 2014 1415    Clinical Impression Statement Pt presents today with swallowing without overt s/s aspiration. He wll benefit from f/u ST to assess  ability to complete HEP correctly, and to assess safety with POs during and after rad tx.   Speech Therapy Frequency --  Once approx every four weeks   Duration --  for 60 days   Treatment/Interventions Pharyngeal strengthening exercises;Oral motor exercises;Patient/family education;SLP instruction and feedback;Compensatory strategies;Cueing hierarchy   Potential to Achieve Goals Good          G-Codes - 11/13/2014 1425  Functional Assessment Tool Used noms   Functional Limitations Swallowing   Swallow Current Status [phone]) At least 1 percent but less than 20 percent impaired, limited or restricted   Swallow Goal Status ([id number]) At least 1 percent but less than 20 percent impaired, limited or restricted      Problem List Patient Active Problem List   Diagnosis Date Noted  . 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 Tonsil cancer 10/31/2014  . Preventative health care 01/19/2014  . Arthropathy 03/10/2013  . Numbness and tingling sensation of skin 05/03/2012  . Low back pain 11/26/2011  . Kidney stone 11/04/2011  . Osteoarthritis 11/18/2010  . CAD 10/11/2008  . DEGENERATIVE JOINT DISEASE, FINGERS 12/27/2007  . Gout 03/28/2007  . Type 2 diabetes mellitus with peripheral neuropathy 01/24/2007  . HYPERLIPIDEMIA 01/24/2007  . Essential hypertension 01/24/2007  . SHOULDER PAIN, LEFT 01/24/2007    [facility] , [place], [place]  11/12/2014, 2:25 PM  [facility] [address] [place], [place], [address] Phone: [phone]   Fax:  [phone]

## 2014-11-12 NOTE — Addendum Note (Signed)
Addended by: Garald Balding B on: 11/12/2014 02:31 PM   Modules accepted: Orders

## 2014-11-12 NOTE — Patient Instructions (Signed)
SWALLOWING EXERCISES Do these 6 of the 7 days per week until 6 monhts after your last day of radation, then 2-3 times per week afterwards  1. Effortful Swallows - Press your tongue hard against the roof of your mouth and squeeze hard with the muscles in your neck while you swallow your  saliva or a sip of water - Repeat 15-20 times, 2-3 times a day, and use whenever you eat or drink  2. Masako Swallow - swallow with your tongue sticking out - Stick tongue out past your teeth and gently bite tongue with your teeth - Swallow, while holding your tongue with your teeth - Repeat 15-20 times, 2-3 times a day *use a wet spoon if your mouth gets dry*  3. Pitch Raise - Repeat "he", once per second in as high of a pitch as you can - Repeat 20 times, 2-3 times a day  4. Mendelsohn Maneuver - "half swallow" exercise - Start to swallow, and keep your Adam's apple up by squeezing hard with the muscles of the throat - Hold the squeeze for 5-7 seconds and then relax - Repeat 20 times, 2-3 times a day *use a wet spoon if your mouth gets dry*  5. Breath Hold - Say "HUH!" loudly, then hold your breath for 3 seconds at your voice box - Repeat 20 times, 2-3 times a day  6. Chin pushback - Open your mouth  - Place your fist UNDER your chin near your neck, and push back with your fist for 5 seconds - Repeat 10 times, 2-3 times a day        7.  Towel roll press  -roll up a bath towel and place under your chin  - press down on the towel with your chin 30 times twice a day  - press down on the towel as above but hold the press for 45 seconds, twice a day

## 2014-11-13 ENCOUNTER — Encounter (HOSPITAL_COMMUNITY): Payer: Self-pay | Admitting: *Deleted

## 2014-11-13 NOTE — Progress Notes (Signed)
Kevin Mcclain has a history of CABG in 2005.  In 2012 patient has a laparoscopic cholecystectomy, that evening patient has hypoxic respiratory distress and acute diastolic CHF.  Patient was seen by Dr Verl Blalock and Dr Mahalia Longest in the hospital.  Kevin Mcclain has not seen a cardiologist since this episode and has not had an EKG.  Dr Lissa Hoard notified of history and lack of cardiology follow up.

## 2014-11-14 ENCOUNTER — Encounter (HOSPITAL_COMMUNITY)
Admission: RE | Disposition: A | Discharge status: Home / Self Care | Payer: Self-pay | Source: Ambulatory Visit | Attending: Otolaryngology

## 2014-11-14 ENCOUNTER — Ambulatory Visit (HOSPITAL_COMMUNITY)
Admission: RE | Admit: 2014-11-14 | Discharge: 2014-11-14 | Disposition: A | Discharge status: Home / Self Care | Payer: Medicare Other | Source: Ambulatory Visit | Attending: Otolaryngology | Admitting: Otolaryngology

## 2014-11-14 ENCOUNTER — Encounter (HOSPITAL_COMMUNITY): Payer: Self-pay | Admitting: Certified Registered Nurse Anesthetist

## 2014-11-14 ENCOUNTER — Ambulatory Visit: Payer: Medicare Other | Admitting: Physical Therapy

## 2014-11-14 ENCOUNTER — Ambulatory Visit (HOSPITAL_COMMUNITY): Payer: Medicare Other | Admitting: Certified Registered Nurse Anesthetist

## 2014-11-14 DIAGNOSIS — C099 Malignant neoplasm of tonsil, unspecified: Secondary | ICD-10-CM | POA: Diagnosis not present

## 2014-11-14 DIAGNOSIS — I10 Essential (primary) hypertension: Secondary | ICD-10-CM | POA: Insufficient documentation

## 2014-11-14 DIAGNOSIS — E785 Hyperlipidemia, unspecified: Secondary | ICD-10-CM | POA: Insufficient documentation

## 2014-11-14 DIAGNOSIS — K045 Chronic apical periodontitis: Secondary | ICD-10-CM

## 2014-11-14 DIAGNOSIS — E119 Type 2 diabetes mellitus without complications: Secondary | ICD-10-CM | POA: Insufficient documentation

## 2014-11-14 DIAGNOSIS — J329 Chronic sinusitis, unspecified: Secondary | ICD-10-CM | POA: Diagnosis present

## 2014-11-14 DIAGNOSIS — J320 Chronic maxillary sinusitis: Secondary | ICD-10-CM | POA: Diagnosis not present

## 2014-11-14 DIAGNOSIS — Z955 Presence of coronary angioplasty implant and graft: Secondary | ICD-10-CM | POA: Insufficient documentation

## 2014-11-14 DIAGNOSIS — K0530 Chronic periodontitis, unspecified: Secondary | ICD-10-CM

## 2014-11-14 DIAGNOSIS — K036 Deposits [accretions] on teeth: Secondary | ICD-10-CM

## 2014-11-14 DIAGNOSIS — I2510 Atherosclerotic heart disease of native coronary artery without angina pectoris: Secondary | ICD-10-CM | POA: Diagnosis not present

## 2014-11-14 HISTORY — PX: NASAL SINUS SURGERY: SHX719

## 2014-11-14 HISTORY — DX: Gastro-esophageal reflux disease without esophagitis: K21.9

## 2014-11-14 HISTORY — PX: MULTIPLE EXTRACTIONS WITH ALVEOLOPLASTY: SHX5342

## 2014-11-14 LAB — CBC
HEMATOCRIT: 42 % (ref 39.0–52.0)
HEMOGLOBIN: 14.7 g/dL (ref 13.0–17.0)
MCH: 31.3 pg (ref 26.0–34.0)
MCHC: 35 g/dL (ref 30.0–36.0)
MCV: 89.4 fL (ref 78.0–100.0)
PLATELETS: 187 10*3/uL (ref 150–400)
RBC: 4.7 MIL/uL (ref 4.22–5.81)
RDW: 13.3 % (ref 11.5–15.5)
WBC: 9 10*3/uL (ref 4.0–10.5)

## 2014-11-14 LAB — BASIC METABOLIC PANEL
ANION GAP: 11 (ref 5–15)
BUN: 13 mg/dL (ref 6–20)
CHLORIDE: 106 mmol/L (ref 101–111)
CO2: 21 mmol/L — AB (ref 22–32)
Calcium: 9.5 mg/dL (ref 8.9–10.3)
Creatinine, Ser: 0.93 mg/dL (ref 0.61–1.24)
GFR calc non Af Amer: >60 mL/min (ref >60)
Glucose, Bld: 131 mg/dL — ABNORMAL HIGH (ref 65–99)
POTASSIUM: 3.6 mmol/L (ref 3.5–5.1)
SODIUM: 138 mmol/L (ref 135–145)

## 2014-11-14 LAB — GLUCOSE, CAPILLARY
GLUCOSE-CAPILLARY: 178 mg/dL — AB (ref 65–99)
Glucose-Capillary: 133 mg/dL — ABNORMAL HIGH (ref 65–99)

## 2014-11-14 SURGERY — SINUS SURGERY, ENDOSCOPIC
Anesthesia: General | Site: Mouth | Laterality: Right

## 2014-11-14 MED ORDER — ACETAMINOPHEN 325 MG PO TABS: 325.0000 mg | ORAL_TABLET | ORAL | Status: DC | PRN

## 2014-11-14 MED ORDER — PROPOFOL 10 MG/ML IV BOLUS
INTRAVENOUS | Status: DC | PRN
  Administered 2014-11-14: 60 mg via INTRAVENOUS
  Administered 2014-11-14: 40 mg via INTRAVENOUS
  Administered 2014-11-14: 60 mg via INTRAVENOUS
  Administered 2014-11-14: 140 mg via INTRAVENOUS

## 2014-11-14 MED ORDER — NEOSTIGMINE METHYLSULFATE 10 MG/10ML IV SOLN
INTRAVENOUS | Status: AC
  Filled 2014-11-14: qty 1

## 2014-11-14 MED ORDER — ONDANSETRON HCL 4 MG/2ML IJ SOLN
INTRAMUSCULAR | Status: DC | PRN
  Administered 2014-11-14: 4 mg via INTRAVENOUS

## 2014-11-14 MED ORDER — LIDOCAINE-EPINEPHRINE 2 %-1:100000 IJ SOLN
INTRAMUSCULAR | Status: DC | PRN
  Administered 2014-11-14 (×4): 1.7 mL

## 2014-11-14 MED ORDER — MIDAZOLAM HCL 5 MG/5ML IJ SOLN
INTRAMUSCULAR | Status: DC | PRN
  Administered 2014-11-14: 2 mg via INTRAVENOUS

## 2014-11-14 MED ORDER — PROPOFOL 10 MG/ML IV BOLUS
INTRAVENOUS | Status: AC
  Filled 2014-11-14: qty 20

## 2014-11-14 MED ORDER — OXYMETAZOLINE HCL 0.05 % NA SOLN
NASAL | Status: AC
  Filled 2014-11-14: qty 15

## 2014-11-14 MED ORDER — 0.9 % SODIUM CHLORIDE (POUR BTL) OPTIME
TOPICAL | Status: DC | PRN
  Administered 2014-11-14: 1000 mL

## 2014-11-14 MED ORDER — ROCURONIUM BROMIDE 50 MG/5ML IV SOLN
INTRAVENOUS | Status: AC
  Filled 2014-11-14: qty 1

## 2014-11-14 MED ORDER — GLYCOPYRROLATE 0.2 MG/ML IJ SOLN
INTRAMUSCULAR | Status: AC
  Filled 2014-11-14: qty 3

## 2014-11-14 MED ORDER — OXYCODONE HCL 5 MG PO TABS: 5.0000 mg | ORAL_TABLET | Freq: Once | ORAL | Status: AC | PRN

## 2014-11-14 MED ORDER — BUPIVACAINE-EPINEPHRINE 0.5% -1:200000 IJ SOLN
INTRAMUSCULAR | Status: DC | PRN
  Administered 2014-11-14 (×2): 1.8 mL

## 2014-11-14 MED ORDER — PHENYLEPHRINE HCL 10 MG/ML IJ SOLN
INTRAMUSCULAR | Status: AC
  Filled 2014-11-14: qty 1

## 2014-11-14 MED ORDER — OXYMETAZOLINE HCL 0.05 % NA SOLN
2.0000 | NASAL | Status: AC
  Filled 2014-11-14: qty 15

## 2014-11-14 MED ORDER — LIDOCAINE HCL (CARDIAC) 20 MG/ML IV SOLN
INTRAVENOUS | Status: AC
  Filled 2014-11-14: qty 5

## 2014-11-14 MED ORDER — LIDOCAINE-EPINEPHRINE 1 %-1:100000 IJ SOLN
INTRAMUSCULAR | Status: DC | PRN
  Administered 2014-11-14: 14 mL

## 2014-11-14 MED ORDER — OXYMETAZOLINE HCL 0.05 % NA SOLN
NASAL | Status: DC | PRN
  Administered 2014-11-14: 2 via TOPICAL

## 2014-11-14 MED ORDER — ONDANSETRON HCL 4 MG/2ML IJ SOLN
INTRAMUSCULAR | Status: AC
  Filled 2014-11-14: qty 2

## 2014-11-14 MED ORDER — PHENYLEPHRINE HCL 10 MG/ML IJ SOLN
10.0000 mg | INTRAVENOUS | Status: DC | PRN
  Administered 2014-11-14: 10 ug/min via INTRAVENOUS

## 2014-11-14 MED ORDER — OXYCODONE HCL 5 MG/5ML PO SOLN
5.0000 mg | Freq: Once | ORAL | Status: AC | PRN
  Administered 2014-11-14: 5 mg via ORAL

## 2014-11-14 MED ORDER — TRIPLE ANTIBIOTIC 5-400-5000 EX OINT
TOPICAL_OINTMENT | CUTANEOUS | Status: DC
  Filled 2014-11-14: qty 14

## 2014-11-14 MED ORDER — PHENYLEPHRINE HCL 10 MG/ML IJ SOLN
INTRAMUSCULAR | Status: DC | PRN
  Administered 2014-11-14: 40 ug via INTRAVENOUS
  Administered 2014-11-14: 80 ug via INTRAVENOUS

## 2014-11-14 MED ORDER — GLYCOPYRROLATE 0.2 MG/ML IJ SOLN
INTRAMUSCULAR | Status: DC | PRN
  Administered 2014-11-14: 0.6 mg via INTRAVENOUS

## 2014-11-14 MED ORDER — SODIUM CHLORIDE 0.9 % IR SOLN
Status: DC | PRN
  Administered 2014-11-14 (×2): 1000 mL

## 2014-11-14 MED ORDER — FENTANYL CITRATE (PF) 250 MCG/5ML IJ SOLN
INTRAMUSCULAR | Status: AC
  Filled 2014-11-14: qty 5

## 2014-11-14 MED ORDER — LIDOCAINE-EPINEPHRINE 2 %-1:100000 IJ SOLN
INTRAMUSCULAR | Status: AC
  Filled 2014-11-14: qty 10.2

## 2014-11-14 MED ORDER — NEOSTIGMINE METHYLSULFATE 10 MG/10ML IV SOLN
INTRAVENOUS | Status: DC | PRN
  Administered 2014-11-14: 5 mg via INTRAVENOUS

## 2014-11-14 MED ORDER — BUPIVACAINE-EPINEPHRINE (PF) 0.5% -1:200000 IJ SOLN
INTRAMUSCULAR | Status: AC
  Filled 2014-11-14: qty 3.6

## 2014-11-14 MED ORDER — ACETAMINOPHEN 160 MG/5ML PO SOLN
325.0000 mg | ORAL | Status: DC | PRN
  Filled 2014-11-14: qty 20.3

## 2014-11-14 MED ORDER — MIDAZOLAM HCL 2 MG/2ML IJ SOLN
INTRAMUSCULAR | Status: AC
  Filled 2014-11-14: qty 4

## 2014-11-14 MED ORDER — FENTANYL CITRATE (PF) 100 MCG/2ML IJ SOLN
INTRAMUSCULAR | Status: AC
  Administered 2014-11-14: 50 ug via INTRAVENOUS
  Filled 2014-11-14: qty 2

## 2014-11-14 MED ORDER — LACTATED RINGERS IV SOLN
INTRAVENOUS | Status: DC | PRN
  Administered 2014-11-14 (×2): via INTRAVENOUS

## 2014-11-14 MED ORDER — ROCURONIUM BROMIDE 100 MG/10ML IV SOLN
INTRAVENOUS | Status: DC | PRN
  Administered 2014-11-14: 40 mg via INTRAVENOUS
  Administered 2014-11-14: 10 mg via INTRAVENOUS

## 2014-11-14 MED ORDER — FENTANYL CITRATE (PF) 100 MCG/2ML IJ SOLN
25.0000 ug | INTRAMUSCULAR | Status: DC | PRN
  Administered 2014-11-14 (×2): 50 ug via INTRAVENOUS

## 2014-11-14 MED ORDER — OXYCODONE HCL 5 MG/5ML PO SOLN
ORAL | Status: AC
  Filled 2014-11-14: qty 5

## 2014-11-14 MED ORDER — LIDOCAINE-EPINEPHRINE 1 %-1:100000 IJ SOLN
INTRAMUSCULAR | Status: AC
  Filled 2014-11-14: qty 1

## 2014-11-14 MED ORDER — CEFAZOLIN SODIUM-DEXTROSE 2-3 GM-% IV SOLR
2.0000 g | INTRAVENOUS | Status: AC
  Administered 2014-11-14: 2 g via INTRAVENOUS
  Filled 2014-11-14: qty 50

## 2014-11-14 MED ORDER — HEMOSTATIC AGENTS (NO CHARGE) OPTIME
TOPICAL | Status: DC | PRN
  Administered 2014-11-14 (×2): 1 via TOPICAL

## 2014-11-14 MED ORDER — FENTANYL CITRATE (PF) 100 MCG/2ML IJ SOLN
INTRAMUSCULAR | Status: DC | PRN
  Administered 2014-11-14: 100 ug via INTRAVENOUS
  Administered 2014-11-14: 50 ug via INTRAVENOUS
  Administered 2014-11-14: 100 ug via INTRAVENOUS

## 2014-11-14 MED ORDER — BACITRACIN-NEOMYCIN-POLYMYXIN 400-5-5000 EX OINT
TOPICAL_OINTMENT | CUTANEOUS | Status: DC | PRN
  Administered 2014-11-14: 1 via TOPICAL

## 2014-11-14 MED ORDER — LIDOCAINE HCL (CARDIAC) 20 MG/ML IV SOLN
INTRAVENOUS | Status: DC | PRN
  Administered 2014-11-14: 60 mg via INTRAVENOUS

## 2014-11-14 SURGICAL SUPPLY — 66 items
ALCOHOL 70% 16 OZ (MISCELLANEOUS) ×3 IMPLANT
ATTRACTOMAT 16X20 MAGNETIC DRP (DRAPES) ×3 IMPLANT
BALL CTTN LRG ABS STRL LF (GAUZE/BANDAGES/DRESSINGS) ×1
BLADE RAD40 ROTATE 4M 4 5PK (BLADE) IMPLANT
BLADE RAD60 ROTATE M4 4 5PK (BLADE) IMPLANT
BLADE SURG 15 STRL LF DISP TIS (BLADE) ×4 IMPLANT
BLADE SURG 15 STRL SS (BLADE) ×4
BLADE TRICUT ROTATE M4 4 5PK (BLADE) ×3 IMPLANT
CANISTER SUCTION 2500CC (MISCELLANEOUS) ×6 IMPLANT
COAGULATOR SUCT SWTCH 10FR 6 (ELECTROSURGICAL) IMPLANT
COTTONBALL LRG STERILE PKG (GAUZE/BANDAGES/DRESSINGS) ×3 IMPLANT
COVER SURGICAL LIGHT HANDLE (MISCELLANEOUS) ×3 IMPLANT
CRADLE DONUT ADULT HEAD (MISCELLANEOUS) IMPLANT
DECANTER SPIKE VIAL GLASS SM (MISCELLANEOUS) ×3 IMPLANT
DRAPE ORTHO SPLIT 77X108 STRL (DRAPES) ×3
DRAPE PROXIMA HALF (DRAPES) IMPLANT
DRAPE SURG ORHT 6 SPLT 77X108 (DRAPES) ×2 IMPLANT
DRESSING TELFA 8X3 (GAUZE/BANDAGES/DRESSINGS) ×3 IMPLANT
DRSG NASOPORE 8CM (GAUZE/BANDAGES/DRESSINGS) ×3 IMPLANT
ELECT REM PT RETURN 9FT ADLT (ELECTROSURGICAL) ×3
ELECTRODE REM PT RTRN 9FT ADLT (ELECTROSURGICAL) ×2 IMPLANT
FILTER ARTHROSCOPY CONVERTOR (FILTER) ×3 IMPLANT
GAUZE PACKING FOLDED 2  STR (GAUZE/BANDAGES/DRESSINGS) ×2
GAUZE PACKING FOLDED 2 STR (GAUZE/BANDAGES/DRESSINGS) ×4 IMPLANT
GAUZE SPONGE 4X4 16PLY XRAY LF (GAUZE/BANDAGES/DRESSINGS) ×3 IMPLANT
GLOVE BIOGEL PI IND STRL 6 (GLOVE) ×2 IMPLANT
GLOVE BIOGEL PI INDICATOR 6 (GLOVE) ×1
GLOVE ECLIPSE 8.0 STRL XLNG CF (GLOVE) ×6 IMPLANT
GLOVE SURG ORTHO 8.0 STRL STRW (GLOVE) ×3 IMPLANT
GLOVE SURG SS PI 6.0 STRL IVOR (GLOVE) ×3 IMPLANT
GOWN STRL REUS W/ TWL LRG LVL3 (GOWN DISPOSABLE) ×2 IMPLANT
GOWN STRL REUS W/ TWL XL LVL3 (GOWN DISPOSABLE) ×2 IMPLANT
GOWN STRL REUS W/TWL 2XL LVL3 (GOWN DISPOSABLE) ×3 IMPLANT
GOWN STRL REUS W/TWL LRG LVL3 (GOWN DISPOSABLE) ×3
GOWN STRL REUS W/TWL XL LVL3 (GOWN DISPOSABLE) ×3
HEMOSTAT SURGICEL 2X14 (HEMOSTASIS) ×3 IMPLANT
KIT BASIN OR (CUSTOM PROCEDURE TRAY) ×6 IMPLANT
KIT ROOM TURNOVER OR (KITS) ×3 IMPLANT
MANIFOLD NEPTUNE WASTE (CANNULA) ×3 IMPLANT
NEEDLE BLUNT 16X1.5 OR ONLY (NEEDLE) ×3 IMPLANT
NEEDLE HYPO 25GX1X1/2 BEV (NEEDLE) ×3 IMPLANT
NEEDLE SPNL 22GX3.5 QUINCKE BK (NEEDLE) ×3 IMPLANT
NEEDLE SPNL 25GX3.5 QUINCKE BL (NEEDLE) ×3 IMPLANT
NS IRRIG 1000ML POUR BTL (IV SOLUTION) ×6 IMPLANT
PACK EENT II TURBAN DRAPE (CUSTOM PROCEDURE TRAY) ×3 IMPLANT
PAD ARMBOARD 7.5X6 YLW CONV (MISCELLANEOUS) ×6 IMPLANT
PATTIES SURGICAL .5 X3 (DISPOSABLE) ×3 IMPLANT
PENCIL BUTTON HOLSTER BLD 10FT (ELECTRODE) IMPLANT
SHEATH ENDOSCRUB 0 DEG (SHEATH) ×3 IMPLANT
SHEATH ENDOSCRUB 30 DEG (SHEATH) IMPLANT
SHEATH ENDOSCRUB 45 DEG (SHEATH) IMPLANT
SOLUTION ANTI FOG 6CC (MISCELLANEOUS) ×3 IMPLANT
SPECIMEN JAR SMALL (MISCELLANEOUS) ×3 IMPLANT
SPONGE SURGIFOAM ABS GEL 100 (HEMOSTASIS) IMPLANT
SPONGE SURGIFOAM ABS GEL 12-7 (HEMOSTASIS) IMPLANT
SPONGE SURGIFOAM ABS GEL SZ50 (HEMOSTASIS) IMPLANT
SUCTION FRAZIER TIP 10 FR DISP (SUCTIONS) ×3 IMPLANT
SUT CHROMIC 3 0 PS 2 (SUTURE) ×6 IMPLANT
SUT SILK 2 0 FS (SUTURE) IMPLANT
SYR 50ML SLIP (SYRINGE) ×3 IMPLANT
TOWEL OR 17X24 6PK STRL BLUE (TOWEL DISPOSABLE) ×3 IMPLANT
TOWEL OR 17X26 10 PK STRL BLUE (TOWEL DISPOSABLE) IMPLANT
TRAY ENT MC OR (CUSTOM PROCEDURE TRAY) ×3 IMPLANT
TUBE CONNECTING 12X1/4 (SUCTIONS) ×6 IMPLANT
TUBING STRAIGHTSHOT EPS 5PK (TUBING) ×3 IMPLANT
YANKAUER SUCT BULB TIP NO VENT (SUCTIONS) ×3 IMPLANT

## 2014-11-14 NOTE — Progress Notes (Signed)
PRE-OPERATIVE NOTE:  11/14/2014   Kevin Mcclain [id number]  VITALS: BP 130/61 mmHg  Pulse 71  Temp(Src) 97.6 F (36.4 C) (Oral)  Resp 18  Ht 5\' 3"  (1.6 m)  Wt 155 lb 5 oz (70.449 kg)  BMI 27.52 kg/m2  SpO2 98%  Lab Results  Component Value Date   WBC 9.0 11/14/2014   HGB 14.7 11/14/2014   HCT 42.0 11/14/2014   MCV 89.4 11/14/2014   PLT 187 11/14/2014   BMET    Component Value Date/Time   NA 138 11/14/2014 0708   K 3.6 11/14/2014 0708   CL 106 11/14/2014 0708   CO2 21* 11/14/2014 0708   GLUCOSE 131* 11/14/2014 0708   BUN 13 11/14/2014 0708   CREATININE 0.93 11/14/2014 0708   CREATININE 0.81 09/19/2010 1339   CALCIUM 9.5 11/14/2014 0708   GFRNONAA >60 11/14/2014 0708   GFRAA >60 11/14/2014 0708    No results found for: INR, PROTIME No results found for: PTT   Kevin Mcclain presents for sinus procedure with Dr. Erik Obey followed by multiple dental extractions with alveoloplasty, sectioning or bridge, and gross debridement of remaining dentition the operating room with general anesthesia.    SUBJECTIVE: The patient denies any acute medical or dental changes and agrees to proceed with treatment as planned.  EXAM: No sign of acute dental changes.  ASSESSMENT: Patient is affected by chronic apical periodontitis, chronic periodontitis, accretions.  PLAN: Patient agrees to proceed with treatment as planned in the operating room as previously discussed and accepts the risks, benefits, and complications of the proposed treatment. Patient is aware of the risk for bleeding, bruising, swelling, infection, pain, nerve damage, soft tissue damage, damage to adjacent teeth, sinus involvement, root tip fracture, mandible fracture, and the risks of complications associated with the anesthesia. Patient also is aware of the potential for other complications not mentioned above.   Lenn Cal, DDS

## 2014-11-14 NOTE — Anesthesia Preprocedure Evaluation (Signed)
Anesthesia Evaluation  Patient identified by MRN, date of birth, ID band Patient awake    Reviewed: Allergy & Precautions, NPO status , Patient's Chart, lab work & pertinent test results  History of Anesthesia Complications Negative for: history of anesthetic complications  Airway Mallampati: II  TM Distance: >3 FB Neck ROM: Full    Dental  (+) Teeth Intact   Pulmonary neg pulmonary ROS,    breath sounds clear to auscultation       Cardiovascular hypertension, Pt. on medications and Pt. on home beta blockers + CAD and + CABG  (-) CHF (-) dysrhythmias  Rhythm:Regular     Neuro/Psych PSYCHIATRIC DISORDERS Anxiety Depression  Neuromuscular disease    GI/Hepatic Neg liver ROS, GERD  Medicated and Controlled,  Endo/Other  diabetes, Type 2  Renal/GU negative Renal ROS     Musculoskeletal  (+) Arthritis ,   Abdominal   Peds  Hematology negative hematology ROS (+)   Anesthesia Other Findings   Reproductive/Obstetrics                             Anesthesia Physical Anesthesia Plan  ASA: III  Anesthesia Plan: General   Post-op Pain Management:    Induction: Intravenous  Airway Management Planned: Oral ETT  Additional Equipment: None  Intra-op Plan:   Post-operative Plan: Extubation in OR  Informed Consent: I have reviewed the patients History and Physical, chart, labs and discussed the procedure including the risks, benefits and alternatives for the proposed anesthesia with the patient or authorized representative who has indicated his/her understanding and acceptance.   Dental advisory given  Plan Discussed with: CRNA and Surgeon  Anesthesia Plan Comments:         Anesthesia Quick Evaluation

## 2014-11-14 NOTE — H&P (View-Only) (Signed)
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 Redmond Baseman 6 or 7 years ago with concerns about sinusitis.  He still has occasional sinus drainage which is sometimes odorous.     He has known reflux for which she takes occasional Zantac in the evening.  He recalls try Nexium at some point in the past but not steadily and does not recall the results.   For one month, he has had a sense of something in the RIGHT side of his throat.  No pain.  No bleeding.  No difficulty breathing, speaking, or swallowing.  He does not smoke or drink significantly.  No neck masses.  No history of cancer.  No change in weight, appetite, or energy.  No fevers or night sweats.  His family doctor earlier this week saw something and he is here for further evaluation.  The RIGHT tonsil biopsy shows SCCa, P16+.  I will call and share this with the patient.  I will plan a PET scan and may also need a CT neck.    PET scan dated August 29 shows activity in the known RIGHT tonsil cancer.  Nothing to suggest metastatic adenopathy.  There is some peripheral enhancing activity in the RIGHT maxillary sinus.  Reviewing the CT scan, he has bony osteitic thickening, and complete opacification with heterogeneous radio density in the RIGHT maxillary sinus consistent with a chronic "fungus ball".    this will likely need a surgical approach at a  later time.  I will call and discuss this with him.  I called and discussed the PET scan results with the patient and his wife. the RIGHT tonsil is positive as expected, but no evidence of lymph nodes.he is scheduled to see Dr. Isidore Moos, Dr. Alvy Bimler, Dr. Julieta Bellini have a CT scan of the neck in the near future.  treatment will likely be a combination of chemotherapy and radiation.  Dr. Constance Holster will present his case at tumor board next week and we will call with final conclusions.  He is aware of  RIGHT facial pressure and foul-smelling drainage from his RIGHT nose ongoing at least 8 months.  This would correspond with the maxillary "fungus ball" identified on the PET scan.  It is possible Dr. Alvy Bimler orDr. Isidore Moos would prefer not to give treatment prior to surgical resolution for this.  this could be accomplished in relatively short order.  This is unlikely to respond to antibiotics alone.    One month return visit.  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  He has seen Dr. Isidore Moos and Dr. Alvy Bimler.  He has seen Dr. Lawana Chambers and needs several teeth extracted.  A PET scan did demonstrate a lesion of the RIGHT maxillary sinus consistent with a "fungus ball" which is a variation on chronic maxillary sinusitis.  Dr. Isidore Moos would prefer her that we remove this potential source of infectious complication from the radiation.  We will coordinate this with Dr. Ritta Slot extractions.   I discussed the proposed procedure, namely RIGHT maxillary endoscopic antrostomy and anterior ethmoidectomy with evacuation of RIGHT maxillary sinus contents.  Risks and complications were discussed.  Questions were answered and informed consent was obtained.  I discussed nasal hygiene measures.  I gave him a prescription for hydrocodone for pain relief and amoxicillin antibiotics.  I discussed advancement of diet and  activity afterwards.  I will see him here 3 weeks after surgery  PMH: Past Medical History  Diagnosis Date  . Depression   . Arthritis   . Diabetes mellitus type II   . Kidney stones   . CAD (coronary artery disease)     severe CAD-s/p MI 2000 with PTCA, s/p CABG 2005-by Dr Vanetta Mulders at [facility] EF 50-55%  . Hypertension   . Hyperlipidemia   . Gout   . Vertigo   . 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 Allergy   . Anxiety     Surg Hx: Past Surgical History  Procedure Laterality Date  . Coronary  artery bypass graft  2005    5 grafts  . Cholecystectomy    . Colonoscopy      FHx:   Family History  Problem Relation Age of Onset  . Heart attack Mother     deceased age 46 secondary  . Heart disease Mother   . Colon cancer Father   . Stroke Father     deceased age 92  . Heart disease Father   . Cancer Father     colon  . Stroke Sister 54  . Heart disease Sister   . Coronary artery disease Brother     s/p bypass surgery  . Heart disease Brother   . Heart disease Brother    SocHx:  reports that he has never smoked. He has never used smokeless tobacco. He reports that he does not drink alcohol or use illicit drugs.  ALLERGIES:  Allergies  Allergen Reactions  . Benzonatate Swelling    "swells my throat"  . Codeine Itching  . Sitagliptin Phosphate Itching     (Not in a hospital admission)  No results found for this or any previous visit (from the past 48 hour(s)). No results found.  ROS:  Systemic: Not feeling tired (fatigue).  No fever, no night sweats, and no recent weight loss. Head: Headache. Eyes: No eye symptoms. Otolaryngeal: No hearing loss, no earache, no tinnitus, and no purulent nasal discharge.  Nasal passage blockage (stuffiness).  No snoring, no sneezing, no hoarseness, and no sore throat. Cardiovascular: No chest pain or discomfort  and no palpitations. Pulmonary: No dyspnea, no cough, and no wheezing. Gastrointestinal: No dysphagia  and no heartburn.  No nausea, no abdominal pain, and no melena.  No diarrhea. Genitourinary: No dysuria. Endocrine: No muscle weakness. Musculoskeletal: Calf muscle cramps.  No arthralgias  and no soft tissue swelling. Neurological: No dizziness, no fainting, no tingling, and no numbness. Psychological: No anxiety  and no depression. Skin: No rash.  BP:129/59,  HR: 72 b/min,  BSA Calculated: 1.72 ,  BMI Calculated: 26.93 ,  Weight: 152 lb , BMI: 26.9 kg/m2,  Height: 5 ft 3 in  PHYSICAL EXAM: He is trim and  healthy.  Mental status is appropriate.  He hears well and conversational speech.  Voice is clear and respirations unlabored through the nose.  The head is atraumatic and neck is supple.  Ear canals are clear with scarring of the RIGHT tympanic membrane and a normal LEFT drum.  Anterior nose is moist and patent.  Oral cavity is clear with teeth in good repair.  Oropharynx reveals a 2.5 cm erythematous mass occupying the RIGHT tonsil.  Neck without adenopathy.   Using the flexible laryngoscope, the nasopharynx is clear.  Oropharynx shows the RIGHT tonsillar mass from above but no other lesions.  Hypopharynx and larynx are  clear.  He is stocky and healthy.  Anterior nose shows a rightward septal deviation with no obvious polyps or lesions.  Oral cavity again shows an enlarged erythematous RIGHT tonsil although not much different than one month ago.  No palpable neck adenopathy.   Lungs: Clear to auscultation Heart: Regular rate and rhythm without murmurs Abdomen: Soft, active Extremities: Normal configuration Neurologic: Symmetric, grossly intact.    Studies Reviewed:  CTneck, PET/CT    Assessment/Plan Cancer of tonsil, faucial (146.0) (C09.9). 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  Hydrocodone-Acetaminophen 5-325 MG Oral Tablet;1-2 po q4-6h prn pain; Qty40; R0; Rx. Amoxicillin 875 MG Oral Tablet;TAKE 1 TABLET EVERY 12 HOURS DAILY; SKS13; R4; Rx.  Jodi Marble 8/87/1959, 6:14 PM

## 2014-11-14 NOTE — Discharge Instructions (Signed)
Keep head elevated x 3-4 nights Drip pad and change as needed Rinse throat with cool dilute salt water as often as desired. Begin nasal hygiene measures according to our office instructions later today Call for bleeding or excessive pain, [phone] Recheck my office 3 weeks.  MOUTH CARE AFTER SURGERY  FACTS:  Ice used in ice bag helps keep the swelling down, and can help lessen the pain.  It is easier to treat pain BEFORE it happens.  Spitting disturbs the clot and may cause bleeding to start again, or to get worse.  Smoking delays healing and can cause complications.  Sharing prescriptions can be dangerous.  Do not take medications not recently prescribed for you.  Antibiotics may stop birth control pills from working.  Use other means of birth control while on antibiotics.  Warm salt water rinses after the first 24 hours will help lessen the swelling:  Use 1/2 teaspoonful of table salt per oz.of water.  DO NOT:  Do not spit.  Do not drink through a straw.  Strongly advised not to smoke, dip snuff or chew tobacco at least for 3 days.  Do not eat sharp or crunchy foods.  Avoid the area of surgery when chewing.  Do not stop your antibiotics before your instructions say to do so.  Do not eat hot foods until bleeding has stopped.  If you need to, let your food cool down to room temperature.  EXPECT:  Some swelling, especially first 2-3 days.  Soreness or discomfort in varying degrees.  Follow your dentist's instructions about how to handle pain before it starts.  Pinkish saliva or light blood in saliva, or on your pillow in the morning.  This can last around 24 hours.  Bruising inside or outside the mouth.  This may not show up until 2-3 days after surgery.  Don't worry, it will go away in time.  Pieces of "bone" may work themselves loose.  It's OK.  If they bother you, let us know.  WHAT TO DO IMMEDIATELY AFTER SURGERY:  Bite on the gauze with steady pressure for 1-2  hours.  Don't chew on the gauze.  Do not lie down flat.  Raise your head support especially for the first 24 hours.  Apply ice to your face on the side of the surgery.  You may apply it 20 minutes on and a few minutes off.  Ice for 8-12 hours.  You may use ice up to 24 hours.  Before the numbness wears off, take a pain pill as instructed.  Prescription pain medication is not always required.  SWELLING:  Expect swelling for the first couple of days.  It should get better after that.  If swelling increases 3 days or so after surgery; let us know as soon as possible.  FEVER:  Take Tylenol every 4 hours if needed to lower your temperature, especially if it is at 100F or higher.  Drink lots of fluids.  If the fever does not go away, let us know.  BREATHING TROUBLE:  Any unusual difficulty breathing means you have to have someone bring you to the emergency room ASAP  BLEEDING:  Light oozing is expected for 24 hours or so.  Prop head up with pillows  Avoid spitting  Do not confuse bright red fresh flowing blood with lots of saliva colored with a little bit of blood.  If you notice some bleeding, place gauze or a tea bag where it is bleeding and apply CONSTANT pressure by  biting down for 1 hour.  Avoid talking during this time.  Do not remove the gauze or tea bag during this hour to "check" the bleeding.  If you notice bright RED bleeding FLOWING out of particular area, and filling the floor of your mouth, put a wad of gauze on that area, bite down firmly and constantly.  Call us immediately.  If we're closed, have someone bring you to the emergency room.  ORAL HYGIENE:  Brush your teeth as usual after meals and before bedtime.  Use a soft toothbrush around the area of surgery.  DO NOT AVOID BRUSHING.  Otherwise bacteria(germs) will grow and may delay healing or encourage infection.  Since you cannot spit, just gently rinse and let the water flow out of your mouth.  DO NOT  SWISH HARD.  EATING:  Cool liquids are a good point to start.  Increase to soft foods as tolerated.  PRESCRIPTIONS:  Follow the directions for your prescriptions exactly as written.  If Dr. Enrique Sack gave you a narcotic pain medication, do not drive, operate machinery or drink alcohol when on that medication.  QUESTIONS:  Call our office during office hours [phone] or call the Emergency Room at [phone].   SINUS PRECAUTIONS AFTER ORAL SURGERY  AVOID   Blowing your nose  It is best to wipe away nasal secretions carefully. After 2 weeks, if you must blow  your nose, blow gently through both sides at the same time. Do not pinch your  nose; do not blow just one side at a time.   Sneezing  If you must sneeze, keep your mouth open and do not pinch your nose closed.   Sucking  Do not drink through a straw. Do not smoke.    Blowing  Do not play a wind instrument. Do not blow a balloons.   Pushing or lifting  Do not lift or push objects weighing more than 20 pounds.   Bending over  Keeping her head above the level of your heart. Sleep with your head slightly raised.   Notify your dentist if you bleed from your nose. If you see bleeding from your nose, have neck stiffness, or increased sensitivity to bright light or severe headache, call Dental Medicine or proceed to the Emergency Department for evaluation immediately.  Notify your dentist if you are unable to take any of your medications as prescribed. You may be advised to take an antibiotic and decongestant as well as your regular pain medication. You must take these medications as prescribed. Do not stop taking them on her own. If you have a problem with any medication, please call us so that we can make an adjustment for you.  Contacts: 1. Dental Medicine: [phone] 4. [facility]: [phone]

## 2014-11-14 NOTE — Transfer of Care (Signed)
Immediate Anesthesia Transfer of Care Note  Patient: Kevin Mcclain  Procedure(s) Performed: Procedure(s): RIGHT ENDOSCOPIC ANTROSTOMY AND ANTERIOR ETHMOIDECTOMY  (Right) Extraction of tooth #2 and 31 with alveoloplasty after sectioning of bridge at distal #29 with full mouth debridement of remaining dention (N/A)  Patient Location: PACU  Anesthesia Type:General  Level of Consciousness: awake, alert , oriented, patient cooperative and responds to stimulation  Airway & Oxygen Therapy: Patient Spontanous Breathing and Patient connected to face mask oxygen  Post-op Assessment: Report given to RN, Post -op Vital signs reviewed and stable, Patient moving all extremities X 4 and Patient able to stick tongue midline  Post vital signs: Reviewed and stable  Last Vitals:  Filed Vitals:   11/14/14 1209  BP:   Pulse:   Temp: 36.5 C  Resp:     Complications: No apparent anesthesia complications

## 2014-11-14 NOTE — Op Note (Signed)
11/14/2014  10:38 AM    Ether Griffins  [id number]   Pre-Op Dx:  Chronic right maxillary sinus "fungus ball"  Post-op Dx: Same  Proc: Right endoscopic antrostomy with evacuation of sinus contents, right endoscopic anterior ethmoidectomy   Surg:  Jodi Marble T MD  Anes:  GOT  EBL:  25 mL  Comp:  None  Findings:  Rightward septal deviation. Mouth daily bulky right middle turbinate. Polypoid changes and inflamed mucosa in the middle meatus and right maxillary sinus. Pasty variegated brown debris filling the right maxillary sinus  Procedure: The patient did not receive ordered oxymetazoline spray preoperative owing to Epic EHR error.  In a comfortable supine position in the operating room, general orotracheal anesthesia was induced without difficulty.  At an appropriate level, the patient was placed in a slight sitting position. The nose was examined with the findings as described above. A saline moistened throat pack was placed. Nasal vibrissae were trimmed. Afrin solution was applied on 0.5 x 3" cotton pledgets to both sides of the septal mucosa. 1% Xylocaine with 1 100,000 epinephrine, 6 mL total was infiltrated into the anterior floor of the nose, into the anterior pole of the right inferior turbinate, and into the submucoperichondrial plane of the right septum. Several minutes were allowed for this to take effect. A clean preparation and draping of the midface was accomplished in the standard fashion.  The materials were removed from the nose and observed to be intact and correct in number.  Additional 1% Xylocaine with 1 100,000 epinephrine was infiltrated into the right middle turbinate and into the  lateral wall of the middle meatus, 8 mL's total using a 22-gauge spinal needle. The middle turbinate was medialized and an Afrin moistened pledget was placed in the middle meatus and another one between the turbinate and the septum.  Several additional minutes were allowed for  this to take effect.  Again the materials were removed from the nose and observed to be intact and correct in number.  Using a 0 nasal endoscope, the middle meatus was inspected. The uncinate process was lysed with a sickle knife and removed with a punch forceps. Polypoid and inflammatory mucosa was removed with the punch forceps. A curved suction tip was used to enter the antrum. This opening was enlarged posteriorly and anteriorly using a Gruenwald forceps and a backbiting forceps. The bulla ethmoidalis was opened and the walls were debrided with a punch forceps. There was moderate oozing owing to the inflammatory process, the lack of Afrin, and the patient's recent aspirin therapy.  The right maxillary sinus was irrigated and copious pasty variegated colored debris was noted. The power debrider was brought in and the polypoid and inflammatory mucosa in the middle meatus was debrided and the fungus ball contents as well. Partitions of the anterior ethmoid/maxillary sinus junction were reduced.  The 30 endoscope was brought into the field and with inspection, no residual fungus ball debris was noted. This point the procedure was completed. A piece of nasapore packing was shortened and placed into the middle meatus.  Hemostasis was observed. The nasopharynx and oropharynx were evacuated. The throat pack was left in place for Dr. Enrique Sack to continue.  The procedure was turned over to Dr. Enrique Sack for extractions at this point.  Dispo:    PACU to home following Dr. Ritta Slot extractions.  Plan:  Ice, elevation, analgesia, antibiosis, drip pad, nasal hygiene measures, advancement of diet and activity. Recheck my office 3 weeks.  Jodi Marble T  MD

## 2014-11-14 NOTE — Interval H&P Note (Signed)
History and Physical Interval Note:  11/14/2014 8:37 AM  Kevin Mcclain  has presented today for surgery, with the diagnosis of RIGHT MAXILLARY FUNGUS BALL CHRONIC SINUSITIS   The various methods of treatment have been discussed with the patient and family. After consideration of risks, benefits and other options for treatment, the patient has consented to  Procedure(s): RIGHT ENDOSCOPIC ANTROSTOMY AND ANTERIOR ETHMOIDECTOMY  (Right) MULTIPLE EXTRACTIONS WITH ALVEOLOPLASTY AND GROSS DEBRIDEMENT OF TEETH  (N/A) as a surgical intervention .  The patient's history has been re-reviewed, patient re-examined, no change in status, stable for surgery.  I have re-reviewed the patient's chart and labs.  Questions were answered to the patient's satisfaction.     Jodi Marble

## 2014-11-14 NOTE — Anesthesia Postprocedure Evaluation (Signed)
  Anesthesia Post-op Note  Patient: Kevin Mcclain  Procedure(s) Performed: Procedure(s): RIGHT ENDOSCOPIC ANTROSTOMY AND ANTERIOR ETHMOIDECTOMY  (Right) Extraction of tooth #2 and 31 with alveoloplasty after sectioning of bridge at distal #29 with full mouth debridement of remaining dention (N/A)  Patient Location: PACU  Anesthesia Type: General   Level of Consciousness: awake, alert  and oriented  Airway and Oxygen Therapy: Patient Spontanous Breathing  Post-op Pain: mild  Post-op Assessment: Post-op Vital signs reviewed  Post-op Vital Signs: Reviewed  Last Vitals:  Filed Vitals:   11/14/14 1311  BP: 135/64  Pulse: 76  Temp:   Resp: 16    Complications: No apparent anesthesia complications

## 2014-11-14 NOTE — Op Note (Signed)
OPERATIVE REPORT  Patient:            Kevin Mcclain Date of Birth:  March 22, 1945 MRN:                [id number]   DATE OF PROCEDURE:  11/14/2014  PREOPERATIVE DIAGNOSES: 1. Squamous cell carcinoma of the right tonsil 2. Preradiation therapy dental protocol 3. Chronic apical periodontitis 4. Chronic periodontitis 5. Accretions  POSTOPERATIVE DIAGNOSES: 1. Squamous cell carcinoma of the right tonsil 2. Preradiation therapy dental protocol 3. Chronic apical periodontitis 4. Chronic periodontitis 5. Accretions  OPERATIONS: 1. Multiple extraction of tooth numbers 2 and 31 after sectioning of the bridge on the distal of #29 2. 2 Quadrants of alveoloplasty 3. Gross debridement of remaining dentition   SURGEON: Lenn Cal, DDS  ASSISTANT: Camie Patience, (dental assistant)  ANESTHESIA: General anesthesia via oral endotracheal tube.  MEDICATIONS: 1. Ancef 2 g IV prior to Dr. Noreene Filbert procedure.  2. Local anesthesia with a total utilization of 4 carpules each containing 34 mg of lidocaine with 0.017 mg of epinephrine as well as 1 carpules each containing 9 mg of bupivacaine with 0.009 mg of epinephrine.  SPECIMENS: There are 2 teeth that were discarded.  DRAINS: None  CULTURES: None  COMPLICATIONS: None   ESTIMATED BLOOD LOSS: 25 mLs.  INTRAVENOUS FLUIDS: Lactated ringers solution per the anesthesia team  INDICATIONS: The patient was recently diagnosed with squamous cell carcinoma of the right tonsil.  A dental consultation was then requested as part of a medically necessary preradiation therapy dental protocol.  The patient was examined and treatment planned for extraction of tooth numbers 2 and 31 with alveoloplasty after sectioning at the distal of #29 as well as gross debridement of remaining dentition.  This treatment plan was formulated to decrease the risks and complications associated with dental infection from affecting the patient's systemic health and to  prevent future complications such as osteoradionecrosis.  OPERATIVE FINDINGS: Patient was examined operating room number 2.  The teeth were identified for extraction. The patient was noted be affected by chronic apical periodontitis, chronic periodontitis, and accretions.    DESCRIPTION OF PROCEDURE: Patient was brought to the main operating room number 2. Patient was then placed in the supine position on the operating table. General anesthesia was then induced per the anesthesia team. Dr. Erik Obey then performed his procedure. After this, the patient was then prepped and draped in the usual manner for dental medicine procedure. A timeout was performed. The patient was identified and procedures were verified. The original throat pack from Dr. Erik Obey was removed and a new throat pack was placed at this time. The oral cavity was then thoroughly examined with the findings noted above. The patient was then ready for dental medicine procedure as follows:  Local anesthesia was then administered sequentially with a total utilization of 4 carpules each containing 34 mg of lidocaine with 0.017 mg of epinephrine as well as 1 carpules  each containing 9 mg bupivacaine with 0.009 mg of epinephrine.  The Maxillary right quadrant was first approached. Anesthesia was then delivered utilizing infiltration with lidocaine with epinephrine. A #15 blade incision was then made from the maxillary right tuberosity and extended to the mesial of tooth #4.  A  surgical flap was then carefully reflected. Tooth #2 was then subluxated with a series of straight elevators and removed without complication with a 70Y forceps. Alveoloplasty was then performed utilizing a ronguers and bone file. The surgical site was then irrigated with copious  amounts of sterile saline. The tissues were approximated and trimmed appropriately. A piece of Surgifoam was then placed in the extraction socket appropriately. The surgical site was then closed  from the maxillary right tuberosity and extended to the distal of #4 utilizing 3-0 chromic gut suture in a continuous interrupted suture technique 1.  At this point time, the mandibular right quadrant was approached. The patient was given an inferior alveolar nerve blocks and long buccal nerve block utilizing the bupivacaine with epinephrine. Further infiltration was then achieved utilizing the lidocaine with epinephrine. The distal of #29 was then approached. The bridge was sectioned at the distal of #29 appropriately utilizing a surgical handpiece and bur and copious amounts of sterile water. Tooth #31 was then approached and subluxated with a 23 forceps. This removed the coronal aspect of tooth #31 and the remaining pontic #30. A 15 blade incision was then made from the distal of number 32 and extended to the distal of #28.  A surgical flap was then carefully reflected. Appropriate amounts of buccal and interseptal bone were then removed around the remaining roots of #31 utilizing a surgical handpiece and copious amount of sterile water. The roots were then elevated with a series of straight elevators. The remaining roots were then elevated out with a series of cryers elevators without complication. Alveoloplasty was then performed utilizing a rongeurs and bone file. The tissues were approximated and trimmed appropriately. The surgical sites were then irrigated with copious amounts of sterile saline 4.  A piece of Surgifoam was then placed in the extraction sockets appropriately. The mandibular right surgical site was then closed from the distal of 32 and extended to the distal of #29 utilizing 3-0 chromic gut suture in a continuous interrupted suture technique 1.  At this point time the remaining dentition was approached. A gross debridement of remaining teeth was performed utilizing a sonic scaler. A series of hand curettes were then utilized to further remove accretions. A sonic scaler was then again  used to further refine removal of accretions.   At this point time, the entire mouth was irrigated with copious amounts of sterile saline. The patient was examined for complications, seeing none, the dental medicine procedure was deemed to be complete. The throat pack was removed at this time. A series of 4 x 4 gauze were placed in the mouth to aid hemostasis. The patient was then handed over to the anesthesia team for final disposition. After an appropriate amount of time, the patient was extubated and taken to the postanesthsia care unit in good condition. All counts were correct for the dental medicine procedure. The patient has been given appropriate pain medication and antibiotics by Dr. Erik Obey. Patient will be seen for reevaluation of healing and suture removal in 7-10 days. Patient may be seen earlier to insert fluoride trays and scatter protection devices if requested  by Dr. Isidore Moos.   Lenn Cal, DDS.

## 2014-11-14 NOTE — Anesthesia Procedure Notes (Signed)
Procedure Name: Intubation Date/Time: 11/14/2014 8:55 AM Performed by: Vennie Homans Pre-anesthesia Checklist: Patient identified, Timeout performed, Emergency Drugs available, Suction available and Patient being monitored Patient Re-evaluated:Patient Re-evaluated prior to inductionOxygen Delivery Method: Circle system utilized Preoxygenation: Pre-oxygenation with 100% oxygen Intubation Type: IV induction Ventilation: Mask ventilation without difficulty Laryngoscope Size: Miller and 2 Grade View: Grade III Tube type: Oral Tube size: 7.5 mm Number of attempts: 3 Airway Equipment and Method: Stylet Placement Confirmation: ETT inserted through vocal cords under direct vision,  breath sounds checked- equal and bilateral and positive ETCO2 Secured at: 23 cm Tube secured with: Tape Dental Injury: Teeth and Oropharynx as per pre-operative assessment and Injury to lip  Comments: Cords anterior, unable to visualize.  Distal aretenoids visible.  Large, floppy epiglottis.

## 2014-11-15 ENCOUNTER — Ambulatory Visit (HOSPITAL_COMMUNITY): Payer: Medicare Other

## 2014-11-15 ENCOUNTER — Ambulatory Visit (HOSPITAL_COMMUNITY): Payer: Medicare Other | Attending: Radiation Oncology

## 2014-11-15 ENCOUNTER — Encounter (HOSPITAL_COMMUNITY): Payer: Self-pay | Admitting: Otolaryngology

## 2014-11-16 ENCOUNTER — Encounter: Payer: Self-pay | Admitting: Nutrition

## 2014-11-19 ENCOUNTER — Encounter: Payer: Self-pay | Admitting: Nutrition

## 2014-11-19 NOTE — Progress Notes (Signed)
Patient did not show up for nutrition appointment on Friday, September 16.

## 2014-11-20 ENCOUNTER — Ambulatory Visit (HOSPITAL_COMMUNITY): Payer: Self-pay | Admitting: Dentistry

## 2014-11-20 ENCOUNTER — Encounter (HOSPITAL_COMMUNITY): Payer: Self-pay | Admitting: Dentistry

## 2014-11-20 VITALS — BP 130/56 | HR 65 | Temp 97.9°F

## 2014-11-20 DIAGNOSIS — K08409 Partial loss of teeth, unspecified cause, unspecified class: Secondary | ICD-10-CM

## 2014-11-20 DIAGNOSIS — C099 Malignant neoplasm of tonsil, unspecified: Secondary | ICD-10-CM

## 2014-11-20 DIAGNOSIS — Z01818 Encounter for other preprocedural examination: Secondary | ICD-10-CM | POA: Diagnosis not present

## 2014-11-20 DIAGNOSIS — Z463 Encounter for fitting and adjustment of dental prosthetic device: Secondary | ICD-10-CM

## 2014-11-20 MED ORDER — SODIUM FLUORIDE 1.1 % DT GEL: DENTAL | Status: DC

## 2014-11-20 NOTE — Progress Notes (Signed)
POST OPERATIVE NOTE:  11/20/2014   Kevin Mcclain [id number]  VITALS: BP 130/56 mmHg  Pulse 65  Temp(Src) 97.9 F (36.6 C) (Oral)  LABS:  Lab Results  Component Value Date   WBC 9.0 11/14/2014   HGB 14.7 11/14/2014   HCT 42.0 11/14/2014   MCV 89.4 11/14/2014   PLT 187 11/14/2014   BMET    Component Value Date/Time   NA 138 11/14/2014 0708   K 3.6 11/14/2014 0708   CL 106 11/14/2014 0708   CO2 21* 11/14/2014 0708   GLUCOSE 131* 11/14/2014 0708   BUN 13 11/14/2014 0708   CREATININE 0.93 11/14/2014 0708   CREATININE 0.81 09/19/2010 1339   CALCIUM 9.5 11/14/2014 0708   GFRNONAA >60 11/14/2014 0708   GFRAA >60 11/14/2014 0708    No results found for: INR, PROTIME No results found for: PTT   Kevin Mcclain is status post sinus surgery with Dr. Erik Obey followed by multiple extractions with alveoloplasty and gross debridement of teeth in the operating room on 11/14/2014. Patient now presents for examination of healing and insertion of upper lower fluoride trays and scatter guards.  SUBJECTIVE: Patient has minimal discomfort from dental extraction sites.   EXAM: There is no sign of oral infection, heme, or ooze. Sutures are intact. Patient is healing in by generalized primary closure. There is no evidence of oral antral fistula or sinus involvement today. Sutures will not be removed today and patient will follow-up in approximately one week for suture removal. Patient is aware that sutures will dissolve on their own.  PROCEDURE: Appliances were tried in and adjusted as needed. [place] Trismus device was previously fabricated at 35 mm using 22 sticks. Postop instructions were provided and a written and verbal format concerning the use and care of appliances. All questions were answered.   ASSESSMENT: Post operative course is consistent with dental procedures performed in the OR.  PLAN: 1. Continue salt water rinses as needed to aid healing. 2. Brush teeth  after meals and at bedtime.  Floss at bedtime.  3. Use fluoride in fluoride trays at bedtime as instructed. 4. Return to clinic in approximately one week for suture removal.  Call or problems arise before then.   Lenn Cal, DDS

## 2014-11-20 NOTE — Patient Instructions (Addendum)
FLUORIDE TRAYS PATIENT INSTRUCTIONS    Obtain prescription from the pharmacy.  Don't be surprised if it needs to be ordered.  Be sure to let the pharmacy know when you are close to needing a new refill for them to have it ready for you without interruption of Fluoride use.  The best time to use your Fluoride is before bed time.  You must brush your teeth very well and floss before using the Fluoride in order to get the best use out of the Fluoride treatments.  Place 1 drop of Fluoride gel per tooth in the tray.  Place the tray on your lower teeth and your upper teeth.  Make sure the trays are seated all the way.  Remember, they only fit one way on your teeth.  Insert for 5 full minutes.  At the end of the 5 minutes, take the trays out.  SPIT OUT excess. .  Do NOT rinse your mouth!  Do NOT eat or drink after treatments for at least 30 minutes.  This is why the best time for your treatments is before bedtime.  Clean the inside of your Fluoride trays using COLD WATER and a toothbrush.  In order to keep your Trays from discoloring and free from odors, soak them overnight in denture cleaners such as Efferdent.  Do not use bleach or non denture products.  Store the trays in a safe dry place AWAY from any heat until your next treatment.  If anything happens to your Fluoride trays, or they don't fit as well after any dental work, please let us know as soon as possible. MOUTH CARE AFTER SURGERY  FACTS:  Ice used in ice bag helps keep the swelling down, and can help lessen the pain.  It is easier to treat pain BEFORE it happens.  Spitting disturbs the clot and may cause bleeding to start again, or to get worse.  Smoking delays healing and can cause complications.  Sharing prescriptions can be dangerous.  Do not take medications not recently prescribed for you.  Antibiotics may stop birth control pills from working.  Use other means of birth control while on antibiotics.  Warm  salt water rinses after the first 24 hours will help lessen the swelling:  Use 1/2 teaspoonful of table salt per oz.of water.  DO NOT:  Do not spit.  Do not drink through a straw.  Strongly advised not to smoke, dip snuff or chew tobacco at least for 3 days.  Do not eat sharp or crunchy foods.  Avoid the area of surgery when chewing.  Do not stop your antibiotics before your instructions say to do so.  Do not eat hot foods until bleeding has stopped.  If you need to, let your food cool down to room temperature.  EXPECT:  Some swelling, especially first 2-3 days.  Soreness or discomfort in varying degrees.  Follow your dentist's instructions about how to handle pain before it starts.  Pinkish saliva or light blood in saliva, or on your pillow in the morning.  This can last around 24 hours.  Bruising inside or outside the mouth.  This may not show up until 2-3 days after surgery.  Don't worry, it will go away in time.  Pieces of "bone" may work themselves loose.  It's OK.  If they bother you, let us know.  WHAT TO DO IMMEDIATELY AFTER SURGERY:  Bite on the gauze with steady pressure for 1-2 hours.  Don't chew on the gauze.  Do  not lie down flat.  Raise your head support especially for the first 24 hours.  Apply ice to your face on the side of the surgery.  You may apply it 20 minutes on and a few minutes off.  Ice for 8-12 hours.  You may use ice up to 24 hours.  Before the numbness wears off, take a pain pill as instructed.  Prescription pain medication is not always required.  SWELLING:  Expect swelling for the first couple of days.  It should get better after that.  If swelling increases 3 days or so after surgery; let us know as soon as possible.  FEVER:  Take Tylenol every 4 hours if needed to lower your temperature, especially if it is at 100F or higher.  Drink lots of fluids.  If the fever does not go away, let us know.  BREATHING TROUBLE:  Any unusual  difficulty breathing means you have to have someone bring you to the emergency room ASAP  BLEEDING:  Light oozing is expected for 24 hours or so.  Prop head up with pillows  Avoid spitting  Do not confuse bright red fresh flowing blood with lots of saliva colored with a little bit of blood.  If you notice some bleeding, place gauze or a tea bag where it is bleeding and apply CONSTANT pressure by biting down for 1 hour.  Avoid talking during this time.  Do not remove the gauze or tea bag during this hour to "check" the bleeding.  If you notice bright RED bleeding FLOWING out of particular area, and filling the floor of your mouth, put a wad of gauze on that area, bite down firmly and constantly.  Call us immediately.  If we're closed, have someone bring you to the emergency room.  ORAL HYGIENE:  Brush your teeth as usual after meals and before bedtime.  Use a soft toothbrush around the area of surgery.  DO NOT AVOID BRUSHING.  Otherwise bacteria(germs) will grow and may delay healing or encourage infection.  Since you cannot spit, just gently rinse and let the water flow out of your mouth.  DO NOT SWISH HARD.  EATING:  Cool liquids are a good point to start.  Increase to soft foods as tolerated.  PRESCRIPTIONS:  Follow the directions for your prescriptions exactly as written.  If Dr. Enrique Sack gave you a narcotic pain medication, do not drive, operate machinery or drink alcohol when on that medication.  QUESTIONS:  Call our office during office hours [phone] or call the Emergency Room at [phone].    SINUS PRECAUTIONS AFTER ORAL SURGERY  AVOID   Blowing your nose  It is best to wipe away nasal secretions carefully. After 2 weeks, if you must blow  your nose, blow gently through both sides at the same time. Do not pinch your  nose; do not blow just one side at a time.   Sneezing  If you must sneeze, keep your mouth open and do not pinch your nose  closed.   Sucking  Do not drink through a straw. Do not smoke.    Blowing  Do not play a wind instrument. Do not blow a balloons.   Pushing or lifting  Do not lift or push objects weighing more than 20 pounds.   Bending over  Keeping her head above the level of your heart. Sleep with your head slightly raised.   Notify your dentist if you bleed from your nose. If you see bleeding from your  nose, have neck stiffness, or increased sensitivity to bright light or severe headache, call Dental Medicine or proceed to the Emergency Department for evaluation immediately.  Notify your dentist if you are unable to take any of your medications as prescribed. You may be advised to take an antibiotic and decongestant as well as your regular pain medication. You must take these medications as prescribed. Do not stop taking them on her own. If you have a problem with any medication, please call us so that we can make an adjustment for you.  Contacts: 1. Dental Medicine: [phone] 8. [facility]: [phone]

## 2014-11-21 ENCOUNTER — Encounter: Payer: Self-pay | Admitting: *Deleted

## 2014-11-21 ENCOUNTER — Ambulatory Visit
Admission: RE | Admit: 2014-11-21 | Discharge: 2014-11-21 | Disposition: A | Discharge status: Home / Self Care | Payer: Medicare Other | Source: Ambulatory Visit | Attending: Radiation Oncology | Admitting: Radiation Oncology

## 2014-11-21 VITALS — BP 120/57 | HR 65 | Temp 97.9°F | Resp 16 | Wt 148.4 lb

## 2014-11-21 DIAGNOSIS — Z510 Encounter for antineoplastic radiation therapy: Secondary | ICD-10-CM | POA: Diagnosis not present

## 2014-11-21 DIAGNOSIS — C099 Malignant neoplasm of tonsil, unspecified: Secondary | ICD-10-CM

## 2014-11-21 MED ORDER — SODIUM CHLORIDE 0.9 % IJ SOLN
10.0000 mL | Freq: Once | INTRAMUSCULAR | Status: AC
  Administered 2014-11-21: 10 mL via INTRAVENOUS

## 2014-11-21 NOTE — Progress Notes (Signed)
Gayleen Orem, RN reports he removed the right wrist IV while the patient was in CT/SIM. Liliane Channel reports the patient tolerated this well and that he applied a bandaid to the old access site. Reports the catheter was intact upon removal.

## 2014-11-21 NOTE — Progress Notes (Signed)
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 An Aquaplast head and shoulder mask was custom fitted to the patient's anatomy. High-resolution CT axial imaging was obtained of the head and neck with contrast. I verified that the quality of the imaging is good for treatment planning. 1 Medically Necessary Treatment Device was fabricated and supervised by me: Aquaplast mask.   Treatment planning note I plan to treat the patient with IMRT. I plan to treat the patient's right tonsil tumor and bilateral neck nodes. I plan to treat to a total dose of 70 Gray in 35  fractions. Dose calculation was ordered from dosimetry.  IMRT planning Note  IMRT is an important modality to deliver adequate dose to the patient's at risk tissues while sparing the patient's normal structures, including the: esophagus, parotid tissue, mandible, brain stem, spinal cord, oral cavity, brachial plexus.  This justifies the use of IMRT in the patient's treatment.   -----------------------------------  Eppie Gibson, MD

## 2014-11-21 NOTE — Progress Notes (Signed)
Received patient and his wife in the clinic today for an IV start prior to CT/SIM. Patient denies a history of radiation therapy. Patient reports he held his Metformin this morning as directed. BUN of 13 and creatinine of 0.93 noted from seven days ago. Right wrist 22 gauge IV started on the first attempt. Excellent blood return obtained. Site flushed without difficulty. Secured IV in place. Patient tolerated well. Kevin Mcclain to arrange a lab appointment for Friday around 1000 to access BUN and Creatinine. Patient understands nursing staff will contact him with lab results and direction on when to resume Metformin. Patient escorted to CT/SIM by Liliane Channel, RN.

## 2014-11-23 ENCOUNTER — Telehealth: Payer: Self-pay | Admitting: Radiation Oncology

## 2014-11-23 ENCOUNTER — Other Ambulatory Visit: Payer: Self-pay

## 2014-11-23 ENCOUNTER — Ambulatory Visit
Admission: RE | Admit: 2014-11-23 | Discharge: 2014-11-23 | Disposition: A | Discharge status: Home / Self Care | Payer: Medicare Other | Source: Ambulatory Visit | Attending: Radiation Oncology | Admitting: Radiation Oncology

## 2014-11-23 DIAGNOSIS — C099 Malignant neoplasm of tonsil, unspecified: Secondary | ICD-10-CM

## 2014-11-23 LAB — BUN AND CREATININE (CC13)
BUN: 12.8 mg/dL (ref 7.0–26.0)
CREATININE: 0.8 mg/dL (ref 0.7–1.3)
EGFR: 90 mL/min/{1.73_m2} — AB (ref >90)

## 2014-11-23 NOTE — Telephone Encounter (Signed)
Per Dr. Pearlie Oyster order phoned patient's home to inform him of normal BUN/creatinine results and to resume Metformin. Spoke with patient's wife. She verbalized understanding and expressed appreciation for the call.

## 2014-11-24 NOTE — Progress Notes (Signed)
  Oncology Nurse Navigator Documentation   Navigator Encounter Type: Clinic/MDC (11/21/14 1315) Patient Visit Type: KCXWNP (11/21/14 1315) Treatment Phase: CT SIM (11/21/14 1315)     To provide support and encouragement, care continuity and to assess for needs, met with patient during his CT SIM.  He was accompanied by his wife. We reviewed procedure for Tomo tmt. He tolerated SIM without difficulty.  Gayleen Orem, RN, BSN, [place] at [place] [phone]                  Time Spent with Patient: 30 (11/21/14 1315)

## 2014-11-27 ENCOUNTER — Telehealth: Payer: Self-pay | Admitting: Internal Medicine

## 2014-11-27 DIAGNOSIS — M25512 Pain in left shoulder: Secondary | ICD-10-CM

## 2014-11-27 DIAGNOSIS — M159 Polyosteoarthritis, unspecified: Secondary | ICD-10-CM

## 2014-11-27 DIAGNOSIS — M150 Primary generalized (osteo)arthritis: Secondary | ICD-10-CM

## 2014-11-27 DIAGNOSIS — M8949 Other hypertrophic osteoarthropathy, multiple sites: Secondary | ICD-10-CM

## 2014-11-27 NOTE — Telephone Encounter (Signed)
Pt needs new rx hydrocodone °

## 2014-11-28 ENCOUNTER — Encounter (HOSPITAL_COMMUNITY): Payer: Self-pay | Admitting: Dentistry

## 2014-11-28 ENCOUNTER — Ambulatory Visit (HOSPITAL_COMMUNITY): Payer: Self-pay | Admitting: Dentistry

## 2014-11-28 VITALS — BP 126/51 | HR 68 | Temp 98.4°F

## 2014-11-28 DIAGNOSIS — K08409 Partial loss of teeth, unspecified cause, unspecified class: Secondary | ICD-10-CM

## 2014-11-28 DIAGNOSIS — Z01818 Encounter for other preprocedural examination: Secondary | ICD-10-CM

## 2014-11-28 DIAGNOSIS — Z510 Encounter for antineoplastic radiation therapy: Secondary | ICD-10-CM | POA: Diagnosis not present

## 2014-11-28 DIAGNOSIS — C099 Malignant neoplasm of tonsil, unspecified: Secondary | ICD-10-CM

## 2014-11-28 NOTE — Progress Notes (Signed)
POST OPERATIVE NOTE:  11/28/2014   Kevin Mcclain [id number]  VITALS: BP 126/51 mmHg  Pulse 68  Temp(Src) 98.4 F (36.9 C) (Oral)  LABS:  Lab Results  Component Value Date   WBC 9.0 11/14/2014   HGB 14.7 11/14/2014   HCT 42.0 11/14/2014   MCV 89.4 11/14/2014   PLT 187 11/14/2014   BMET    Component Value Date/Time   NA 138 11/14/2014 0708   K 3.6 11/14/2014 0708   CL 106 11/14/2014 0708   CO2 21* 11/14/2014 0708   GLUCOSE 131* 11/14/2014 0708   BUN 12.8 11/23/2014 1013   BUN 13 11/14/2014 0708   CREATININE 0.8 11/23/2014 1013   CREATININE 0.93 11/14/2014 0708   CREATININE 0.81 09/19/2010 1339   CALCIUM 9.5 11/14/2014 0708   GFRNONAA >60 11/14/2014 0708   GFRAA >60 11/14/2014 0708    No results found for: INR, PROTIME No results found for: PTT   Kevin Mcclain is status post sinus surgery with Dr. Erik Obey followed by multiple extractions with alveoloplasty and gross debridement of teeth in the operating room on 11/14/2014. Patient now presents for examination of healing and suture removal as needed.  SUBJECTIVE: Patient has minimal discomfort from dental extraction sites. Stiches are still present.  EXAM: There is no sign of oral infection, heme, or ooze. Sutures are loosely intact. There is no evidence of oral antral fistula or sinus involvement today. Val salva maneuver was negative. Upper right surgical site will heal in by secondary intention.  PROCEDURE: The patient was given a rinse with chlorhexidine for 30 seconds. Sutures removed without complication. Patient tolerated the procedure well. Trismus device was previously fabricated at 35 mm using 22 sticks.  ASSESSMENT: Post operative course is consistent with dental procedures performed in the OR.  PLAN: 1. Continue salt water rinses as needed to aid healing. 2. Brush teeth after meals and at bedtime.  Floss at bedtime.  3. Use fluoride in fluoride trays at bedtime as instructed. 4. Return to  clinic in 2-3 weeks for oral examination during radiation treatments.  Call or problems arise before then.   Lenn Cal, DDS

## 2014-11-28 NOTE — Telephone Encounter (Signed)
Ok to RF x 1

## 2014-11-28 NOTE — Patient Instructions (Signed)
PLAN: 1. Continue salt water rinses as needed to aid healing. 2. Brush teeth after meals and at bedtime.  Floss at bedtime.  3. Use fluoride in fluoride trays at bedtime as instructed. 4. Return to clinic in 2-3 weeks for oral examination during radiation treatments.  Call or problems arise before then.   Lenn Cal, DDS

## 2014-11-29 MED ORDER — HYDROCODONE-ACETAMINOPHEN 5-325 MG PO TABS: 1.0000 | ORAL_TABLET | Freq: Two times a day (BID) | ORAL | Status: DC | PRN

## 2014-11-29 NOTE — Telephone Encounter (Signed)
rx ready for pick up and wife is aware.

## 2014-12-03 ENCOUNTER — Encounter: Payer: Self-pay | Admitting: *Deleted

## 2014-12-03 ENCOUNTER — Ambulatory Visit
Admission: RE | Admit: 2014-12-03 | Discharge: 2014-12-03 | Disposition: A | Discharge status: Home / Self Care | Payer: Medicare Other | Source: Ambulatory Visit | Attending: Radiation Oncology | Admitting: Radiation Oncology

## 2014-12-03 ENCOUNTER — Encounter: Payer: Self-pay | Admitting: Radiation Oncology

## 2014-12-03 VITALS — BP 130/59 | HR 65 | Temp 97.9°F | Ht 63.0 in | Wt 148.9 lb

## 2014-12-03 DIAGNOSIS — C099 Malignant neoplasm of tonsil, unspecified: Secondary | ICD-10-CM

## 2014-12-03 DIAGNOSIS — Z510 Encounter for antineoplastic radiation therapy: Secondary | ICD-10-CM | POA: Diagnosis not present

## 2014-12-03 NOTE — Progress Notes (Signed)
[name] is here for his first fraction of radiation for tonsil cancer. He states he is doing well, he reports no fatigue, no pain, or difficulty swallowing. He has no mucositis.  BP 130/59 mmHg  Pulse 65  Temp(Src) 97.9 F (36.6 C)  Ht 5\' 3"  (1.6 m)  Wt 148 lb 14.4 oz (67.541 kg)  BMI 26.38 kg/m2  SpO2 98%  Wt Readings from Last 3 Encounters:  12/03/14 148 lb 14.4 oz (67.541 kg)  11/21/14 148 lb 6.4 oz (67.314 kg)  11/14/14 155 lb 5 oz (70.449 kg)

## 2014-12-03 NOTE — Progress Notes (Signed)
Duplicate entry

## 2014-12-03 NOTE — Progress Notes (Signed)
   Weekly Management Note:  Outpatient    ICD-9-CM ICD-10-CM   1. Tonsil cancer ([place]) 146.0 C09.9       Current Dose:  2 Gy  Projected Dose: 70 Gy   Narrative:  The patient presents for routine under treatment assessment.  CBCT/MVCT images/Port film x-rays were reviewed.  The chart was checked. Doing well, no complaints  Physical Findings:  height is 5\' 3"  (1.6 m) and weight is 148 lb 14.4 oz (67.541 kg). His temperature is 97.9 F (36.6 C). His blood pressure is 130/59 and his pulse is 65. His oxygen saturation is 98%.   Wt Readings from Last 3 Encounters:  12/03/14 148 lb 14.4 oz (67.541 kg)  11/21/14 148 lb 6.4 oz (67.314 kg)  11/14/14 155 lb 5 oz (70.449 kg)   Right tonsil tumor visible, no obvious neck masses appreciated  CMP     Component Value Date/Time   NA 138 11/14/2014 0708   K 3.6 11/14/2014 0708   CL 106 11/14/2014 0708   CO2 21* 11/14/2014 0708   GLUCOSE 131* 11/14/2014 0708   BUN 12.8 11/23/2014 1013   BUN 13 11/14/2014 0708   CREATININE 0.8 11/23/2014 1013   CREATININE 0.93 11/14/2014 0708   CREATININE 0.81 09/19/2010 1339   CALCIUM 9.5 11/14/2014 0708   PROT 7.3 12/04/2013 0911   ALBUMIN 4.5 12/04/2013 0911   AST 25 12/04/2013 0911   ALT 28 12/04/2013 0911   ALKPHOS 64 12/04/2013 0911   BILITOT 1.0 12/04/2013 0911   GFRNONAA >60 11/14/2014 0708   GFRAA >60 11/14/2014 0708    CBC    Component Value Date/Time   WBC 9.0 11/14/2014 0708   RBC 4.70 11/14/2014 0708   HGB 14.7 11/14/2014 0708   HCT 42.0 11/14/2014 0708   PLT 187 11/14/2014 0708   MCV 89.4 11/14/2014 0708   MCH 31.3 11/14/2014 0708   MCHC 35.0 11/14/2014 0708   RDW 13.3 11/14/2014 0708   LYMPHSABS 3.8 12/04/2013 0911   MONOABS 0.9 12/04/2013 0911   EOSABS 0.3 12/04/2013 0911   BASOSABS 0.1 12/04/2013 0911     Impression:  The patient is tolerating radiotherapy.  Plan:  Continue radiotherapy as planned.    ________________________________   Eppie Gibson, M.D.

## 2014-12-03 NOTE — Progress Notes (Signed)
IMRT Device Note  outpatient    ICD-9-CM ICD-10-CM   1. Tonsil cancer ([place]) 146.0 C09.9     9.4 delivered field widths represent one set of IMRT treatment devices. The code is [phone].  -----------------------------------  Eppie Gibson, MD

## 2014-12-04 ENCOUNTER — Ambulatory Visit
Admission: RE | Admit: 2014-12-04 | Discharge: 2014-12-04 | Disposition: A | Discharge status: Home / Self Care | Payer: Medicare Other | Source: Ambulatory Visit | Attending: Radiation Oncology | Admitting: Radiation Oncology

## 2014-12-04 DIAGNOSIS — C099 Malignant neoplasm of tonsil, unspecified: Secondary | ICD-10-CM | POA: Insufficient documentation

## 2014-12-04 DIAGNOSIS — Z510 Encounter for antineoplastic radiation therapy: Secondary | ICD-10-CM | POA: Diagnosis not present

## 2014-12-04 MED ORDER — BIAFINE EX EMUL
CUTANEOUS | Status: DC | PRN
  Administered 2014-12-04: 16:00:00 via TOPICAL

## 2014-12-04 NOTE — Progress Notes (Signed)
  Oncology Nurse Navigator Documentation   Navigator Encounter Type: Treatment (12/03/14 1520) Patient Visit Type: NPHQNE (12/03/14 1520) Treatment Phase: First Radiation Tx (12/03/14 1520) Barriers/Navigation Needs: Education (12/03/14 1520)     To provide support and encouragement, care continuity and to assess for needs, met with patient during his New Start Tomo.  He was accompanied by his wife. I reviewed the check-in, arrival, and treatment procedure. Wife watched treatment, RTTs and I explained treatment, she voiced appreciation for opportunity to watch and the information. Patient tolerated tmt without difficulty. I escorted him to [place] with Dr. Isidore Moos. He did not express any needs or concerns at this time, I encouraged him to contact me if that changes, he verbalized understanding.  Gayleen Orem, RN, BSN, [place] at [place] [phone]                Time Spent with Patient: 45 (12/03/14 1520)

## 2014-12-05 ENCOUNTER — Ambulatory Visit
Admission: RE | Admit: 2014-12-05 | Discharge: 2014-12-05 | Disposition: A | Discharge status: Home / Self Care | Payer: Medicare Other | Source: Ambulatory Visit | Attending: Radiation Oncology | Admitting: Radiation Oncology

## 2014-12-05 DIAGNOSIS — Z510 Encounter for antineoplastic radiation therapy: Secondary | ICD-10-CM | POA: Diagnosis not present

## 2014-12-06 ENCOUNTER — Ambulatory Visit
Admission: RE | Admit: 2014-12-06 | Discharge: 2014-12-06 | Disposition: A | Discharge status: Home / Self Care | Payer: Medicare Other | Source: Ambulatory Visit | Attending: Radiation Oncology | Admitting: Radiation Oncology

## 2014-12-06 DIAGNOSIS — Z510 Encounter for antineoplastic radiation therapy: Secondary | ICD-10-CM | POA: Diagnosis not present

## 2014-12-07 ENCOUNTER — Ambulatory Visit
Admission: RE | Admit: 2014-12-07 | Discharge: 2014-12-07 | Disposition: A | Discharge status: Home / Self Care | Payer: Medicare Other | Source: Ambulatory Visit | Attending: Radiation Oncology | Admitting: Radiation Oncology

## 2014-12-07 DIAGNOSIS — Z510 Encounter for antineoplastic radiation therapy: Secondary | ICD-10-CM | POA: Diagnosis not present

## 2014-12-10 ENCOUNTER — Encounter: Payer: Self-pay | Admitting: Radiation Oncology

## 2014-12-10 ENCOUNTER — Ambulatory Visit: Payer: Medicare Other | Admitting: Internal Medicine

## 2014-12-10 ENCOUNTER — Ambulatory Visit
Admission: RE | Admit: 2014-12-10 | Discharge: 2014-12-10 | Disposition: A | Discharge status: Home / Self Care | Payer: Medicare Other | Source: Ambulatory Visit | Attending: Radiation Oncology | Admitting: Radiation Oncology

## 2014-12-10 VITALS — BP 122/60 | HR 71 | Temp 97.7°F | Ht 63.0 in | Wt 149.5 lb

## 2014-12-10 DIAGNOSIS — C099 Malignant neoplasm of tonsil, unspecified: Secondary | ICD-10-CM

## 2014-12-10 DIAGNOSIS — Z510 Encounter for antineoplastic radiation therapy: Secondary | ICD-10-CM | POA: Diagnosis not present

## 2014-12-10 MED ORDER — LIDOCAINE VISCOUS 2 % MT SOLN
OROMUCOSAL | Status: DC
Start: 2014-12-10 — End: 2015-06-10

## 2014-12-10 MED ORDER — SUCRALFATE 1 G PO TABS: ORAL_TABLET | ORAL | Status: DC

## 2014-12-10 MED ORDER — FLUCONAZOLE 100 MG PO TABS: ORAL_TABLET | ORAL | Status: DC

## 2014-12-10 NOTE — Progress Notes (Signed)
Mr. Schollmeyer is here for his 6th fraction of radiation to his right tonsil. He reports he is eating ok, but does state he is eating softer foods now. He reports his throat is sore, but still is able to eat well as he reports. He does complain of nausea at bedtime, and says it wakes him up at times. He also complains of a headache which he rates at a 6 on a scale of 0-10. The skin around his throat and mouth is tender, and he reports at times it feels like pins are poking in him. He reports using the biafine which does help him. He does have some thrush on his tongue, which he reports has been there since he started radiation.  Orthostatics: sitting- BP 111/61, pulse 62. Standing- BP 122/60, pulse 71. BP 122/60 mmHg  Pulse 71  Temp(Src) 97.7 F (36.5 C)  Ht 5\' 3"  (1.6 m)  Wt 149 lb 8 oz (67.813 kg)  BMI 26.49 kg/m2  Wt Readings from Last 3 Encounters:  12/10/14 149 lb 8 oz (67.813 kg)  12/03/14 148 lb 14.4 oz (67.541 kg)  11/21/14 148 lb 6.4 oz (67.314 kg)

## 2014-12-10 NOTE — Progress Notes (Signed)
   Weekly Management Note:  Outpatient    ICD-9-CM ICD-10-CM   1. Tonsil cancer ([facility]) 146.0 C09.9 sucralfate (CARAFATE) 1 G tablet     fluconazole (DIFLUCAN) 100 MG tablet     lidocaine (XYLOCAINE) 2 % solution    Current Dose:  12 Gy  Projected Dose: 70 Gy   Narrative:  The patient presents for routine under treatment assessment.  CBCT/MVCT images/Port film x-rays were reviewed.  The chart was checked. "Cotton mouth" and dry mouth, some tenderness in mouth.  Frontal HAs  Physical Findings:  Wt Readings from Last 3 Encounters:  12/10/14 149 lb 8 oz (67.813 kg)  12/03/14 148 lb 14.4 oz (67.541 kg)  11/21/14 148 lb 6.4 oz (67.314 kg)    height is 5\' 3"  (1.6 m) and weight is 149 lb 8 oz (67.813 kg). His temperature is 97.7 F (36.5 C). His blood pressure is 122/60 and his pulse is 71.  NAD, right tonsil tumor visible, no obvious thrush but tongue slightly white. No neck masses  CBC    Component Value Date/Time   WBC 9.0 11/14/2014 0708   RBC 4.70 11/14/2014 0708   HGB 14.7 11/14/2014 0708   HCT 42.0 11/14/2014 0708   PLT 187 11/14/2014 0708   MCV 89.4 11/14/2014 0708   MCH 31.3 11/14/2014 0708   MCHC 35.0 11/14/2014 0708   RDW 13.3 11/14/2014 0708   LYMPHSABS 3.8 12/04/2013 0911   MONOABS 0.9 12/04/2013 0911   EOSABS 0.3 12/04/2013 0911   BASOSABS 0.1 12/04/2013 0911     CMP     Component Value Date/Time   NA 138 11/14/2014 0708   K 3.6 11/14/2014 0708   CL 106 11/14/2014 0708   CO2 21* 11/14/2014 0708   GLUCOSE 131* 11/14/2014 0708   BUN 12.8 11/23/2014 1013   BUN 13 11/14/2014 0708   CREATININE 0.8 11/23/2014 1013   CREATININE 0.93 11/14/2014 0708   CREATININE 0.81 09/19/2010 1339   CALCIUM 9.5 11/14/2014 0708   PROT 7.3 12/04/2013 0911   ALBUMIN 4.5 12/04/2013 0911   AST 25 12/04/2013 0911   ALT 28 12/04/2013 0911   ALKPHOS 64 12/04/2013 0911   BILITOT 1.0 12/04/2013 0911   GFRNONAA >60 11/14/2014 0708   GFRAA >60 11/14/2014 0708     Impression:  The  patient is tolerating radiotherapy.   Plan:  Continue radiotherapy as planned. Rx as above for symptoms. Hold colchicine and atorvastatin while on fluconazole   -----------------------------------  Eppie Gibson, MD

## 2014-12-11 ENCOUNTER — Ambulatory Visit
Admission: RE | Admit: 2014-12-11 | Discharge: 2014-12-11 | Disposition: A | Discharge status: Home / Self Care | Payer: Medicare Other | Source: Ambulatory Visit | Attending: Radiation Oncology | Admitting: Radiation Oncology

## 2014-12-11 ENCOUNTER — Telehealth: Payer: Self-pay

## 2014-12-11 ENCOUNTER — Ambulatory Visit: Payer: Medicare Other

## 2014-12-11 DIAGNOSIS — Z510 Encounter for antineoplastic radiation therapy: Secondary | ICD-10-CM | POA: Diagnosis not present

## 2014-12-12 ENCOUNTER — Ambulatory Visit: Payer: Medicare Other

## 2014-12-12 ENCOUNTER — Ambulatory Visit
Admission: RE | Admit: 2014-12-12 | Discharge: 2014-12-12 | Disposition: A | Discharge status: Home / Self Care | Payer: Medicare Other | Source: Ambulatory Visit | Attending: Radiation Oncology | Admitting: Radiation Oncology

## 2014-12-12 DIAGNOSIS — Z510 Encounter for antineoplastic radiation therapy: Secondary | ICD-10-CM | POA: Diagnosis not present

## 2014-12-13 ENCOUNTER — Ambulatory Visit: Payer: Medicare Other

## 2014-12-13 ENCOUNTER — Ambulatory Visit
Admission: RE | Admit: 2014-12-13 | Discharge: 2014-12-13 | Disposition: A | Discharge status: Home / Self Care | Payer: Medicare Other | Source: Ambulatory Visit | Attending: Radiation Oncology | Admitting: Radiation Oncology

## 2014-12-13 DIAGNOSIS — Z510 Encounter for antineoplastic radiation therapy: Secondary | ICD-10-CM | POA: Diagnosis not present

## 2014-12-14 ENCOUNTER — Ambulatory Visit
Admission: RE | Admit: 2014-12-14 | Discharge: 2014-12-14 | Disposition: A | Discharge status: Home / Self Care | Payer: Medicare Other | Source: Ambulatory Visit | Attending: Radiation Oncology | Admitting: Radiation Oncology

## 2014-12-14 DIAGNOSIS — Z510 Encounter for antineoplastic radiation therapy: Secondary | ICD-10-CM | POA: Diagnosis not present

## 2014-12-17 ENCOUNTER — Ambulatory Visit
Admission: RE | Admit: 2014-12-17 | Discharge: 2014-12-17 | Disposition: A | Discharge status: Home / Self Care | Payer: Medicare Other | Source: Ambulatory Visit | Attending: Radiation Oncology | Admitting: Radiation Oncology

## 2014-12-17 ENCOUNTER — Telehealth: Payer: Self-pay | Admitting: *Deleted

## 2014-12-17 VITALS — BP 123/66 | HR 74 | Temp 98.2°F | Wt 146.6 lb

## 2014-12-17 DIAGNOSIS — C099 Malignant neoplasm of tonsil, unspecified: Secondary | ICD-10-CM

## 2014-12-17 DIAGNOSIS — Z510 Encounter for antineoplastic radiation therapy: Secondary | ICD-10-CM | POA: Diagnosis not present

## 2014-12-17 MED ORDER — HYDROCODONE-ACETAMINOPHEN 5-325 MG PO TABS: 1.0000 | ORAL_TABLET | ORAL | Status: DC | PRN

## 2014-12-17 MED ORDER — SENNA 8.6 MG PO TABS: 1.0000 | ORAL_TABLET | Freq: Every evening | ORAL | Status: DC | PRN

## 2014-12-17 MED ORDER — FENTANYL 12 MCG/HR TD PT72: 12.5000 ug | MEDICATED_PATCH | TRANSDERMAL | Status: DC

## 2014-12-17 NOTE — Progress Notes (Signed)
   Weekly Management Note:  Outpatient    ICD-9-CM ICD-10-CM   1. Tonsil cancer ([facility]) 146.0 C09.9 HYDROcodone-acetaminophen (NORCO/VICODIN) 5-325 MG tablet     fentaNYL (DURAGESIC) 12 MCG/HR     senna (SENOKOT) 8.6 MG TABS tablet    Current Dose:  22 Gy  Projected Dose: 70 Gy   Narrative:  The patient presents for routine under treatment assessment.  CBCT/MVCT images/Port film x-rays were reviewed.  The chart was checked. Increased mouth >> throat pain.  Physical Findings:  Wt Readings from Last 3 Encounters:  12/17/14 146 lb 9.6 oz (66.497 kg)  12/10/14 149 lb 8 oz (67.813 kg)  12/03/14 148 lb 14.4 oz (67.541 kg)    weight is 146 lb 9.6 oz (66.497 kg). His temperature is 98.2 F (36.8 C). His blood pressure is 123/66 and his pulse is 74. His oxygen saturation is 99%.  NAD, right tonsil tumor visible but shrinking, skin intact  CBC    Component Value Date/Time   WBC 9.0 11/14/2014 0708   RBC 4.70 11/14/2014 0708   HGB 14.7 11/14/2014 0708   HCT 42.0 11/14/2014 0708   PLT 187 11/14/2014 0708   MCV 89.4 11/14/2014 0708   MCH 31.3 11/14/2014 0708   MCHC 35.0 11/14/2014 0708   RDW 13.3 11/14/2014 0708   LYMPHSABS 3.8 12/04/2013 0911   MONOABS 0.9 12/04/2013 0911   EOSABS 0.3 12/04/2013 0911   BASOSABS 0.1 12/04/2013 0911     CMP     Component Value Date/Time   NA 138 11/14/2014 0708   K 3.6 11/14/2014 0708   CL 106 11/14/2014 0708   CO2 21* 11/14/2014 0708   GLUCOSE 131* 11/14/2014 0708   BUN 12.8 11/23/2014 1013   BUN 13 11/14/2014 0708   CREATININE 0.8 11/23/2014 1013   CREATININE 0.93 11/14/2014 0708   CREATININE 0.81 09/19/2010 1339   CALCIUM 9.5 11/14/2014 0708   PROT 7.3 12/04/2013 0911   ALBUMIN 4.5 12/04/2013 0911   AST 25 12/04/2013 0911   ALT 28 12/04/2013 0911   ALKPHOS 64 12/04/2013 0911   BILITOT 1.0 12/04/2013 0911   GFRNONAA >60 11/14/2014 0708   GFRAA >60 11/14/2014 0708     Impression:  The patient is tolerating radiotherapy.   Plan:   Continue radiotherapy as planned. Rx pain meds as above for symptoms.  Senna prn constipation. He has completed diflucan, no evidence of thrush on exam.  -----------------------------------  Eppie Gibson, MD

## 2014-12-17 NOTE — Progress Notes (Signed)
Wt Readings from Last 3 Encounters:  12/17/14 146 lb 9.6 oz (66.497 kg)  12/10/14 149 lb 8 oz (67.813 kg)  12/03/14 148 lb 14.4 oz (67.541 kg)   BP 134/54 mmHg  Pulse 69  Temp(Src) 98.2 F (36.8 C)  Wt 146 lb 9.6 oz (66.497 kg)  SpO2 99%  Standing 123/66 P 74. Weekly assessment of radiation to right tonsil.Completed 11 of 35 treatments.Pain/aburning of oral mucosa.Encouraged to use lidocaine and carafate and perform mouth exercises.Completed diflucan..skin looks good

## 2014-12-17 NOTE — Telephone Encounter (Signed)
  Oncology Nurse Navigator Documentation   Navigator Encounter Type: Other (12/17/14 1500) Patient Visit Type: Follow-up (12/17/14 1500)     Spoke with patient, informed him:  Per Garald Balding in consult with Dr. Isidore Moos, rescheduling of cancelled MBSS not needed.  He can expect a call from [facility] with [facility] re scheduling of lymphedema PT. He verbalized understanding of information provided.  Gayleen Orem, RN, BSN, [place] at [place] [phone]                   Time Spent with Patient: 15 (12/17/14 1500)

## 2014-12-17 NOTE — Telephone Encounter (Signed)
  Oncology Nurse Navigator Documentation   Navigator Encounter Type: Other (12/17/14 1447)         Interventions: Coordination of Care (12/17/14 1447)     Spoke with Kalman Shan, [place], requested scheduling of PT lymphedema per Dr. Lanell Persons 10/31/14 referral.  She verbalized understanding, will call patient to schedule.  Gayleen Orem, RN, BSN, [place] at [place] [phone]           Time Spent with Patient: 15 (12/17/14 1447)

## 2014-12-18 ENCOUNTER — Ambulatory Visit
Admission: RE | Admit: 2014-12-18 | Discharge: 2014-12-18 | Disposition: A | Discharge status: Home / Self Care | Payer: Medicare Other | Source: Ambulatory Visit | Attending: Radiation Oncology | Admitting: Radiation Oncology

## 2014-12-18 ENCOUNTER — Ambulatory Visit (HOSPITAL_COMMUNITY): Payer: Self-pay | Admitting: Dentistry

## 2014-12-18 ENCOUNTER — Encounter (HOSPITAL_COMMUNITY): Payer: Self-pay | Admitting: Dentistry

## 2014-12-18 VITALS — BP 125/55 | HR 62 | Temp 98.1°F | Wt 146.0 lb

## 2014-12-18 DIAGNOSIS — K117 Disturbances of salivary secretion: Secondary | ICD-10-CM

## 2014-12-18 DIAGNOSIS — R1310 Dysphagia, unspecified: Secondary | ICD-10-CM

## 2014-12-18 DIAGNOSIS — C099 Malignant neoplasm of tonsil, unspecified: Secondary | ICD-10-CM | POA: Diagnosis not present

## 2014-12-18 DIAGNOSIS — R682 Dry mouth, unspecified: Secondary | ICD-10-CM

## 2014-12-18 DIAGNOSIS — Z510 Encounter for antineoplastic radiation therapy: Secondary | ICD-10-CM | POA: Diagnosis not present

## 2014-12-18 DIAGNOSIS — R432 Parageusia: Secondary | ICD-10-CM

## 2014-12-18 DIAGNOSIS — Z0189 Encounter for other specified special examinations: Secondary | ICD-10-CM

## 2014-12-18 DIAGNOSIS — R252 Cramp and spasm: Secondary | ICD-10-CM

## 2014-12-18 NOTE — Patient Instructions (Signed)
RECOMMENDATIONS: 1. Brush after meals and at bedtime.  Use fluoride at bedtime. 2. Use trismus exercises as directed. 3. Use Biotene Rinse or salt water/baking soda rinses. 4. Multiple sips of water as needed. 5. Return to clinic in two months for oral exam after radiation therapy. Call if problems before then.  Shakiya Mcneary F. Alen Matheson, DDS   

## 2014-12-18 NOTE — Progress Notes (Signed)
12/18/2014  Patient Name:   Kevin Mcclain Date of Birth:   1945-10-05 Medical Record Number: [id number]  BP 125/55 mmHg  Pulse 62  Temp(Src) 98.1 F (36.7 C) (Oral)  Wt 146 lb (66.225 kg)  Kevin Mcclain presents for oral examination during radiation therapy. Patient has completed 12/25 radiation treatments. No chemotherapy.  REVIEW OF CHIEF COMPLAINTS:  DRY MOUTH: Yes HARD TO SWALLOW: Yes  HURT TO SWALLOW: Yes TASTE CHANGES: Minimal taste remains. SORES IN MOUTH: Denies TRISMUS: Patient has some trismus symptoms with loss of MIO to 30 mm. Patient encouraged to use trismus exercises twice a day. WEIGHT: 146 pounds  HOME OH REGIMEN:  BRUSHING: 3 times a day. FLOSSING: At bedtime RINSING: Using salt water baking soda rinses. FLUORIDE: Using fluoride at bedtime. TRISMUS EXERCISES:  Maximum interincisal opening: 30 mm.   DENTAL EXAM:  Oral Hygiene:(PLAQUE): Good oral hygiene LOCATION OF MUCOSITIS: Back of throat is erythematous. Some ulceration of the right buccal mucosa. DESCRIPTION OF SALIVA: Decreased saliva. Foamy saliva. Mild xerostomia. ANY EXPOSED BONE: None noted OTHER WATCHED AREAS: Previous extraction sites. DX: Xerostomia, Dysgeusia, Dysphagia, Odynophagia, Weight Loss, Trimus and Mucositis  RECOMMENDATIONS: 1. Brush after meals and at bedtime.  Use fluoride at bedtime. 2. Use trismus exercises as directed. 3. Use Biotene Rinse or salt water/baking soda rinses. 4. Multiple sips of water as needed. 5. Return to clinic in two months for oral exam after radiation therapy. Call if problems before then.  Lenn Cal, DDS

## 2014-12-19 ENCOUNTER — Telehealth: Payer: Self-pay | Admitting: *Deleted

## 2014-12-19 ENCOUNTER — Ambulatory Visit
Admission: RE | Admit: 2014-12-19 | Discharge: 2014-12-19 | Disposition: A | Discharge status: Home / Self Care | Payer: Medicare Other | Source: Ambulatory Visit | Attending: Radiation Oncology | Admitting: Radiation Oncology

## 2014-12-19 ENCOUNTER — Ambulatory Visit: Payer: Medicare Other | Admitting: Physical Therapy

## 2014-12-19 DIAGNOSIS — Z510 Encounter for antineoplastic radiation therapy: Secondary | ICD-10-CM | POA: Diagnosis not present

## 2014-12-19 NOTE — Telephone Encounter (Signed)
  Oncology Nurse Navigator Documentation   Navigator Encounter Type: Telephone (12/19/14 1658) Patient Visit Type: Follow-up (12/19/14 1658)       Interventions: Other (12/19/14 1658)     Called patient in follow-up to call received from [facility], [place], earlier today.   Butch Penny indicated patient with wife arrived for appt, expressed uncertainty/concern about why appt had been made, left before she saw him.   I LVMM for patient indicating I would see him tomorrow morning when he arrives for tomo tmt to address his questions/concerns.  Gayleen Orem, RN, BSN, [place] at [place] [phone]           Time Spent with Patient: 15 (12/19/14 1658)

## 2014-12-20 ENCOUNTER — Encounter: Payer: Self-pay | Admitting: *Deleted

## 2014-12-20 ENCOUNTER — Ambulatory Visit
Admission: RE | Admit: 2014-12-20 | Discharge: 2014-12-20 | Disposition: A | Discharge status: Home / Self Care | Payer: Medicare Other | Source: Ambulatory Visit | Attending: Radiation Oncology | Admitting: Radiation Oncology

## 2014-12-20 DIAGNOSIS — Z510 Encounter for antineoplastic radiation therapy: Secondary | ICD-10-CM | POA: Diagnosis not present

## 2014-12-20 NOTE — Progress Notes (Signed)
  Oncology Nurse Navigator Documentation   Navigator Encounter Type: Treatment (12/20/14 0825) Patient Visit Type: Radonc (12/20/14 0825)     Met with patient and his wife to address their concerns when they arrived for [facility] appt. They verbalized frustration that:  Insurance coverage had not been verified prior to visit.  Needed to wait for scheduled appt (they arrived early). They expressed appreciation for my listening, noted they will call to reschedule appt.  Gayleen Orem, RN, BSN, [place] at [place] [phone]                   Time Spent with Patient: 15 (12/20/14 0825)

## 2014-12-21 ENCOUNTER — Ambulatory Visit
Admission: RE | Admit: 2014-12-21 | Discharge: 2014-12-21 | Disposition: A | Discharge status: Home / Self Care | Payer: Medicare Other | Source: Ambulatory Visit | Attending: Radiation Oncology | Admitting: Radiation Oncology

## 2014-12-21 DIAGNOSIS — Z510 Encounter for antineoplastic radiation therapy: Secondary | ICD-10-CM | POA: Diagnosis not present

## 2014-12-24 ENCOUNTER — Telehealth: Payer: Self-pay | Admitting: *Deleted

## 2014-12-24 ENCOUNTER — Ambulatory Visit
Admission: RE | Admit: 2014-12-24 | Discharge: 2014-12-24 | Disposition: A | Discharge status: Home / Self Care | Payer: Medicare Other | Source: Ambulatory Visit | Attending: Radiation Oncology | Admitting: Radiation Oncology

## 2014-12-24 ENCOUNTER — Encounter: Payer: Self-pay | Admitting: Radiation Oncology

## 2014-12-24 VITALS — BP 154/67 | HR 85 | Temp 98.3°F | Resp 20 | Wt 142.8 lb

## 2014-12-24 DIAGNOSIS — C099 Malignant neoplasm of tonsil, unspecified: Secondary | ICD-10-CM

## 2014-12-24 DIAGNOSIS — Z510 Encounter for antineoplastic radiation therapy: Secondary | ICD-10-CM | POA: Diagnosis not present

## 2014-12-24 MED ORDER — PROMETHAZINE HCL 25 MG PO TABS: 25.0000 mg | ORAL_TABLET | Freq: Four times a day (QID) | ORAL | Status: DC | PRN

## 2014-12-24 MED ORDER — FENTANYL 25 MCG/HR TD PT72: MEDICATED_PATCH | TRANSDERMAL | Status: DC

## 2014-12-24 MED ORDER — SENNA 8.6 MG PO TABS: 1.0000 | ORAL_TABLET | Freq: Every evening | ORAL | Status: DC | PRN

## 2014-12-24 NOTE — Progress Notes (Signed)
   Weekly Management Note:  Outpatient    ICD-9-CM ICD-10-CM   1. 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  CBCT/MVCT images/Port film x-rays were reviewed.  The chart was checked. Increased mouth /throat pain. Didn't fill Duragesic yet. 7/10 intensity of pain.  Nauseous.  Drinking a lot of water, losing some weight. No constipation.   Physical Findings:  Wt Readings from Last 3 Encounters:  12/24/14 142 lb 12.8 oz (64.774 kg)  12/18/14 146 lb (66.225 kg)  12/17/14 146 lb 9.6 oz (66.497 kg)    weight is 142 lb 12.8 oz (64.774 kg). His oral temperature is 98.3 F (36.8 C). His blood pressure is 154/67 and his pulse is 85. His respiration is 20.  NAD, oropharynx and skin erythematous, skin intact, no thrush.  CBC    Component Value Date/Time   WBC 9.0 11/14/2014 0708   RBC 4.70 11/14/2014 0708   HGB 14.7 11/14/2014 0708   HCT 42.0 11/14/2014 0708   PLT 187 11/14/2014 0708   MCV 89.4 11/14/2014 0708   MCH 31.3 11/14/2014 0708   MCHC 35.0 11/14/2014 0708   RDW 13.3 11/14/2014 0708   LYMPHSABS 3.8 12/04/2013 0911   MONOABS 0.9 12/04/2013 0911   EOSABS 0.3 12/04/2013 0911   BASOSABS 0.1 12/04/2013 0911     CMP     Component Value Date/Time   NA 138 11/14/2014 0708   K 3.6 11/14/2014 0708   CL 106 11/14/2014 0708   CO2 21* 11/14/2014 0708   GLUCOSE 131* 11/14/2014 0708   BUN 12.8 11/23/2014 1013   BUN 13 11/14/2014 0708   CREATININE 0.8 11/23/2014 1013   CREATININE 0.93 11/14/2014 0708   CREATININE 0.81 09/19/2010 1339   CALCIUM 9.5 11/14/2014 0708   PROT 7.3 12/04/2013 0911   ALBUMIN 4.5 12/04/2013 0911   AST 25 12/04/2013 0911   ALT 28 12/04/2013 0911   ALKPHOS 64 12/04/2013 0911   BILITOT 1.0 12/04/2013 0911   GFRNONAA >60 11/14/2014 0708   GFRAA >60 11/14/2014 0708     Impression:  The patient is tolerating radiotherapy.   Plan:  Continue radiotherapy as planned. Patient hasn't tried Duragesic yet.  Will boost starting dose to 25 mcg.  Senna prn constipation - new Rx given.   Phenergan prn nausea. Refer again to nutritionist.     -----------------------------------  Eppie Gibson, MD

## 2014-12-24 NOTE — Progress Notes (Signed)
Weekly rad txs  Right tonsil, tongue still white coated stated Diflucan didn't help, thick saliva, nauseated , using carafte which makes him nauseated as well,  Doesn't have the sonkot didn't go through to pharmacy stated, eating verl little,suggested glucerna , pain in throat 7/10 feels har knot in throat as well, energy level poor , zofran makes him constipated doesn;t have a rx for nausea,  Lost 2 lbs last week, needs to get with [facility]  8:32 AM Wt Readings from Last 3 Encounters:  12/24/14 142 lb 12.8 oz (64.774 kg)  12/18/14 146 lb (66.225 kg)  12/17/14 146 lb 9.6 oz (66.497 kg)   8:32 AM

## 2014-12-24 NOTE — Telephone Encounter (Signed)
Called patient to inform of appt. With Kevin Mcclain on 12-26-14 @ 9 am, spoke with patient's wife- [name] and she is aware of this appt.

## 2014-12-25 ENCOUNTER — Ambulatory Visit
Admission: RE | Admit: 2014-12-25 | Discharge: 2014-12-25 | Disposition: A | Discharge status: Home / Self Care | Payer: Medicare Other | Source: Ambulatory Visit | Attending: Radiation Oncology | Admitting: Radiation Oncology

## 2014-12-25 DIAGNOSIS — Z510 Encounter for antineoplastic radiation therapy: Secondary | ICD-10-CM | POA: Diagnosis not present

## 2014-12-26 ENCOUNTER — Ambulatory Visit
Admission: RE | Admit: 2014-12-26 | Discharge: 2014-12-26 | Disposition: A | Discharge status: Home / Self Care | Payer: Medicare Other | Source: Ambulatory Visit | Attending: Radiation Oncology | Admitting: Radiation Oncology

## 2014-12-26 ENCOUNTER — Ambulatory Visit: Payer: Medicare Other | Admitting: Nutrition

## 2014-12-26 DIAGNOSIS — Z510 Encounter for antineoplastic radiation therapy: Secondary | ICD-10-CM | POA: Diagnosis not present

## 2014-12-26 NOTE — Progress Notes (Signed)
69 year old male diagnosed with tonsil cancer receiving radiation therapy  Past medical history includes depression, arthritis, diabetes, CAD, hypertension, hyperlipidemia, gout, anxiety, and GERD.  Medications include Lipitor, Glucophage, Phenergan, and Carafate.  Labs include glucose of 131 on September 14.  Height: 63 inches. Weight: 142.8 pounds on October 24. Usual body weight: 155 pounds September 2016. BMI: 25.3.  Patient reports he is lactose intolerant. He is able to consume some soft foods such as oatmeal and scrambled egg He consumes Glucerna 3 times a day. Diet is mostly liquids at this time. Patient has nausea secondary to thick saliva. Patient endorses approximately 12 pound weight loss. Patient reports this is treatment 14 out of 35.  Estimated nutrition needs: 1950-2150 calories, 90-100 grams protein, 2.2 L fluid.  Nutrition diagnosis: Unintended weight loss related to inadequate oral intake as evidenced by 8% weight loss in about 6 weeks.  Patient meets criteria for severe malnutrition in the context of chronic illness secondary to greater than 5% weight loss over one month and less than 75% energy intake for greater than one month.  Intervention: Educated patient on strategies for increasing soft smooth and moist foods for increased calories and protein. Recommended patient change from Glucerna to Ensure Plus or boost plus for additional calories and protein. Educated patient he requires 6 bottles Ensure Plus daily if he is unable to consume any food Provided samples of Unjury protein powder. Recommended 1 packet daily to provide additional 100 cal and 20 g protein Educated patient on strategies for thinning down thick secretions. Provided fact sheets and recipes along with samples of Ensure Plus and boost plus.  Also provided coupons. Questions were answered.  Teach back method was used.  Contact information was provided.  Monitoring, evaluation, goals: Patient  will consume adequate calories and protein for minimal weight loss. Recommend feeding tube placement to provide adequate nutrition and hydration throughout remaining treatment as well as support healing after treatment.  Next visit: Patient will contact me for questions or concerns.  **Disclaimer: This note was dictated with voice recognition software. Similar sounding words can inadvertently be transcribed and this note may contain transcription errors which may not have been corrected upon publication of note.**

## 2014-12-27 ENCOUNTER — Encounter: Payer: Self-pay | Admitting: *Deleted

## 2014-12-27 ENCOUNTER — Ambulatory Visit
Admission: RE | Admit: 2014-12-27 | Discharge: 2014-12-27 | Disposition: A | Discharge status: Home / Self Care | Payer: Medicare Other | Source: Ambulatory Visit | Attending: Radiation Oncology | Admitting: Radiation Oncology

## 2014-12-27 DIAGNOSIS — Z510 Encounter for antineoplastic radiation therapy: Secondary | ICD-10-CM | POA: Diagnosis not present

## 2014-12-27 NOTE — Progress Notes (Signed)
  Oncology Nurse Navigator Documentation   Navigator Encounter Type: Treatment (12/27/14 0825) Patient Visit Type: Radonc (12/27/14 0825) Treatment Phase: Treatment (12/27/14 0825)     To provide support and encouragement, care continuity and to assess for needs, met with patient after daily Tomo. His wife accompanied him. He reported:  Mgt of throat pain - Improved with 25 mcg Fentanyl started earlier this week; taking Norco couple times daily for breakthrough pain.  Using Lidocaine and Carafate occasionally but he states they are not very helpful.  Doing the best he can to reach daily goal of 2000 calories per [facility]'s guidance but its a challenge to eat when unable to taste.  Using salt water/baking soda rinse for management of thickened saliva. Both expressed challenge of trying to explain to family members what patient is experiencing, what they can do to support.  I offered to speak with family as needed. They understand I am available to them.  Gayleen Orem, RN, BSN, [place] at [place] [phone]                      Time Spent with Patient: 30 (12/27/14 0825)

## 2014-12-28 ENCOUNTER — Ambulatory Visit
Admission: RE | Admit: 2014-12-28 | Discharge: 2014-12-28 | Disposition: A | Discharge status: Home / Self Care | Payer: Medicare Other | Source: Ambulatory Visit | Attending: Radiation Oncology | Admitting: Radiation Oncology

## 2014-12-28 DIAGNOSIS — Z510 Encounter for antineoplastic radiation therapy: Secondary | ICD-10-CM | POA: Diagnosis not present

## 2014-12-30 ENCOUNTER — Ambulatory Visit: Payer: Medicare Other

## 2014-12-31 ENCOUNTER — Ambulatory Visit
Admission: RE | Admit: 2014-12-31 | Discharge: 2014-12-31 | Disposition: A | Discharge status: Home / Self Care | Payer: Medicare Other | Source: Ambulatory Visit | Attending: Radiation Oncology | Admitting: Radiation Oncology

## 2014-12-31 ENCOUNTER — Ambulatory Visit: Payer: Medicare Other

## 2014-12-31 ENCOUNTER — Encounter: Payer: Self-pay | Admitting: Radiation Oncology

## 2014-12-31 VITALS — BP 132/66 | HR 73 | Temp 98.0°F | Ht 63.0 in | Wt 142.8 lb

## 2014-12-31 DIAGNOSIS — Z510 Encounter for antineoplastic radiation therapy: Secondary | ICD-10-CM | POA: Diagnosis not present

## 2014-12-31 DIAGNOSIS — C099 Malignant neoplasm of tonsil, unspecified: Secondary | ICD-10-CM | POA: Insufficient documentation

## 2014-12-31 MED ORDER — FENTANYL 12 MCG/HR TD PT72: 12.5000 ug | MEDICATED_PATCH | TRANSDERMAL | Status: DC

## 2014-12-31 MED ORDER — SONAFINE EX EMUL
1.0000 "application " | Freq: Once | CUTANEOUS | Status: AC
  Administered 2014-12-31: 1 via TOPICAL
  Filled 2014-12-31: qty 45

## 2014-12-31 MED ORDER — HYDROCODONE-ACETAMINOPHEN 5-325 MG PO TABS: 1.0000 | ORAL_TABLET | ORAL | Status: DC | PRN

## 2014-12-31 MED ORDER — FENTANYL 25 MCG/HR TD PT72: MEDICATED_PATCH | TRANSDERMAL | Status: DC

## 2014-12-31 MED ORDER — SONAFINE EX EMUL: 1.0000 "application " | Freq: Two times a day (BID) | CUTANEOUS | Status: DC

## 2014-12-31 NOTE — Addendum Note (Signed)
Encounter addended by: Ernst Spell, RN on: 12/31/2014 10:08 AM<BR>     Documentation filed: Inpatient Document Flowsheet

## 2014-12-31 NOTE — Progress Notes (Signed)
   Weekly Management Note:  Outpatient    ICD-9-CM ICD-10-CM   1. Tonsil cancer ([facility]) 146.0 C09.9 HYDROcodone-acetaminophen (NORCO/VICODIN) 5-325 MG tablet     fentaNYL (DURAGESIC - DOSED MCG/HR) 25 MCG/HR patch     fentaNYL (DURAGESIC - DOSED MCG/HR) 12 MCG/HR    Current Dose: 42 Gy  Projected Dose: 70 Gy   Narrative:  The patient presents for routine under treatment assessment.  CBCT/MVCT images/Port film x-rays were reviewed.  The chart was checked.  Pain is 7/10 but meds help.  Not sure if wanting to increase doses yet.  Thick yellow saliva in AM. Anti emetics help nausea.  Pushing PO intake conscientiously   Orthostatics checked  Physical Findings:  Wt Readings from Last 3 Encounters:  12/31/14 142 lb 12.8 oz (64.774 kg)  12/24/14 142 lb 12.8 oz (64.774 kg)  12/18/14 146 lb (66.225 kg)    height is 5\' 3"  (1.6 m) and weight is 142 lb 12.8 oz (64.774 kg). His temperature is 98 F (36.7 C). His blood pressure is 133/51 and his pulse is 63.  NAD, oropharynx and skin erythematous with confluent mucositis, skin over neck is dry, no thrush.  CBC    Component Value Date/Time   WBC 9.0 11/14/2014 0708   RBC 4.70 11/14/2014 0708   HGB 14.7 11/14/2014 0708   HCT 42.0 11/14/2014 0708   PLT 187 11/14/2014 0708   MCV 89.4 11/14/2014 0708   MCH 31.3 11/14/2014 0708   MCHC 35.0 11/14/2014 0708   RDW 13.3 11/14/2014 0708   LYMPHSABS 3.8 12/04/2013 0911   MONOABS 0.9 12/04/2013 0911   EOSABS 0.3 12/04/2013 0911   BASOSABS 0.1 12/04/2013 0911     CMP     Component Value Date/Time   NA 138 11/14/2014 0708   K 3.6 11/14/2014 0708   CL 106 11/14/2014 0708   CO2 21* 11/14/2014 0708   GLUCOSE 131* 11/14/2014 0708   BUN 12.8 11/23/2014 1013   BUN 13 11/14/2014 0708   CREATININE 0.8 11/23/2014 1013   CREATININE 0.93 11/14/2014 0708   CREATININE 0.81 09/19/2010 1339   CALCIUM 9.5 11/14/2014 0708   PROT 7.3 12/04/2013 0911   ALBUMIN 4.5 12/04/2013 0911   AST 25 12/04/2013 0911   ALT 28 12/04/2013 0911   ALKPHOS 64 12/04/2013 0911   BILITOT 1.0 12/04/2013 0911   GFRNONAA >60 11/14/2014 0708   GFRAA >60 11/14/2014 0708     Impression:  The patient is tolerating radiotherapy.   Plan:  Continue radiotherapy as planned.   Refills for pain meds given, may add 12 mcg patched to 25 mcg of duragesic if needed    -----------------------------------  Eppie Gibson, MD

## 2014-12-31 NOTE — Addendum Note (Signed)
Encounter addended by: Ernst Spell, RN on: 12/31/2014 12:34 PM<BR>     Documentation filed: Dx [employer], Orders

## 2014-12-31 NOTE — Addendum Note (Signed)
Encounter addended by: Margaret Pyle, [place] on: 12/31/2014 12:45 PM<BR>     Documentation filed: Rx Order Verification

## 2014-12-31 NOTE — Addendum Note (Signed)
Encounter addended by: Ernst Spell, RN on: 12/31/2014 12:44 PM<BR>     Documentation filed: Inpatient MAR

## 2014-12-31 NOTE — Progress Notes (Addendum)
Kevin Mcclain is here for his 21st fraction of radiaton to his Right Tonsil. He rates his pain a 7/10 when swallowing, and does complain of his mouth being sore. His throat feels like it is burning when he wakes up in the morning. He is fentanyl patch 58mcg and hydrocodone one to two tablets 4-5 times a day, and states he needs to take some before each meal.  His skin is red bilaterally with several dry areas noted. He is using his biafine cream and I will give him another tube today. He consuming mostly shakes, soup broth, and oatmeal if made thin. He reports drinking plenty of water. He reports his saliva is very thick and yellow in color, which at times makes him feel nausea. He rinses his mouth several times a day. His tongue is coated white and the inside of his mouth, and again he reports this area is very sore.   BP 133/51 mmHg  Pulse 63  Temp(Src) 98 F (36.7 C)  Ht 5\' 3"  (1.6 m)  Wt 142 lb 12.8 oz (64.774 kg)  BMI 25.30 kg/m2   Othostatics: BP sitting 133/51, pulse 63. Standing BP 132/66, pulse 73  Wt Readings from Last 3 Encounters:  12/31/14 142 lb 12.8 oz (64.774 kg)  12/24/14 142 lb 12.8 oz (64.774 kg)  12/18/14 146 lb (66.225 kg)

## 2015-01-01 ENCOUNTER — Ambulatory Visit: Payer: Medicare Other

## 2015-01-01 ENCOUNTER — Ambulatory Visit
Admission: RE | Admit: 2015-01-01 | Discharge: 2015-01-01 | Disposition: A | Discharge status: Home / Self Care | Payer: Medicare Other | Source: Ambulatory Visit | Attending: Radiation Oncology | Admitting: Radiation Oncology

## 2015-01-01 DIAGNOSIS — Z510 Encounter for antineoplastic radiation therapy: Secondary | ICD-10-CM | POA: Diagnosis not present

## 2015-01-02 ENCOUNTER — Ambulatory Visit
Admission: RE | Admit: 2015-01-02 | Discharge: 2015-01-02 | Disposition: A | Discharge status: Home / Self Care | Payer: Medicare Other | Source: Ambulatory Visit | Attending: Radiation Oncology | Admitting: Radiation Oncology

## 2015-01-02 ENCOUNTER — Ambulatory Visit: Payer: Medicare Other

## 2015-01-02 DIAGNOSIS — Z510 Encounter for antineoplastic radiation therapy: Secondary | ICD-10-CM | POA: Diagnosis not present

## 2015-01-02 NOTE — Progress Notes (Signed)
Kevin Mcclain came over to nursing after his radiation treatment this morning. He reports vomiting this morning multiple times. He took his phenergan dose at 0430 because of the nausea. He reported a scant amount of blood in his vomit in the beginning, and then reports it turned a greenish color. They were concerned about dehydration. His vitals were: Sitting BP 116/54, pulse 67 and standing BP 119/63, pulse 74. His temp was 98.2. I encouraged him to take his phenergan as directed, and try to keep his liquid intake up as much as possible. He voiced his understanding and know to contact Radiation Oncology for any other concerns.

## 2015-01-03 ENCOUNTER — Ambulatory Visit
Admission: RE | Admit: 2015-01-03 | Discharge: 2015-01-03 | Disposition: A | Discharge status: Home / Self Care | Payer: Medicare Other | Source: Ambulatory Visit | Attending: Radiation Oncology | Admitting: Radiation Oncology

## 2015-01-03 ENCOUNTER — Ambulatory Visit: Payer: Medicare Other

## 2015-01-03 DIAGNOSIS — Z510 Encounter for antineoplastic radiation therapy: Secondary | ICD-10-CM | POA: Diagnosis not present

## 2015-01-04 ENCOUNTER — Ambulatory Visit: Payer: Medicare Other

## 2015-01-04 ENCOUNTER — Ambulatory Visit
Admission: RE | Admit: 2015-01-04 | Discharge: 2015-01-04 | Disposition: A | Discharge status: Home / Self Care | Payer: Medicare Other | Source: Ambulatory Visit | Attending: Radiation Oncology | Admitting: Radiation Oncology

## 2015-01-04 DIAGNOSIS — Z510 Encounter for antineoplastic radiation therapy: Secondary | ICD-10-CM | POA: Diagnosis not present

## 2015-01-06 ENCOUNTER — Emergency Department (HOSPITAL_COMMUNITY)
Admission: EM | Admit: 2015-01-06 | Discharge: 2015-01-06 | Disposition: A | Discharge status: Home / Self Care | Payer: Medicare Other | Attending: Emergency Medicine | Admitting: Emergency Medicine

## 2015-01-06 ENCOUNTER — Telehealth: Payer: Self-pay | Admitting: *Deleted

## 2015-01-06 ENCOUNTER — Emergency Department (HOSPITAL_COMMUNITY): Payer: Medicare Other

## 2015-01-06 ENCOUNTER — Encounter (HOSPITAL_COMMUNITY): Payer: Self-pay | Admitting: Emergency Medicine

## 2015-01-06 DIAGNOSIS — E785 Hyperlipidemia, unspecified: Secondary | ICD-10-CM | POA: Insufficient documentation

## 2015-01-06 DIAGNOSIS — Z87442 Personal history of urinary calculi: Secondary | ICD-10-CM | POA: Insufficient documentation

## 2015-01-06 DIAGNOSIS — E119 Type 2 diabetes mellitus without complications: Secondary | ICD-10-CM | POA: Insufficient documentation

## 2015-01-06 DIAGNOSIS — I10 Essential (primary) hypertension: Secondary | ICD-10-CM | POA: Diagnosis not present

## 2015-01-06 DIAGNOSIS — Z85828 Personal history of other malignant neoplasm of skin: Secondary | ICD-10-CM | POA: Diagnosis not present

## 2015-01-06 DIAGNOSIS — F419 Anxiety disorder, unspecified: Secondary | ICD-10-CM | POA: Insufficient documentation

## 2015-01-06 DIAGNOSIS — R110 Nausea: Secondary | ICD-10-CM | POA: Insufficient documentation

## 2015-01-06 DIAGNOSIS — K219 Gastro-esophageal reflux disease without esophagitis: Secondary | ICD-10-CM | POA: Diagnosis not present

## 2015-01-06 DIAGNOSIS — M109 Gout, unspecified: Secondary | ICD-10-CM | POA: Diagnosis not present

## 2015-01-06 DIAGNOSIS — R499 Unspecified voice and resonance disorder: Secondary | ICD-10-CM | POA: Insufficient documentation

## 2015-01-06 DIAGNOSIS — Z951 Presence of aortocoronary bypass graft: Secondary | ICD-10-CM | POA: Diagnosis not present

## 2015-01-06 DIAGNOSIS — Z7982 Long term (current) use of aspirin: Secondary | ICD-10-CM | POA: Diagnosis not present

## 2015-01-06 DIAGNOSIS — R000 Tachycardia, unspecified: Secondary | ICD-10-CM | POA: Diagnosis not present

## 2015-01-06 DIAGNOSIS — F329 Major depressive disorder, single episode, unspecified: Secondary | ICD-10-CM | POA: Insufficient documentation

## 2015-01-06 DIAGNOSIS — R1310 Dysphagia, unspecified: Secondary | ICD-10-CM | POA: Insufficient documentation

## 2015-01-06 DIAGNOSIS — I2510 Atherosclerotic heart disease of native coronary artery without angina pectoris: Secondary | ICD-10-CM | POA: Diagnosis not present

## 2015-01-06 DIAGNOSIS — Z79899 Other long term (current) drug therapy: Secondary | ICD-10-CM | POA: Insufficient documentation

## 2015-01-06 DIAGNOSIS — M1990 Unspecified osteoarthritis, unspecified site: Secondary | ICD-10-CM | POA: Diagnosis not present

## 2015-01-06 LAB — CBC WITH DIFFERENTIAL/PLATELET
BASOS ABS: 0 10*3/uL (ref 0.0–0.1)
Basophils Relative: 0 %
EOS ABS: 0 10*3/uL (ref 0.0–0.7)
EOS PCT: 0 %
HCT: 43.3 % (ref 39.0–52.0)
HEMOGLOBIN: 15.2 g/dL (ref 13.0–17.0)
LYMPHS PCT: 5 %
Lymphs Abs: 0.5 10*3/uL — ABNORMAL LOW (ref 0.7–4.0)
MCH: 31 pg (ref 26.0–34.0)
MCHC: 35.1 g/dL (ref 30.0–36.0)
MCV: 88.4 fL (ref 78.0–100.0)
Monocytes Absolute: 0.3 10*3/uL (ref 0.1–1.0)
Monocytes Relative: 3 %
NEUTROS PCT: 92 %
Neutro Abs: 9.5 10*3/uL — ABNORMAL HIGH (ref 1.7–7.7)
PLATELETS: 267 10*3/uL (ref 150–400)
RBC: 4.9 MIL/uL (ref 4.22–5.81)
RDW: 12.9 % (ref 11.5–15.5)
WBC: 10.3 10*3/uL (ref 4.0–10.5)

## 2015-01-06 LAB — COMPREHENSIVE METABOLIC PANEL
ALBUMIN: 4.6 g/dL (ref 3.5–5.0)
ALK PHOS: 76 U/L (ref 38–126)
ALT: 37 U/L (ref 17–63)
ANION GAP: 17 — AB (ref 5–15)
AST: 29 U/L (ref 15–41)
BUN: 27 mg/dL — ABNORMAL HIGH (ref 6–20)
CALCIUM: 10.4 mg/dL — AB (ref 8.9–10.3)
CO2: 21 mmol/L — AB (ref 22–32)
Chloride: 100 mmol/L — ABNORMAL LOW (ref 101–111)
Creatinine, Ser: 1.02 mg/dL (ref 0.61–1.24)
GFR calc non Af Amer: >60 mL/min (ref >60)
GLUCOSE: 185 mg/dL — AB (ref 65–99)
POTASSIUM: 4.5 mmol/L (ref 3.5–5.1)
SODIUM: 138 mmol/L (ref 135–145)
Total Bilirubin: 1.8 mg/dL — ABNORMAL HIGH (ref 0.3–1.2)
Total Protein: 8 g/dL (ref 6.5–8.1)

## 2015-01-06 LAB — PROTIME-INR
INR: 1.03 (ref 0.00–1.49)
Prothrombin Time: 13.7 seconds (ref 11.6–15.2)

## 2015-01-06 LAB — CBG MONITORING, ED: GLUCOSE-CAPILLARY: 185 mg/dL — AB (ref 65–99)

## 2015-01-06 MED ORDER — IOHEXOL 300 MG/ML  SOLN
100.0000 mL | Freq: Once | INTRAMUSCULAR | Status: AC | PRN
  Administered 2015-01-06: 100 mL via INTRAVENOUS

## 2015-01-06 MED ORDER — SODIUM CHLORIDE 0.9 % IV BOLUS (SEPSIS)
1000.0000 mL | Freq: Once | INTRAVENOUS | Status: AC
Start: 2015-01-06 — End: 2015-01-06
  Administered 2015-01-06: 1000 mL via INTRAVENOUS

## 2015-01-06 MED ORDER — HYDROMORPHONE HCL 1 MG/ML IJ SOLN
1.0000 mg | Freq: Once | INTRAMUSCULAR | Status: AC
  Administered 2015-01-06: 1 mg via INTRAVENOUS
  Filled 2015-01-06: qty 1

## 2015-01-06 MED ORDER — ONDANSETRON 8 MG PO TBDP: 8.0000 mg | ORAL_TABLET | Freq: Three times a day (TID) | ORAL | Status: DC | PRN

## 2015-01-06 MED ORDER — ONDANSETRON HCL 4 MG/2ML IJ SOLN
4.0000 mg | Freq: Once | INTRAMUSCULAR | Status: AC
  Administered 2015-01-06: 4 mg via INTRAVENOUS
  Filled 2015-01-06: qty 2

## 2015-01-06 NOTE — ED Notes (Signed)
Patient transported to CT 

## 2015-01-06 NOTE — Discharge Instructions (Signed)

## 2015-01-06 NOTE — ED Notes (Signed)
Stuck patient x1, no blood return, RN Apolonio Schneiders K notified

## 2015-01-06 NOTE — ED Notes (Signed)
Gave pt po fluids

## 2015-01-06 NOTE — ED Notes (Signed)
MD at bedside. 

## 2015-01-06 NOTE — ED Notes (Signed)
Pt arrived with report of n/v x12 in past 24hrs, hemoptysis, and difficulty swallowing. Pt denies fever, diarrhea and abd pain. Pt currently receiving radiation therapy.

## 2015-01-06 NOTE — ED Provider Notes (Signed)
CSN: [id number]     Arrival date & time 01/06/15  1146 History   First MD Initiated Contact with Patient 01/06/15 1319     Chief Complaint  Patient presents with  . Dysphagia     (Consider location/radiation/quality/duration/timing/severity/associated sxs/prior Treatment) The history is provided by the patient.   patient with nausea hemoptysis sore throat and difficulty swallowing. 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 Over last couple days he has had increased typically swallowing. States he can not drink liquids and come right back up. Unable to swallow his pills. No fevers. States he's had a little blood coming up also. No difficulty breathing.  Past Medical History  Diagnosis Date  . Depression   . Arthritis   . Diabetes mellitus type II   . Kidney stones   . CAD (coronary artery disease)     severe CAD-s/p MI 2000 with PTCA, s/p CABG 2005-by Dr Vanetta Mulders at [facility] EF 50-55%  . Hypertension   . Hyperlipidemia   . Gout   . Vertigo   . Cancer ([facility]) 10/12/2014    right tonsil=squamous cell  carcinaoma  . Allergy   . Anxiety     anxious about radiation  . GERD (gastroesophageal reflux disease)     zantac- prn   Past Surgical History  Procedure Laterality Date  . Colonoscopy    . Coronary artery bypass graft  2005    6 grafts  . Cholecystectomy  2012  . Colonoscopy w/ polypectomy  2015  . Cystoscopy      kidney stone removal  . Lithotripsy    . Nasal sinus surgery Right 11/14/2014    Procedure: RIGHT ENDOSCOPIC ANTROSTOMY AND ANTERIOR ETHMOIDECTOMY ;  Surgeon: Jodi Marble, MD;  Location: [facility];  Service: ENT;  Laterality: Right;  . Multiple extractions with alveoloplasty N/A 11/14/2014    Procedure: Extraction of tooth #2 and 31 with alveoloplasty after sectioning of bridge at distal #29 with full mouth debridement of remaining dention;  Surgeon: Lenn Cal, DDS;  Location: [facility];  Service: Oral Surgery;   Laterality: N/A;   Family History  Problem Relation Age of Onset  . Heart attack Mother     deceased age 20 secondary  . Heart disease Mother   . Colon cancer Father   . Stroke Father     deceased age 37  . Heart disease Father   . Cancer Father     colon  . Stroke Sister 10  . Heart disease Sister   . Coronary artery disease Brother     s/p bypass surgery  . Heart disease Brother   . Heart disease Brother    Social History  Substance Use Topics  . Smoking status: Never Smoker   . Smokeless tobacco: Never Used  . Alcohol Use: No    Review of Systems  Constitutional: Negative for activity change, appetite change and unexpected weight change.  HENT: Positive for trouble swallowing and voice change.   Eyes: Negative for pain.  Respiratory: Negative for chest tightness and shortness of breath.   Cardiovascular: Negative for chest pain and leg swelling.  Gastrointestinal: Positive for nausea. Negative for vomiting, abdominal pain and diarrhea.  Endocrine: Negative for polyphagia.  Genitourinary: Negative for flank pain.  Musculoskeletal: Negative for back pain and neck stiffness.  Skin: Negative for rash.  Neurological: Negative for weakness, numbness and headaches.  Psychiatric/Behavioral: Negative for behavioral problems.      Allergies  Benzonatate; Codeine; and Sitagliptin phosphate  Home Medications   Prior to Admission medications   Medication Sig Start Date End Date Taking? Authorizing Provider  allopurinol (ZYLOPRIM) 300 MG tablet TAKE 1 TABLET BY MOUTH EVERY DAY 10/11/14  Yes Doe-Hyun R Shawna Orleans, DO  amLODipine (NORVASC) 10 MG tablet TAKE 1 TABLET BY MOUTH EVERY DAY 10/11/14  Yes Doe-Hyun Kyra Searles, DO  aspirin EC 325 MG tablet Take 325 mg by mouth daily.   Yes Historical Provider, MD  atorvastatin (LIPITOR) 20 MG tablet TAKE 1 TABLET BY MOUTH EVERY DAY 09/14/14  Yes Doe-Hyun Kyra Searles, DO  colchicine 0.6 MG tablet Take 1 tablet (0.6 mg total) by mouth daily as  needed. Patient taking differently: Take 0.6 mg by mouth daily as needed (gout flare).  10/10/14  Yes Doe-Hyun R Shawna Orleans, DO  fentaNYL (DURAGESIC - DOSED MCG/HR) 12 MCG/HR Place 1 patch (12.5 mcg total) onto the skin every 3 (three) days. ADD to 11mcg patch if needing more pain control 12/31/14  Yes Eppie Gibson, MD  fentaNYL (DURAGESIC - DOSED MCG/HR) 25 MCG/HR patch Apply 1 patch q 3 days to skin. 12/31/14  Yes Eppie Gibson, MD  HYDROcodone-acetaminophen (NORCO/VICODIN) 5-325 MG tablet Take 1-2 tablets by mouth every 4 (four) hours as needed for moderate pain. 12/31/14  Yes Eppie Gibson, MD  lidocaine (XYLOCAINE) 2 % solution Mix 1 part 2%viscous lidocaine,1part H2O.Swish and/or swallow 59ml of this mixture,54min before meals and at bedtime, up to QID 12/10/14  Yes Eppie Gibson, MD  metFORMIN (GLUCOPHAGE) 500 MG tablet TAKE 1 TABLET BY MOUTH 3 TIMES A DAY Patient taking differently: TAKE 1 TABLET BY MOUTH WITH BREAKFAST AND TAKE 2 TABLETS BY MOUTH AT 9PM 10/11/14  Yes Doe-Hyun R Shawna Orleans, DO  metoprolol succinate (TOPROL-XL) 50 MG 24 hr tablet TAKE 1 TABLET BY MOUTH DAILY. TAKE WITH OR IMMEDIATELY FOLLOWING A MEAL Patient taking differently: TAKE 1 TABLET BY MOUTH DAILY WITH BREAKFAST 10/11/14  Yes Doe-Hyun R Shawna Orleans, DO  nitroGLYCERIN (NITROSTAT) 0.4 MG SL tablet Place 1 tablet (0.4 mg total) under the tongue every 5 (five) minutes as needed. 04/18/14 05/16/16 Yes Doe-Hyun R Shawna Orleans, DO  OVER THE COUNTER MEDICATION Place 1 drop into both eyes 3 (three) times daily as needed (dry eyes/ irritation). Clear Cooling Eye Drops   Yes Historical Provider, MD  ramipril (ALTACE) 5 MG capsule TAKE 2 CAPSULES BY MOUTH EVERY DAY 10/11/14  Yes Doe-Hyun R Shawna Orleans, DO  ranitidine (ZANTAC) 75 MG tablet Take 75 mg by mouth daily as needed for heartburn (acid reflux).    Yes Historical Provider, MD  senna (SENOKOT) 8.6 MG TABS tablet Take 1 tablet (8.6 mg total) by mouth at bedtime as needed for mild constipation. 12/24/14  Yes Eppie Gibson, MD   sodium fluoride (FLUORISHIELD) 1.1 % GEL dental gel Instill one drop of gel per tooth space of fluoride tray. Place over teeth for 5 minutes. Remove. Spit out excess. Repeat nightly. 11/20/14  Yes Lenn Cal, DDS  sucralfate (CARAFATE) 1 G tablet Dissolve 1 tablet in 71ml H2O and swallow up to QID,PRN sore throat. 12/10/14  Yes Eppie Gibson, MD  desoximetasone (TOPICORT) 0.25 % cream APPLY TOPICALLY TWICE DAILY Patient not taking: Reported on 12/24/2014 04/18/14   Doe-Hyun R Shawna Orleans, DO  ondansetron (ZOFRAN-ODT) 8 MG disintegrating tablet Take 1 tablet (8 mg total) by mouth every 8 (eight) hours as needed for nausea or vomiting. 01/06/15   Davonna Belling, MD  promethazine (PHENERGAN) 25 MG tablet Take 1 tablet (25 mg  total) by mouth every 6 (six) hours as needed for nausea or vomiting. Patient not taking: Reported on 01/06/2015 12/24/14   Eppie Gibson, MD  sulindac (CLINORIL) 200 MG tablet Take 0.5 tablets (100 mg total) by mouth daily as needed. Patient not taking: Reported on 01/06/2015 12/04/13   Doe-Hyun R Shawna Orleans, DO   BP 120/55 mmHg  Pulse 98  Temp(Src) 98.4 F (36.9 C) (Oral)  Resp 20  SpO2 98% Physical Exam  Constitutional: He appears well-developed.  HENT:  Head: Atraumatic.  Erythema of the posterior pharynx. No stridor. Difficult examination due to patient's difficulty opening his jaw.  Eyes: EOM are normal.  Neck: Neck supple.  Cardiovascular:  Tachycardia  Abdominal: Soft.  Musculoskeletal: Normal range of motion.  Neurological: He is alert.  Skin: Skin is warm.    ED Course  Procedures (including critical care time) Labs Review Labs Reviewed  COMPREHENSIVE METABOLIC PANEL - Abnormal; Notable for the following:    Chloride 100 (*)    CO2 21 (*)    Glucose, Bld 185 (*)    BUN 27 (*)    Calcium 10.4 (*)    Total Bilirubin 1.8 (*)    Anion gap 17 (*)    All other components within normal limits  CBC WITH DIFFERENTIAL/PLATELET - Abnormal; Notable for the following:     Neutro Abs 9.5 (*)    Lymphs Abs 0.5 (*)    All other components within normal limits  CBG MONITORING, ED - Abnormal; Notable for the following:    Glucose-Capillary 185 (*)    All other components within normal limits  PROTIME-INR    Imaging Review Ct Soft Tissue Neck W Contrast  01/06/2015  CLINICAL DATA:  Right palatine tonsil cancer diagnosed in August 2016 status post ongoing radiation therapy. Patient presents with nausea, vomiting, hemoptysis and dysphagia. EXAM: CT NECK WITH CONTRAST TECHNIQUE: Multidetector CT imaging of the neck was performed using the standard protocol following the bolus administration of intravenous contrast. CONTRAST:  164mL OMNIPAQUE IOHEXOL 300 MG/ML  SOLN COMPARISON:  11/06/2014 neck CT.  10/29/2014 PET-CT. FINDINGS: Pharynx and larynx: There has been a significant interval treatment response in the right palatine tonsil tumor, with no residual measurable right palatine tonsil mass. Mild mucosal thickening and hyperenhancement in the bilateral oropharynx and epiglottis, slightly asymmetric to the right, is likely treatment related. Unremarkable nasopharynx. No laryngeal mass. No retropharyngeal or peritonsillar fluid collection. Salivary glands: Unremarkable . Thyroid: Unremarkable.  No thyroid nodules. Lymph nodes: No pathologically enlarged cervical nodes. Vascular: Intracranial and extracranial carotid atherosclerosis bilaterally, with no significant proximal internal carotid artery stenoses. Limited intracranial: No acute abnormality. Visualized orbits: Intact . Mastoids and visualized paranasal sinuses: Prominent mucosal thickening throughout the right maxillary sinus with decreased opacification of the right maxillary sinus. Status post right maxillary antrostomy. Otherwise essentially clear visualized paranasal sinuses. Mild partial opacification of the visualized right mastoid air cells. On opacified visualized left mastoid air cells. Skeleton: No aggressive  appearing focal osseous lesions. Visualized median sternotomy wires are intact. Upper chest: No acute consolidative airspace disease, significant pulmonary nodules or lung masses. IMPRESSION: 1. Significant interval treatment response with no residual measurable right palatine tonsil mass. 2. Mild mucosal thickening and hyperenhancement in the bilateral oropharynx and epiglottis, slightly asymmetric to the right, likely treatment related. No retropharyngeal or peritonsillar fluid collection. 3. No cervical adenopathy. 4. Persistent prominent right maxillary sinus mucosal thickening with decreased right maxillary sinus opacification. Status post right maxillary antrostomy. 5. New mild partial opacification of  the right mastoid air cells, nonspecific. Electronically Signed   By: Ilona Sorrel M.D.   On: 01/06/2015 16:14   I have personally reviewed and evaluated these images and lab results as part of my medical decision-making.   EKG Interpretation None      MDM   Final diagnoses:  Dysphagia    Patient with difficult swallowing. Has had tonsillar cancer is on radiation. Has been having difficulty swallowing. Unable to handle secretions CT scans reassuring however and patient has tolerated orals after IV Zofran. Patient feels better on be discharged home.    Davonna Belling, MD 01/06/15 [id number]

## 2015-01-06 NOTE — Telephone Encounter (Signed)
  Oncology Nurse Navigator Documentation   Navigator Encounter Type: Telephone (01/06/15 4982)         Interventions: Coordination of Care (01/06/15 0934)     Patient's wife [name] called.  She reported Kevin Mcclain:  Has taken in minimal fluid (about 1/2 cup water over last 2 days) and has had about 300 calories of supplement since Friday.  This is due to swallowing difficulty, N&V r/t thickened saliva.    Is passing minimal urine that is "normal" in color.  Is weak but denies being light headed.  I asked her to check CBG, she later called back to report 127. I suggested she call [facility] [phone], ask to speak with RadOnc MD on call:  Report the above.  Note that patient sees Dr. Isidore Moos tomorrow morning for WUT after 0800 [place] tmt. I will folllow-up with patient tomorrow.  Gayleen Orem, RN, BSN, [facility] at [facility] [phone]           Time Spent with Patient: 15 (01/06/15 0934)

## 2015-01-07 ENCOUNTER — Ambulatory Visit
Admission: RE | Admit: 2015-01-07 | Discharge: 2015-01-07 | Disposition: A | Discharge status: Home / Self Care | Payer: Medicare Other | Source: Ambulatory Visit | Attending: Radiation Oncology | Admitting: Radiation Oncology

## 2015-01-07 ENCOUNTER — Telehealth: Payer: Self-pay

## 2015-01-07 ENCOUNTER — Encounter: Payer: Self-pay | Admitting: *Deleted

## 2015-01-07 VITALS — BP 138/70 | HR 78 | Temp 98.5°F | Resp 12 | Wt 136.8 lb

## 2015-01-07 DIAGNOSIS — Z510 Encounter for antineoplastic radiation therapy: Secondary | ICD-10-CM | POA: Diagnosis not present

## 2015-01-07 DIAGNOSIS — C099 Malignant neoplasm of tonsil, unspecified: Secondary | ICD-10-CM

## 2015-01-07 MED ORDER — ONDANSETRON 8 MG PO TBDP: 8.0000 mg | ORAL_TABLET | Freq: Three times a day (TID) | ORAL | Status: DC | PRN

## 2015-01-07 MED ORDER — HYDROCODONE-ACETAMINOPHEN 7.5-325 MG/15ML PO SOLN: 15.0000 mL | Freq: Four times a day (QID) | ORAL | Status: DC | PRN

## 2015-01-07 MED ORDER — HYDROCODONE-ACETAMINOPHEN 7.5-325 MG/15ML PO SOLN
ORAL | Status: DC
Start: 2015-01-07 — End: 2015-01-14

## 2015-01-07 MED ORDER — FENTANYL 50 MCG/HR TD PT72: MEDICATED_PATCH | TRANSDERMAL | Status: DC

## 2015-01-07 NOTE — Telephone Encounter (Signed)
I called Kevin Mcclain and spoke to his wife regarding his appointments for IV hydration even days 11/8 through 11/18. I relayed they are at the [facility] every appointment at 9:00 except 11/12 which is at the [facility] at 9:00. She voiced her understanding of these appointments.

## 2015-01-07 NOTE — Progress Notes (Signed)
PAIN: He rates his pain as a 8 on a scale of 0-10. intermittent, burning and stabbing over throat  SWALLOWING/DIET: Pt has had dysphagia for both solids and liquids. Pt reports a difficult time eating, which has resulted in limited oral intake. Today: consumed oatmeal and a boost, 12 oz water. Oral exam reveals mild erythema, mucous membranes dry and noted slight white patches to soft palate with yellow, blood streaked and tenacious sputum.  BOWEL: Pt reports Nausea and Vomiting, a bowel movement every other day. SKIN: Skin exam reveals Dryness, Hyperpigmentation, Pruritus, dry desquamation and erythema. Pt continues to apply Biafine as directed. OTHER: Pt complains of fatigue, weakness and poor appetite.   WEIGHT/VS: Wt Readings from Last 3 Encounters:  01/07/15 136 lb 12.8 oz (62.052 kg)  12/31/14 142 lb 12.8 oz (64.774 kg)  12/24/14 142 lb 12.8 oz (64.774 kg)   BP 138/70 mmHg  Pulse 78  Temp(Src) 98.5 F (36.9 C) (Oral)  Resp 12  Wt 136 lb 12.8 oz (62.052 kg)  SpO2 100% Orthostatic Standing Vital Signs: BP:130/70 P:86 Pox:99%

## 2015-01-07 NOTE — Progress Notes (Addendum)
Weekly Management Note:  Outpatient    ICD-9-CM ICD-10-CM   1. Tonsil cancer (H[facility]) 146.0 C09.9 fentaNYL (DURAGESIC - DOSED MCG/HR) 50 MCG/HR     ondansetron (ZOFRAN-ODT) 8 MG disintegrating tablet     HYDROcodone-acetaminophen (HYCET) 7.5-325 mg/15 ml solution     DISCONTINUED: HYDROcodone-acetaminophen (HYCET) 7.5-325 mg/15 ml solution    Current Dose: 52 Gy  Projected Dose: 70 Gy   Narrative:  The patient presents for routine under treatment assessment.  CBCT/MVCT images/Port film x-rays were reviewed.  The chart was checked.  Went to ED due to dysphagia with pills, throat pain, nausea, vomiting, dehydration. BUN elevated, Received IV Fluids and Zofran.  Feeling better with Zofran at home but throat pain still 8/10, affecting PO intake.  CT of neck revealed tumor regression, no enlarged cervical nodes.  Orthostatics checked and negative today.  Physical Findings:  Wt Readings from Last 3 Encounters:  01/07/15 136 lb 12.8 oz (62.052 kg)  12/31/14 142 lb 12.8 oz (64.774 kg)  12/24/14 142 lb 12.8 oz (64.774 kg)    weight is 136 lb 12.8 oz (62.052 kg). His oral temperature is 98.5 F (36.9 C). His blood pressure is 138/70 and his pulse is 78. His respiration is 12 and oxygen saturation is 100%.  NAD, oropharynx and skin erythematous with confluent mucositis, skin over neck is dry, no thrush.  CBC    Component Value Date/Time   WBC 10.3 01/06/2015 1422   RBC 4.90 01/06/2015 1422   HGB 15.2 01/06/2015 1422   HCT 43.3 01/06/2015 1422   PLT 267 01/06/2015 1422   MCV 88.4 01/06/2015 1422   MCH 31.0 01/06/2015 1422   MCHC 35.1 01/06/2015 1422   RDW 12.9 01/06/2015 1422   LYMPHSABS 0.5* 01/06/2015 1422   MONOABS 0.3 01/06/2015 1422   EOSABS 0.0 01/06/2015 1422   BASOSABS 0.0 01/06/2015 1422     CMP     Component Value Date/Time   NA 138 01/06/2015 1422   K 4.5 01/06/2015 1422   CL 100* 01/06/2015 1422   CO2 21* 01/06/2015 1422   GLUCOSE 185* 01/06/2015 1422   BUN 27*  01/06/2015 1422   BUN 12.8 11/23/2014 1013   CREATININE 1.02 01/06/2015 1422   CREATININE 0.8 11/23/2014 1013   CREATININE 0.81 09/19/2010 1339   CALCIUM 10.4* 01/06/2015 1422   PROT 8.0 01/06/2015 1422   ALBUMIN 4.6 01/06/2015 1422   AST 29 01/06/2015 1422   ALT 37 01/06/2015 1422   ALKPHOS 76 01/06/2015 1422   BILITOT 1.8* 01/06/2015 1422   GFRNONAA >60 01/06/2015 1422   GFRAA >60 01/06/2015 1422   Ct Soft Tissue Neck W Contrast  01/06/2015  CLINICAL DATA:  Right palatine tonsil cancer diagnosed in August 2016 status post ongoing radiation therapy. Patient presents with nausea, vomiting, hemoptysis and dysphagia. EXAM: CT NECK WITH CONTRAST TECHNIQUE: Multidetector CT imaging of the neck was performed using the standard protocol following the bolus administration of intravenous contrast. CONTRAST:  165mL OMNIPAQUE IOHEXOL 300 MG/ML  SOLN COMPARISON:  11/06/2014 neck CT.  10/29/2014 PET-CT. FINDINGS: Pharynx and larynx: There has been a significant interval treatment response in the right palatine tonsil tumor, with no residual measurable right palatine tonsil mass. Mild mucosal thickening and hyperenhancement in the bilateral oropharynx and epiglottis, slightly asymmetric to the right, is likely treatment related. Unremarkable nasopharynx. No laryngeal mass. No retropharyngeal or peritonsillar fluid collection. Salivary glands: Unremarkable . Thyroid: Unremarkable.  No thyroid nodules. Lymph nodes: No pathologically enlarged cervical nodes. Vascular: Intracranial  and extracranial carotid atherosclerosis bilaterally, with no significant proximal internal carotid artery stenoses. Limited intracranial: No acute abnormality. Visualized orbits: Intact . Mastoids and visualized paranasal sinuses: Prominent mucosal thickening throughout the right maxillary sinus with decreased opacification of the right maxillary sinus. Status post right maxillary antrostomy. Otherwise essentially clear visualized  paranasal sinuses. Mild partial opacification of the visualized right mastoid air cells. On opacified visualized left mastoid air cells. Skeleton: No aggressive appearing focal osseous lesions. Visualized median sternotomy wires are intact. Upper chest: No acute consolidative airspace disease, significant pulmonary nodules or lung masses. IMPRESSION: 1. Significant interval treatment response with no residual measurable right palatine tonsil mass. 2. Mild mucosal thickening and hyperenhancement in the bilateral oropharynx and epiglottis, slightly asymmetric to the right, likely treatment related. No retropharyngeal or peritonsillar fluid collection. 3. No cervical adenopathy. 4. Persistent prominent right maxillary sinus mucosal thickening with decreased right maxillary sinus opacification. Status post right maxillary antrostomy. 5. New mild partial opacification of the right mastoid air cells, nonspecific. Electronically Signed   By: Ilona Sorrel M.D.   On: 01/06/2015 16:14    Impression:  The patient is tolerating radiotherapy.   Plan:  Continue radiotherapy as planned.   Refills for pain meds given, increase to 38mcg duragesic patch. Change breakthrough pain meds to liquid Hycet with 50% increase in hydrocodone dose. Refills on Zofran given. IV fluids QOD, more often if needed.   Weight loss - notify [employer].  Push soft foods, shakes. Pt will discuss BP meds with PCP. Might withdraw some.    -----------------------------------  Eppie Gibson, MD

## 2015-01-08 ENCOUNTER — Ambulatory Visit (HOSPITAL_COMMUNITY)
Admission: RE | Admit: 2015-01-08 | Discharge: 2015-01-08 | Disposition: A | Discharge status: Home / Self Care | Payer: Medicare Other | Source: Ambulatory Visit | Attending: Radiation Oncology | Admitting: Radiation Oncology

## 2015-01-08 ENCOUNTER — Ambulatory Visit
Admission: RE | Admit: 2015-01-08 | Discharge: 2015-01-08 | Disposition: A | Discharge status: Home / Self Care | Payer: Medicare Other | Source: Ambulatory Visit | Attending: Radiation Oncology | Admitting: Radiation Oncology

## 2015-01-08 DIAGNOSIS — C099 Malignant neoplasm of tonsil, unspecified: Secondary | ICD-10-CM | POA: Insufficient documentation

## 2015-01-08 DIAGNOSIS — Z510 Encounter for antineoplastic radiation therapy: Secondary | ICD-10-CM | POA: Diagnosis not present

## 2015-01-08 MED ORDER — SODIUM CHLORIDE 0.9 % IV SOLN
INTRAVENOUS | Status: AC
  Administered 2015-01-08: 1000 mL via INTRAVENOUS

## 2015-01-08 NOTE — Progress Notes (Signed)
Diagnosis Association: Tonsil cancer ([place]) (146.0)           MD: Dr. Isidore Moos  Procedure: Pt received 1 liter of IV normal saline over 2 hours  Condition during procedure: Pt tolerated well  Condition post procedure: Pt alert, oriented and ambulatory

## 2015-01-09 ENCOUNTER — Ambulatory Visit: Payer: Medicare Other

## 2015-01-09 ENCOUNTER — Encounter: Payer: Self-pay | Admitting: *Deleted

## 2015-01-09 DIAGNOSIS — Z510 Encounter for antineoplastic radiation therapy: Secondary | ICD-10-CM | POA: Diagnosis not present

## 2015-01-09 NOTE — Progress Notes (Signed)
  Oncology Nurse Navigator Documentation   Navigator Encounter Type: Treatment (01/07/15 0900) Patient Visit Type: Radonc (01/07/15 0900)     To provide support and encouragement, care continuity and to assess for needs, met with patient and his wife during [place] with Dr. Isidore Moos.  He reported he went to ER yesterday, received IVF which made him feel better.  He was prescribed Zofran ODT which helped with his nausea.  As a result, he was able to drink small quantities of water, eat some soft foods.  He understands he will be scheduled for IVF QOD as needed until he completes RT, potentially s/p RT as needed. Will continue to follow/support.  Gayleen Orem, RN, BSN, [facility] at [facility] [phone]                  Time Spent with Patient: 45 (01/07/15 0900)

## 2015-01-10 ENCOUNTER — Other Ambulatory Visit: Payer: Self-pay | Admitting: *Deleted

## 2015-01-10 ENCOUNTER — Ambulatory Visit (HOSPITAL_COMMUNITY)
Admission: RE | Admit: 2015-01-10 | Discharge: 2015-01-10 | Disposition: A | Discharge status: Home / Self Care | Payer: Medicare Other | Source: Ambulatory Visit | Attending: Radiation Oncology | Admitting: Radiation Oncology

## 2015-01-10 ENCOUNTER — Ambulatory Visit
Admission: RE | Admit: 2015-01-10 | Discharge: 2015-01-10 | Disposition: A | Discharge status: Home / Self Care | Payer: Medicare Other | Source: Ambulatory Visit | Attending: Radiation Oncology | Admitting: Radiation Oncology

## 2015-01-10 DIAGNOSIS — Z510 Encounter for antineoplastic radiation therapy: Secondary | ICD-10-CM | POA: Diagnosis not present

## 2015-01-10 DIAGNOSIS — C099 Malignant neoplasm of tonsil, unspecified: Secondary | ICD-10-CM

## 2015-01-10 MED ORDER — SODIUM CHLORIDE 0.9 % IV SOLN
INTRAVENOUS | Status: AC
  Administered 2015-01-10: 09:00:00 via INTRAVENOUS

## 2015-01-10 NOTE — Progress Notes (Signed)
Diagnosis Association: Tonsil cancer ([facility]) (146.0)           MD: Dr. Squire  Procedure: Pt received 1 liter of IV normal saline over 2 hours  Condition during procedure: Pt tolerated well  Condition post procedure: Pt alert, oriented and ambulatory 

## 2015-01-11 ENCOUNTER — Ambulatory Visit: Payer: Medicare Other | Admitting: Radiation Oncology

## 2015-01-11 ENCOUNTER — Ambulatory Visit: Payer: Self-pay | Admitting: Internal Medicine

## 2015-01-11 ENCOUNTER — Ambulatory Visit: Admission: RE | Admit: 2015-01-11 | Payer: Medicare Other | Source: Ambulatory Visit

## 2015-01-11 DIAGNOSIS — Z510 Encounter for antineoplastic radiation therapy: Secondary | ICD-10-CM | POA: Diagnosis not present

## 2015-01-12 ENCOUNTER — Other Ambulatory Visit: Payer: Self-pay | Admitting: Internal Medicine

## 2015-01-12 ENCOUNTER — Ambulatory Visit (HOSPITAL_BASED_OUTPATIENT_CLINIC_OR_DEPARTMENT_OTHER): Payer: Self-pay

## 2015-01-12 VITALS — BP 135/60 | HR 66 | Temp 98.4°F

## 2015-01-12 DIAGNOSIS — C099 Malignant neoplasm of tonsil, unspecified: Secondary | ICD-10-CM

## 2015-01-12 MED ORDER — SODIUM CHLORIDE 0.9 % IV SOLN
INTRAVENOUS | Status: AC
  Administered 2015-01-12: 09:00:00 via INTRAVENOUS

## 2015-01-12 NOTE — Patient Instructions (Signed)

## 2015-01-14 ENCOUNTER — Ambulatory Visit (HOSPITAL_COMMUNITY): Admission: RE | Admit: 2015-01-14 | Payer: Medicare Other | Source: Ambulatory Visit

## 2015-01-14 ENCOUNTER — Encounter: Payer: Self-pay | Admitting: *Deleted

## 2015-01-14 ENCOUNTER — Ambulatory Visit
Admission: RE | Admit: 2015-01-14 | Discharge: 2015-01-14 | Disposition: A | Discharge status: Home / Self Care | Payer: Medicare Other | Source: Ambulatory Visit | Attending: Radiation Oncology | Admitting: Radiation Oncology

## 2015-01-14 ENCOUNTER — Ambulatory Visit (HOSPITAL_BASED_OUTPATIENT_CLINIC_OR_DEPARTMENT_OTHER): Payer: Medicare Other

## 2015-01-14 ENCOUNTER — Telehealth: Payer: Self-pay

## 2015-01-14 ENCOUNTER — Other Ambulatory Visit: Payer: Self-pay

## 2015-01-14 ENCOUNTER — Ambulatory Visit: Payer: Medicare Other

## 2015-01-14 VITALS — BP 146/70 | HR 83 | Temp 98.6°F | Resp 12 | Wt 132.2 lb

## 2015-01-14 VITALS — BP 134/53 | HR 79 | Temp 97.9°F | Resp 16

## 2015-01-14 DIAGNOSIS — C099 Malignant neoplasm of tonsil, unspecified: Secondary | ICD-10-CM

## 2015-01-14 DIAGNOSIS — E860 Dehydration: Secondary | ICD-10-CM | POA: Diagnosis not present

## 2015-01-14 DIAGNOSIS — Z510 Encounter for antineoplastic radiation therapy: Secondary | ICD-10-CM | POA: Diagnosis not present

## 2015-01-14 MED ORDER — FLUCONAZOLE 100 MG PO TABS: ORAL_TABLET | ORAL | Status: DC

## 2015-01-14 MED ORDER — SODIUM CHLORIDE 0.9 % IV SOLN: INTRAVENOUS | Status: DC

## 2015-01-14 MED ORDER — SODIUM CHLORIDE 0.9 % IV SOLN
INTRAVENOUS | Status: AC
  Administered 2015-01-14: 10:00:00 via INTRAVENOUS

## 2015-01-14 MED ORDER — HYDROCODONE-ACETAMINOPHEN 7.5-325 MG/15ML PO SOLN: ORAL | Status: DC

## 2015-01-14 NOTE — Patient Instructions (Signed)

## 2015-01-14 NOTE — Progress Notes (Signed)
PAIN: He rates his pain as a 8 on a scale of 0-10. constant, burning and stabbing over throat  SWALLOWING/DIET: Pt has had dysphagia for both solids and liquids. Pt reports a difficulty eating. Has minimal oral intake.  Oral exam reveals mild erythema and mucous membranes moist with white, yellow and tenacious sputum.  BOWEL: Pt reports Nausea and Vomiting, everyday bowel movement-loose. SKIN: Skin exam reveals Dryness, Pruritus, dry desquamation and erythema. Pt continues to apply Biafine as directed. OTHER: Pt complains of fatigue, weakness and poor appetite.   WEIGHT/VS: Wt Readings from Last 3 Encounters:  01/14/15 132 lb 3.2 oz (59.966 kg)  01/07/15 136 lb 12.8 oz (62.052 kg)  12/31/14 142 lb 12.8 oz (64.774 kg)   BP 146/70 mmHg  Pulse 83  Temp(Src) 98.6 F (37 C) (Oral)  Resp 12  Wt 132 lb 3.2 oz (59.966 kg)  SpO2 100% Orthostatic Standing Vital Signs: BP:128/70 P:95 Pox:100%

## 2015-01-14 NOTE — Progress Notes (Signed)
Weekly Management Note:  Outpatient    ICD-9-CM ICD-10-CM   1. Tonsil cancer (HCC) 146.0 C09.9 HYDROcodone-acetaminophen (HYCET) 7.5-325 mg/15 ml solution     fluconazole (DIFLUCAN) 100 MG tablet    Current Dose: 62 Gy  Projected Dose: 70 Gy   Narrative:  The patient presents for routine under treatment assessment.  CBCT/MVCT images/Port film x-rays were reviewed.  The chart was checked.   PAIN: He rates his pain as a 8 on a scale of 0-10. constant, burning and stabbing over throat  SWALLOWING/DIET: Pt has had dysphagia for both solids and liquids. Pt reports a difficulty eating. Has minimal oral intake.  Oral exam reveals mild erythema and mucous membranes moist with white, yellow and tenacious sputum.  BOWEL: Pt reports Nausea and Vomiting, everyday bowel movement-loose. SKIN: Skin exam reveals Dryness, Pruritus, dry desquamation and erythema. Pt continues to apply Biafine as directed. OTHER: Pt complains of fatigue, weakness and poor appetite.      BP 146/70 mmHg  Pulse 83  Temp(Src) 98.6 F (37 C) (Oral)  Resp 12  Wt 132 lb 3.2 oz (59.966 kg)  SpO2 100% Orthostatic Standing Vital Signs: BP:128/70 P:95 Pox:100%  Physical Findings:  Wt Readings from Last 3 Encounters:  01/14/15 132 lb 3.2 oz (59.966 kg)  01/07/15 136 lb 12.8 oz (62.052 kg)  12/31/14 142 lb 12.8 oz (64.774 kg)    weight is 132 lb 3.2 oz (59.966 kg). His oral temperature is 98.6 F (37 C). His blood pressure is 146/70 and his pulse is 83. His respiration is 12 and oxygen saturation is 100%.  NAD, oropharynx and skin erythematous with confluent mucositis, skin over neck is intact, ? of gingival thrush.  CBC    Component Value Date/Time   WBC 10.3 01/06/2015 1422   RBC 4.90 01/06/2015 1422   HGB 15.2 01/06/2015 1422   HCT 43.3 01/06/2015 1422   PLT 267 01/06/2015 1422   MCV 88.4 01/06/2015 1422   MCH 31.0 01/06/2015 1422   MCHC 35.1 01/06/2015 1422   RDW 12.9 01/06/2015 1422   LYMPHSABS 0.5*  01/06/2015 1422   MONOABS 0.3 01/06/2015 1422   EOSABS 0.0 01/06/2015 1422   BASOSABS 0.0 01/06/2015 1422     CMP     Component Value Date/Time   NA 138 01/06/2015 1422   K 4.5 01/06/2015 1422   CL 100* 01/06/2015 1422   CO2 21* 01/06/2015 1422   GLUCOSE 185* 01/06/2015 1422   BUN 27* 01/06/2015 1422   BUN 12.8 11/23/2014 1013   CREATININE 1.02 01/06/2015 1422   CREATININE 0.8 11/23/2014 1013   CREATININE 0.81 09/19/2010 1339   CALCIUM 10.4* 01/06/2015 1422   PROT 8.0 01/06/2015 1422   ALBUMIN 4.6 01/06/2015 1422   AST 29 01/06/2015 1422   ALT 37 01/06/2015 1422   ALKPHOS 76 01/06/2015 1422   BILITOT 1.8* 01/06/2015 1422   GFRNONAA >60 01/06/2015 1422   GFRAA >60 01/06/2015 1422   No results found.  Impression:  The patient is tolerating radiotherapy.   Plan:  Continue radiotherapy as planned.   Refills for  hycet given,continue 95mcg duragesic patch.    Weight loss -  [name] aware.  Dysphagia - refer back to see Garald Balding of SLP Gayleen Orem, RN, our Head and Neck Oncology Navigator offered appt to pt to see Glendell Docker today but pt declined.  I called and left VM urging Gasper to reconsider).  Push soft foods, shakes PRN. Feeding tube offered as an option.  He declines this.  Fluconazole for possible thrush.  IVF every day.  F/u next week.   -----------------------------------  Eppie Gibson, MD

## 2015-01-14 NOTE — Telephone Encounter (Signed)
I called and spoke to Kevin Mcclain regarding Kevin Mcclain appointments for IV hydration and lab draws for this week and next. She voiced her understanding, and will call with any other questions.

## 2015-01-14 NOTE — Progress Notes (Signed)
  Oncology Nurse Navigator Documentation   Navigator Encounter Type: Other (01/14/15 0945)         Interventions: Coordination of Care (01/14/15 0945)       In follow-up to Dr. Pearlie Oyster WUT appt with patient during which he complained of increasing dysphagia:  I arranged for him to be seen by Garald Balding, SLP, 12:30 today at [facility] after completing IVF.    He declined appt, indicated he "will manage, I'll use a straw".    He understands the next available appt with Glendell Docker is not until 11/28. Dr. Isidore Moos made aware.  Gayleen Orem, RN, BSN, [place] [facility] [place] [phone]        Time Spent with Patient: 30 (01/14/15 0945)

## 2015-01-14 NOTE — Addendum Note (Signed)
Encounter addended by: Margaret Pyle, [place] on: 01/14/2015 11:23 AM<BR>     Documentation filed: Rx Order Verification, Orders

## 2015-01-15 ENCOUNTER — Other Ambulatory Visit: Payer: Self-pay

## 2015-01-15 ENCOUNTER — Ambulatory Visit
Admission: RE | Admit: 2015-01-15 | Discharge: 2015-01-15 | Disposition: A | Discharge status: Home / Self Care | Payer: Medicare Other | Source: Ambulatory Visit | Attending: Radiation Oncology | Admitting: Radiation Oncology

## 2015-01-15 ENCOUNTER — Other Ambulatory Visit: Payer: Self-pay | Admitting: Radiation Oncology

## 2015-01-15 ENCOUNTER — Ambulatory Visit (HOSPITAL_COMMUNITY)
Admission: RE | Admit: 2015-01-15 | Discharge: 2015-01-15 | Disposition: A | Discharge status: Home / Self Care | Payer: Medicare Other | Source: Ambulatory Visit | Attending: Radiation Oncology | Admitting: Radiation Oncology

## 2015-01-15 ENCOUNTER — Telehealth: Payer: Self-pay

## 2015-01-15 ENCOUNTER — Ambulatory Visit: Payer: Self-pay

## 2015-01-15 DIAGNOSIS — C099 Malignant neoplasm of tonsil, unspecified: Secondary | ICD-10-CM

## 2015-01-15 DIAGNOSIS — Z510 Encounter for antineoplastic radiation therapy: Secondary | ICD-10-CM | POA: Diagnosis not present

## 2015-01-15 LAB — BASIC METABOLIC PANEL (CC13)
Anion Gap: 10 mEq/L (ref 3–11)
BUN: 9.4 mg/dL (ref 7.0–26.0)
CO2: 29 meq/L (ref 22–29)
Calcium: 9.7 mg/dL (ref 8.4–10.4)
Chloride: 104 mEq/L (ref 98–109)
Creatinine: 0.9 mg/dL (ref 0.7–1.3)
EGFR: 87 mL/min/{1.73_m2} — AB (ref >90)
GLUCOSE: 152 mg/dL — AB (ref 70–140)
POTASSIUM: 3.3 meq/L — AB (ref 3.5–5.1)
Sodium: 143 mEq/L (ref 136–145)

## 2015-01-15 MED ORDER — SODIUM CHLORIDE 0.9 % IV SOLN
INTRAVENOUS | Status: DC
  Administered 2015-01-15: 10:00:00 via INTRAVENOUS

## 2015-01-15 NOTE — Procedures (Signed)
[facility]  Procedure Note  Markjoseph Chee [id number] DOB: November 18, 1945 DOA: 01/15/2015   PCP: Drema Pry, DO   Associated Diagnosis: Malignant neoplasm of tonsil ([place]) (146.0); Dehydration  Procedure Note: IV infusion of 1 liter NS   Condition During Procedure: Pt tolerated well  Condition at Discharge:  No complications noted   Nigel Sloop, [facility]

## 2015-01-15 NOTE — Telephone Encounter (Signed)
I called and spoke with Kevin Mcclain to inform her that Kevin Mcclain potassium was low, and he is going to receive potassium in his IV fluids this week and next. I also told her that Kevin Mcclain will need to have labs drawn on Friday to recheck his potassium level. She verbalized her understanding.

## 2015-01-16 ENCOUNTER — Ambulatory Visit
Admission: RE | Admit: 2015-01-16 | Discharge: 2015-01-16 | Disposition: A | Discharge status: Home / Self Care | Payer: Medicare Other | Source: Ambulatory Visit | Attending: Radiation Oncology | Admitting: Radiation Oncology

## 2015-01-16 ENCOUNTER — Ambulatory Visit (HOSPITAL_COMMUNITY)
Admission: RE | Admit: 2015-01-16 | Discharge: 2015-01-16 | Disposition: A | Discharge status: Home / Self Care | Payer: Medicare Other | Source: Ambulatory Visit | Attending: Radiation Oncology | Admitting: Radiation Oncology

## 2015-01-16 DIAGNOSIS — Z510 Encounter for antineoplastic radiation therapy: Secondary | ICD-10-CM | POA: Diagnosis not present

## 2015-01-16 DIAGNOSIS — C099 Malignant neoplasm of tonsil, unspecified: Secondary | ICD-10-CM | POA: Diagnosis not present

## 2015-01-16 MED ORDER — SODIUM CHLORIDE 0.9 % IV SOLN
INTRAVENOUS | Status: DC
  Administered 2015-01-16: 09:00:00 via INTRAVENOUS
  Filled 2015-01-16: qty 1000

## 2015-01-16 NOTE — Progress Notes (Addendum)
  Kevin Mcclain [id number] DOB: 1945-12-11 DOA: 01/16/2015   PCP: Drema Pry, DO   Associated Diagnosis: Malignant neoplasm of tonsil ([place]) (146.0); Dehydration  Procedure Note: IV infusion of 1 liter NS with 30 meq of potassium   Condition During Procedure: Pt tolerated well  Condition at Discharge: No complications noted

## 2015-01-16 NOTE — Progress Notes (Signed)
Mrs. Kevin Mcclain noted to be concerned when scheduled medicine had to be ordered from pharmacy. She seemed to think that this delays care and could have been done prior to patient arrival. She was also concerned that we are not aware of what fluids need to be given to the patient. I tried my best to inform Mrs. Kevin Mcclain about the care we provide and how some medicines always have to be mixed in the pharmacy, but she was seemingly upset and did not want to continue the conversation. I made the nursing leader Roberto Scales, RN aware that Mrs. Kevin Mcclain has concerns and I was unable to help her with that. I asked her if she could talk to the wife of our patient to resolve this issue. [name], [name]

## 2015-01-17 ENCOUNTER — Ambulatory Visit
Admission: RE | Admit: 2015-01-17 | Discharge: 2015-01-17 | Disposition: A | Discharge status: Home / Self Care | Payer: Medicare Other | Source: Ambulatory Visit | Attending: Radiation Oncology | Admitting: Radiation Oncology

## 2015-01-17 ENCOUNTER — Ambulatory Visit (HOSPITAL_COMMUNITY)
Admission: RE | Admit: 2015-01-17 | Discharge: 2015-01-17 | Disposition: A | Discharge status: Home / Self Care | Payer: Medicare Other | Source: Ambulatory Visit | Attending: Radiation Oncology | Admitting: Radiation Oncology

## 2015-01-17 ENCOUNTER — Ambulatory Visit: Payer: Medicare Other | Admitting: Nutrition

## 2015-01-17 DIAGNOSIS — C099 Malignant neoplasm of tonsil, unspecified: Secondary | ICD-10-CM | POA: Diagnosis not present

## 2015-01-17 DIAGNOSIS — Z510 Encounter for antineoplastic radiation therapy: Secondary | ICD-10-CM | POA: Diagnosis not present

## 2015-01-17 MED ORDER — SODIUM CHLORIDE 0.9 % IV SOLN
INTRAVENOUS | Status: DC
  Administered 2015-01-17: 09:00:00 via INTRAVENOUS
  Filled 2015-01-17: qty 1000

## 2015-01-17 NOTE — Progress Notes (Signed)
Itiel Borner [id number] DOB: 1945/05/26 DOA: 01/17/2015   PCP: Drema Pry, DO   Associated Diagnosis: Malignant neoplasm of tonsil ([place]) (146.0); Dehydration  Procedure Note: IV infusion of 1 liter NS with 30 meq of potassium   Condition During Procedure: Pt tolerated well  Condition at Discharge: No complications noted

## 2015-01-17 NOTE — Progress Notes (Signed)
Nutrition follow-up completed with patient during IV fluids.  Patient has almost completed radiation therapy for tonsil cancer. Weight continues to decrease and was documented as 132.2 pounds November 14, down from 142.8 pounds October 24. Patient has refused feeding tube. Patient continues to report thickened saliva. States he has been able to tolerate two Ensure Plus daily if he thins them down and drinks them with a straw. Noted potassium 3.3, glucose 152 on November 15.  Severe malnutrition continues.  Nutrition diagnosis:  Unintended weight loss continues.  Intervention:  Educated patient on strategies for thinning down other liquids to provide additional calories and protein. Recommended patient consuming [employer] mixed with whole milk, in addition to Ensure Plus or boost plus. Provided coupons for oral nutrition supplements. Enforced the importance of patient rinsing his mouth to thin down secretions. Encouraged increased fluid intake. Provided supportive listening.  Monitoring, evaluation, goals: Patient will work to increase calories and protein to minimize weight loss.  Next visit: Tuesday, November 22, during IV fluids.  **Disclaimer: This note was dictated with voice recognition software. Similar sounding words can inadvertently be transcribed and this note may contain transcription errors which may not have been corrected upon publication of note.**

## 2015-01-18 ENCOUNTER — Encounter: Payer: Self-pay | Admitting: Radiation Oncology

## 2015-01-18 ENCOUNTER — Ambulatory Visit (HOSPITAL_COMMUNITY)
Admission: RE | Admit: 2015-01-18 | Discharge: 2015-01-18 | Disposition: A | Discharge status: Home / Self Care | Payer: Medicare Other | Source: Ambulatory Visit | Attending: Radiation Oncology | Admitting: Radiation Oncology

## 2015-01-18 ENCOUNTER — Ambulatory Visit
Admission: RE | Admit: 2015-01-18 | Discharge: 2015-01-18 | Disposition: A | Discharge status: Home / Self Care | Payer: Medicare Other | Source: Ambulatory Visit | Attending: Radiation Oncology | Admitting: Radiation Oncology

## 2015-01-18 DIAGNOSIS — Z510 Encounter for antineoplastic radiation therapy: Secondary | ICD-10-CM | POA: Diagnosis not present

## 2015-01-18 DIAGNOSIS — C099 Malignant neoplasm of tonsil, unspecified: Secondary | ICD-10-CM | POA: Diagnosis not present

## 2015-01-18 LAB — BASIC METABOLIC PANEL (CC13)
Anion Gap: 8 mEq/L (ref 3–11)
BUN: 7.8 mg/dL (ref 7.0–26.0)
CO2: 29 meq/L (ref 22–29)
Calcium: 9.4 mg/dL (ref 8.4–10.4)
Chloride: 103 mEq/L (ref 98–109)
Creatinine: 0.8 mg/dL (ref 0.7–1.3)
GLUCOSE: 116 mg/dL (ref 70–140)
POTASSIUM: 3.9 meq/L (ref 3.5–5.1)
Sodium: 141 mEq/L (ref 136–145)

## 2015-01-18 MED ORDER — SODIUM CHLORIDE 0.9 % IV SOLN
INTRAVENOUS | Status: AC
  Administered 2015-01-18: 10:00:00 via INTRAVENOUS
  Filled 2015-01-18: qty 1000

## 2015-01-18 NOTE — Progress Notes (Signed)
  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 The patient will return to radiation oncology clinic for routine followup in a few days. I advised them to call or return sooner if they have any questions or concerns related to their recovery or treatment.  -----------------------------------  Eppie Gibson, MD  This document serves as a record of services personally performed by Eppie Gibson, MD. It was created on her behalf by Darcus Austin, a trained medical scribe. The creation of this record is based on the scribe's personal observations and the provider's statements to them. This document has been checked and approved by the attending provider.

## 2015-01-18 NOTE — Procedures (Signed)
[facility]  Procedure Note  Jacquel Fago [id number] DOB: 17-Jun-1945 DOA: 01/18/2015   Dr. Isidore Moos  Associated Diagnosis: Malignant neoplasm of tonsil ([place]) (146.0); Dehydration  Procedure Note: IV infusion of 1 liter NS with 30 meq of potassium   Condition During Procedure: Pt tolerated well  Condition at Discharge: No complications noted   Roberto Scales, RN  [facility]

## 2015-01-21 ENCOUNTER — Ambulatory Visit (HOSPITAL_BASED_OUTPATIENT_CLINIC_OR_DEPARTMENT_OTHER)
Admission: RE | Admit: 2015-01-21 | Discharge: 2015-01-21 | Disposition: A | Discharge status: Home / Self Care | Payer: Medicare Other | Source: Ambulatory Visit | Attending: Radiation Oncology | Admitting: Radiation Oncology

## 2015-01-21 ENCOUNTER — Ambulatory Visit
Admission: RE | Admit: 2015-01-21 | Discharge: 2015-01-21 | Disposition: A | Discharge status: Home / Self Care | Payer: Medicare Other | Source: Ambulatory Visit | Attending: Radiation Oncology | Admitting: Radiation Oncology

## 2015-01-21 ENCOUNTER — Ambulatory Visit: Payer: Medicare Other | Attending: Radiation Oncology

## 2015-01-21 ENCOUNTER — Encounter (HOSPITAL_COMMUNITY): Payer: Medicare Other

## 2015-01-21 ENCOUNTER — Ambulatory Visit: Payer: Medicare Other

## 2015-01-21 VITALS — BP 134/68 | HR 88 | Temp 98.2°F | Resp 12 | Wt 130.4 lb

## 2015-01-21 DIAGNOSIS — C099 Malignant neoplasm of tonsil, unspecified: Secondary | ICD-10-CM

## 2015-01-21 LAB — BASIC METABOLIC PANEL (CC13)
Anion Gap: 9 mEq/L (ref 3–11)
BUN: 10.4 mg/dL (ref 7.0–26.0)
CALCIUM: 10.3 mg/dL (ref 8.4–10.4)
CO2: 29 mEq/L (ref 22–29)
Chloride: 101 mEq/L (ref 98–109)
Creatinine: 0.9 mg/dL (ref 0.7–1.3)
EGFR: 87 mL/min/{1.73_m2} — AB (ref >90)
GLUCOSE: 129 mg/dL (ref 70–140)
POTASSIUM: 4.1 meq/L (ref 3.5–5.1)
Sodium: 140 mEq/L (ref 136–145)

## 2015-01-21 MED ORDER — FENTANYL 50 MCG/HR TD PT72: MEDICATED_PATCH | TRANSDERMAL | Status: DC

## 2015-01-21 MED ORDER — SODIUM CHLORIDE 0.9 % IV SOLN: INTRAVENOUS | Status: DC

## 2015-01-21 MED ORDER — HYDROCODONE-ACETAMINOPHEN 7.5-325 MG/15ML PO SOLN: ORAL | Status: DC

## 2015-01-21 NOTE — Progress Notes (Signed)
Pain Status: He rates his pain as a 5 on a scale of 0-10.constant, sharp and burning. Pain will increase to a 8/10 when swallowing. BP 134/68 mmHg  Pulse 88  Temp(Src) 98.2 F (36.8 C) (Oral)  Resp 12  Wt 130 lb 6.4 oz (59.149 kg)  SpO2 100% Orthostatic Standing Vital Signs: BP: 119/73 P:96 Pox:100%  Nutritional Status a) intake: PO Diet: 2 cans of boost (12oz), water 25-30 oz of water.  No food intake b) weight changes, if any:  Wt Readings from Last 3 Encounters:  01/21/15 130 lb 6.4 oz (59.149 kg)  01/14/15 132 lb 3.2 oz (59.966 kg)  01/07/15 136 lb 12.8 oz (62.052 kg)   Swallowing Status: Pt has had dysphagia for both solids and liquids. Also very painful swallowing. Noted white/yellow coating on tongue.  Thick yellow, with occasionally brown/red sputum.  BOWEL: Pt reports soft bowel movement every day. SKIN: Skin exam reveals Dryness, Pruritus and erythema. Pt continues to apply Biafine as directed.

## 2015-01-21 NOTE — Progress Notes (Signed)
Radiation Oncology         [phone] ________________________________  Name: Kevin Mcclain MRN: [id number]  Date: 01/21/2015  DOB: 02/06/1946  Follow-Up Visit Note  CC: Drema Pry, DO  Jodi Marble, MD  Diagnosis and Prior Radiotherapy:       ICD-9-CM ICD-10-CM   1. Tonsil cancer (HCC) 146.0 C09.9 fentaNYL (DURAGESIC - DOSED MCG/HR) 50 MCG/HR     HYDROcodone-acetaminophen (HYCET) 7.5-325 mg/15 ml solution     Basic metabolic panel     sodium chloride 0.9 % 1,000 mL with potassium chloride 30 mEq infusion     sodium chloride 0.9 % 1,000 mL with potassium chloride 30 mEq infusion     sodium chloride 0.9 % 1,000 mL with potassium chloride 30 mEq infusion     sodium chloride 0.9 % 1,000 mL with potassium chloride 30 mEq infusion     sodium chloride 0.9 % 1,000 mL with potassium chloride 30 mEq infusion     sodium chloride 0.9 % 1,000 mL with potassium chloride 30 mEq infusion     sodium chloride 0.9 % 1,000 mL with potassium chloride 30 mEq infusion     sodium chloride 0.9 % 1,000 mL with potassium chloride 30 mEq infusion     sodium chloride 0.9 % 1,000 mL with potassium chloride 30 mEq infusion     sodium chloride 0.9 % 1,000 mL with potassium chloride 30 mEq infusion     NM PET Image Restag (PS) Skull Base To Thigh     TSH     DISCONTINUED: HYDROcodone-acetaminophen (HYCET) 7.5-325 mg/15 ml solution      Diagnosis: 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   Nutritional Status a) intake: PO Diet: 2 cans of boost (12oz), water 25-30 oz of water.  No food intake b) weight changes, if any:  Wt  Readings from Last 3 Encounters:  01/21/15 130 lb 6.4 oz (59.149 kg)  01/14/15 132 lb 3.2 oz (59.966 kg)  01/07/15 136 lb 12.8 oz (62.052 kg)   Swallowing Status: Pt has had dysphagia for both solids and liquids. Also very painful swallowing. Noted white/yellow coating on tongue.  Thick yellow, with occasionally brown/red sputum.  BOWEL: Pt reports soft bowel movement every day. SKIN: Skin exam reveals Dryness, Pruritus and erythema. Pt continues to apply Biafine as directed.                    ALLERGIES:  is allergic to benzonatate; codeine; and sitagliptin phosphate.  Meds: Current Outpatient Prescriptions  Medication Sig Dispense Refill  . allopurinol (ZYLOPRIM) 300 MG tablet TAKE 1 TABLET BY MOUTH EVERY DAY 90 tablet 1  . amLODipine (NORVASC) 10 MG tablet TAKE 1 TABLET BY MOUTH EVERY DAY 90 tablet 1  . aspirin EC 325 MG tablet Take 325 mg by mouth daily.    Marland Kitchen atorvastatin (LIPITOR) 20 MG tablet TAKE 1 TABLET BY MOUTH EVERY DAY 90 tablet 1  . colchicine 0.6 MG tablet Take 1 tablet (0.6 mg total) by mouth daily as needed. (Patient taking differently: Take 0.6 mg by mouth daily as needed (  gout flare). ) 90 tablet 1  . desoximetasone (TOPICORT) 0.25 % cream APPLY TOPICALLY TWICE DAILY (Patient not taking: Reported on 12/24/2014) 30 g 5  . fentaNYL (DURAGESIC - DOSED MCG/HR) 50 MCG/HR Apply 1 patch q 3 days to skin. 5 patch 0  . fluconazole (DIFLUCAN) 100 MG tablet Take 2 tablets today, then 1 tablet daily x 20 more days. Hold atorvastatin and colchicine while on this Rx 22 tablet 0  . HYDROcodone-acetaminophen (HYCET) 7.5-325 mg/15 ml solution Take 15-30mL every 4 hrs PRN pain. Do not exceed 178mL over 24 hours. 1000 mL 0  . lidocaine (XYLOCAINE) 2 % solution Mix 1 part 2%viscous lidocaine,1part H2O.Swish and/or swallow 49ml of this mixture,4min before meals and at bedtime, up to QID 100 mL 5  . metFORMIN (GLUCOPHAGE) 500 MG tablet TAKE 1 TABLET BY MOUTH 3 TIMES A DAY (Patient taking  differently: TAKE 1 TABLET BY MOUTH WITH BREAKFAST AND TAKE 2 TABLETS BY MOUTH AT 9PM) 270 tablet 1  . metoprolol succinate (TOPROL-XL) 50 MG 24 hr tablet TAKE 1 TABLET BY MOUTH DAILY. TAKE WITH OR IMMEDIATELY FOLLOWING A MEAL (Patient taking differently: TAKE 1 TABLET BY MOUTH DAILY WITH BREAKFAST) 90 tablet 1  . nitroGLYCERIN (NITROSTAT) 0.4 MG SL tablet Place 1 tablet (0.4 mg total) under the tongue every 5 (five) minutes as needed. 50 tablet 11  . ondansetron (ZOFRAN-ODT) 8 MG disintegrating tablet Take 1 tablet (8 mg total) by mouth every 8 (eight) hours as needed for nausea or vomiting. 30 tablet 5  . OVER THE COUNTER MEDICATION Place 1 drop into both eyes 3 (three) times daily as needed (dry eyes/ irritation). Clear Cooling Eye Drops    . promethazine (PHENERGAN) 25 MG tablet Take 1 tablet (25 mg total) by mouth every 6 (six) hours as needed for nausea or vomiting. (Patient not taking: Reported on 01/06/2015) 60 tablet 2  . ramipril (ALTACE) 5 MG capsule TAKE 2 CAPSULES BY MOUTH EVERY DAY 180 capsule 1  . ranitidine (ZANTAC) 75 MG tablet Take 75 mg by mouth daily as needed for heartburn (acid reflux).     Marland Kitchen senna (SENOKOT) 8.6 MG TABS tablet Take 1 tablet (8.6 mg total) by mouth at bedtime as needed for mild constipation. 30 each 1  . sodium fluoride (FLUORISHIELD) 1.1 % GEL dental gel Instill one drop of gel per tooth space of fluoride tray. Place over teeth for 5 minutes. Remove. Spit out excess. Repeat nightly. 120 mL prn  . sucralfate (CARAFATE) 1 G tablet Dissolve 1 tablet in 53ml H2O and swallow up to QID,PRN sore throat. 60 tablet 5  . sulindac (CLINORIL) 200 MG tablet Take 0.5 tablets (100 mg total) by mouth daily as needed. (Patient not taking: Reported on 01/06/2015) 45 tablet 1   Current Facility-Administered Medications  Medication Dose Route Frequency Provider Last Rate Last Dose  . [START ON 01/25/2015] sodium chloride 0.9 % 1,000 mL with potassium chloride 30 mEq infusion    Intravenous Continuous Eppie Gibson, MD      . Derrill Memo ON 01/26/2015] sodium chloride 0.9 % 1,000 mL with potassium chloride 30 mEq infusion   Intravenous Continuous Eppie Gibson, MD      . Derrill Memo ON 01/27/2015] sodium chloride 0.9 % 1,000 mL with potassium chloride 30 mEq infusion   Intravenous Continuous Eppie Gibson, MD      . Derrill Memo ON 01/28/2015] sodium chloride 0.9 % 1,000 mL with potassium chloride 30 mEq infusion   Intravenous Continuous Eppie Gibson, MD      . [  START ON 01/29/2015] sodium chloride 0.9 % 1,000 mL with potassium chloride 30 mEq infusion   Intravenous Continuous Eppie Gibson, MD        Physical Findings: The patient is in no acute distress. Patient is alert and oriented. Wt Readings from Last 3 Encounters:  01/21/15 130 lb 6.4 oz (59.149 kg)  01/14/15 132 lb 3.2 oz (59.966 kg)  01/07/15 136 lb 12.8 oz (62.052 kg)    weight is 130 lb 6.4 oz (59.149 kg). His oral temperature is 98.2 F (36.8 C). His blood pressure is 134/68 and his pulse is 88. His respiration is 12 and oxygen saturation is 100%. .  General: Alert and oriented, in no acute distress HEENT: Head is normocephalic.  Oropharynx is notable for mucositis, no obvious thrush, but white patches over lower gumline Neck: Neck is notable for no palpable lumps Skin: Skin in treatment fields shows erythema, dryness  Psychiatric: Judgment and insight are intact. Affect is appropriate.   Lab Findings: Lab Results  Component Value Date   WBC 10.3 01/06/2015   HGB 15.2 01/06/2015   HCT 43.3 01/06/2015   MCV 88.4 01/06/2015   PLT 267 01/06/2015    Lab Results  Component Value Date   TSH 2.65 12/04/2013    Radiographic Findings: Ct Soft Tissue Neck W Contrast  01/06/2015  CLINICAL DATA:  Right palatine tonsil cancer diagnosed in August 2016 status post ongoing radiation therapy. Patient presents with nausea, vomiting, hemoptysis and dysphagia. EXAM: CT NECK WITH CONTRAST TECHNIQUE: Multidetector CT imaging of  the neck was performed using the standard protocol following the bolus administration of intravenous contrast. CONTRAST:  163mL OMNIPAQUE IOHEXOL 300 MG/ML  SOLN COMPARISON:  11/06/2014 neck CT.  10/29/2014 PET-CT. FINDINGS: Pharynx and larynx: There has been a significant interval treatment response in the right palatine tonsil tumor, with no residual measurable right palatine tonsil mass. Mild mucosal thickening and hyperenhancement in the bilateral oropharynx and epiglottis, slightly asymmetric to the right, is likely treatment related. Unremarkable nasopharynx. No laryngeal mass. No retropharyngeal or peritonsillar fluid collection. Salivary glands: Unremarkable . Thyroid: Unremarkable.  No thyroid nodules. Lymph nodes: No pathologically enlarged cervical nodes. Vascular: Intracranial and extracranial carotid atherosclerosis bilaterally, with no significant proximal internal carotid artery stenoses. Limited intracranial: No acute abnormality. Visualized orbits: Intact . Mastoids and visualized paranasal sinuses: Prominent mucosal thickening throughout the right maxillary sinus with decreased opacification of the right maxillary sinus. Status post right maxillary antrostomy. Otherwise essentially clear visualized paranasal sinuses. Mild partial opacification of the visualized right mastoid air cells. On opacified visualized left mastoid air cells. Skeleton: No aggressive appearing focal osseous lesions. Visualized median sternotomy wires are intact. Upper chest: No acute consolidative airspace disease, significant pulmonary nodules or lung masses. IMPRESSION: 1. Significant interval treatment response with no residual measurable right palatine tonsil mass. 2. Mild mucosal thickening and hyperenhancement in the bilateral oropharynx and epiglottis, slightly asymmetric to the right, likely treatment related. No retropharyngeal or peritonsillar fluid collection. 3. No cervical adenopathy. 4. Persistent prominent  right maxillary sinus mucosal thickening with decreased right maxillary sinus opacification. Status post right maxillary antrostomy. 5. New mild partial opacification of the right mastoid air cells, nonspecific. Electronically Signed   By: Ilona Sorrel M.D.   On: 01/06/2015 16:14    Impression/Plan:    1) Head and Neck Cancer Status: healing from RT  2) Nutritional Status: push intake, has lost weight PEG tube: none  3) Risk Factors: The patient has been educated about risk factors including  alcohol and tobacco abuse; they understand that avoidance of alcohol and tobacco is important to prevent recurrences as well as other cancers  4) Swallowing: functional  5) Dental: Encouraged to continue regular followup with dentistry, and dental hygiene including fluoride rinses.    6) Thyroid function: recheck in 3-4 mo Lab Results  Component Value Date   TSH 2.65 12/04/2013    7) Other: IV fluids for next 7-10 days   8) Follow-up in 3-4 months with TSH and PET; sooner if needed. The patient was encouraged to call with any issues or questions before then. He will see [name], NP of survivorship for supportive care, medication management until then.  _____________________________________   Eppie Gibson, MD

## 2015-01-22 ENCOUNTER — Ambulatory Visit (HOSPITAL_BASED_OUTPATIENT_CLINIC_OR_DEPARTMENT_OTHER): Payer: Medicare Other

## 2015-01-22 ENCOUNTER — Telehealth: Payer: Self-pay | Admitting: *Deleted

## 2015-01-22 ENCOUNTER — Ambulatory Visit: Payer: Medicare Other | Admitting: Nutrition

## 2015-01-22 ENCOUNTER — Other Ambulatory Visit: Payer: Self-pay

## 2015-01-22 VITALS — BP 127/53 | HR 74 | Temp 97.0°F | Resp 18

## 2015-01-22 DIAGNOSIS — E860 Dehydration: Secondary | ICD-10-CM

## 2015-01-22 DIAGNOSIS — C099 Malignant neoplasm of tonsil, unspecified: Secondary | ICD-10-CM

## 2015-01-22 MED ORDER — ONDANSETRON HCL 40 MG/20ML IJ SOLN
Freq: Once | INTRAMUSCULAR | Status: AC
  Administered 2015-01-22: 09:00:00 via INTRAVENOUS
  Filled 2015-01-22: qty 4

## 2015-01-22 MED ORDER — SODIUM CHLORIDE 0.9 % IV SOLN
INTRAVENOUS | Status: DC
  Administered 2015-01-22: 09:00:00 via INTRAVENOUS
  Filled 2015-01-22: qty 1000

## 2015-01-22 NOTE — Progress Notes (Signed)
Nutrition follow-up completed with patient during IV fluids.   Patient last seen November 17 in sickle cell clinic while receiving IV fluids. Weight continues to decrease and was documented as 130 pounds down from 132.2 pounds November 14. Patient continues to refuse feeding tube placement despite multiple recommendations. Patient has thickened saliva and states he vomited one time this morning  Patient now only tolerating one Ensure Plus a day, thinning it down with milk and drinking through a straw.   Severe malnutrition continues.  Nutrition diagnosis:  Unintended weight loss continues   Intervention :  Educated patient on importance of working to increase overall oral intake to promote healing. I provided patient with samples of boost very high-calorie and ENU oral nutrition supplements which have more calories and protein per serving.  Monitoring, evaluation, goals: patient will continue to work to increase calories and protein   Next visit: I will continue to follow patient when he receives IV fluids.Provided supportive listening and strong encouragement for increased oral intake.  **Disclaimer: This note was dictated with voice recognition software. Similar sounding words can inadvertently be transcribed and this note may contain transcription errors which may not have been corrected upon publication of note.**

## 2015-01-22 NOTE — Progress Notes (Signed)
Patient complains of bilateral throat pain rating the pain a 8 on the 0 to 10 pain scale. Patient states he took his home prescription for pain this morning approximately at 0545. Patient states he doesn't want anything else for pain right now. Patient verbalized understanding he is to inform this RN if he changes his mind and wants something for pain or if the pain increases.  Patient complains of nausea and one episode of vomiting this am. Selena Lesser, NP notified order given and carried out for zofran 8 mg IVPB.

## 2015-01-22 NOTE — Patient Instructions (Signed)

## 2015-01-22 NOTE — Telephone Encounter (Signed)
CALLED PATIENT TO INFORM OF LAB, PET SCAN FOR 05-02-15 AND HIS FU WITH DR. Isidore Moos ON 05-03-15, SPOKE WITH PATIENT'S WIFE PATRICIA AND SHE IS AWARE OF THESE APPTS.

## 2015-01-23 ENCOUNTER — Ambulatory Visit (HOSPITAL_BASED_OUTPATIENT_CLINIC_OR_DEPARTMENT_OTHER): Payer: Medicare Other

## 2015-01-23 ENCOUNTER — Other Ambulatory Visit: Payer: Self-pay | Admitting: Adult Health

## 2015-01-23 ENCOUNTER — Encounter: Payer: Self-pay | Admitting: Adult Health

## 2015-01-23 ENCOUNTER — Other Ambulatory Visit: Payer: Self-pay

## 2015-01-23 ENCOUNTER — Ambulatory Visit: Payer: Medicare Other | Admitting: Nutrition

## 2015-01-23 VITALS — BP 134/57 | HR 61 | Temp 98.8°F | Resp 18

## 2015-01-23 DIAGNOSIS — Z923 Personal history of irradiation: Secondary | ICD-10-CM

## 2015-01-23 DIAGNOSIS — C099 Malignant neoplasm of tonsil, unspecified: Secondary | ICD-10-CM

## 2015-01-23 DIAGNOSIS — E876 Hypokalemia: Secondary | ICD-10-CM

## 2015-01-23 DIAGNOSIS — E860 Dehydration: Secondary | ICD-10-CM

## 2015-01-23 DIAGNOSIS — R5383 Other fatigue: Secondary | ICD-10-CM

## 2015-01-23 MED ORDER — SODIUM CHLORIDE 0.9 % IV SOLN
Freq: Once | INTRAVENOUS | Status: AC
  Administered 2015-01-23: 09:00:00 via INTRAVENOUS

## 2015-01-23 MED ORDER — SODIUM CHLORIDE 0.9 % IV SOLN
Freq: Once | INTRAVENOUS | Status: AC
  Administered 2015-01-23: 09:00:00 via INTRAVENOUS
  Filled 2015-01-23: qty 1000

## 2015-01-23 MED ORDER — SODIUM CHLORIDE 0.9 % IV SOLN: INTRAVENOUS | Status: DC

## 2015-01-23 NOTE — Progress Notes (Signed)
Nutrition follow-up completed with patient in infusion. Patient states he was able to consume 2 supplements yesterday. Patient enjoys Carnation very high-calorie and is requesting additional samples.  Educated patient on importance of continuing to work to increase oral intake. I will order additional samples for patient and try to provide next week. Will continue to follow patient.

## 2015-01-23 NOTE — Progress Notes (Signed)
Per Dr. Pearlie Oyster nurse, Anderson Malta, please give the IV fluids with 30 mEQ of potassium today. Potassium level is 4.1.

## 2015-01-23 NOTE — Progress Notes (Signed)
I briefly met with Kevin Mcclain during his IV fluid infusion today.  He states that he is "feeling a little better than I felt yesterday."  He says that "the new supplement formula" that Barb, our oncology dietitian recommended for him, is working well.  He is mixing this supplement with milk and is able to tolerate it well.  He continues to decline feeding tube placement.   He will not have IV fluids tomorrow (d/t Thanksgiving holiday), but will return to [facility] on Friday for fluids.  He likely will not be receiving IV fluids on Saturday or Sunday.  I will see him on Monday morning for follow-up.   We will continue to provide supportive care for him during his acute post-treatment/survivorship phase.  I will see him on Monday morning for labs and office visit.  I have also ordered IV fluids to be administered after the office visit as well, pending the lab review and Kevin Mcclain's symptoms.  I will also provide pain control, as needed.  I gave Kevin Mcclain my card and encouraged him to call me with any questions or concerns.  I also encouraged him to call the on-call physician, should he need advice on his symptoms over the holiday or the upcoming weekend.  Kevin Mcclain voiced understanding of this plan. I look forward to participating in his care.   Gretchen Dawson, NP [facility] [phone] 

## 2015-01-23 NOTE — Patient Instructions (Signed)

## 2015-01-23 NOTE — Progress Notes (Signed)
I briefly met with Kevin Mcclain during his IV fluid infusion today.  He states that he is "feeling a little better than I felt yesterday."  He says that "the new supplement formula" that Barb, our oncology dietitian recommended for him, is working well.  He is mixing this supplement with milk and is able to tolerate it well.  He continues to decline feeding tube placement.   He will not have IV fluids tomorrow (d/t Thanksgiving holiday), but will return to [facility] on Friday for fluids.  He likely will not be receiving IV fluids on Saturday or Sunday.  I will see him on Monday morning for follow-up.   We will continue to provide supportive care for him during his acute post-treatment/survivorship phase.  I will see him on Monday morning for labs and office visit.  I have also ordered IV fluids to be administered after the office visit as well, pending the lab review and [name]'s symptoms.  I will also provide pain control, as needed.  I gave [name] my card and encouraged him to call me with any questions or concerns.  I also encouraged him to call the on-call physician, should he need advice on his symptoms over the holiday or the upcoming weekend.  [name] voiced understanding of this plan. I look forward to participating in his care.   Gretchen Dawson, NP [facility] [phone] 

## 2015-01-25 ENCOUNTER — Ambulatory Visit (HOSPITAL_BASED_OUTPATIENT_CLINIC_OR_DEPARTMENT_OTHER): Payer: Medicare Other

## 2015-01-25 VITALS — BP 141/52 | HR 61 | Temp 98.8°F | Resp 18

## 2015-01-25 DIAGNOSIS — C099 Malignant neoplasm of tonsil, unspecified: Secondary | ICD-10-CM | POA: Diagnosis not present

## 2015-01-25 MED ORDER — SODIUM CHLORIDE 0.9 % IV SOLN
Freq: Once | INTRAVENOUS | Status: AC
  Administered 2015-01-25: 12:00:00 via INTRAVENOUS

## 2015-01-25 MED ORDER — SODIUM CHLORIDE 0.9 % IV SOLN
Freq: Once | INTRAVENOUS | Status: DC
  Filled 2015-01-25: qty 1000

## 2015-01-25 MED ORDER — SODIUM CHLORIDE 0.9 % IV SOLN
Freq: Once | INTRAVENOUS | Status: AC
  Administered 2015-01-25: 12:00:00 via INTRAVENOUS
  Filled 2015-01-25: qty 1000

## 2015-01-25 NOTE — Patient Instructions (Signed)
Dehydration, Adult Dehydration is a condition in which you do not have enough fluid or water in your body. It happens when you take in less fluid than you lose. Vital organs such as the kidneys, brain, and heart cannot function without a proper amount of fluids. Any loss of fluids from the body can cause dehydration.  Dehydration can range from mild to severe. This condition should be treated right away to help prevent it from becoming severe. CAUSES  This condition may be caused by:  Vomiting.  Diarrhea.  Excessive sweating, such as when exercising in hot or humid weather.  Not drinking enough fluid during strenuous exercise or during an illness.  Excessive urine output.  Fever.  Certain medicines. RISK FACTORS This condition is more likely to develop in:  People who are taking certain medicines that cause the body to lose excess fluid (diuretics).   People who have a chronic illness, such as diabetes, that may increase urination.  Older adults.   People who live at high altitudes.   People who participate in endurance sports.  SYMPTOMS  Mild Dehydration  Thirst.  Dry lips.  Slightly dry mouth.  Dry, warm skin. Moderate Dehydration  Very dry mouth.   Muscle cramps.   Dark urine and decreased urine production.   Decreased tear production.   Headache.   Light-headedness, especially when you stand up from a sitting position.  Severe Dehydration  Changes in skin.   Cold and clammy skin.   Skin does not spring back quickly when lightly pinched and released.   Changes in body fluids.   Extreme thirst.   No tears.   Not able to sweat when body temperature is high, such as in hot weather.   Minimal urine production.   Changes in vital signs.   Rapid, weak pulse (more than 100 beats per minute when you are sitting still).   Rapid breathing.   Low blood pressure.   Other changes.   Sunken eyes.   Cold hands and feet.    Confusion.  Lethargy and difficulty being awakened.  Fainting (syncope).   Short-term weight loss.   Unconsciousness. DIAGNOSIS  This condition may be diagnosed based on your symptoms. You may also have tests to determine how severe your dehydration is. These tests may include:   Urine tests.   Blood tests.  TREATMENT  Treatment for this condition depends on the severity. Mild or moderate dehydration can often be treated at home. Treatment should be started right away. Do not wait until dehydration becomes severe. Severe dehydration needs to be treated at the hospital. Treatment for Mild Dehydration  Drinking plenty of water to replace the fluid you have lost.   Replacing minerals in your blood (electrolytes) that you may have lost.  Treatment for Moderate Dehydration  Consuming oral rehydration solution (ORS). Treatment for Severe Dehydration  Receiving fluid through an IV tube.   Receiving electrolyte solution through a feeding tube that is passed through your nose and into your stomach (nasogastric tube or NG tube).  Correcting any abnormalities in electrolytes. HOME CARE INSTRUCTIONS   Drink enough fluid to keep your urine clear or pale yellow.   Drink water or fluid slowly by taking small sips. You can also try sucking on ice cubes.  Have food or beverages that contain electrolytes. Examples include bananas and sports drinks.  Take over-the-counter and prescription medicines only as told by your health care provider.   Prepare ORS according to the manufacturer's instructions. Take sips   of ORS every 5 minutes until your urine returns to normal.  If you have vomiting or diarrhea, continue to try to drink water, ORS, or both.   If you have diarrhea, avoid:   Beverages that contain caffeine.   Fruit juice.   Milk.   Carbonated soft drinks.  Do not take salt tablets. This can lead to the condition of having too much sodium in your body  (hypernatremia).  SEEK MEDICAL CARE IF:  You cannot eat or drink without vomiting.  You have had moderate diarrhea during a period of more than 24 hours.  You have a fever. SEEK IMMEDIATE MEDICAL CARE IF:   You have extreme thirst.  You have severe diarrhea.  You have not urinated in 6-8 hours, or you have urinated only a small amount of very dark urine.  You have shriveled skin.  You are dizzy, confused, or both.   This information is not intended to replace advice given to you by your health care provider. Make sure you discuss any questions you have with your health care provider.   Document Released: 02/16/2005 Document Revised: 11/07/2014 Document Reviewed: 07/04/2014 Elsevier Interactive Patient Education 2016 [employer]   Hypokalemia Hypokalemia means that the amount of potassium in the blood is lower than normal.Potassium is a chemical, called an electrolyte, that helps regulate the amount of fluid in the body. It also stimulates muscle contraction and helps nerves function properly.Most of the body's potassium is inside of cells, and only a very small amount is in the blood. Because the amount in the blood is so small, minor changes can be life-threatening. CAUSES  Antibiotics.  Diarrhea or vomiting.  Using laxatives too much, which can cause diarrhea.  Chronic kidney disease.  Water pills (diuretics).  Eating disorders (bulimia).  Low magnesium level.  Sweating a lot. SIGNS AND SYMPTOMS  Weakness.  Constipation.  Fatigue.  Muscle cramps.  Mental confusion.  Skipped heartbeats or irregular heartbeat (palpitations).  Tingling or numbness. DIAGNOSIS  Your health care provider can diagnose hypokalemia with blood tests. In addition to checking your potassium level, your health care provider may also check other lab tests. TREATMENT Hypokalemia can be treated with potassium supplements taken by mouth or adjustments in your current medicines.  If your potassium level is very low, you may need to get potassium through a vein (IV) and be monitored in the hospital. A diet high in potassium is also helpful. Foods high in potassium are:  Nuts, such as peanuts and pistachios.  Seeds, such as sunflower seeds and pumpkin seeds.  Peas, lentils, and lima beans.  Whole grain and bran cereals and breads.  Fresh fruit and vegetables, such as apricots, avocado, bananas, cantaloupe, kiwi, oranges, tomatoes, asparagus, and potatoes.  Orange and tomato juices.  Red meats.  Fruit yogurt. HOME CARE INSTRUCTIONS  Take all medicines as prescribed by your health care provider.  Maintain a healthy diet by including nutritious food, such as fruits, vegetables, nuts, whole grains, and lean meats.  If you are taking a laxative, be sure to follow the directions on the label. SEEK MEDICAL CARE IF:  Your weakness gets worse.  You feel your heart pounding or racing.  You are vomiting or having diarrhea.  You are diabetic and having trouble keeping your blood glucose in the normal range. SEEK IMMEDIATE MEDICAL CARE IF:  You have chest pain, shortness of breath, or dizziness.  You are vomiting or having diarrhea for more than 2 days.  You faint. MAKE   SURE YOU:   Understand these instructions.  Will watch your condition.  Will get help right away if you are not doing well or get worse.   This information is not intended to replace advice given to you by your health care provider. Make sure you discuss any questions you have with your health care provider.   Document Released: 02/16/2005 Document Revised: 03/09/2014 Document Reviewed: 08/19/2012 Elsevier Interactive Patient Education 2016 [employer]  

## 2015-01-28 ENCOUNTER — Other Ambulatory Visit: Payer: Self-pay | Admitting: Adult Health

## 2015-01-28 ENCOUNTER — Other Ambulatory Visit (HOSPITAL_BASED_OUTPATIENT_CLINIC_OR_DEPARTMENT_OTHER): Payer: Medicare Other

## 2015-01-28 ENCOUNTER — Ambulatory Visit (HOSPITAL_COMMUNITY): Admission: RE | Admit: 2015-01-28 | Payer: Medicare Other | Source: Ambulatory Visit

## 2015-01-28 ENCOUNTER — Encounter: Payer: Self-pay | Admitting: Adult Health

## 2015-01-28 ENCOUNTER — Ambulatory Visit (HOSPITAL_BASED_OUTPATIENT_CLINIC_OR_DEPARTMENT_OTHER): Payer: Medicare Other | Admitting: Adult Health

## 2015-01-28 VITALS — BP 146/67 | HR 61 | Temp 97.4°F | Resp 14 | Wt 127.9 lb

## 2015-01-28 DIAGNOSIS — R634 Abnormal weight loss: Secondary | ICD-10-CM | POA: Diagnosis not present

## 2015-01-28 DIAGNOSIS — E876 Hypokalemia: Secondary | ICD-10-CM

## 2015-01-28 DIAGNOSIS — E860 Dehydration: Secondary | ICD-10-CM | POA: Diagnosis not present

## 2015-01-28 DIAGNOSIS — H9209 Otalgia, unspecified ear: Secondary | ICD-10-CM

## 2015-01-28 DIAGNOSIS — R252 Cramp and spasm: Secondary | ICD-10-CM

## 2015-01-28 DIAGNOSIS — K117 Disturbances of salivary secretion: Secondary | ICD-10-CM

## 2015-01-28 DIAGNOSIS — R1310 Dysphagia, unspecified: Secondary | ICD-10-CM

## 2015-01-28 DIAGNOSIS — R638 Other symptoms and signs concerning food and fluid intake: Secondary | ICD-10-CM

## 2015-01-28 DIAGNOSIS — C099 Malignant neoplasm of tonsil, unspecified: Secondary | ICD-10-CM

## 2015-01-28 DIAGNOSIS — B370 Candidal stomatitis: Secondary | ICD-10-CM

## 2015-01-28 DIAGNOSIS — R070 Pain in throat: Secondary | ICD-10-CM

## 2015-01-28 DIAGNOSIS — R5383 Other fatigue: Secondary | ICD-10-CM | POA: Diagnosis not present

## 2015-01-28 DIAGNOSIS — Z923 Personal history of irradiation: Secondary | ICD-10-CM

## 2015-01-28 LAB — CBC WITH DIFFERENTIAL/PLATELET
BASO%: 0.4 % (ref 0.0–2.0)
Basophils Absolute: 0 10*3/uL (ref 0.0–0.1)
EOS%: 2.8 % (ref 0.0–7.0)
Eosinophils Absolute: 0.2 10*3/uL (ref 0.0–0.5)
HCT: 40.3 % (ref 38.4–49.9)
HGB: 13.6 g/dL (ref 13.0–17.1)
LYMPH%: 8.1 % — AB (ref 14.0–49.0)
MCH: 30.9 pg (ref 27.2–33.4)
MCHC: 33.7 g/dL (ref 32.0–36.0)
MCV: 91.6 fL (ref 79.3–98.0)
MONO#: 0.6 10*3/uL (ref 0.1–0.9)
MONO%: 10.9 % (ref 0.0–14.0)
NEUT#: 4.4 10*3/uL (ref 1.5–6.5)
NEUT%: 77.8 % — AB (ref 39.0–75.0)
Platelets: 241 10*3/uL (ref 140–400)
RBC: 4.4 10*6/uL (ref 4.20–5.82)
RDW: 13.5 % (ref 11.0–14.6)
WBC: 5.7 10*3/uL (ref 4.0–10.3)
lymph#: 0.5 10*3/uL — ABNORMAL LOW (ref 0.9–3.3)

## 2015-01-28 LAB — COMPREHENSIVE METABOLIC PANEL (CC13)
ALK PHOS: 81 U/L (ref 40–150)
ALT: 42 U/L (ref 0–55)
AST: 36 U/L — AB (ref 5–34)
Albumin: 3.6 g/dL (ref 3.5–5.0)
Anion Gap: 12 mEq/L — ABNORMAL HIGH (ref 3–11)
BILIRUBIN TOTAL: 0.62 mg/dL (ref 0.20–1.20)
BUN: 13.8 mg/dL (ref 7.0–26.0)
CALCIUM: 10.1 mg/dL (ref 8.4–10.4)
CO2: 29 mEq/L (ref 22–29)
Chloride: 99 mEq/L (ref 98–109)
Creatinine: 0.9 mg/dL (ref 0.7–1.3)
EGFR: 87 mL/min/{1.73_m2} — AB (ref >90)
GLUCOSE: 148 mg/dL — AB (ref 70–140)
Potassium: 4.5 mEq/L (ref 3.5–5.1)
SODIUM: 139 meq/L (ref 136–145)
TOTAL PROTEIN: 6.9 g/dL (ref 6.4–8.3)

## 2015-01-28 LAB — TSH CHCC: TSH: 0.66 m(IU)/L (ref 0.320–4.118)

## 2015-01-28 MED ORDER — HYDROCODONE-ACETAMINOPHEN 7.5-325 MG/15ML PO SOLN: ORAL | Status: DC

## 2015-01-28 MED ORDER — SCOPOLAMINE 1 MG/3DAYS TD PT72: 1.0000 | MEDICATED_PATCH | TRANSDERMAL | Status: DC

## 2015-01-28 NOTE — Progress Notes (Signed)
CLINIC:  Survivorship  REASON FOR VISIT:  Routine follow-up for head & neck cancer and to address post-treatment acute survivorship needs.   BRIEF ONCOLOGIC HISTORY:  Oncology History   Tonsil cancer   Staging form: Pharynx - Oropharynx, AJCC 7th Edition     Clinical: Stage II (T2, N0, M0) - Signed by Eppie Gibson, MD on 10/31/2014       Tonsil cancer ([place])   12/04/2013 Miscellaneous TSH: 2.65; no h/o hypothyroidism.   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    11/06/2014 Imaging CT neck: Right palatine tonsil mass measuring 2.1 x 2.1 x 2.5 cm.  Asymmetric right LNs in level 1B, 2, and level 3.    12/03/2014 - 01/18/2015 Radiation Therapy Right tonsil and bilateral neck / 70 Gy in 35 fractions to gross disease, 63 Gy in 35 fractions to high risk nodal echelons, and 56 Gy in 35 fractions to intermediate risk nodal echelons   01/06/2015 Miscellaneous ER VISIT for dysphagia.  No hospital admission. CT neck done and pt was able to tolerate po liquids afer IV zofran. Discharged home.    01/06/2015 Imaging CT neck: Significant interval treatment response with no residual measurable right tonsil mass. Mild mucosal thickening and hyperenhancement in bilateral oropharynx & epiglottis, likely tx effect. No cervical adenpathy.     INTERVAL HISTORY:  Kevin Mcclain presents today for a follow-up visit for acute survivorship.  He reports that he has been able to tolerate about 2 cans of Boost per day and some water.  He has lost weight since his last visit to the cancer center 1 week ago.  He received IV fluids several days last week, with the exception of Thursday, for the Thanksgiving holiday.  His last infusion was on Friday,  01/25/15 (did not receive IV fluids over the weekend).   Pain: Throat pain; 5-6/10 at rest; 8-9/10 when swallowing.  Using liquid Hydrocodone 30 ml every 4 hours "around the clock"; also has 50 mcg Fentanyl patch.   Nutritional Status:  -Intake/Diet: 2 cans of Boost per day.  Working with Dietitian to get "increased calorie Boost" -Using a feeding tube? No -Weight change: Loss (~3 lbs) since 01/21/15  Swallowing:  Able to tolerate liquids; pain is biggest factor in swallowing.    Last ENT visit: Scheduled to see Dr. Erik Obey on Thursday, [id number] per pt's wife.  Last Dentist visit:  12/18/14 ([facility])-continue oral hygiene with fluoride at bedtime; trismus exercises; RTC in 2 months for post-RT oral exam.  Summary of last Med Onc visit: 10/31/14-Concurrent chemo not necessary.   Summary of last Rad Onc visit:  01/21/15-pain, weight loss, dehydration, dysphagia. Follow-up with Dr. Isidore Moos in 05/2015 with PET.   Last TSH value: 12/04/13-TSH 2.65  Last Imaging:  01/06/15-CT neck: Significant interval treatment response with no residual measurable right tonsil mass. Mild mucosal thickening and hyperenhancement in bilateral oropharynx & epiglottis, likely tx effect. No cervical adenpathy.    ADDITIONAL REVIEW OF SYSTEMS:  Review of Systems  Constitutional: Positive for weight loss and malaise/fatigue.       -Endorses fatigue and weakness. -Reports additional ~3 lb weight loss in past week.   HENT: Positive for ear pain and sore throat.        -"Right ear feels  like it has fluid in there."   -Severe sore throat s/p IMRT. 5-6/10 at rest; 8-9/10 when swallowing. Taking 30 ml liquid hydrocodone every 4 hours "around the clock."  Also has 66mcg Fentanyl patch. -Reports bilateral jaw pain. Is unable to do trismus exercises.  -Reports healing oral thrush. "I have 2 more days left of the Diflucan and it feels like it is getting better."    Respiratory: Positive for sputum production.        -Increased  oropharyngeal secretions; "it wakes me up at night.  The secretions are sometimes yellow, brown, or blood tinged."   Gastrointestinal: Negative for diarrhea and constipation.       Having bowel movements every day or every other day.      CURRENT MEDICATIONS:  Current Outpatient Prescriptions on File Prior to Visit  Medication Sig Dispense Refill  . allopurinol (ZYLOPRIM) 300 MG tablet TAKE 1 TABLET BY MOUTH EVERY DAY 90 tablet 1  . amLODipine (NORVASC) 10 MG tablet TAKE 1 TABLET BY MOUTH EVERY DAY 90 tablet 1  . aspirin EC 325 MG tablet Take 325 mg by mouth daily.    Marland Kitchen atorvastatin (LIPITOR) 20 MG tablet TAKE 1 TABLET BY MOUTH EVERY DAY 90 tablet 1  . colchicine 0.6 MG tablet Take 1 tablet (0.6 mg total) by mouth daily as needed. (Patient taking differently: Take 0.6 mg by mouth daily as needed (gout flare). ) 90 tablet 1  . desoximetasone (TOPICORT) 0.25 % cream APPLY TOPICALLY TWICE DAILY (Patient not taking: Reported on 12/24/2014) 30 g 5  . fentaNYL (DURAGESIC - DOSED MCG/HR) 50 MCG/HR Apply 1 patch q 3 days to skin. 5 patch 0  . fluconazole (DIFLUCAN) 100 MG tablet Take 2 tablets today, then 1 tablet daily x 20 more days. Hold atorvastatin and colchicine while on this Rx 22 tablet 0  . HYDROcodone-acetaminophen (HYCET) 7.5-325 mg/15 ml solution Take 15-44mL every 4 hrs PRN pain. Do not exceed 144mL over 24 hours. 1000 mL 0  . lidocaine (XYLOCAINE) 2 % solution Mix 1 part 2%viscous lidocaine,1part H2O.Swish and/or swallow 29ml of this mixture,21min before meals and at bedtime, up to QID 100 mL 5  . metFORMIN (GLUCOPHAGE) 500 MG tablet TAKE 1 TABLET BY MOUTH 3 TIMES A DAY (Patient taking differently: TAKE 1 TABLET BY MOUTH WITH BREAKFAST AND TAKE 2 TABLETS BY MOUTH AT 9PM) 270 tablet 1  . metoprolol succinate (TOPROL-XL) 50 MG 24 hr tablet TAKE 1 TABLET BY MOUTH DAILY. TAKE WITH OR IMMEDIATELY FOLLOWING A MEAL (Patient taking differently: TAKE 1 TABLET BY MOUTH DAILY WITH BREAKFAST) 90 tablet  1  . nitroGLYCERIN (NITROSTAT) 0.4 MG SL tablet Place 1 tablet (0.4 mg total) under the tongue every 5 (five) minutes as needed. 50 tablet 11  . ondansetron (ZOFRAN-ODT) 8 MG disintegrating tablet Take 1 tablet (8 mg total) by mouth every 8 (eight) hours as needed for nausea or vomiting. 30 tablet 5  . OVER THE COUNTER MEDICATION Place 1 drop into both eyes 3 (three) times daily as needed (dry eyes/ irritation). Clear Cooling Eye Drops    . promethazine (PHENERGAN) 25 MG tablet Take 1 tablet (25 mg total) by mouth every 6 (six) hours as needed for nausea or vomiting. (Patient not taking: Reported on 01/06/2015) 60 tablet 2  . ramipril (ALTACE) 5 MG capsule TAKE 2 CAPSULES BY MOUTH EVERY DAY 180 capsule 1  . ranitidine (ZANTAC) 75 MG tablet Take 75 mg by mouth daily as needed for heartburn (acid reflux).     Marland Kitchen  senna (SENOKOT) 8.6 MG TABS tablet Take 1 tablet (8.6 mg total) by mouth at bedtime as needed for mild constipation. 30 each 1  . sodium fluoride (FLUORISHIELD) 1.1 % GEL dental gel Instill one drop of gel per tooth space of fluoride tray. Place over teeth for 5 minutes. Remove. Spit out excess. Repeat nightly. 120 mL prn  . sucralfate (CARAFATE) 1 G tablet Dissolve 1 tablet in 7ml H2O and swallow up to QID,PRN sore throat. 60 tablet 5  . sulindac (CLINORIL) 200 MG tablet Take 0.5 tablets (100 mg total) by mouth daily as needed. (Patient not taking: Reported on 01/06/2015) 45 tablet 1   No current facility-administered medications on file prior to visit.    ALLERGIES:  Allergies  Allergen Reactions  . Benzonatate Swelling    "swells my throat"  . Codeine Itching  . Sitagliptin Phosphate Itching     PHYSICAL EXAM:  Filed Vitals:   01/28/15 0930  BP: 146/67  Pulse: 61  Temp: 97.4 F (36.3 C)  Resp: 14    Weight Date  127 lb 14.4 oz (58.015 kg) 01/28/15  130 lb 6.4 oz (59.149 kg) 01/21/15  132 lb 3.2 oz (59.966 kg) 01/14/15  136 lb 12.8 oz (62.052 kg) 01/07/15  142 lb 12.8 oz  (64.774 kg) 12/24/14  154 lb 9.6 oz (70.126 kg) Pre-treatment: 10/31/14   General: Male in no acute distress.  Accompanied by his wife today.  HEENT: Head is atraumatic and normocephalic.  Pupils equal and reactive to light. Conjunctivae clear without exudate.  Sclerae anicteric. Bilateral tympanic membranes visualized on otoscopic exam; no redness or bulging noted. No redness or exudate in external auditory canal. Oral mucosa is pink and moist. Saliva is thick.  Tongue pink, moist with white, thrush coating. Small area of thrush on soft palate.  Oropharynx is pink and moist. Posterior oropharynx difficult to visualize as patient with difficulty opening his mouth wide enough for exam.  (+) trismus.  Lymph: No preauricular, postauricular, cervical, or supraclavicular lymphadenopathy noted on palpation.   Neck: No palpable masses. Skin on neck is red and dry. No open lesions.  Skin in radiation treatment fields is progressing towards healing.  Cardiovascular: Normal rate and rhythm. Respiratory: Clear to auscultation bilaterally. Chest expansion symmetric without accessory muscle use on inspiration or expiration.   GI: Abdomen soft and round. No tenderness to palpation. Bowel sounds normoactive in 4 quadrants. No hepatosplenomegaly.  GU: Deferred.   Neuro: No focal deficits. Steady gait.   Psych: Normal mood and affect for situation. Extremities: No edema, cyanosis, or clubbing.   Skin: Warm and dry.    LABORATORY DATA:  CBC    Component Value Date/Time   WBC 5.7 01/28/2015 0907   WBC 10.3 01/06/2015 1422   RBC 4.40 01/28/2015 0907   RBC 4.90 01/06/2015 1422   HGB 13.6 01/28/2015 0907   HGB 15.2 01/06/2015 1422   HCT 40.3 01/28/2015 0907   HCT 43.3 01/06/2015 1422   PLT 241 01/28/2015 0907   PLT 267 01/06/2015 1422   MCV 91.6 01/28/2015 0907   MCV 88.4 01/06/2015 1422   MCH 30.9 01/28/2015 0907   MCH 31.0 01/06/2015 1422   MCHC 33.7 01/28/2015 0907   MCHC 35.1 01/06/2015 1422   RDW  13.5 01/28/2015 0907   RDW 12.9 01/06/2015 1422   LYMPHSABS 0.5* 01/28/2015 0907   LYMPHSABS 0.5* 01/06/2015 1422   MONOABS 0.6 01/28/2015 0907   MONOABS 0.3 01/06/2015 1422   EOSABS 0.2 01/28/2015 [id number]  EOSABS 0.0 01/06/2015 1422   BASOSABS 0.0 01/28/2015 0907   BASOSABS 0.0 01/06/2015 1422    BMET    Component Value Date/Time   NA 139 01/28/2015 0907   NA 138 01/06/2015 1422   K 4.5 01/28/2015 0907   K 4.5 01/06/2015 1422   CL 100* 01/06/2015 1422   CO2 29 01/28/2015 0907   CO2 21* 01/06/2015 1422   GLUCOSE 148* 01/28/2015 0907   GLUCOSE 185* 01/06/2015 1422   BUN 13.8 01/28/2015 0907   BUN 27* 01/06/2015 1422   CREATININE 0.9 01/28/2015 0907   CREATININE 1.02 01/06/2015 1422   CREATININE 0.81 09/19/2010 1339   CALCIUM 10.1 01/28/2015 0907   CALCIUM 10.4* 01/06/2015 1422   GFRNONAA >60 01/06/2015 1422   GFRAA >60 01/06/2015 1422    I have reviewed all the lab results. There are some abnormalities that are not critical to the patient's health. I reviewed the labs today, in detail, with the patient and his wife.    DIAGNOSTIC IMAGING:  None at this visit.     ASSESSMENT & PLAN:  Kevin Mcclain is a pleasant 69 y.o. male with history of cancer of the right tonsil, treated with IMRT alone; completed treatment on 01/18/15.  Patient presents to survivorship clinic today for routine follow-up and to address acute survivorship needs.   1. Cancer of the right tonsil: Kevin Mcclain is continuing to recover from definitive radiation therapy to his head & neck.  The patient will see Dr. Isidore Moos on 05/02/2015 for surveillance.  PET scan will be done and results reviewed with patient by Dr. Isidore Moos at that visit in 05/2015.    2. Weight loss: Kevin Mcclain has continued to lose weight. He has lost an additional ~3 lbs since his last visit at the cancer center 1 week ago.  Patient does not have a feeding tube and remains consistent in his wishes not to have one.  He is working  with our oncology dietitian, Raford Pitcher, for optimal nutrition/oral supplementation.  I spoke with [facility] regarding this patient and she let me know that additional Boost supplements are being delivered for Kevin Mcclain this week that have higher caloric intake.  We will continue to closely monitor his weight over the next several weeks.    3. Increased oropharyngeal secretions:  He agreed to try a scopolamine patch to try to help dry the secretions.  We reviewed the indications and administration instructions for this patch.  This prescription was e-prescribed to the patient's [facility] on [address] in [place].    4. Dehydration:  Clinically, Kevin Mcclain is dehydrated today.  He is not orthostatic.  His BUN is 13.8 (increased from 10.4 on 01/21/15) and creatinine is stable at 0.9.   Kevin Mcclain is concerned about hyperkalemia, given his cardiac history. Potassium is normal at 4.5 today.  We will not add KCl to his IV fluids for today or for the next few days of IV fluid infusions.   We attempted to place a peripheral IV today, but were unsuccessful x 2 attempts by 2 different nursing staff.  The patient and his wife did not want to stay for a 3rd attempt and therefore declined IV fluids for today.  They will return to radiation oncology department tomorrow, 01/29/15 at 11 am for consideration of IV fluids at that time.  He was encouraged to drink as much as tolerated in the next 24 hours.  I will follow-up with him closely this week and help manage his  dehydration, as clinically indicated.  5. Dysphagia/Throat Pain/Oral candidiasis: I refilled the hydrocodone liquid today [Hydrocodone-acetaminophin 7.5-325/15 ml solution; Take 15-30 ml Q4Hprn, #1031ml; no refills].  He will continue to use his Fentanyl patch as directed. He states that he did not need refills on the patch at this time.  Patient is currently not constipated, but is high-risk.  I stressed the importance of adequate bowel regimen while  on opiates.  He is unable to perform any of the swallowing exercises right now due to his pain.  I encouraged him to begin to do small amounts of the exercises, as tolerated.  He will complete his course of Diflucan for oral thrush; I encouraged the patient to let me know if his symptoms did not completely resolve after completing the Diflucan.   6. Ear pain and trismus:  On exam today, I did not see any signs of acute ear infection or fluid collection in the ear.  We discussed that this ear pain may be referred pain related to radiation changes/inflammation/continued healing in the neck, referred pain from the jaw/trismus, and/or completely unrelated to his cancer treatments and a benign finding.   Kevin Mcclain will see his ENT physician, Dr. Erik Obey, on Thursday, [id number] for follow-up and will make him aware of these concerns.  I will also share this progress note with him, as well.   7. At risk for hypothyroidism:  Patients with head & neck cancer who are treated with radiation therapy are at risk for hypothyroidism.  Last TSH we have on record was 2.65 on 12/04/13.  He will have labs prior to his next visit with Dr. Isidore Moos to assess stability of thyroid function.    Pending the results of the patient's upcoming staging PET scan, he will be eligible to see me in the survivorship clinic in the future for a survivorship care plan review visit, where we will discuss potential late/long-term side effects of treatment, additional follow-up/surveillance appointments with his cancer team, and offer continued social and emotional support.  I look forward to continuing to participate in this patient's care.   A total of 30 minutes was spent with this patient, with greater than 50% of that time in counseling and care-coordination.    [name], NP [place] [phone]

## 2015-01-29 ENCOUNTER — Other Ambulatory Visit: Payer: Self-pay | Admitting: Adult Health

## 2015-01-29 ENCOUNTER — Encounter: Payer: Self-pay | Admitting: Adult Health

## 2015-01-29 ENCOUNTER — Encounter (HOSPITAL_COMMUNITY): Payer: Medicare Other

## 2015-01-29 ENCOUNTER — Ambulatory Visit
Admission: RE | Admit: 2015-01-29 | Discharge: 2015-01-29 | Disposition: A | Discharge status: Home / Self Care | Payer: Medicare Other | Source: Ambulatory Visit | Attending: Radiation Oncology | Admitting: Radiation Oncology

## 2015-01-29 ENCOUNTER — Other Ambulatory Visit: Payer: Self-pay

## 2015-01-29 ENCOUNTER — Encounter: Payer: Self-pay | Admitting: *Deleted

## 2015-01-29 DIAGNOSIS — R634 Abnormal weight loss: Secondary | ICD-10-CM | POA: Insufficient documentation

## 2015-01-29 DIAGNOSIS — E46 Unspecified protein-calorie malnutrition: Secondary | ICD-10-CM | POA: Insufficient documentation

## 2015-01-29 DIAGNOSIS — C099 Malignant neoplasm of tonsil, unspecified: Secondary | ICD-10-CM | POA: Diagnosis not present

## 2015-01-29 DIAGNOSIS — R638 Other symptoms and signs concerning food and fluid intake: Secondary | ICD-10-CM

## 2015-01-29 DIAGNOSIS — R202 Paresthesia of skin: Secondary | ICD-10-CM

## 2015-01-29 DIAGNOSIS — E860 Dehydration: Secondary | ICD-10-CM

## 2015-01-29 MED ORDER — SODIUM CHLORIDE 0.9 % IV SOLN: Freq: Once | INTRAVENOUS | Status: DC

## 2015-01-29 MED ORDER — SODIUM CHLORIDE 0.9 % IV SOLN
Freq: Once | INTRAVENOUS | Status: DC
  Filled 2015-01-29: qty 1000

## 2015-01-29 MED ORDER — SODIUM CHLORIDE 0.9 % IV SOLN
Freq: Once | INTRAVENOUS | Status: AC
  Administered 2015-01-29: 13:00:00 via INTRAVENOUS
  Filled 2015-01-29: qty 1000

## 2015-01-29 NOTE — Progress Notes (Signed)
Patient presents to Radiation Oncology department for IV placement and IV fluids for hydration.   #22 g PIV placed in left AC by [name], NP with assistance of vein map machine. We will plan to leave this PIV in place for the remainder of the week, given the patient's poor venous access.   Patient is scheduled for the following (a calendar has been provided to the patient and his wife so they are aware of these appointments):   -Wed. 01/30/15-IV fluids in rad onc -Thurs. 01/31/15-Scheduled to see ENT MD-Dr. Erik Obey -Fri. 02/01/15-Labs and then IV fluids in rad onc  Patient and wife are aware with this plan of care and agree with the above.  I encouraged them to call me with any questions or concerns.  Rad Onc Nursing is aware of this plan as well.   [name], NP [place] [phone]

## 2015-01-30 ENCOUNTER — Encounter: Payer: Self-pay | Admitting: Adult Health

## 2015-01-30 ENCOUNTER — Ambulatory Visit: Payer: Medicare Other | Admitting: Nutrition

## 2015-01-30 ENCOUNTER — Ambulatory Visit
Admission: RE | Admit: 2015-01-30 | Discharge: 2015-01-30 | Disposition: A | Discharge status: Home / Self Care | Payer: Medicare Other | Source: Ambulatory Visit | Attending: Radiation Oncology | Admitting: Radiation Oncology

## 2015-01-30 DIAGNOSIS — C099 Malignant neoplasm of tonsil, unspecified: Secondary | ICD-10-CM | POA: Diagnosis not present

## 2015-01-30 MED ORDER — SODIUM CHLORIDE 0.9 % IV SOLN
Freq: Once | INTRAVENOUS | Status: AC
  Administered 2015-01-30: 12:00:00 via INTRAVENOUS
  Filled 2015-01-30: qty 1000

## 2015-01-30 NOTE — Progress Notes (Signed)
I briefly spoke with Kevin Mcclain and his wife today during his IV fluid infusion.  He reports that today is a "down" day and isn't feeling as well as he felt yesterday.  No specific concerns identified. I reinforced the plan for him to return to the cancer center on Friday for labs and his hopeful last day of IV fluids.  He stated that he prefers to have his current PIV removed, as it is uncomfortable in the bend of his arm.  Made RN aware of patient's wishes.  PIV removed by RN prior to pt leaving clinic today.  Encouraged him to call me, if needed, before we see him again on Friday.   [name], NP [place] [phone]

## 2015-01-30 NOTE — Progress Notes (Signed)
Nutrition follow-up completed with patient during IV fluids. Weight decreased and documented as 127 pounds November 28. Patient still has difficulty consuming oral nutrition supplements. Reports he feels "too full" after IV fluids. Patient refuses offer to provide him with something to drink while he is receiving fluids.  Nutrition diagnosis: Unintended weight loss continues.  Intervention: Provided supportive, active listening. Stressed importance of patient increasing oral intake. Provided additional samples of Carnation very high calorie.  Monitoring, evaluation, goals: Patient will work to increase calories and protein to promote healing and minimize further weight loss.  Next visit: I will continue to work with patient as needed.  **Disclaimer: This note was dictated with voice recognition software. Similar sounding words can inadvertently be transcribed and this note may contain transcription errors which may not have been corrected upon publication of note.**

## 2015-01-31 NOTE — Progress Notes (Signed)
  Oncology Nurse Navigator Documentation   Navigator Encounter Type: Other (01/29/15 1030) Patient Visit Type: Follow-up (01/29/15 1030)     Met with patient prior to IVF administration in [place].  He was accompanied by his wife. He reported:  "Doing much better", IVF helpful.  Denied pain.  Able to eat/drink small amounts. He did not express any needs or concerns at this time, I encouraged him/wife to contact me if that changes, they verbalized understanding.  Gayleen Orem, RN, BSN, [place] [facility] [phone]                     Time Spent with Patient: 15 (01/29/15 1030)

## 2015-02-01 ENCOUNTER — Ambulatory Visit
Admission: RE | Admit: 2015-02-01 | Discharge: 2015-02-01 | Disposition: A | Discharge status: Home / Self Care | Payer: Medicare Other | Source: Ambulatory Visit | Attending: Radiation Oncology | Admitting: Radiation Oncology

## 2015-02-01 ENCOUNTER — Encounter: Payer: Self-pay | Admitting: Adult Health

## 2015-02-01 ENCOUNTER — Other Ambulatory Visit: Payer: Self-pay

## 2015-02-01 ENCOUNTER — Other Ambulatory Visit (HOSPITAL_BASED_OUTPATIENT_CLINIC_OR_DEPARTMENT_OTHER): Payer: Medicare Other

## 2015-02-01 DIAGNOSIS — C099 Malignant neoplasm of tonsil, unspecified: Secondary | ICD-10-CM | POA: Diagnosis not present

## 2015-02-01 DIAGNOSIS — R638 Other symptoms and signs concerning food and fluid intake: Secondary | ICD-10-CM

## 2015-02-01 DIAGNOSIS — E860 Dehydration: Secondary | ICD-10-CM

## 2015-02-01 LAB — COMPREHENSIVE METABOLIC PANEL
ALBUMIN: 3.7 g/dL (ref 3.5–5.0)
ALK PHOS: 94 U/L (ref 40–150)
ALT: 46 U/L (ref 0–55)
ANION GAP: 11 meq/L (ref 3–11)
AST: 29 U/L (ref 5–34)
BILIRUBIN TOTAL: 0.76 mg/dL (ref 0.20–1.20)
BUN: 15.5 mg/dL (ref 7.0–26.0)
CALCIUM: 9.8 mg/dL (ref 8.4–10.4)
CO2: 26 mEq/L (ref 22–29)
Chloride: 102 mEq/L (ref 98–109)
Creatinine: 0.9 mg/dL (ref 0.7–1.3)
EGFR: 81 mL/min/{1.73_m2} — AB (ref >90)
Glucose: 237 mg/dl — ABNORMAL HIGH (ref 70–140)
Potassium: 4.1 mEq/L (ref 3.5–5.1)
Sodium: 139 mEq/L (ref 136–145)
TOTAL PROTEIN: 6.9 g/dL (ref 6.4–8.3)

## 2015-02-01 MED ORDER — SODIUM CHLORIDE 0.9 % IV SOLN
Freq: Once | INTRAVENOUS | Status: AC
  Administered 2015-02-01: 12:00:00 via INTRAVENOUS
  Filled 2015-02-01: qty 1000

## 2015-02-01 NOTE — Progress Notes (Signed)
#  22g in left AC started at 11:30am by Elzie Rings, NP.  (+) blood return noted. No difficulty with flushing and no evidence of phlebitis.  Pt tolerated IV insertion without difficulty.   Pt to receive 1 L NS over 2 hours @ 500 ml/hr.  Harlow Asa, RN aware and will monitor patient as needed.   [name], NP [place] [phone]

## 2015-02-01 NOTE — Progress Notes (Signed)
[name] seen for lab evaluation and consideration for IV fluids.  PIV placed by Elzie Rings, NP for hydration.    Below are the patient's lab results for today:   CMP Latest Ref Rng 02/01/2015 01/28/2015 01/21/2015  Glucose 70 - 140 mg/dl 237(H) 148(H) 129  BUN 7.0 - 26.0 mg/dL 15.5 13.8 10.4  Creatinine 0.7 - 1.3 mg/dL 0.9 0.9 0.9  Sodium 136 - 145 mEq/L 139 139 140  Potassium 3.5 - 5.1 mEq/L 4.1 4.5 4.1  Chloride 101 - 111 mmol/L - - -  CO2 22 - 29 mEq/L 26 29 29   Calcium 8.4 - 10.4 mg/dL 9.8 10.1 10.3  Total Protein 6.4 - 8.3 g/dL 6.9 6.9 -  Total Bilirubin 0.20 - 1.20 mg/dL 0.76 0.62 -  Alkaline Phos 40 - 150 U/L 94 81 -  AST 5 - 34 U/L 29 36(H) -  ALT 0 - 55 U/L 46 42 -    His BUN is 15.5 (increased slightly from 13.8 on 01/28/15), but creatinine remains stable at 0.9.  His serum potassium is normal at 4.1 today.  He will receive 1 L normal saline over 2 hours (at 500 ml/hr) as ordered.    He will return to clinic on Monday, 02/04/15 at 11am for labs, 11:30am follow-up with me, and 12 pm IV fluids for hydration.  If his potassium is less than 4.0 on Monday, then I will replete his potassium given his decreased po intake at present.

## 2015-02-04 ENCOUNTER — Ambulatory Visit (HOSPITAL_BASED_OUTPATIENT_CLINIC_OR_DEPARTMENT_OTHER): Payer: Medicare Other | Admitting: Adult Health

## 2015-02-04 ENCOUNTER — Other Ambulatory Visit: Payer: Self-pay | Admitting: Adult Health

## 2015-02-04 ENCOUNTER — Encounter: Payer: Self-pay | Admitting: Adult Health

## 2015-02-04 ENCOUNTER — Other Ambulatory Visit (HOSPITAL_BASED_OUTPATIENT_CLINIC_OR_DEPARTMENT_OTHER): Payer: Medicare Other

## 2015-02-04 ENCOUNTER — Ambulatory Visit
Admission: RE | Admit: 2015-02-04 | Discharge: 2015-02-04 | Disposition: A | Discharge status: Home / Self Care | Payer: Medicare Other | Source: Ambulatory Visit | Attending: Radiation Oncology | Admitting: Radiation Oncology

## 2015-02-04 VITALS — BP 156/85 | HR 103 | Temp 97.9°F | Resp 16 | Wt 125.6 lb

## 2015-02-04 DIAGNOSIS — E876 Hypokalemia: Secondary | ICD-10-CM

## 2015-02-04 DIAGNOSIS — C099 Malignant neoplasm of tonsil, unspecified: Secondary | ICD-10-CM

## 2015-02-04 DIAGNOSIS — R5383 Other fatigue: Secondary | ICD-10-CM

## 2015-02-04 DIAGNOSIS — B370 Candidal stomatitis: Secondary | ICD-10-CM | POA: Diagnosis not present

## 2015-02-04 DIAGNOSIS — R070 Pain in throat: Secondary | ICD-10-CM

## 2015-02-04 DIAGNOSIS — E860 Dehydration: Secondary | ICD-10-CM

## 2015-02-04 LAB — COMPREHENSIVE METABOLIC PANEL
ALBUMIN: 3.6 g/dL (ref 3.5–5.0)
ALK PHOS: 79 U/L (ref 40–150)
ALT: 49 U/L (ref 0–55)
AST: 41 U/L — AB (ref 5–34)
Anion Gap: 10 mEq/L (ref 3–11)
BUN: 11.7 mg/dL (ref 7.0–26.0)
CHLORIDE: 102 meq/L (ref 98–109)
CO2: 27 mEq/L (ref 22–29)
Calcium: 10.1 mg/dL (ref 8.4–10.4)
Creatinine: 0.9 mg/dL (ref 0.7–1.3)
EGFR: 87 mL/min/{1.73_m2} — ABNORMAL LOW (ref >90)
GLUCOSE: 164 mg/dL — AB (ref 70–140)
POTASSIUM: 3.6 meq/L (ref 3.5–5.1)
SODIUM: 139 meq/L (ref 136–145)
Total Bilirubin: 0.94 mg/dL (ref 0.20–1.20)
Total Protein: 6.7 g/dL (ref 6.4–8.3)

## 2015-02-04 LAB — CBC WITH DIFFERENTIAL/PLATELET
BASO%: 0.8 % (ref 0.0–2.0)
BASOS ABS: 0 10*3/uL (ref 0.0–0.1)
EOS%: 2.9 % (ref 0.0–7.0)
Eosinophils Absolute: 0.2 10*3/uL (ref 0.0–0.5)
HEMATOCRIT: 40.9 % (ref 38.4–49.9)
HEMOGLOBIN: 13.9 g/dL (ref 13.0–17.1)
LYMPH#: 0.3 10*3/uL — AB (ref 0.9–3.3)
LYMPH%: 6.1 % — ABNORMAL LOW (ref 14.0–49.0)
MCH: 30.5 pg (ref 27.2–33.4)
MCHC: 34 g/dL (ref 32.0–36.0)
MCV: 89.7 fL (ref 79.3–98.0)
MONO#: 0.4 10*3/uL (ref 0.1–0.9)
MONO%: 7.8 % (ref 0.0–14.0)
NEUT%: 82.4 % — ABNORMAL HIGH (ref 39.0–75.0)
NEUTROS ABS: 4.7 10*3/uL (ref 1.5–6.5)
Platelets: 221 10*3/uL (ref 140–400)
RBC: 4.56 10*6/uL (ref 4.20–5.82)
RDW: 14.1 % (ref 11.0–14.6)
WBC: 5.7 10*3/uL (ref 4.0–10.3)

## 2015-02-04 MED ORDER — NYSTATIN 100000 UNIT/ML MT SUSP: 5.0000 mL | Freq: Four times a day (QID) | OROMUCOSAL | Status: DC

## 2015-02-04 MED ORDER — HYDROCODONE-ACETAMINOPHEN 7.5-325 MG/15ML PO SOLN: ORAL | Status: DC

## 2015-02-04 MED ORDER — POTASSIUM CHLORIDE 20 MEQ/15ML (10%) PO SOLN: 20.0000 meq | Freq: Two times a day (BID) | ORAL | Status: AC

## 2015-02-04 NOTE — Progress Notes (Signed)
CLINIC:  Survivorship  REASON FOR VISIT:  Routine follow-up for head & neck cancer and to address post-treatment acute survivorship needs.   BRIEF ONCOLOGIC HISTORY:  Oncology History   Tonsil cancer   Staging form: Pharynx - Oropharynx, AJCC 7th Edition     Clinical: Stage II (T2, N0, M0) - Signed by Eppie Gibson, MD on 10/31/2014       Tonsil cancer ([place])   12/04/2013 Miscellaneous TSH: 2.65; no h/o hypothyroidism.   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    11/06/2014 Imaging CT neck: Right palatine tonsil mass measuring 2.1 x 2.1 x 2.5 cm.  Asymmetric right LNs in level 1B, 2, and level 3.    12/03/2014 - 01/18/2015 Radiation Therapy Right tonsil and bilateral neck / 70 Gy in 35 fractions to gross disease, 63 Gy in 35 fractions to high risk nodal echelons, and 56 Gy in 35 fractions to intermediate risk nodal echelons   01/06/2015 Miscellaneous ER VISIT for dysphagia.  No hospital admission. CT neck done and pt was able to tolerate po liquids afer IV zofran. Discharged home.    01/06/2015 Imaging CT neck: Significant interval treatment response with no residual measurable right tonsil mass. Mild mucosal thickening and hyperenhancement in bilateral oropharynx & epiglottis, likely tx effect. No cervical adenpathy.     INTERVAL HISTORY:  Mr. Woolson presents today for routine follow-up visit for acute survivorship.  He is still struggling with some oral thrush and "burning" pain.  However, he states that he feels that overall his pain is slowly improving, but he is still requiring the liquid Hydrocodone and Fentanyl patch. (note: patch was changed today per pt's wife and he has 1 patch left before he will need  refill). He has been able to tolerate about 10 oz of nutrition supplement "shakes" (he mixes high-calorie formula, chocolate milk, and Boost Plus) at a time.  He has lost an additional ~2 lbs since his last office visit 1 week ago.  He received IV fluids (1 liter normal saline at each visit) in clinic last week x 3 days [Tuesday, Wednesday, and Friday].    Pain: Throat pain; 5-6/10 at rest; 8-9/10 when swallowing.  Continuing to use Hydrocodone 30 ml every 4 hours "around the clock"; also has 50 mcg Fentanyl patch.    Nutritional Status:  -Intake/Diet: 10 oz of Boost "shakes" twice daily.  -Using a feeding tube? No -Weight change: Loss (~2 lbs) since 01/28/15  Swallowing:  Able to tolerate liquids. Reports occasional vomiting, mostly from "gagging and swallowing all of that thick mucus."  Last ENT visit: [id number] [facility] pt, he is doing very well. No new concerns.   Last Dentist visit:  12/18/14 ([facility])-continue oral hygiene with fluoride at bedtime; trismus exercises; RTC in 2 months for post-RT oral exam.  Summary of last Med Onc visit: 10/31/14-Concurrent chemo not necessary.   Summary of last Rad Onc visit:  01/21/15-pain, weight loss, dehydration, dysphagia. Follow-up with Dr. Isidore Moos in 05/2015 with PET.   Last TSH value: 12/04/13-TSH 2.65  Last Imaging:  01/06/15-CT neck: Significant interval treatment response with no residual measurable right tonsil mass. Mild mucosal thickening and hyperenhancement in bilateral oropharynx & epiglottis, likely tx effect. No cervical adenpathy.    ADDITIONAL REVIEW OF  SYSTEMS:  Review of Systems  Constitutional: Positive for weight loss and malaise/fatigue.       -Endorses fatigue and weakness. -Reports additional ~3 lb weight loss in past week.   HENT: Positive for sore throat.        -Reports bilateral jaw pain. Is still unable to do trismus exercises.  -Reports that he has 1-2 more pills left of the Diflucan (although he mentioned having that many  pills left 1 week ago?); remains with some tongue and upper hard palate pain/burning, and tongue coating.    Respiratory: Positive for sputum production.        -Oropharyngeal secretion volume is improving; sometimes is pink-tinged.  Gastrointestinal: Negative for diarrhea and constipation.       Continues to have BM daily or every other day without concerns.      CURRENT MEDICATIONS:  Current Outpatient Prescriptions on File Prior to Visit  Medication Sig Dispense Refill  . allopurinol (ZYLOPRIM) 300 MG tablet TAKE 1 TABLET BY MOUTH EVERY DAY 90 tablet 1  . amLODipine (NORVASC) 10 MG tablet TAKE 1 TABLET BY MOUTH EVERY DAY 90 tablet 1  . aspirin EC 325 MG tablet Take 325 mg by mouth daily.    Marland Kitchen atorvastatin (LIPITOR) 20 MG tablet TAKE 1 TABLET BY MOUTH EVERY DAY 90 tablet 1  . colchicine 0.6 MG tablet Take 1 tablet (0.6 mg total) by mouth daily as needed. (Patient taking differently: Take 0.6 mg by mouth daily as needed (gout flare). ) 90 tablet 1  . desoximetasone (TOPICORT) 0.25 % cream APPLY TOPICALLY TWICE DAILY (Patient not taking: Reported on 12/24/2014) 30 g 5  . fentaNYL (DURAGESIC - DOSED MCG/HR) 50 MCG/HR Apply 1 patch q 3 days to skin. 5 patch 0  . fluconazole (DIFLUCAN) 100 MG tablet Take 2 tablets today, then 1 tablet daily x 20 more days. Hold atorvastatin and colchicine while on this Rx 22 tablet 0  . lidocaine (XYLOCAINE) 2 % solution Mix 1 part 2%viscous lidocaine,1part H2O.Swish and/or swallow 62ml of this mixture,65min before meals and at bedtime, up to QID 100 mL 5  . metFORMIN (GLUCOPHAGE) 500 MG tablet TAKE 1 TABLET BY MOUTH 3 TIMES A DAY (Patient taking differently: TAKE 1 TABLET BY MOUTH WITH BREAKFAST AND TAKE 2 TABLETS BY MOUTH AT 9PM) 270 tablet 1  . metoprolol succinate (TOPROL-XL) 50 MG 24 hr tablet TAKE 1 TABLET BY MOUTH DAILY. TAKE WITH OR IMMEDIATELY FOLLOWING A MEAL (Patient taking differently: TAKE 1 TABLET BY MOUTH DAILY WITH BREAKFAST) 90 tablet 1  .  nitroGLYCERIN (NITROSTAT) 0.4 MG SL tablet Place 1 tablet (0.4 mg total) under the tongue every 5 (five) minutes as needed. 50 tablet 11  . ondansetron (ZOFRAN-ODT) 8 MG disintegrating tablet Take 1 tablet (8 mg total) by mouth every 8 (eight) hours as needed for nausea or vomiting. 30 tablet 5  . OVER THE COUNTER MEDICATION Place 1 drop into both eyes 3 (three) times daily as needed (dry eyes/ irritation). Clear Cooling Eye Drops    . promethazine (PHENERGAN) 25 MG tablet Take 1 tablet (25 mg total) by mouth every 6 (six) hours as needed for nausea or vomiting. (Patient not taking: Reported on 01/06/2015) 60 tablet 2  . ramipril (ALTACE) 5 MG capsule TAKE 2 CAPSULES BY MOUTH EVERY DAY 180 capsule 1  . ranitidine (ZANTAC) 75 MG tablet Take 75 mg by mouth daily as needed for heartburn (acid reflux).     Marland Kitchen senna (SENOKOT) 8.6 MG TABS tablet Take  1 tablet (8.6 mg total) by mouth at bedtime as needed for mild constipation. 30 each 1  . sodium fluoride (FLUORISHIELD) 1.1 % GEL dental gel Instill one drop of gel per tooth space of fluoride tray. Place over teeth for 5 minutes. Remove. Spit out excess. Repeat nightly. 120 mL prn  . sulindac (CLINORIL) 200 MG tablet Take 0.5 tablets (100 mg total) by mouth daily as needed. (Patient not taking: Reported on 01/06/2015) 45 tablet 1   Current Facility-Administered Medications on File Prior to Visit  Medication Dose Route Frequency Provider Last Rate Last Dose  . 0.9 %  sodium chloride infusion   Intravenous Once Holley Bouche, NP      . 0.9 %  sodium chloride infusion   Intravenous Once Holley Bouche, NP        ALLERGIES:  Allergies  Allergen Reactions  . Benzonatate Swelling    "swells my throat"  . Codeine Itching  . Sitagliptin Phosphate Itching     PHYSICAL EXAM:  Filed Vitals:   02/04/15 1120  BP: 156/85  Pulse: 103  Temp: 97.9 F (36.6 C)  Resp: 16   **Pt stated that he forgot to take his Metoprolol today.  Weight Date  125 lb  9.6 oz (56.972 kg) 02/04/15  127 lb 14.4 oz (58.015 kg) 01/28/15  130 lb 6.4 oz (59.149 kg) 01/21/15  132 lb 3.2 oz (59.966 kg) 01/14/15  136 lb 12.8 oz (62.052 kg) 01/07/15  142 lb 12.8 oz (64.774 kg) 12/24/14  154 lb 9.6 oz (70.126 kg) Pre-treatment: 10/31/14   General: Male in no acute distress.  Accompanied by his wife today.  HEENT: Head is atraumatic and normocephalic.  Pupils equal and reactive to light. Conjunctivae clear without exudate.  Sclerae anicteric. Bilateral tympanic membranes visualized on otoscopic exam; no redness or bulging noted. No redness or exudate in external auditory canal. Oral mucosa is pink and moist. Saliva is thick.  Tongue pink, moist with diffuse white, thrush coating. Hard palate, directly posterior to teeth, with white coating and healing ulcers.  Oropharynx is red with lesions. Posterior oropharynx difficult to visualize as patient with difficulty opening his mouth wide enough for exam.  (+) trismus.  Lymph: No cervicalor supraclavicular lymphadenopathy noted on palpation.   Neck: No palpable masses. Skin on neck is mildly erythematous and dry. No open lesions.  Skin in radiation treatment fields is continuing to progress towards healing.   Cardiovascular: Normal rate and rhythm. Respiratory: Clear to auscultation bilaterally. Chest expansion symmetric without accessory muscle use on inspiration or expiration.   GI: Abdomen soft and round. No tenderness to palpation. Bowel sounds mildly hypoactive. GU: Deferred.   Neuro: No focal deficits. Steady gait.   Psych: Normal mood and affect for situation. Extremities: No edema, cyanosis, or clubbing.   Skin: Warm and dry.    LABORATORY DATA:  CBC    Component Value Date/Time   WBC 5.7 02/04/2015 1106   WBC 10.3 01/06/2015 1422   RBC 4.56 02/04/2015 1106   RBC 4.90 01/06/2015 1422   HGB 13.9 02/04/2015 1106   HGB 15.2 01/06/2015 1422   HCT 40.9 02/04/2015 1106   HCT 43.3 01/06/2015 1422   PLT 221  02/04/2015 1106   PLT 267 01/06/2015 1422   MCV 89.7 02/04/2015 1106   MCV 88.4 01/06/2015 1422   MCH 30.5 02/04/2015 1106   MCH 31.0 01/06/2015 1422   MCHC 34.0 02/04/2015 1106   MCHC 35.1 01/06/2015 1422   RDW 14.1  02/04/2015 1106   RDW 12.9 01/06/2015 1422   LYMPHSABS 0.3* 02/04/2015 1106   LYMPHSABS 0.5* 01/06/2015 1422   MONOABS 0.4 02/04/2015 1106   MONOABS 0.3 01/06/2015 1422   EOSABS 0.2 02/04/2015 1106   EOSABS 0.0 01/06/2015 1422   BASOSABS 0.0 02/04/2015 1106   BASOSABS 0.0 01/06/2015 1422    BMET    Component Value Date/Time   NA 139 02/04/2015 1106   NA 138 01/06/2015 1422   K 3.6 02/04/2015 1106   K 4.5 01/06/2015 1422   CL 100* 01/06/2015 1422   CO2 27 02/04/2015 1106   CO2 21* 01/06/2015 1422   GLUCOSE 164* 02/04/2015 1106   GLUCOSE 185* 01/06/2015 1422   BUN 11.7 02/04/2015 1106   BUN 27* 01/06/2015 1422   CREATININE 0.9 02/04/2015 1106   CREATININE 1.02 01/06/2015 1422   CREATININE 0.81 09/19/2010 1339   CALCIUM 10.1 02/04/2015 1106   CALCIUM 10.4* 01/06/2015 1422   GFRNONAA >60 01/06/2015 1422   GFRAA >60 01/06/2015 1422    I have reviewed all the lab results. There are some abnormalities that are not critical to the patient's health. I reviewed the labs today, in detail, with the patient and his wife.    DIAGNOSTIC IMAGING:  None at this visit.     ASSESSMENT & PLAN:  Mr. Delarocha is a pleasant 69 y.o. male with history of cancer of the right tonsil, treated with IMRT alone; completed treatment on 01/18/15.  Patient presents to survivorship clinic today for routine follow-up and to address acute survivorship needs.   1. Cancer of the right tonsil: Mr. Nau is continuing to recover from definitive radiation therapy to his head & neck.  The patient will see Dr. Isidore Moos on 05/02/2015 for surveillance.  PET scan will be done and results reviewed with patient by Dr. Isidore Moos at that visit in 05/2015.    2. Decreased oral intake due to recent  radiation therapy:  Mr. Romanowski is progressing towards healing and is beginning to be able to tolerate increased oral intake.  Given that his labs are stable today, without IV fluid intervention over the weekend, we will not give him fluids today.  I encouraged him to continue to increase and push oral intake, as tolerated, with both water/any decaffeinated beverages, as well as his nutritional supplements.  Increased protein and calories are of utmost importance in his continued healing.  We will see how he does over the next few days with regards to oral intake.  I will call the patient on Wednesday, to check in on his symptoms, intake, and weight.  If he continues to remain stable, then I will bring him in on Friday for further evaluation.  My hope is that we can begin to space out the time between appointments, as his healing and symptoms are continuing to improve.   3. Hypokalemia:  His serum potassium is borderline low at 3.6 today. Given his decreased oral intake and his significant cardiac history, my goal for his serum potassium is >4.0.  Therefore, in lieu of IV fluids with potassium today, we decided to replete his potassium orally with liquid KCl 20 meQ BID through Thursday, 02/07/15. He will have his first dose today after picking up the prescription from the outpatient pharmacy.  I gave them instructions on how to dilute this medication and also how they can mix this medication with a little bit of Boost to help with the flavor and potential burning of the medication by mouth.  Mr. Dewinter agrees to adhere to oral potassium, since this will be in lieu of parenteral supplementation. [Of note, I spoke with the PharmD in our pharmacy directly, who assured me that the alerted interaction between benzonate and oral potassium supplementation was not cause for alarm for this patient and he should not be at risk for anaphylaxis/allergic reaction when taking the potassium supplementation.]  4.   Throat pain related to treatment:  I refilled the hydrocodone liquid today [Hydrocodone-acetaminophin 7.5-325/15 ml solution; Take 15-30 ml Q4Hprn, #1084ml; no refills].  My hope is that we will be able to begin to decrease the dosage of the hydrocodone liquid, as well as the Fentanyl patch in the coming weeks as his healing continues.   5. Oral candidiasis and good oral hygiene: Mr. Warfel continues to have oral thrush despite treatment with Diflucan.  However, I am not clear if the patient took the course of the Diflucan as prescribed.  I encouraged him to finish the course of Diflucan, but I have also ordered him Nystatin suspension to do 4x/day swish and spit for the next 3 days to see if that improves his symptoms.  We also discussed the importance of good oral hygiene to help with eliminating oral thrush.  I provided education on proper oral hygiene, particularly after consuming his Boost shakes, to help decrease the risk of continued fungal infections in the mouth.  He expressed verbal understanding.    Pending the results of the patient's upcoming staging PET scan, he will be eligible to see me in the survivorship clinic in the future for a survivorship care plan review visit, where we will discuss potential late/long-term side effects of treatment, additional follow-up/surveillance appointments with his cancer team, and offer continued social and emotional support.  I look forward to continuing to participate in this patient's care.   A total of 30 minutes was spent with this patient, with greater than 50% of that time in counseling and care-coordination.    [name], NP [place] [phone]

## 2015-02-06 ENCOUNTER — Telehealth: Payer: Self-pay | Admitting: Adult Health

## 2015-02-06 ENCOUNTER — Other Ambulatory Visit: Payer: Self-pay | Admitting: Adult Health

## 2015-02-06 DIAGNOSIS — E860 Dehydration: Secondary | ICD-10-CM

## 2015-02-06 DIAGNOSIS — C099 Malignant neoplasm of tonsil, unspecified: Secondary | ICD-10-CM

## 2015-02-06 NOTE — Telephone Encounter (Signed)
Attempted to reach patient to follow-up on how he is feeling.  No answer on phone number called and no voicemail available to leave a message. Will try to reach him again later.   [name], NP [place] [phone]

## 2015-02-06 NOTE — Telephone Encounter (Signed)
Mrs. Henige returned my call and let me know that she would like Mr. Lofgreen to come in tomorrow to see me because he has continued to have pain and has been unable to tolerate much by mouth (boost or fluids).  She states that the pharmacy did not have the potassium in stock for him on Monday and had to order it, so they were unable to pick up the prescription.  He has not had any potassium repletion.  Mrs. Yanik thinks that he may need some IV fluids tomorrow.   I have scheduled the patient for CMP lab, visit with me, and nurse eval for IV fluids for 02/07/15.  Mrs. Bridewell is aware of these appts and agrees with this plan. I will see them tomorrow.   [name], NP [place] [phone]

## 2015-02-07 ENCOUNTER — Other Ambulatory Visit: Payer: Self-pay | Admitting: Adult Health

## 2015-02-07 ENCOUNTER — Encounter: Payer: Self-pay | Admitting: Adult Health

## 2015-02-07 ENCOUNTER — Other Ambulatory Visit (HOSPITAL_BASED_OUTPATIENT_CLINIC_OR_DEPARTMENT_OTHER): Payer: Medicare Other

## 2015-02-07 ENCOUNTER — Ambulatory Visit (HOSPITAL_BASED_OUTPATIENT_CLINIC_OR_DEPARTMENT_OTHER): Payer: Medicare Other | Admitting: Adult Health

## 2015-02-07 ENCOUNTER — Ambulatory Visit
Admission: RE | Admit: 2015-02-07 | Discharge: 2015-02-07 | Disposition: A | Discharge status: Home / Self Care | Payer: Medicare Other | Source: Ambulatory Visit | Attending: Radiation Oncology | Admitting: Radiation Oncology

## 2015-02-07 ENCOUNTER — Other Ambulatory Visit: Payer: Self-pay | Admitting: *Deleted

## 2015-02-07 VITALS — BP 132/70 | HR 76 | Wt 124.6 lb

## 2015-02-07 DIAGNOSIS — B370 Candidal stomatitis: Secondary | ICD-10-CM

## 2015-02-07 DIAGNOSIS — R112 Nausea with vomiting, unspecified: Secondary | ICD-10-CM | POA: Diagnosis not present

## 2015-02-07 DIAGNOSIS — C099 Malignant neoplasm of tonsil, unspecified: Secondary | ICD-10-CM

## 2015-02-07 DIAGNOSIS — E860 Dehydration: Secondary | ICD-10-CM

## 2015-02-07 DIAGNOSIS — R070 Pain in throat: Secondary | ICD-10-CM | POA: Diagnosis not present

## 2015-02-07 DIAGNOSIS — R638 Other symptoms and signs concerning food and fluid intake: Secondary | ICD-10-CM

## 2015-02-07 LAB — COMPREHENSIVE METABOLIC PANEL
ALT: 50 U/L (ref 0–55)
AST: 35 U/L — AB (ref 5–34)
Albumin: 3.9 g/dL (ref 3.5–5.0)
Alkaline Phosphatase: 91 U/L (ref 40–150)
Anion Gap: 14 mEq/L — ABNORMAL HIGH (ref 3–11)
BILIRUBIN TOTAL: 1.03 mg/dL (ref 0.20–1.20)
BUN: 13.2 mg/dL (ref 7.0–26.0)
CHLORIDE: 100 meq/L (ref 98–109)
CO2: 26 meq/L (ref 22–29)
CREATININE: 0.9 mg/dL (ref 0.7–1.3)
Calcium: 10.1 mg/dL (ref 8.4–10.4)
EGFR: 84 mL/min/{1.73_m2} — AB (ref >90)
GLUCOSE: 143 mg/dL — AB (ref 70–140)
Potassium: 3.7 mEq/L (ref 3.5–5.1)
SODIUM: 139 meq/L (ref 136–145)
TOTAL PROTEIN: 7.1 g/dL (ref 6.4–8.3)

## 2015-02-07 MED ORDER — SODIUM CHLORIDE 0.9 % IV SOLN
Freq: Once | INTRAVENOUS | Status: AC
  Administered 2015-02-07: 12:00:00 via INTRAVENOUS
  Filled 2015-02-07: qty 1000

## 2015-02-07 NOTE — Progress Notes (Signed)
CLINIC:  Survivorship  REASON FOR VISIT:  Routine follow-up for head & neck cancer and to address post-treatment acute survivorship needs.   BRIEF ONCOLOGIC HISTORY:  Oncology History   Tonsil cancer   Staging form: Pharynx - Oropharynx, AJCC 7th Edition     Clinical: Stage II (T2, N0, M0) - Signed by Eppie Gibson, MD on 10/31/2014       Tonsil cancer ([place])   12/04/2013 Miscellaneous TSH: 2.65; no h/o hypothyroidism.   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    11/06/2014 Imaging CT neck: Right palatine tonsil mass measuring 2.1 x 2.1 x 2.5 cm.  Asymmetric right LNs in level 1B, 2, and level 3.    12/03/2014 - 01/18/2015 Radiation Therapy Right tonsil and bilateral neck / 70 Gy in 35 fractions to gross disease, 63 Gy in 35 fractions to high risk nodal echelons, and 56 Gy in 35 fractions to intermediate risk nodal echelons   01/06/2015 Miscellaneous ER VISIT for dysphagia.  No hospital admission. CT neck done and pt was able to tolerate po liquids afer IV zofran. Discharged home.    01/06/2015 Imaging CT neck: Significant interval treatment response with no residual measurable right tonsil mass. Mild mucosal thickening and hyperenhancement in bilateral oropharynx & epiglottis, likely tx effect. No cervical adenpathy.     INTERVAL HISTORY:  Kevin Mcclain presents today for follow-up visit after I received a concerned call from the patient's wife yesterday.  The patient has been able to continue to tolerate his mixture Boost shakes (he mixes high-calorie formula, chocolate milk, and Boost Plus).  He has had some nausea and vomiting within the past few days, which he attributes to the thickened secretions.  He took a  dose of Zofran this morning, which relieved the nausea.  He is still struggling with some oral thrush and "burning" pain.  He is continuing to use the Hydrocodone liquid, as well as the Fentanyl patch.  He states that the Nystatin solution has helped a lot with his mouth pain and oral thrush that was prescribed for him on Monday.  He has lost an additional ~1 lb since his last office visit on Monday.  His potassium was borderline low on Monday and was prescribed liquid oral potassium for repletion, but they were unable to pick up that prescription, and he has not had any of the potassium medication.    Pain: Throat pain; 5-6/10 at rest; 8-9/10 when swallowing, but slowly improving.  Continuing to use Hydrocodone 30 ml every 4 hours "around the clock"; also has 50 mcg Fentanyl patch.    Nutritional Status:  -Intake/Diet: 10 oz of Boost "shakes" twice daily.  Some nausea & vomiting due to thick secretions. -Using a feeding tube? No -Weight change: Loss (~1 lb) since 02/04/15  Swallowing:  Able to tolerate liquids. Pain is main challenge to swallowing.  Last ENT visit: [id number] [facility] pt, he is doing very well. No new concerns.   Last Dentist visit:  12/18/14 ([facility])-continue oral hygiene with fluoride at bedtime; trismus exercises; RTC in 2 months for post-RT oral exam.  Summary of last Med Onc visit: 10/31/14-Concurrent chemo not necessary.   Summary of last Rad Onc visit:  01/21/15-pain, weight loss, dehydration, dysphagia. Follow-up with Dr. Isidore Moos in  05/2015 with PET.   Last TSH value: 12/04/13-TSH 2.65  Last Imaging:  01/06/15-CT neck: Significant interval treatment response with no residual measurable right tonsil mass. Mild mucosal thickening and hyperenhancement in bilateral oropharynx & epiglottis, likely tx effect. No cervical adenpathy.    ADDITIONAL REVIEW OF SYSTEMS:  Review of Systems  Constitutional: Positive for weight loss and malaise/fatigue.       -Improving fatigue.    HENT:  Positive for sore throat.        -Tongue and mouth burning pain/thrush a little better after Nystatin swishes and rinsing out his mouth after drinking the Boost shakes for the past few days.   -Denies continued right ear pain, but reports some intermittent decreased sound in the right ear and some pressure.   Respiratory: Positive for sputum production.        -Oropharyngeal secretion volume is improving, but still very thick.  Gastrointestinal: Positive for nausea and vomiting. Negative for diarrhea and constipation.       -He reports waking up very nauseated today and had 1 episode of vomiting today. -ontinues to have BM daily or every other day without concerns.   Genitourinary: Negative for dysuria.     CURRENT MEDICATIONS:  Current Outpatient Prescriptions on File Prior to Visit  Medication Sig Dispense Refill  . allopurinol (ZYLOPRIM) 300 MG tablet TAKE 1 TABLET BY MOUTH EVERY DAY 90 tablet 1  . amLODipine (NORVASC) 10 MG tablet TAKE 1 TABLET BY MOUTH EVERY DAY 90 tablet 1  . aspirin EC 325 MG tablet Take 325 mg by mouth daily.    Marland Kitchen atorvastatin (LIPITOR) 20 MG tablet TAKE 1 TABLET BY MOUTH EVERY DAY 90 tablet 1  . colchicine 0.6 MG tablet Take 1 tablet (0.6 mg total) by mouth daily as needed. (Patient taking differently: Take 0.6 mg by mouth daily as needed (gout flare). ) 90 tablet 1  . desoximetasone (TOPICORT) 0.25 % cream APPLY TOPICALLY TWICE DAILY (Patient not taking: Reported on 12/24/2014) 30 g 5  . fentaNYL (DURAGESIC - DOSED MCG/HR) 50 MCG/HR Apply 1 patch q 3 days to skin. 5 patch 0  . fluconazole (DIFLUCAN) 100 MG tablet Take 2 tablets today, then 1 tablet daily x 20 more days. Hold atorvastatin and colchicine while on this Rx 22 tablet 0  . HYDROcodone-acetaminophen (HYCET) 7.5-325 mg/15 ml solution Take 15-87mL every 4 hrs PRN pain. Do not exceed 135mL over 24 hours. 1000 mL 0  . lidocaine (XYLOCAINE) 2 % solution Mix 1 part 2%viscous lidocaine,1part H2O.Swish and/or  swallow 42ml of this mixture,74min before meals and at bedtime, up to QID 100 mL 5  . metFORMIN (GLUCOPHAGE) 500 MG tablet TAKE 1 TABLET BY MOUTH 3 TIMES A DAY (Patient taking differently: TAKE 1 TABLET BY MOUTH WITH BREAKFAST AND TAKE 2 TABLETS BY MOUTH AT 9PM) 270 tablet 1  . metoprolol succinate (TOPROL-XL) 50 MG 24 hr tablet TAKE 1 TABLET BY MOUTH DAILY. TAKE WITH OR IMMEDIATELY FOLLOWING A MEAL (Patient taking differently: TAKE 1 TABLET BY MOUTH DAILY WITH BREAKFAST) 90 tablet 1  . nitroGLYCERIN (NITROSTAT) 0.4 MG SL tablet Place 1 tablet (0.4 mg total) under the tongue every 5 (five) minutes as needed. 50 tablet 11  . nystatin (MYCOSTATIN) 100000 UNIT/ML suspension Take 5 mLs (500,000 Units total) by mouth 4 (four) times daily. 60 mL 0  . ondansetron (ZOFRAN-ODT) 8 MG disintegrating tablet Take 1 tablet (8 mg total) by mouth every 8 (eight) hours as needed for nausea or vomiting. 30 tablet  5  . OVER THE COUNTER MEDICATION Place 1 drop into both eyes 3 (three) times daily as needed (dry eyes/ irritation). Clear Cooling Eye Drops    . potassium chloride 20 MEQ/15ML (10%) SOLN Take 15 mLs (20 mEq total) by mouth 2 (two) times daily. 450 mL 0  . promethazine (PHENERGAN) 25 MG tablet Take 1 tablet (25 mg total) by mouth every 6 (six) hours as needed for nausea or vomiting. (Patient not taking: Reported on 01/06/2015) 60 tablet 2  . ramipril (ALTACE) 5 MG capsule TAKE 2 CAPSULES BY MOUTH EVERY DAY 180 capsule 1  . ranitidine (ZANTAC) 75 MG tablet Take 75 mg by mouth daily as needed for heartburn (acid reflux).     Marland Kitchen senna (SENOKOT) 8.6 MG TABS tablet Take 1 tablet (8.6 mg total) by mouth at bedtime as needed for mild constipation. 30 each 1  . sodium fluoride (FLUORISHIELD) 1.1 % GEL dental gel Instill one drop of gel per tooth space of fluoride tray. Place over teeth for 5 minutes. Remove. Spit out excess. Repeat nightly. 120 mL prn  . sulindac (CLINORIL) 200 MG tablet Take 0.5 tablets (100 mg total) by  mouth daily as needed. (Patient not taking: Reported on 01/06/2015) 45 tablet 1   Current Facility-Administered Medications on File Prior to Visit  Medication Dose Route Frequency Provider Last Rate Last Dose  . 0.9 %  sodium chloride infusion   Intravenous Once Holley Bouche, NP      . 0.9 %  sodium chloride infusion   Intravenous Once Holley Bouche, NP        ALLERGIES:  Allergies  Allergen Reactions  . Benzonatate Swelling    "swells my throat"  . Codeine Itching  . Sitagliptin Phosphate Itching     IV FLUID ADMINISTRATION RECORD: Date Volume & IVF  02/07/15 1L NS  02/01/15 1L NS  01/30/15 1L NS   01/29/15 1L NS  01/28/15 (IVF recommended, but pt declined)  01/25/15 1L NS + 30 meq KCl  01/23/15 1L NS + 30 meq KCl  01/22/15 1L NS + 30 meq KCl (+ IV Zofran)  01/18/15 (last day of tx) 1L NS + 30 meq KCl  01/17/15 (during tx) 1L NS + 30 meq KCl  01/16/15 (during tx) 1L NS + 30 meq KCl   01/15/15 (during tx) 1L NS   01/14/15 (during tx) 1L NS   01/12/15 (during tx) 1L NS   01/10/15 (during tx) 1L NS   01/08/15 (during tx) 1L NS      PHYSICAL EXAM:  Filed Vitals:   02/07/15 1109 02/07/15 1110  BP: 132/63 132/70  Pulse: 66 76  *Orthostatic VS  Weight Date  124 lb 9.6 oz (56.518 kg) 02/07/15  125 lb 9.6 oz (56.972 kg) 02/04/15  127 lb 14.4 oz (58.015 kg) 01/28/15  130 lb 6.4 oz (59.149 kg) 01/21/15  132 lb 3.2 oz (59.966 kg) 01/14/15  136 lb 12.8 oz (62.052 kg) 01/07/15  142 lb 12.8 oz (64.774 kg) 12/24/14  154 lb 9.6 oz (70.126 kg) Pre-treatment: 10/31/14   General: Male in no acute distress.  Accompanied by his wife today.  HEENT: Head is atraumatic and normocephalic.  Pupils equal and reactive to light. Conjunctivae clear without exudate.  Sclerae anicteric. Bilateral tympanic membranes visualized on otoscopic exam; no redness or bulging noted. No redness or exudate in external auditory canal. Oral mucosa is pink and moist. Saliva is thick.  Tongue pink, moist  with mildly improved white,  thrush coating. Hard palate, directly posterior to teeth, with white coating and healing ulcers.  Oropharynx is red with patchy lesions.   (+) trismus.  Lymph: No cervical or supraclavicular lymphadenopathy noted on palpation.   Neck: No palpable masses. Skin on neck is hyperpigmented and dry. No open lesions.  Skin in radiation treatment fields is healing appropriately.   Cardiovascular: Normal rate and rhythm. Respiratory: Clear to auscultation bilaterally. Chest expansion symmetric without accessory muscle use on inspiration.  GI: Abdomen soft and round. No tenderness to palpation. Bowel sounds mildly hypoactive. GU: Deferred.   Neuro: No focal deficits. Steady gait.   Psych: Normal mood and affect for situation. Extremities: No edema, cyanosis, or clubbing.   Skin: Warm and dry.    LABORATORY DATA:   BMET    Component Value Date/Time   NA 139 02/07/2015 1023   NA 138 01/06/2015 1422   K 3.7 02/07/2015 1023   K 4.5 01/06/2015 1422   CL 100* 01/06/2015 1422   CO2 26 02/07/2015 1023   CO2 21* 01/06/2015 1422   GLUCOSE 143* 02/07/2015 1023   GLUCOSE 185* 01/06/2015 1422   BUN 13.2 02/07/2015 1023   BUN 27* 01/06/2015 1422   CREATININE 0.9 02/07/2015 1023   CREATININE 1.02 01/06/2015 1422   CREATININE 0.81 09/19/2010 1339   CALCIUM 10.1 02/07/2015 1023   CALCIUM 10.4* 01/06/2015 1422   GFRNONAA >60 01/06/2015 1422   GFRAA >60 01/06/2015 1422    I have reviewed all the lab results. There are some abnormalities that are not critical to the patient's health. I reviewed the labs today, in detail, with the patient and his wife.    DIAGNOSTIC IMAGING:  None at this visit.     ASSESSMENT & PLAN:  Kevin Mcclain is a pleasant 69 y.o. male with history of cancer of the right tonsil, treated with IMRT alone; completed treatment on 01/18/15.  Patient presents to survivorship clinic today for routine follow-up and to address acute survivorship  needs.   1. Cancer of the right tonsil: Kevin Mcclain is continuing to recover from definitive radiation therapy to his head & neck.  The patient will see Dr. Isidore Moos on 05/02/2015 for surveillance.  PET scan will be done and results reviewed with patient by Dr. Isidore Moos at that visit in 05/2015.    2. Decreased oral intake due to recent radiation therapy:  His last IV fluid infusion was 6 days ago.  BMP labs remain stable today.  He is not orthostatic.  We discussed the opportunity to receive IV fluids today, given he and his wife's recent reported concerns of decreased intake and continued pain.   He does endorse that he is unable to tolerate consistent high volumes of oral intake (both water and the Boost shakes).  His wife is worried about his decreased intake and his additional 1 lb weight loss.  I let Kevin Mcclain know that my preference would be to administer IV fluids today, with the plan of seeing him again on Monday for further evaluation.  Kevin Mcclain agreed to receive IV fluids today.  His potassium is normal and remains stable. We will not replete any electrolytes today.  We will administer 1L NS over 1.5 hours.  He will return to clinic on Monday for labs and evaluation with me in clinic.  We will decide if we need to administer IV fluids again and/or if we can potentially begin to space out his follow-up appointments, given that he remains stable and is progressing  towards healing. My hope is that his weight will stabilize, and he will be able to begin trying some soft foods soon and gradually start to increase his weight.    3. Oral candidiasis: This is slightly improved from previous exam on Monday.  Symptomatically, he has noticed quite a bit of improvement since starting the Nystatin oral swish. He still has not taken the last 2 doses of the previously prescribed course of Diflucan (previously ordered for 21 days by Dr. Isidore Moos).  I encouraged Kevin Mcclain to take the remaining 2 pills  over the next 2 days (1 pill/day) to complete the course.  He will continue to Nystatin and mouth rinses with water for oral hygiene after meals, as well.  We will continue to monitor.   4. Pain: He will need refills of both his Hydrocodone solution and Fentanyl patches on Monday.  I will refill those at his follow-up visit with me.    5. Nausea & vomiting: This has been minimal and intermittent.  Encouraged him to continue the Zofran, since it has provided relief thus far. Encouraged him to monitor his bowel movements, as Zofran can sometimes lead to constipation in some patients.  If his nausea & vomiting worsens, then we may need to investigate further.    A total of 20 minutes was spent with this patient, with greater than 50% of that time in counseling and care-coordination.    Kevin Craze, NP [place] [phone]

## 2015-02-11 ENCOUNTER — Ambulatory Visit (HOSPITAL_BASED_OUTPATIENT_CLINIC_OR_DEPARTMENT_OTHER): Payer: Medicare Other | Admitting: Adult Health

## 2015-02-11 ENCOUNTER — Encounter: Payer: Self-pay | Admitting: Adult Health

## 2015-02-11 ENCOUNTER — Other Ambulatory Visit (HOSPITAL_BASED_OUTPATIENT_CLINIC_OR_DEPARTMENT_OTHER): Payer: Medicare Other

## 2015-02-11 ENCOUNTER — Ambulatory Visit
Admission: RE | Admit: 2015-02-11 | Discharge: 2015-02-11 | Disposition: A | Discharge status: Home / Self Care | Payer: Medicare Other | Source: Ambulatory Visit | Attending: Radiation Oncology | Admitting: Radiation Oncology

## 2015-02-11 VITALS — BP 142/78 | HR 116 | Temp 97.7°F | Resp 16 | Wt 122.5 lb

## 2015-02-11 DIAGNOSIS — C099 Malignant neoplasm of tonsil, unspecified: Secondary | ICD-10-CM

## 2015-02-11 DIAGNOSIS — R070 Pain in throat: Secondary | ICD-10-CM | POA: Diagnosis not present

## 2015-02-11 DIAGNOSIS — K1233 Oral mucositis (ulcerative) due to radiation: Secondary | ICD-10-CM

## 2015-02-11 DIAGNOSIS — R112 Nausea with vomiting, unspecified: Secondary | ICD-10-CM

## 2015-02-11 DIAGNOSIS — E860 Dehydration: Secondary | ICD-10-CM

## 2015-02-11 DIAGNOSIS — R638 Other symptoms and signs concerning food and fluid intake: Secondary | ICD-10-CM

## 2015-02-11 DIAGNOSIS — R634 Abnormal weight loss: Secondary | ICD-10-CM

## 2015-02-11 LAB — COMPREHENSIVE METABOLIC PANEL
ALT: 42 U/L (ref 0–55)
ANION GAP: 12 meq/L — AB (ref 3–11)
AST: 32 U/L (ref 5–34)
Albumin: 3.9 g/dL (ref 3.5–5.0)
Alkaline Phosphatase: 83 U/L (ref 40–150)
BUN: 11.7 mg/dL (ref 7.0–26.0)
CALCIUM: 10 mg/dL (ref 8.4–10.4)
CHLORIDE: 100 meq/L (ref 98–109)
CO2: 26 mEq/L (ref 22–29)
Creatinine: 0.9 mg/dL (ref 0.7–1.3)
EGFR: 87 mL/min/{1.73_m2} — ABNORMAL LOW (ref >90)
Glucose: 208 mg/dl — ABNORMAL HIGH (ref 70–140)
POTASSIUM: 3.8 meq/L (ref 3.5–5.1)
Sodium: 138 mEq/L (ref 136–145)
Total Bilirubin: 1.09 mg/dL (ref 0.20–1.20)
Total Protein: 7 g/dL (ref 6.4–8.3)

## 2015-02-11 MED ORDER — SUCRALFATE 1 G PO TABS: ORAL_TABLET | ORAL | Status: DC

## 2015-02-11 MED ORDER — SUCRALFATE 1 GM/10ML PO SUSP: 1.0000 g | Freq: Three times a day (TID) | ORAL | Status: DC

## 2015-02-11 MED ORDER — FENTANYL 50 MCG/HR TD PT72: MEDICATED_PATCH | TRANSDERMAL | Status: DC

## 2015-02-11 MED ORDER — HYDROCODONE-ACETAMINOPHEN 7.5-325 MG/15ML PO SOLN: ORAL | Status: DC

## 2015-02-11 NOTE — Progress Notes (Signed)
CLINIC:  Survivorship  REASON FOR VISIT:  Routine follow-up for head & neck cancer and to address post-treatment acute survivorship needs.   BRIEF ONCOLOGIC HISTORY:  Oncology History   Tonsil cancer   Staging form: Pharynx - Oropharynx, AJCC 7th Edition     Clinical: Stage II (T2, N0, M0) - Signed by Eppie Gibson, MD on 10/31/2014       Tonsil cancer ([place])   12/04/2013 Miscellaneous TSH: 2.65; no h/o hypothyroidism.   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    11/06/2014 Imaging CT neck: Right palatine tonsil mass measuring 2.1 x 2.1 x 2.5 cm.  Asymmetric right LNs in level 1B, 2, and level 3.    12/03/2014 - 01/18/2015 Radiation Therapy Right tonsil and bilateral neck / 70 Gy in 35 fractions to gross disease, 63 Gy in 35 fractions to high risk nodal echelons, and 56 Gy in 35 fractions to intermediate risk nodal echelons   01/06/2015 Miscellaneous ER VISIT for dysphagia.  No hospital admission. CT neck done and pt was able to tolerate po liquids afer IV zofran. Discharged home.    01/06/2015 Imaging CT neck: Significant interval treatment response with no residual measurable right tonsil mass. Mild mucosal thickening and hyperenhancement in bilateral oropharynx & epiglottis, likely tx effect. No cervical adenpathy.     INTERVAL HISTORY:  Mr. Buzzetta presents today for follow-up visit.  He reports experiencing increased nausea with 1 episode of vomiting (this morning) since his last visit last Thursday (02/07/15).   He had drank 1 of his shakes this morning (Boost VHC, chocolate milk, and Boost Plus) and "threw it all up about 1 hour later."  He feels that the N&V is related to both swallowing the thick oral  secretions, as well as from "gagging/dry heaving" as a result of the thick secretions.  He was unable to take the Zofran in time this morning, but states that the Zofran does help relieve his nausea.  His throat pain is continued, but improving when compared to last week.  "I feel like I'm getting a little better every day; it is just slow."  He is continuing to use the Hydrocodone liquid, as well as the Fentanyl patch.  He is continuing to use the Nystatin swish for oral thrush and it is continuing to help his symptoms. He has lost an additional ~2 lbs since his last office visit last week.     Pain: Throat pain; 5-6/10 at rest; 8-9/10 when swallowing, but slowly improving.  Continuing to use Hydrocodone 30 ml every 4 hours "around the clock"; also has 50 mcg Fentanyl patch.    Nutritional Status:  -Intake/Diet: 10 oz of Boost "shakes" twice daily.  Continued nausea & vomiting; vomited today x 1  -Using a feeding tube? No -Weight change: Loss (~2 lbs) since 02/07/15  Swallowing:  Able to tolerate liquids. Pain continues to be main challenge to swallowing.  Last ENT visit: [id number] [facility] pt, he is doing very well. No new concerns.   Last Dentist visit:  12/18/14 ([facility])-continue oral hygiene with fluoride at bedtime; trismus exercises; RTC in 2 months for post-RT oral exam.  Summary of last Med Onc visit: 10/31/14-Concurrent chemo not necessary.   Summary of last Rad Onc visit:  01/21/15-pain,  weight loss, dehydration, dysphagia. Follow-up with Dr. Isidore Moos in 05/2015 with PET.   Last TSH value: 12/04/13-TSH 2.65  Last Imaging:  01/06/15-CT neck: Significant interval treatment response with no residual measurable right tonsil mass. Mild mucosal thickening and hyperenhancement in bilateral oropharynx & epiglottis, likely tx effect. No cervical adenpathy.    ADDITIONAL REVIEW OF SYSTEMS:  Review of Systems  Constitutional: Positive for weight loss.       -Continuing to lose weight  HENT: Positive  for sore throat.        -Sore throat pain improving, but still requires pain medication regularly every day.    Respiratory: Positive for sputum production.        -Thick, oropharyngeal secretions; no longer pink-tinged, sometimes yellow now  Gastrointestinal: Positive for nausea and vomiting. Negative for diarrhea and constipation.       -He reports waking up very nauseated today and had 1 episode of vomiting today. -Continues to have BM daily that is soft; not concerned about constipation.  Genitourinary: Negative for dysuria.     CURRENT MEDICATIONS:  Current Outpatient Prescriptions on File Prior to Visit  Medication Sig Dispense Refill  . allopurinol (ZYLOPRIM) 300 MG tablet TAKE 1 TABLET BY MOUTH EVERY DAY 90 tablet 1  . amLODipine (NORVASC) 10 MG tablet TAKE 1 TABLET BY MOUTH EVERY DAY 90 tablet 1  . aspirin EC 325 MG tablet Take 325 mg by mouth daily.    Marland Kitchen atorvastatin (LIPITOR) 20 MG tablet TAKE 1 TABLET BY MOUTH EVERY DAY 90 tablet 1  . colchicine 0.6 MG tablet Take 1 tablet (0.6 mg total) by mouth daily as needed. (Patient taking differently: Take 0.6 mg by mouth daily as needed (gout flare). ) 90 tablet 1  . desoximetasone (TOPICORT) 0.25 % cream APPLY TOPICALLY TWICE DAILY (Patient not taking: Reported on 12/24/2014) 30 g 5  . fluconazole (DIFLUCAN) 100 MG tablet Take 2 tablets today, then 1 tablet daily x 20 more days. Hold atorvastatin and colchicine while on this Rx 22 tablet 0  . lidocaine (XYLOCAINE) 2 % solution Mix 1 part 2%viscous lidocaine,1part H2O.Swish and/or swallow 46ml of this mixture,20min before meals and at bedtime, up to QID 100 mL 5  . metFORMIN (GLUCOPHAGE) 500 MG tablet TAKE 1 TABLET BY MOUTH 3 TIMES A DAY (Patient taking differently: TAKE 1 TABLET BY MOUTH WITH BREAKFAST AND TAKE 2 TABLETS BY MOUTH AT 9PM) 270 tablet 1  . metoprolol succinate (TOPROL-XL) 50 MG 24 hr tablet TAKE 1 TABLET BY MOUTH DAILY. TAKE WITH OR IMMEDIATELY FOLLOWING A MEAL (Patient  taking differently: TAKE 1 TABLET BY MOUTH DAILY WITH BREAKFAST) 90 tablet 1  . nitroGLYCERIN (NITROSTAT) 0.4 MG SL tablet Place 1 tablet (0.4 mg total) under the tongue every 5 (five) minutes as needed. 50 tablet 11  . nystatin (MYCOSTATIN) 100000 UNIT/ML suspension Take 5 mLs (500,000 Units total) by mouth 4 (four) times daily. 60 mL 0  . ondansetron (ZOFRAN-ODT) 8 MG disintegrating tablet Take 1 tablet (8 mg total) by mouth every 8 (eight) hours as needed for nausea or vomiting. 30 tablet 5  . OVER THE COUNTER MEDICATION Place 1 drop into both eyes 3 (three) times daily as needed (dry eyes/ irritation). Clear Cooling Eye Drops    . promethazine (PHENERGAN) 25 MG tablet Take 1 tablet (25 mg total) by mouth every 6 (six) hours as needed for nausea or vomiting. (Patient not taking: Reported on 01/06/2015) 60 tablet 2  . ramipril (ALTACE) 5 MG capsule TAKE 2  CAPSULES BY MOUTH EVERY DAY 180 capsule 1  . ranitidine (ZANTAC) 75 MG tablet Take 75 mg by mouth daily as needed for heartburn (acid reflux).     Marland Kitchen senna (SENOKOT) 8.6 MG TABS tablet Take 1 tablet (8.6 mg total) by mouth at bedtime as needed for mild constipation. 30 each 1  . sodium fluoride (FLUORISHIELD) 1.1 % GEL dental gel Instill one drop of gel per tooth space of fluoride tray. Place over teeth for 5 minutes. Remove. Spit out excess. Repeat nightly. 120 mL prn  . sulindac (CLINORIL) 200 MG tablet Take 0.5 tablets (100 mg total) by mouth daily as needed. (Patient not taking: Reported on 01/06/2015) 45 tablet 1   Current Facility-Administered Medications on File Prior to Visit  Medication Dose Route Frequency Provider Last Rate Last Dose  . 0.9 %  sodium chloride infusion   Intravenous Once Holley Bouche, NP      . 0.9 %  sodium chloride infusion   Intravenous Once Holley Bouche, NP        ALLERGIES:  Allergies  Allergen Reactions  . Benzonatate Swelling    "swells my throat"  . Codeine Itching  . Sitagliptin Phosphate Itching       IV FLUID ADMINISTRATION RECORD: Date Volume & IVF  02/07/15 1L NS  02/01/15 1L NS  01/30/15 1L NS   01/29/15 1L NS  01/28/15 (IVF recommended, but pt declined)  01/25/15 1L NS + 30 meq KCl  01/23/15 1L NS + 30 meq KCl  01/22/15 1L NS + 30 meq KCl (+ IV Zofran)  01/18/15 (last day of tx) 1L NS + 30 meq KCl  01/17/15 (during tx) 1L NS + 30 meq KCl  01/16/15 (during tx) 1L NS + 30 meq KCl   01/15/15 (during tx) 1L NS   01/14/15 (during tx) 1L NS   01/12/15 (during tx) 1L NS   01/10/15 (during tx) 1L NS   01/08/15 (during tx) 1L NS      PHYSICAL EXAM:  Filed Vitals:   02/11/15 1115  BP: 142/78  Pulse: 116  Temp: 97.7 F (36.5 C)  Resp: 16    Weight Date  122 lb 8 oz (55.566 kg) 02/11/15  124 lb 9.6 oz (56.518 kg) 02/07/15  125 lb 9.6 oz (56.972 kg) 02/04/15  127 lb 14.4 oz (58.015 kg) 01/28/15  130 lb 6.4 oz (59.149 kg) 01/21/15  132 lb 3.2 oz (59.966 kg) 01/14/15  136 lb 12.8 oz (62.052 kg) 01/07/15  142 lb 12.8 oz (64.774 kg) 12/24/14  154 lb 9.6 oz (70.126 kg) Pre-treatment: 10/31/14   General: Male in no acute distress.  Accompanied by his wife today.  HEENT: Head is atraumatic and normocephalic.  Pupils equal and reactive to light. Conjunctivae clear without exudate.  Sclerae anicteric. Oral mucosa is pink and moist. Saliva is thick.  Tongue pink, moist with continued white, thrush coating. Hard palate, directly posterior to teeth, with white coating.  Difficult to visualize posterior oropharynx given pt's inability to open mouth wide and strong gag reflex.   (+) trismus.  Lymph: No cervical or supraclavicular lymphadenopathy noted on palpation.   Neck: No palpable masses. Skin on neck is hyperpigmented and dry. No open lesions.  Skin in radiation treatment fields is healing appropriately.   Cardiovascular: Tachycardic, regular rhythm. Respiratory: Clear to auscultation bilaterally. Chest expansion symmetric without accessory muscle use on inspiration.  GI: Abdomen  soft and round. No tenderness to palpation. Bowel sounds normoactive x 4  quadrants. GU: Deferred.   Neuro: No focal deficits. Steady gait.   Psych: Normal mood and affect for situation. Extremities: No edema, cyanosis, or clubbing.   Skin: Warm and dry.    LABORATORY DATA:   BMET    Component Value Date/Time   NA 138 02/11/2015 1041   NA 138 01/06/2015 1422   K 3.8 02/11/2015 1041   K 4.5 01/06/2015 1422   CL 100* 01/06/2015 1422   CO2 26 02/11/2015 1041   CO2 21* 01/06/2015 1422   GLUCOSE 208* 02/11/2015 1041   GLUCOSE 185* 01/06/2015 1422   BUN 11.7 02/11/2015 1041   BUN 27* 01/06/2015 1422   CREATININE 0.9 02/11/2015 1041   CREATININE 1.02 01/06/2015 1422   CREATININE 0.81 09/19/2010 1339   CALCIUM 10.0 02/11/2015 1041   CALCIUM 10.4* 01/06/2015 1422   GFRNONAA >60 01/06/2015 1422   GFRAA >60 01/06/2015 1422    I have reviewed all the lab results. There are some abnormalities that are not critical to the patient's health. I reviewed the labs today, in detail, with the patient and his wife.    DIAGNOSTIC IMAGING:  None at this visit.     ASSESSMENT & PLAN:  Mr. Mchaffie is a pleasant 69 y.o. male with history of cancer of the right tonsil, treated with IMRT alone; completed treatment on 01/18/15.  Patient presents to survivorship clinic today for routine follow-up and to address acute survivorship needs.   1. Cancer of the right tonsil: Mr. Loehrer is continuing to recover from definitive radiation therapy to his head & neck.  The patient will see Dr. Isidore Moos on 05/02/2015 for surveillance.  PET scan will be done and results reviewed with patient by Dr. Isidore Moos at that visit in 05/2015.    2. Weight loss:  Mr. Linnemann has continued to lose some weight, which is concerning.  I discussed his situation with [name], RD our oncology dietitian. She recommends that Mr. Curro try to increase his Boost VHC (very high calorie) consumption to 3 cans per day. His  goal is 4 cans/day. Right now, he is only taking about 2 cans/day.  We are working to help stabilize his weight so that he can continue to heal.  His albumin remains normal at 3.9.  His other chemistry labs are grossly within normal limits as well.  We discussed the importance of both increased calorie and protein consumption to help with his continued healing.  His healing has likely been delayed given his poor intake and refusal of PEG placement.  I encouraged him to try to consume 5-6 small meals per day (with his shakes), as well as begin to introduce some bland, soft foods to his diet as well.  He will continue to work on increasing his fluid consumption also (although I think he is doing pretty well given the overall stability of his lab work).    3. Nausea with vomiting:  We discussed at length today the possible etiologies of his nausea and vomiting.  It is likely that when he is consuming larger volumes of the nutritional supplements, that his stomach is expanding too rapidly causing the nausea and vomiting.  I also think the swallowing of the thick secretions is contributing to his nausea.  Therefore, I encouraged Mr. Kettering to schedule his Zofran daily (i.e. Take the Zofran every morning for the next 3-4 days, whether he feels nauseas or not).  My hope is this will prevent any additional vomiting and relieve his nausea.  I  encouraged him to try to consume smaller, more frequent "meals" with both soft foods and his nutritional supplements, as he is able.   3. Oral candidiasis: It is unclear if this is continued thrush or if he has post-radiation changes to his oral mucosa.  I encouraged him to continue the Nystatin swish, as it offers him symptomatic relief and also encouraged continued good oral hygiene. He can gently brush his tongue with a soft-bristle toothbrush, which may be helpful as well.    4. Throat Pain/Mucositis related to radiation therapy: I refilled the hydrocodone liquid today  [Hydrocodone-acetaminophin 7.5-325/15 ml solution; Take 15-30 ml Q4Hprn, #102ml; no refills].  I also refilled the Fentanyl patches [Fentanyl 50 mcg patch Q3 days, #5; no refills].  I also e-prescribed Sucralfate 1 gm QID and prn for throat pain/mucositis.  I gave him instructions on how to take this medication. I am hopeful this may provide additional relief and help to both improve his healing, while allowing him to begin to consume more oral intake.     I will follow-up with the patient later this week to assess his progress and likely will bring him towards the end of the week for evaluation.  My hope is that his weight will stabilize and he can begin to consume more solid/soft foods, in addition to the nutritional supplementation.    I discussed his case today with Dr. Isidore Moos, who then briefly saw the patient in-person and agreed with the above plan of care.    A total of 35 minutes was spent with this patient, with greater than 50% of that time in counseling and care-coordination.    [name], NP [place] [phone]

## 2015-02-13 ENCOUNTER — Telehealth: Payer: Self-pay | Admitting: Adult Health

## 2015-02-13 NOTE — Telephone Encounter (Signed)
I spoke with Ms. Frew and she tells me that the patient has had a good day today.  She states that he had quite a bit of nausea & vomiting Monday, which continued some into Tuesday, but he is improved today.  I asked if they were taking the Zofran scheduled (as we discussed on Monday) and she stated that they were not.  I encouraged her to give Mr. Blankley the Zofran around the clock to help manage the nausea before it progresses to vomiting.  She tells me that he was able to eat some thin oatmeal and some beef broth today.  He is also having some of his Boost shakes.  Given that the nausea and vomiting is improving, I will plan on seeing Mr. Odeh on Monday for follow-up.  Mrs. Rhames agreed to this plan and stated that she would let the patient know.  I encouraged her to call if, at any time, if she needs me before Monday.  She voiced understanding and appreciation for my call.   [name], NP [place] [phone]

## 2015-02-15 ENCOUNTER — Telehealth: Payer: Self-pay | Admitting: Adult Health

## 2015-02-15 ENCOUNTER — Other Ambulatory Visit: Payer: Self-pay | Admitting: Adult Health

## 2015-02-15 DIAGNOSIS — C099 Malignant neoplasm of tonsil, unspecified: Secondary | ICD-10-CM

## 2015-02-15 DIAGNOSIS — B370 Candidal stomatitis: Secondary | ICD-10-CM

## 2015-02-15 MED ORDER — NYSTATIN 100000 UNIT/ML MT SUSP: 5.0000 mL | Freq: Four times a day (QID) | OROMUCOSAL | Status: DC

## 2015-02-15 NOTE — Telephone Encounter (Signed)
I received a call this morning from the patient's wife, [name], asking for a refill on the Nystatin suspension.  I have renewed this prescription with 5 refills and e-prescribed this to the [facility] as requested by the patient's wife.  Mrs. Strimple tells me that this medication seems to be helping the patient with his symptoms of mucositis and oral thrush. He is still struggling with some nausea & vomiting, but was able to eat some oatmeal today.  I encouraged her to call me if they have any other needs or questions.  I will see them on Monday in a follow-up visit.  The patient and his wife are aware of this appointment.   [name], NP [place] [phone]

## 2015-02-18 ENCOUNTER — Ambulatory Visit (HOSPITAL_BASED_OUTPATIENT_CLINIC_OR_DEPARTMENT_OTHER): Payer: Medicare Other | Admitting: Adult Health

## 2015-02-18 ENCOUNTER — Encounter: Payer: Self-pay | Admitting: Adult Health

## 2015-02-18 VITALS — BP 145/72 | HR 93 | Resp 14 | Wt 120.4 lb

## 2015-02-18 DIAGNOSIS — K1233 Oral mucositis (ulcerative) due to radiation: Secondary | ICD-10-CM | POA: Diagnosis not present

## 2015-02-18 DIAGNOSIS — R112 Nausea with vomiting, unspecified: Secondary | ICD-10-CM

## 2015-02-18 DIAGNOSIS — C099 Malignant neoplasm of tonsil, unspecified: Secondary | ICD-10-CM | POA: Diagnosis not present

## 2015-02-18 DIAGNOSIS — R070 Pain in throat: Secondary | ICD-10-CM

## 2015-02-18 DIAGNOSIS — R634 Abnormal weight loss: Secondary | ICD-10-CM

## 2015-02-18 MED ORDER — HYDROCODONE-ACETAMINOPHEN 7.5-325 MG/15ML PO SOLN: ORAL | Status: DC

## 2015-02-18 MED ORDER — ONDANSETRON 8 MG PO TBDP
8.0000 mg | ORAL_TABLET | Freq: Three times a day (TID) | ORAL | Status: DC | PRN
Start: 2015-02-18 — End: 2015-07-25

## 2015-02-18 NOTE — Progress Notes (Signed)
I refilled ODT Zofran for patient today as well.  Prescription e-prescribed to [facility].   Zofran ODT 8mg  Q8Hprn, #30, 5 refills.   [name], NP [place] [phone]

## 2015-02-18 NOTE — Progress Notes (Signed)
CLINIC:  Survivorship  REASON FOR VISIT:  Routine follow-up for head & neck cancer and to address post-treatment acute survivorship needs.   BRIEF ONCOLOGIC HISTORY:  Oncology History   Tonsil cancer   Staging form: Pharynx - Oropharynx, AJCC 7th Edition     Clinical: Stage II (T2, N0, M0) - Signed by Eppie Gibson, MD on 10/31/2014       Tonsil cancer ([place])   12/04/2013 Miscellaneous TSH: 2.65; no h/o hypothyroidism.   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    11/06/2014 Imaging CT neck: Right palatine tonsil mass measuring 2.1 x 2.1 x 2.5 cm.  Asymmetric right LNs in level 1B, 2, and level 3.    12/03/2014 - 01/18/2015 Radiation Therapy Right tonsil and bilateral neck / 70 Gy in 35 fractions to gross disease, 63 Gy in 35 fractions to high risk nodal echelons, and 56 Gy in 35 fractions to intermediate risk nodal echelons   01/06/2015 Miscellaneous ER VISIT for dysphagia.  No hospital admission. CT neck done and pt was able to tolerate po liquids afer IV zofran. Discharged home.    01/06/2015 Imaging CT neck: Significant interval treatment response with no residual measurable right tonsil mass. Mild mucosal thickening and hyperenhancement in bilateral oropharynx & epiglottis, likely tx effect. No cervical adenpathy.     INTERVAL HISTORY:  Mr. Courter presents today for follow-up visit.  He is beginning to try more and more soft foods (scrambled eggs, oatmeal, watered-down tomato soup, etc).  However, often when he tries "real food", his is immediately experiencing nausea and vomiting.  He has not tried taking the Zofran on a schedule around-the-clock as suggested last week.  He feels like he is drinking plenty of  water.  Having soft bowel movements daily and (+) flatus. He is still drinking his Boost shakes, as tolerated. Pain is "a little better" in his mouth and throat, but is experiencing burning; still requiring Hydrocodone liquid 4-5 times per day.      Pain: Continuing to use Hydrocodone 30 ml 4-5x/day; also has 50 mcg Fentanyl patch.    Nutritional Status:  -Intake/Diet: Some soft foods; some Boost shakes; still experiencing N&V.  -Using a feeding tube? No -Weight change: Loss (~2 lbs) since 02/11/15  Swallowing:  Able to tolerate liquids and some soft foods.   Last ENT visit: [id number] [facility] pt, he is doing very well. No new concerns.   Last Dentist visit:  12/18/14 ([facility])-continue oral hygiene with fluoride at bedtime; trismus exercises; RTC in 2 months for post-RT oral exam.  Summary of last Med Onc visit: 10/31/14-Concurrent chemo not necessary.   Summary of last Rad Onc visit:  01/21/15-pain, weight loss, dehydration, dysphagia. Follow-up with Dr. Isidore Moos in 05/2015 with PET.   Last TSH value: 12/04/13-TSH 2.65  Last Imaging:  01/06/15-CT neck: Significant interval treatment response with no residual measurable right tonsil mass. Mild mucosal thickening and hyperenhancement in bilateral oropharynx & epiglottis, likely tx effect. No cervical adenpathy.    ADDITIONAL REVIEW OF SYSTEMS:  Review of Systems  Constitutional: Positive for weight loss. Negative for fever.       -Continuing to lose weight  HENT: Positive for sore throat.        -Sore throat pain improving, but still  requires pain medication regularly every day.    Respiratory: Positive for sputum production.        -Thick, oropharyngeal secretions  Gastrointestinal: Positive for nausea and vomiting. Negative for abdominal pain, diarrhea and constipation.       -Is having nausea with vomiting about every other day; worse when trying to eat "real" food. Better with ODT Zofran (needs a refill of the Zofran today).  -Reports  having some gas and ?acid reflux; no abdominal pain.  -Continues to have BM daily that is soft; not concerned about constipation.     CURRENT MEDICATIONS:  Current Outpatient Prescriptions on File Prior to Visit  Medication Sig Dispense Refill  . allopurinol (ZYLOPRIM) 300 MG tablet TAKE 1 TABLET BY MOUTH EVERY DAY 90 tablet 1  . amLODipine (NORVASC) 10 MG tablet TAKE 1 TABLET BY MOUTH EVERY DAY 90 tablet 1  . aspirin EC 325 MG tablet Take 325 mg by mouth daily.    Marland Kitchen atorvastatin (LIPITOR) 20 MG tablet TAKE 1 TABLET BY MOUTH EVERY DAY 90 tablet 1  . colchicine 0.6 MG tablet Take 1 tablet (0.6 mg total) by mouth daily as needed. (Patient taking differently: Take 0.6 mg by mouth daily as needed (gout flare). ) 90 tablet 1  . desoximetasone (TOPICORT) 0.25 % cream APPLY TOPICALLY TWICE DAILY (Patient not taking: Reported on 12/24/2014) 30 g 5  . fentaNYL (DURAGESIC - DOSED MCG/HR) 50 MCG/HR Apply 1 patch q 3 days to skin. 5 patch 0  . fluconazole (DIFLUCAN) 100 MG tablet Take 2 tablets today, then 1 tablet daily x 20 more days. Hold atorvastatin and colchicine while on this Rx 22 tablet 0  . lidocaine (XYLOCAINE) 2 % solution Mix 1 part 2%viscous lidocaine,1part H2O.Swish and/or swallow 74ml of this mixture,66min before meals and at bedtime, up to QID 100 mL 5  . metFORMIN (GLUCOPHAGE) 500 MG tablet TAKE 1 TABLET BY MOUTH 3 TIMES A DAY (Patient taking differently: TAKE 1 TABLET BY MOUTH WITH BREAKFAST AND TAKE 2 TABLETS BY MOUTH AT 9PM) 270 tablet 1  . metoprolol succinate (TOPROL-XL) 50 MG 24 hr tablet TAKE 1 TABLET BY MOUTH DAILY. TAKE WITH OR IMMEDIATELY FOLLOWING A MEAL (Patient taking differently: TAKE 1 TABLET BY MOUTH DAILY WITH BREAKFAST) 90 tablet 1  . nitroGLYCERIN (NITROSTAT) 0.4 MG SL tablet Place 1 tablet (0.4 mg total) under the tongue every 5 (five) minutes as needed. 50 tablet 11  . nystatin (MYCOSTATIN) 100000 UNIT/ML suspension Take 5 mLs (500,000 Units total) by mouth 4 (four)  times daily. May use 4 times per day and as needed. 60 mL 5  . OVER THE COUNTER MEDICATION Place 1 drop into both eyes 3 (three) times daily as needed (dry eyes/ irritation). Clear Cooling Eye Drops    . promethazine (PHENERGAN) 25 MG tablet Take 1 tablet (25 mg total) by mouth every 6 (six) hours as needed for nausea or vomiting. (Patient not taking: Reported on 01/06/2015) 60 tablet 2  . ramipril (ALTACE) 5 MG capsule TAKE 2 CAPSULES BY MOUTH EVERY DAY 180 capsule 1  . ranitidine (ZANTAC) 75 MG tablet Take 75 mg by mouth daily as needed for heartburn (acid reflux).     Marland Kitchen senna (SENOKOT) 8.6 MG TABS tablet Take 1 tablet (8.6 mg total) by mouth at bedtime as needed for mild constipation. 30 each 1  . sodium fluoride (FLUORISHIELD) 1.1 % GEL dental gel Instill one drop of gel per tooth space of fluoride tray. Place over teeth for 5  minutes. Remove. Spit out excess. Repeat nightly. 120 mL prn  . sucralfate (CARAFATE) 1 G tablet Dissolve 1 tablet in 55mL H2O and swallow up to QID,PRN sore throat. 60 tablet 5  . sulindac (CLINORIL) 200 MG tablet Take 0.5 tablets (100 mg total) by mouth daily as needed. (Patient not taking: Reported on 01/06/2015) 45 tablet 1   Current Facility-Administered Medications on File Prior to Visit  Medication Dose Route Frequency Provider Last Rate Last Dose  . 0.9 %  sodium chloride infusion   Intravenous Once Holley Bouche, NP      . 0.9 %  sodium chloride infusion   Intravenous Once Holley Bouche, NP        ALLERGIES:  Allergies  Allergen Reactions  . Benzonatate Swelling    "swells my throat"  . Codeine Itching  . Sitagliptin Phosphate Itching     IV FLUID ADMINISTRATION RECORD: Date Volume & IVF  02/07/15 1L NS  02/01/15 1L NS  01/30/15 1L NS   01/29/15 1L NS  01/28/15 (IVF recommended, but pt declined)  01/25/15 1L NS + 30 meq KCl  01/23/15 1L NS + 30 meq KCl  01/22/15 1L NS + 30 meq KCl (+ IV Zofran)  01/18/15 (last day of tx) 1L NS + 30 meq KCl    01/17/15 (during tx) 1L NS + 30 meq KCl  01/16/15 (during tx) 1L NS + 30 meq KCl   01/15/15 (during tx) 1L NS   01/14/15 (during tx) 1L NS   01/12/15 (during tx) 1L NS   01/10/15 (during tx) 1L NS   01/08/15 (during tx) 1L NS      PHYSICAL EXAM:  Filed Vitals:   02/18/15 1216  BP: 145/72  Pulse: 93  Resp: 14    Weight Date  120 lb 6.4 oz (54.613 kg) 02/18/15  122 lb 8 oz (55.566 kg) 02/11/15  124 lb 9.6 oz (56.518 kg) 02/07/15  125 lb 9.6 oz (56.972 kg) 02/04/15  127 lb 14.4 oz (58.015 kg) 01/28/15  130 lb 6.4 oz (59.149 kg) 01/21/15  132 lb 3.2 oz (59.966 kg) 01/14/15  136 lb 12.8 oz (62.052 kg) 01/07/15  142 lb 12.8 oz (64.774 kg) 12/24/14  154 lb 9.6 oz (70.126 kg) Pre-treatment: 10/31/14   General: Male in no acute distress.  Accompanied by his wife today.  HEENT: Head is atraumatic and normocephalic.  Pupils equal and reactive to light. Conjunctivae clear without exudate.  Sclerae anicteric. Oral mucosa is pink; small lesion in left buccal mucosa that appears to be ?irritation from tooth (upcoming visit with Dentist). Saliva is thick.  Tongue pink,  with continued, diffuse white coating. Hard palate, directly posterior to teeth, with white coating.  Difficult to visualize posterior oropharynx given pt's inability to open mouth wide and strong gag reflex.   (+) trismus.  Lymph: No cervical or supraclavicular lymphadenopathy noted on palpation.   Neck: No palpable masses. Skin on neck is hyperpigmented and dry. No open lesions.  Skin in radiation treatment fields is healing appropriately.   Cardiovascular: Normal rate and regular rhythm. Respiratory: Clear to auscultation bilaterally. Chest expansion symmetric without accessory muscle use.  GI: Abdomen soft and round. No tenderness to palpation in epigastric region. Bowel sounds slightly hypoactive.  GU: Deferred.   Neuro: No focal deficits. Steady gait.   Psych: Normal mood and affect for situation. Extremities: No edema,  cyanosis, or clubbing.   Skin: Warm and dry.    LABORATORY DATA:  None for  today's visit.   DIAGNOSTIC IMAGING:  None at this visit.     ASSESSMENT & PLAN:  Mr. Quiroz is a pleasant 69 y.o. male with history of cancer of the right tonsil, treated with IMRT alone; completed treatment on 01/18/15.  Patient presents to survivorship clinic today for routine follow-up and to address acute survivorship needs.   1. Cancer of the right tonsil: Mr. Strub is continuing to recover from definitive radiation therapy to his head & neck.  The patient will see Dr. Isidore Moos on 05/02/2015 for surveillance.  PET scan will be done and results reviewed with patient by Dr. Isidore Moos at that visit in 05/2015.    2. Weight loss:  Mr. Cagle has lost a total of about 34 lbs since his cancer diagnosis.  We discussed the importance of both increased calorie and protein consumption to help with his continued healing. He is continuing to consume adequate fluids, which I commended him for those efforts.  He is beginning to experiment more with different soft foods, which I encouraged him to continue to do.  I stressed the importance of trying to consume 5-6 small meals per day, in lieu of trying to eat larger meals.  I stressed the importance and getting his weight loss stabilized, as his healing has likely been delayed given his decreased oral intake and inadequate calorie consumption.  He voiced that he will continue to make every effort with regards to his nutrition.   3. Nausea with vomiting:  He has not tried scheduling the Zofran daily as was discussed at our last visit.  I reiterated the importance of preventing nausea and vomiting before his symptoms arise.  He reports that he has been having a lot of gas and potential acid reflux lately as well, which is likely from irregular meals.  I encouraged him to try some OTC Mylanta and/or Zantac daily, along with the ODT Zofran, to help combat his GI upset with  meals.  He voiced understanding and agrees with this plan.   4. Throat Pain/Mucositis: I refilled the hydrocodone liquid today [Hydrocodone-acetaminophin 7.5-325/15 ml solution; Take 15-30 ml Q4Hprn, #1338ml; no refills; extra ml volume given to account for cancer center being closed during upcoming holiday].    5. Oral mucositis related to radiation therapy: He will continue the Nystatin oral solution, as this seems to offer him symptomatic relief.  I do not think he has thrush any longer, as he has completed a 21-day course of Diflucan, as well as a 1-week course of QID dosing with Nystatin swishes.  I encouraged him to continue with performing good oral hygiene by brushing his teeth and tongue with a soft bristle toothbrush.  He will be seeing Dr. Enrique Sack for his post-radiation dental evaluation tomorrow, 02/19/15.  I will route my note to Dr. Enrique Sack for his review prior to that visit.   I will follow-up with the patient mid next week after the holiday to see how he is progressing.  My hope is that if we can get his nausea/vomiting controlled, then he will continue to try to eat more often with smaller meals and his weight will stabilize.    A total of 20 minutes was spent with this patient, with greater than 50% of that time in counseling and care-coordination.    [name], NP [place] [phone]

## 2015-02-19 ENCOUNTER — Ambulatory Visit (HOSPITAL_COMMUNITY): Payer: Self-pay | Admitting: Dentistry

## 2015-02-19 ENCOUNTER — Telehealth (HOSPITAL_COMMUNITY): Payer: Self-pay

## 2015-02-19 NOTE — Telephone Encounter (Signed)
02/19/15        Wife, [name] called to cancel appt. for S/P XRT CK on 02/19/15 w/ Dr. Enrique Sack due to patient having nausea and vomiting.  Rescheduled appt. for 03/06/15 @ 9:45.  LRI

## 2015-02-26 ENCOUNTER — Ambulatory Visit (HOSPITAL_BASED_OUTPATIENT_CLINIC_OR_DEPARTMENT_OTHER): Payer: Medicare Other | Admitting: Adult Health

## 2015-02-26 ENCOUNTER — Telehealth: Payer: Self-pay | Admitting: Adult Health

## 2015-02-26 ENCOUNTER — Encounter: Payer: Self-pay | Admitting: Adult Health

## 2015-02-26 VITALS — BP 138/61 | HR 61 | Temp 97.9°F | Resp 16 | Wt 121.5 lb

## 2015-02-26 DIAGNOSIS — C099 Malignant neoplasm of tonsil, unspecified: Secondary | ICD-10-CM | POA: Diagnosis not present

## 2015-02-26 DIAGNOSIS — R070 Pain in throat: Secondary | ICD-10-CM

## 2015-02-26 DIAGNOSIS — R252 Cramp and spasm: Secondary | ICD-10-CM | POA: Diagnosis not present

## 2015-02-26 DIAGNOSIS — K1230 Oral mucositis (ulcerative), unspecified: Secondary | ICD-10-CM | POA: Diagnosis not present

## 2015-02-26 DIAGNOSIS — E46 Unspecified protein-calorie malnutrition: Secondary | ICD-10-CM | POA: Diagnosis not present

## 2015-02-26 MED ORDER — FENTANYL 50 MCG/HR TD PT72: MEDICATED_PATCH | TRANSDERMAL | Status: DC

## 2015-02-26 MED ORDER — HYDROCODONE-ACETAMINOPHEN 7.5-325 MG/15ML PO SOLN: ORAL | Status: DC

## 2015-02-26 NOTE — Telephone Encounter (Signed)
Received call from Kevin Mcclain wife stating that her husband would like to come in today to be seen.  I have scheduled them to come in at 3pm today.    [name], NP [place] [phone]

## 2015-02-26 NOTE — Progress Notes (Signed)
CLINIC:  Survivorship  REASON FOR VISIT:  Routine follow-up for head & neck cancer and to address post-treatment acute survivorship needs.   BRIEF ONCOLOGIC HISTORY:  Oncology History   Tonsil cancer   Staging form: Pharynx - Oropharynx, AJCC 7th Edition     Clinical: Stage II (T2, N0, M0) - Signed by Eppie Gibson, MD on 10/31/2014       Tonsil cancer ([place])   12/04/2013 Miscellaneous TSH: 2.65; no h/o hypothyroidism.   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    11/06/2014 Imaging CT neck: Right palatine tonsil mass measuring 2.1 x 2.1 x 2.5 cm.  Asymmetric right LNs in level 1B, 2, and level 3.    12/03/2014 - 01/18/2015 Radiation Therapy Right tonsil and bilateral neck / 70 Gy in 35 fractions to gross disease, 63 Gy in 35 fractions to high risk nodal echelons, and 56 Gy in 35 fractions to intermediate risk nodal echelons   01/06/2015 Miscellaneous ER VISIT for dysphagia.  No hospital admission. CT neck done and pt was able to tolerate po liquids afer IV zofran. Discharged home.    01/06/2015 Imaging CT neck: Significant interval treatment response with no residual measurable right tonsil mass. Mild mucosal thickening and hyperenhancement in bilateral oropharynx & epiglottis, likely tx effect. No cervical adenpathy.     INTERVAL HISTORY:  Mr. Kosko presents today for follow-up visit.  He has gained a little weight today, which is a huge accomplishment for him.  He has been able tolerate a variety of different soft foods; still taking Boost supplements as well. Nausea has been well-managed with him taking the Zofran scheduled (once or twice daily).  Continues to have burning throat pain and takes the Hydrocodone  before all meals.  Cancelled his recent scheduled dentist appointment with Dr. Enrique Sack due to nausea; is rescheduled to see him in early Jan. 2017 for post-RT oral exam.    Pain: Continuing to use Hydrocodone 30 ml 4-5x/day; also has 50 mcg Fentanyl patch.    Nutritional Status:  -Intake/Diet: Tolerating more soft foods without difficulty.   -Using a feeding tube? No -Weight change: GAIN (~1.5 lbs) since 02/18/15  Swallowing:  Able to tolerate liquids and some soft foods.   Last ENT visit: [id number] [facility] pt, he is doing very well. No new concerns.   Last Dentist visit:  12/18/14 ([facility])-continue oral hygiene with fluoride at bedtime; trismus exercises; RTC in 2 months for post-RT oral exam.  Summary of last Med Onc visit: 10/31/14-Concurrent chemo not necessary.   Summary of last Rad Onc visit:  01/21/15-pain, weight loss, dehydration, dysphagia. Follow-up with Dr. Isidore Moos in 05/2015 with PET.   Last TSH value: 12/04/13-TSH 2.65  Last Imaging:  01/06/15-CT neck: Significant interval treatment response with no residual measurable right tonsil mass. Mild mucosal thickening and hyperenhancement in bilateral oropharynx & epiglottis, likely tx effect. No cervical adenpathy.    ADDITIONAL REVIEW OF SYSTEMS:  Review of Systems  Constitutional:       -Has gained weight today!  HENT: Positive for sore throat.        -Sore throat pain improving, but still requires pain medication regularly every day.    Respiratory: Positive for sputum production.        -Thick, oropharyngeal  secretions improving. "I don't have to stop talking and spit all the time anymore."   Gastrointestinal: Positive for nausea and vomiting. Negative for abdominal pain, diarrhea and constipation.       -N&V improved with scheduled Zofran.  -Continues to have BM daily     CURRENT MEDICATIONS:  Current Outpatient Prescriptions on File Prior to Visit  Medication Sig Dispense Refill  . allopurinol (ZYLOPRIM) 300 MG tablet  TAKE 1 TABLET BY MOUTH EVERY DAY 90 tablet 1  . amLODipine (NORVASC) 10 MG tablet TAKE 1 TABLET BY MOUTH EVERY DAY 90 tablet 1  . aspirin EC 325 MG tablet Take 325 mg by mouth daily.    Marland Kitchen atorvastatin (LIPITOR) 20 MG tablet TAKE 1 TABLET BY MOUTH EVERY DAY 90 tablet 1  . colchicine 0.6 MG tablet Take 1 tablet (0.6 mg total) by mouth daily as needed. (Patient taking differently: Take 0.6 mg by mouth daily as needed (gout flare). ) 90 tablet 1  . desoximetasone (TOPICORT) 0.25 % cream APPLY TOPICALLY TWICE DAILY (Patient not taking: Reported on 12/24/2014) 30 g 5  . fluconazole (DIFLUCAN) 100 MG tablet Take 2 tablets today, then 1 tablet daily x 20 more days. Hold atorvastatin and colchicine while on this Rx 22 tablet 0  . lidocaine (XYLOCAINE) 2 % solution Mix 1 part 2%viscous lidocaine,1part H2O.Swish and/or swallow 89ml of this mixture,49min before meals and at bedtime, up to QID 100 mL 5  . metFORMIN (GLUCOPHAGE) 500 MG tablet TAKE 1 TABLET BY MOUTH 3 TIMES A DAY (Patient taking differently: TAKE 1 TABLET BY MOUTH WITH BREAKFAST AND TAKE 2 TABLETS BY MOUTH AT 9PM) 270 tablet 1  . metoprolol succinate (TOPROL-XL) 50 MG 24 hr tablet TAKE 1 TABLET BY MOUTH DAILY. TAKE WITH OR IMMEDIATELY FOLLOWING A MEAL (Patient taking differently: TAKE 1 TABLET BY MOUTH DAILY WITH BREAKFAST) 90 tablet 1  . nitroGLYCERIN (NITROSTAT) 0.4 MG SL tablet Place 1 tablet (0.4 mg total) under the tongue every 5 (five) minutes as needed. 50 tablet 11  . nystatin (MYCOSTATIN) 100000 UNIT/ML suspension Take 5 mLs (500,000 Units total) by mouth 4 (four) times daily. May use 4 times per day and as needed. 60 mL 5  . ondansetron (ZOFRAN-ODT) 8 MG disintegrating tablet Take 1 tablet (8 mg total) by mouth every 8 (eight) hours as needed for nausea or vomiting. 30 tablet 5  . OVER THE COUNTER MEDICATION Place 1 drop into both eyes 3 (three) times daily as needed (dry eyes/ irritation). Clear Cooling Eye Drops    . promethazine  (PHENERGAN) 25 MG tablet Take 1 tablet (25 mg total) by mouth every 6 (six) hours as needed for nausea or vomiting. (Patient not taking: Reported on 01/06/2015) 60 tablet 2  . ramipril (ALTACE) 5 MG capsule TAKE 2 CAPSULES BY MOUTH EVERY DAY 180 capsule 1  . ranitidine (ZANTAC) 75 MG tablet Take 75 mg by mouth daily as needed for heartburn (acid reflux).     Marland Kitchen senna (SENOKOT) 8.6 MG TABS tablet Take 1 tablet (8.6 mg total) by mouth at bedtime as needed for mild constipation. 30 each 1  . sodium fluoride (FLUORISHIELD) 1.1 % GEL dental gel Instill one drop of gel per tooth space of fluoride tray. Place over teeth for 5 minutes. Remove. Spit out excess. Repeat nightly. 120 mL prn  . sucralfate (CARAFATE) 1 G tablet Dissolve 1 tablet in 17mL H2O and swallow up to QID,PRN sore throat. 60 tablet 5  . sulindac (CLINORIL) 200 MG tablet  Take 0.5 tablets (100 mg total) by mouth daily as needed. (Patient not taking: Reported on 01/06/2015) 45 tablet 1   Current Facility-Administered Medications on File Prior to Visit  Medication Dose Route Frequency Provider Last Rate Last Dose  . 0.9 %  sodium chloride infusion   Intravenous Once Holley Bouche, NP      . 0.9 %  sodium chloride infusion   Intravenous Once Holley Bouche, NP        ALLERGIES:  Allergies  Allergen Reactions  . Benzonatate Swelling    "swells my throat"  . Codeine Itching  . Sitagliptin Phosphate Itching     IV FLUID ADMINISTRATION RECORD: Date Volume & IVF  02/07/15 1L NS  02/01/15 1L NS  01/30/15 1L NS   01/29/15 1L NS  01/28/15 (IVF recommended, but pt declined)  01/25/15 1L NS + 30 meq KCl  01/23/15 1L NS + 30 meq KCl  01/22/15 1L NS + 30 meq KCl (+ IV Zofran)  01/18/15 (last day of tx) 1L NS + 30 meq KCl  01/17/15 (during tx) 1L NS + 30 meq KCl  01/16/15 (during tx) 1L NS + 30 meq KCl   01/15/15 (during tx) 1L NS   01/14/15 (during tx) 1L NS   01/12/15 (during tx) 1L NS   01/10/15 (during tx) 1L NS   01/08/15  (during tx) 1L NS      PHYSICAL EXAM:  Filed Vitals:   02/26/15 1500  BP: 138/61  Pulse: 61  Temp: 97.9 F (36.6 C)  Resp: 16    Weight Date  121 lb 8 oz (55.112 kg) 02/26/15  120 lb 6.4 oz (54.613 kg) 02/18/15  122 lb 8 oz (55.566 kg) 02/11/15  124 lb 9.6 oz (56.518 kg) 02/07/15  125 lb 9.6 oz (56.972 kg) 02/04/15  127 lb 14.4 oz (58.015 kg) 01/28/15  130 lb 6.4 oz (59.149 kg) 01/21/15  132 lb 3.2 oz (59.966 kg) 01/14/15  136 lb 12.8 oz (62.052 kg) 01/07/15  142 lb 12.8 oz (64.774 kg) 12/24/14  154 lb 9.6 oz (70.126 kg) Pre-treatment: 10/31/14   General: Male in no acute distress.  Accompanied by his wife today.  HEENT: Head is atraumatic and normocephalic.  Pupils equal and reactive to light. Conjunctivae clear without exudate.  Sclerae anicteric. Oral mucosa is pink and slightly dry. Saliva is thick.  Tongue pink with white coating (improved from previous). Hard palate, directly posterior to teeth, with white coating (again, improved from previous).  Posterior oropharynx with mild erythema. Difficult to visualize posterior oropharynx completely given pt's inability to open mouth wide. (+) trismus, but improved from previous. Lymph: No cervical or supraclavicular lymphadenopathy noted on palpation.   Neck: No palpable masses. Skin on neck is hyperpigmented and dry. No open lesions.  Skin in radiation treatment fields is healed. Cardiovascular: Normal rate and regular rhythm. Respiratory: Clear to auscultation bilaterally. Chest expansion symmetric without accessory muscle use.  GI: Abdomen soft and round. No tenderness to palpation. Bowel sounds normoactive.  GU: Deferred.   Neuro: No focal deficits. Steady gait.   Psych: Normal mood and affect for situation. Extremities: No edema, cyanosis, or clubbing.   Skin: Warm and dry.    LABORATORY DATA:  None for today's visit.   DIAGNOSTIC IMAGING:  None at this visit.     ASSESSMENT & PLAN:  [name] is a pleasant 69  y.o. male with history of cancer of the right tonsil, treated with IMRT alone; completed treatment  on 01/18/15.  Patient presents to survivorship clinic today for routine follow-up and to address acute survivorship needs.   1. Cancer of the right tonsil: Mr. Mousel is continuing to recover from definitive radiation therapy to his head & neck.  The patient will see Dr. Isidore Moos on 05/02/2015 for surveillance.  PET scan will be done and results reviewed with patient by Dr. Isidore Moos at that visit in 05/2015.    2. Protein-calorie malnutrition (improving):  Mr. Bartolotti has gained about ~1.5 lbs since his last visit and is better tolerating his oral intake.  I am encouraged by his progress and commended he and his wife on their efforts.  He is continuing to consume adequate fluids for hydration. He is continuing to experiment with different foods and is tolerating them better than previous.  I encouraged him to continue taking the Zofran ODT as needed for nausea, as he continues to increase his calorie intake.  He is taking 1-2 Zofran tabs per day.  I think this is fine in the short-term, and I am hopeful the need for antiemetics will decrease over the next few weeks as he expands his dietary intake.    3. Throat Pain/Mucositis: He continues to have oral mucositis related to treatment.  I refilled the hydrocodone liquid today [Hydrocodone-acetaminophin 7.5-325/15 ml solution; Take 15-30 ml Q4Hprn, #3566ml; no refills].   I also refilled the Fentanyl patch today as well. [Fentanyl patch 50 mcg Q3days, #10; no refills].   4. Oral mucositis related to radiation therapy: He will continue the Nystatin oral solution, as this seems to offer him symptomatic relief.   I encouraged him to continue with performing good oral hygiene by brushing his teeth and tongue with a soft bristle toothbrush.  He will be seeing Dr. Enrique Sack for his post-radiation dental evaluation in early January 2017.   5. Trismus: I stressed the  importance of Mr. Kendal working on the exercises given to him by dentistry for his jaw pain.  He has been unable to do the exercises in recent weeks d/t severe pain and mucositis.  He is healed enough now where I stressed the importance again of performing these exercises as instructed, in order to prevent long-term jaw complications.    I will follow-up with the patient for 2 more visits.  I will plan to see him once in January and once in February before his follow-up visit and PET scan with Dr. Isidore Moos in March 2017.  We will begin working to decrease his opiates in January and we discussed that plan today.  I will contact them for these appointments.  Mr. Bordes is agreeable to that plan.     A total of 20 minutes was spent with this patient, with greater than 50% of that time in counseling and care-coordination.    [name], NP [place] [phone]

## 2015-03-03 DIAGNOSIS — K319 Disease of stomach and duodenum, unspecified: Secondary | ICD-10-CM

## 2015-03-03 HISTORY — DX: Disease of stomach and duodenum, unspecified: K31.9

## 2015-03-05 ENCOUNTER — Telehealth (HOSPITAL_COMMUNITY): Payer: Self-pay

## 2015-03-05 NOTE — Telephone Encounter (Signed)
03/05/15                 Patient's wife [name] called and left msg. cancelling patient's S/P XRT [facility] appt. sch. for 03/06/15 w/Dr. Enrique Sack due to illness.  Patient was unable to keep last appt. w/Dr. Enrique Sack due to illness as well.  Will wait to hear from patient to resch.  LRI

## 2015-03-06 ENCOUNTER — Ambulatory Visit (HOSPITAL_COMMUNITY): Payer: Self-pay | Admitting: Dentistry

## 2015-03-08 ENCOUNTER — Telehealth: Payer: Self-pay | Admitting: Nutrition

## 2015-03-08 NOTE — Telephone Encounter (Signed)
Patient's wife returned phone call reporting patient has not felt good this week but was feeling better today.   He drank a boost, ate an egg and some french fries. No other concerns voiced.

## 2015-03-08 NOTE — Telephone Encounter (Signed)
Contacted patient by phone for nutrition follow-up. Patient was unavailable but I left a message with name and phone number for return call.

## 2015-03-12 ENCOUNTER — Telehealth: Payer: Self-pay | Admitting: Adult Health

## 2015-03-12 ENCOUNTER — Other Ambulatory Visit: Payer: Self-pay | Admitting: Adult Health

## 2015-03-12 DIAGNOSIS — R5383 Other fatigue: Secondary | ICD-10-CM

## 2015-03-12 DIAGNOSIS — R638 Other symptoms and signs concerning food and fluid intake: Secondary | ICD-10-CM

## 2015-03-12 DIAGNOSIS — R109 Unspecified abdominal pain: Secondary | ICD-10-CM

## 2015-03-12 DIAGNOSIS — C099 Malignant neoplasm of tonsil, unspecified: Secondary | ICD-10-CM

## 2015-03-12 DIAGNOSIS — R112 Nausea with vomiting, unspecified: Secondary | ICD-10-CM

## 2015-03-12 DIAGNOSIS — E860 Dehydration: Secondary | ICD-10-CM

## 2015-03-12 NOTE — Telephone Encounter (Signed)
I received a call from patient's wife stating that [name] is "getting weaker and weaker by the day."  He has been vomiting nearly daily for the past 10 or so days.  He is eating and drinking very little. (She reports that he only had about 8 oz yesterday of Boost yesterday). They do not have a scale at home, but his wife estimates that he has lost at least an additional 5 lbs since his last visit at the cancer center.  He is moving his bowels, but became so constipated from the Zofran that he stopped taking it.  [name] told his wife, "I just feel so bad that I am ready to go into the hospital so they can figure out what is wrong with me."  He has reported "upper and lower belly pain" to his wife.   I offered to bring them in today for evaluation, but they were unsure if they could get out of their driveway since the recent snow.  She stated that they would be able to come in tomorrow to see me.  Therefore, I have scheduled pt for labs and visit with me tomorrow.  Pending his evaluation, he may need outpatient IVF vs. hospital admission at that time.  I encouraged his wife to take [name] to the ER for evaluation if his condition worsens any further before he is seen here at the cancer center tomorrow. She voiced understanding and agreed with the above plan.   [name], NP [place] [phone]

## 2015-03-13 ENCOUNTER — Ambulatory Visit (HOSPITAL_BASED_OUTPATIENT_CLINIC_OR_DEPARTMENT_OTHER): Payer: Medicare Other | Admitting: Adult Health

## 2015-03-13 ENCOUNTER — Ambulatory Visit
Admission: RE | Admit: 2015-03-13 | Discharge: 2015-03-13 | Disposition: A | Discharge status: Home / Self Care | Payer: Medicare Other | Source: Ambulatory Visit | Attending: Radiation Oncology | Admitting: Radiation Oncology

## 2015-03-13 ENCOUNTER — Other Ambulatory Visit (HOSPITAL_BASED_OUTPATIENT_CLINIC_OR_DEPARTMENT_OTHER): Payer: Medicare Other

## 2015-03-13 ENCOUNTER — Encounter: Payer: Self-pay | Admitting: Adult Health

## 2015-03-13 VITALS — BP 134/61 | HR 72 | Temp 97.5°F | Resp 16 | Wt 119.6 lb

## 2015-03-13 DIAGNOSIS — R070 Pain in throat: Secondary | ICD-10-CM

## 2015-03-13 DIAGNOSIS — R109 Unspecified abdominal pain: Secondary | ICD-10-CM

## 2015-03-13 DIAGNOSIS — R112 Nausea with vomiting, unspecified: Secondary | ICD-10-CM

## 2015-03-13 DIAGNOSIS — C099 Malignant neoplasm of tonsil, unspecified: Secondary | ICD-10-CM

## 2015-03-13 DIAGNOSIS — K219 Gastro-esophageal reflux disease without esophagitis: Secondary | ICD-10-CM

## 2015-03-13 DIAGNOSIS — R5383 Other fatigue: Secondary | ICD-10-CM

## 2015-03-13 DIAGNOSIS — R638 Other symptoms and signs concerning food and fluid intake: Secondary | ICD-10-CM

## 2015-03-13 DIAGNOSIS — K1233 Oral mucositis (ulcerative) due to radiation: Secondary | ICD-10-CM

## 2015-03-13 DIAGNOSIS — E860 Dehydration: Secondary | ICD-10-CM

## 2015-03-13 DIAGNOSIS — R252 Cramp and spasm: Secondary | ICD-10-CM

## 2015-03-13 DIAGNOSIS — E46 Unspecified protein-calorie malnutrition: Secondary | ICD-10-CM

## 2015-03-13 LAB — COMPREHENSIVE METABOLIC PANEL
ALBUMIN: 3.9 g/dL (ref 3.5–5.0)
ALK PHOS: 52 U/L (ref 40–150)
ALT: 22 U/L (ref 0–55)
AST: 20 U/L (ref 5–34)
Anion Gap: 8 mEq/L (ref 3–11)
BILIRUBIN TOTAL: 0.79 mg/dL (ref 0.20–1.20)
BUN: 9.6 mg/dL (ref 7.0–26.0)
CHLORIDE: 101 meq/L (ref 98–109)
CO2: 30 mEq/L — ABNORMAL HIGH (ref 22–29)
CREATININE: 0.8 mg/dL (ref 0.7–1.3)
Calcium: 9.6 mg/dL (ref 8.4–10.4)
EGFR: 88 mL/min/{1.73_m2} — ABNORMAL LOW (ref >90)
GLUCOSE: 122 mg/dL (ref 70–140)
POTASSIUM: 4.2 meq/L (ref 3.5–5.1)
SODIUM: 139 meq/L (ref 136–145)
Total Protein: 6.4 g/dL (ref 6.4–8.3)

## 2015-03-13 LAB — CBC WITH DIFFERENTIAL/PLATELET
BASO%: 0.9 % (ref 0.0–2.0)
BASOS ABS: 0.1 10*3/uL (ref 0.0–0.1)
EOS%: 3.2 % (ref 0.0–7.0)
Eosinophils Absolute: 0.2 10*3/uL (ref 0.0–0.5)
HEMATOCRIT: 39.5 % (ref 38.4–49.9)
HGB: 13.1 g/dL (ref 13.0–17.1)
LYMPH%: 6.3 % — AB (ref 14.0–49.0)
MCH: 30.2 pg (ref 27.2–33.4)
MCHC: 33.3 g/dL (ref 32.0–36.0)
MCV: 90.9 fL (ref 79.3–98.0)
MONO#: 0.4 10*3/uL (ref 0.1–0.9)
MONO%: 7.2 % (ref 0.0–14.0)
NEUT#: 4.7 10*3/uL (ref 1.5–6.5)
NEUT%: 82.4 % — AB (ref 39.0–75.0)
PLATELETS: 201 10*3/uL (ref 140–400)
RBC: 4.34 10*6/uL (ref 4.20–5.82)
RDW: 13.8 % (ref 11.0–14.6)
WBC: 5.8 10*3/uL (ref 4.0–10.3)
lymph#: 0.4 10*3/uL — ABNORMAL LOW (ref 0.9–3.3)

## 2015-03-13 LAB — TSH: TSH: 1.878 m[IU]/L (ref 0.320–4.118)

## 2015-03-13 LAB — MAGNESIUM: Magnesium: 1.9 mg/dl (ref 1.5–2.5)

## 2015-03-13 MED ORDER — PROCHLORPERAZINE MALEATE 10 MG PO TABS: 10.0000 mg | ORAL_TABLET | Freq: Four times a day (QID) | ORAL | Status: DC | PRN

## 2015-03-13 MED ORDER — PANTOPRAZOLE SODIUM 40 MG PO TBEC: 40.0000 mg | DELAYED_RELEASE_TABLET | Freq: Every day | ORAL | Status: DC

## 2015-03-13 MED ORDER — FENTANYL 50 MCG/HR TD PT72: MEDICATED_PATCH | TRANSDERMAL | Status: DC

## 2015-03-13 MED ORDER — HYDROCODONE-ACETAMINOPHEN 7.5-325 MG/15ML PO SOLN: ORAL | Status: DC

## 2015-03-13 MED FILL — PANTOPRAZOLE SOD DR 40 MG T: 40 | 30 days supply | Qty: 30 | Fill #0

## 2015-03-13 MED FILL — PROCHLORPERAZINE 10 MG TAB: 10 | 22 days supply | Qty: 90 | Fill #0

## 2015-03-13 NOTE — Progress Notes (Signed)
CLINIC:  Survivorship  REASON FOR VISIT:  Routine follow-up for head & neck cancer and to address post-treatment acute survivorship needs.   BRIEF ONCOLOGIC HISTORY:  Oncology History   Tonsil cancer   Staging form: Pharynx - Oropharynx, AJCC 7th Edition     Clinical: Stage II (T2, N0, M0) - Signed by Eppie Gibson, MD on 10/31/2014       Tonsil cancer ([place])   12/04/2013 Miscellaneous TSH: 2.65; no h/o hypothyroidism.   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    11/06/2014 Imaging CT neck: Right palatine tonsil mass measuring 2.1 x 2.1 x 2.5 cm.  Asymmetric right LNs in level 1B, 2, and level 3.    12/03/2014 - 01/18/2015 Radiation Therapy Right tonsil and bilateral neck / 70 Gy in 35 fractions to gross disease, 63 Gy in 35 fractions to high risk nodal echelons, and 56 Gy in 35 fractions to intermediate risk nodal echelons   01/06/2015 Miscellaneous ER VISIT for dysphagia.  No hospital admission. CT neck done and pt was able to tolerate po liquids afer IV zofran. Discharged home.    01/06/2015 Imaging CT neck: Significant interval treatment response with no residual measurable right tonsil mass. Mild mucosal thickening and hyperenhancement in bilateral oropharynx & epiglottis, likely tx effect. No cervical adenpathy.     INTERVAL HISTORY:  Mr. Zawada presents today for follow-up visit after a concerned phone call from his wife received yesterday.  Patient reports more recent nausea with vomiting that has worsened in the past 10 days or so.  He was taking the Zofran around-the-clock, but this was stopped as he became constipated.  Therefore, he is currently not taking anything for nausea.  He has since been having  BMs daily or every other day.  LBM 03/12/15. He endorses periodic "upset stomach" and abdominal pain and is concerned that he may have an ulcer.  He describes the abdominal pain as "burning" at times.  He states that he has "tasted some acid in the back of my throat."  Denies coffee-ground/frank blood in emesis or stool.  "Some days are better than others" with regards to his intake. Yesterday he reports being able to drink 3 Ensure shakes. Other days he is experiencing nausea, "dry heaves" and is not able to eat much at all.  He reports still being able to drink water and fluids.  Denies any choking sensation with swallowing, but does report that he has to chew his food a lot before swallowing.  Endorses pain with swallowing, "because it is still so raw in my throat, but that is slowly getting better." Today, he is most concerned about the abdominal pain, nausea, and vomiting.     Pain: Continuing to use Hydrocodone 30 ml every 4 hours; also has 50 mcg Fentanyl patch.    Nutritional Status:  -Intake/Diet: Soft, regular foods supplemented with Ensure shakes -Using a feeding tube? No -Weight change: LOSS ~2 lbs since 02/26/15  Swallowing:  Able to tolerate liquids without difficulty and some soft foods.  Some throat pain with swallowing; denies   Last ENT visit: [id number] [facility] pt, he is doing very well. No new concerns.   Last Dentist visit:  12/18/14 ([facility])-continue oral hygiene with fluoride at bedtime; trismus exercises; RTC  in 2 months for post-RT oral exam.  Summary of last Med Onc visit: 10/31/14-Concurrent chemo not necessary.   Summary of last Rad Onc visit:  01/21/15-pain, weight loss, dehydration, dysphagia. Follow-up with Dr. Isidore Moos in 05/2015 with PET.   Last TSH value: 01/28/15-TSH 0.660  Last Imaging:  01/06/15-CT neck: Significant interval treatment response with no residual measurable right tonsil mass. Mild mucosal thickening and hyperenhancement in bilateral oropharynx &  epiglottis, likely tx effect. No cervical adenpathy.    ADDITIONAL REVIEW OF SYSTEMS:  Review of Systems  Constitutional: Negative for fever and weight loss.  HENT: Positive for sore throat.        -Sore throat pain improving, but still requires pain medication Q4H   Respiratory: Positive for sputum production.        -Oropharyngeal secretions are improving, but still present   Gastrointestinal: Positive for nausea and vomiting. Negative for abdominal pain, diarrhea and constipation.  Genitourinary: Negative for dysuria.  Neurological: Negative for dizziness.     CURRENT MEDICATIONS:  Current Outpatient Prescriptions on File Prior to Visit  Medication Sig Dispense Refill  . allopurinol (ZYLOPRIM) 300 MG tablet TAKE 1 TABLET BY MOUTH EVERY DAY 90 tablet 1  . amLODipine (NORVASC) 10 MG tablet TAKE 1 TABLET BY MOUTH EVERY DAY 90 tablet 1  . aspirin EC 325 MG tablet Take 325 mg by mouth daily.    Marland Kitchen atorvastatin (LIPITOR) 20 MG tablet TAKE 1 TABLET BY MOUTH EVERY DAY 90 tablet 1  . colchicine 0.6 MG tablet Take 1 tablet (0.6 mg total) by mouth daily as needed. (Patient taking differently: Take 0.6 mg by mouth daily as needed (gout flare). ) 90 tablet 1  . desoximetasone (TOPICORT) 0.25 % cream APPLY TOPICALLY TWICE DAILY (Patient not taking: Reported on 12/24/2014) 30 g 5  . fentaNYL (DURAGESIC - DOSED MCG/HR) 50 MCG/HR Apply 1 patch q 3 days to skin. 10 patch 0  . fluconazole (DIFLUCAN) 100 MG tablet Take 2 tablets today, then 1 tablet daily x 20 more days. Hold atorvastatin and colchicine while on this Rx 22 tablet 0  . HYDROcodone-acetaminophen (HYCET) 7.5-325 mg/15 ml solution Take 15-30mL every 4 hrs PRN pain. Do not exceed 12mL over 24 hours. 3500 mL 0  . lidocaine (XYLOCAINE) 2 % solution Mix 1 part 2%viscous lidocaine,1part H2O.Swish and/or swallow 26ml of this mixture,52min before meals and at bedtime, up to QID 100 mL 5  . metFORMIN (GLUCOPHAGE) 500 MG tablet TAKE 1 TABLET BY  MOUTH 3 TIMES A DAY (Patient taking differently: TAKE 1 TABLET BY MOUTH WITH BREAKFAST AND TAKE 2 TABLETS BY MOUTH AT 9PM) 270 tablet 1  . metoprolol succinate (TOPROL-XL) 50 MG 24 hr tablet TAKE 1 TABLET BY MOUTH DAILY. TAKE WITH OR IMMEDIATELY FOLLOWING A MEAL (Patient taking differently: TAKE 1 TABLET BY MOUTH DAILY WITH BREAKFAST) 90 tablet 1  . nitroGLYCERIN (NITROSTAT) 0.4 MG SL tablet Place 1 tablet (0.4 mg total) under the tongue every 5 (five) minutes as needed. 50 tablet 11  . nystatin (MYCOSTATIN) 100000 UNIT/ML suspension Take 5 mLs (500,000 Units total) by mouth 4 (four) times daily. May use 4 times per day and as needed. 60 mL 5  . ondansetron (ZOFRAN-ODT) 8 MG disintegrating tablet Take 1 tablet (8 mg total) by mouth every 8 (eight) hours as needed for nausea or vomiting. 30 tablet 5  . OVER THE COUNTER MEDICATION Place 1 drop into both eyes 3 (three) times daily as needed (dry eyes/ irritation). Clear Cooling Eye Drops    .  promethazine (PHENERGAN) 25 MG tablet Take 1 tablet (25 mg total) by mouth every 6 (six) hours as needed for nausea or vomiting. (Patient not taking: Reported on 01/06/2015) 60 tablet 2  . ramipril (ALTACE) 5 MG capsule TAKE 2 CAPSULES BY MOUTH EVERY DAY 180 capsule 1  . ranitidine (ZANTAC) 75 MG tablet Take 75 mg by mouth daily as needed for heartburn (acid reflux).     Marland Kitchen senna (SENOKOT) 8.6 MG TABS tablet Take 1 tablet (8.6 mg total) by mouth at bedtime as needed for mild constipation. 30 each 1  . sodium fluoride (FLUORISHIELD) 1.1 % GEL dental gel Instill one drop of gel per tooth space of fluoride tray. Place over teeth for 5 minutes. Remove. Spit out excess. Repeat nightly. 120 mL prn  . sucralfate (CARAFATE) 1 G tablet Dissolve 1 tablet in 34mL H2O and swallow up to QID,PRN sore throat. 60 tablet 5  . sulindac (CLINORIL) 200 MG tablet Take 0.5 tablets (100 mg total) by mouth daily as needed. (Patient not taking: Reported on 01/06/2015) 45 tablet 1   Current  Facility-Administered Medications on File Prior to Visit  Medication Dose Route Frequency Provider Last Rate Last Dose  . 0.9 %  sodium chloride infusion   Intravenous Once Holley Bouche, NP      . 0.9 %  sodium chloride infusion   Intravenous Once Holley Bouche, NP        ALLERGIES:  Allergies  Allergen Reactions  . Benzonatate Swelling    "swells my throat"  . Codeine Itching  . Sitagliptin Phosphate Itching     IV FLUID ADMINISTRATION RECORD: Date Volume & IVF  02/07/15 1L NS  02/01/15 1L NS  01/30/15 1L NS   01/29/15 1L NS  01/28/15 (IVF recommended, but pt declined)  01/25/15 1L NS + 30 meq KCl  01/23/15 1L NS + 30 meq KCl  01/22/15 1L NS + 30 meq KCl (+ IV Zofran)  01/18/15 (last day of tx) 1L NS + 30 meq KCl  01/17/15 (during tx) 1L NS + 30 meq KCl  01/16/15 (during tx) 1L NS + 30 meq KCl   01/15/15 (during tx) 1L NS   01/14/15 (during tx) 1L NS   01/12/15 (during tx) 1L NS   01/10/15 (during tx) 1L NS   01/08/15 (during tx) 1L NS      PHYSICAL EXAM:  Filed Vitals:   03/13/15 1130 03/13/15 1135  BP: 101/73 134/61  Pulse: 70 72  Temp: 97.5 F (36.4 C)   Resp: 16   *Orthostatic VS as above.   Weight Date  119 lb 9.6 oz (54.25 kg) 03/13/15  121 lb 8 oz (55.112 kg) 02/26/15  120 lb 6.4 oz (54.613 kg) 02/18/15  122 lb 8 oz (55.566 kg) 02/11/15  124 lb 9.6 oz (56.518 kg) 02/07/15  125 lb 9.6 oz (56.972 kg) 02/04/15  127 lb 14.4 oz (58.015 kg) 01/28/15  130 lb 6.4 oz (59.149 kg) 01/21/15  132 lb 3.2 oz (59.966 kg) 01/14/15  136 lb 12.8 oz (62.052 kg) 01/07/15  142 lb 12.8 oz (64.774 kg) 12/24/14  154 lb 9.6 oz (70.126 kg) Pre-treatment: 10/31/14   General: Male in no acute distress.  Accompanied by his wife today.  HEENT: Head is atraumatic and normocephalic.  Pupils equal and reactive to light. Conjunctivae clear without exudate.  Sclerae anicteric. Oral mucosa is pink and slightly dry. Saliva is thick.  Tongue pink with very mild white coating (much  improved from  previous).  Posterior oropharynx with very mild erythema and very small patch of mucositis with 1 small associated ulcer on posterior soft palate (much improved from previous).  Difficult to visualize posterior oropharynx completely given pt's inability to open mouth wide. (+) trismus, but slightly improved from previous. Lymph: No cervical or supraclavicular lymphadenopathy noted on palpation.   Neck: No palpable masses. Mild fibrosis noted to anterior neck. Skin on neck is hyperpigmented and dry. No open lesions.  Skin in radiation treatment fields is healed. Cardiovascular: Normal rate and regular rhythm. Respiratory: Clear to auscultation bilaterally. Chest expansion symmetric without accessory muscle use.  GI: Abdomen soft and round. Non-distending. No tenderness to palpation. Bowel sounds normoactive. No hepatosplenomegaly.  No rebound tenderness.  GU: Deferred.   Neuro: No focal deficits. Steady gait.   Psych: Normal mood and affect for situation. Extremities: No edema, cyanosis, or clubbing.   Skin: Warm and dry.    LABORATORY DATA:  CBC    Component Value Date/Time   WBC 5.8 03/13/2015 1120   WBC 10.3 01/06/2015 1422   RBC 4.34 03/13/2015 1120   RBC 4.90 01/06/2015 1422   HGB 13.1 03/13/2015 1120   HGB 15.2 01/06/2015 1422   HCT 39.5 03/13/2015 1120   HCT 43.3 01/06/2015 1422   PLT 201 03/13/2015 1120   PLT 267 01/06/2015 1422   MCV 90.9 03/13/2015 1120   MCV 88.4 01/06/2015 1422   MCH 30.2 03/13/2015 1120   MCH 31.0 01/06/2015 1422   MCHC 33.3 03/13/2015 1120   MCHC 35.1 01/06/2015 1422   RDW 13.8 03/13/2015 1120   RDW 12.9 01/06/2015 1422   LYMPHSABS 0.4* 03/13/2015 1120   LYMPHSABS 0.5* 01/06/2015 1422   MONOABS 0.4 03/13/2015 1120   MONOABS 0.3 01/06/2015 1422   EOSABS 0.2 03/13/2015 1120   EOSABS 0.0 01/06/2015 1422   BASOSABS 0.1 03/13/2015 1120   BASOSABS 0.0 01/06/2015 1422   CMP Latest Ref Rng 03/13/2015 02/11/2015 02/07/2015  Glucose 70 -  140 mg/dl 122 208(H) 143(H)  BUN 7.0 - 26.0 mg/dL 9.6 11.7 13.2  Creatinine 0.7 - 1.3 mg/dL 0.8 0.9 0.9  Sodium 136 - 145 mEq/L 139 138 139  Potassium 3.5 - 5.1 mEq/L 4.2 3.8 3.7  Chloride 101 - 111 mmol/L - - -  CO2 22 - 29 mEq/L 30(H) 26 26  Calcium 8.4 - 10.4 mg/dL 9.6 10.0 10.1  Total Protein 6.4 - 8.3 g/dL 6.4 7.0 7.1  Total Bilirubin 0.20 - 1.20 mg/dL 0.79 1.09 1.03  Alkaline Phos 40 - 150 U/L 52 83 91  AST 5 - 34 U/L 20 32 35(H)  ALT 0 - 55 U/L 22 42 50    Ref. Range 03/13/2015 11:20  Phosphorus Latest Ref Range: 2.5-4.5 mg/dL 3.3  Magnesium Latest Ref Range: 1.5-2.5 mg/dl 1.9  Amylase, Serum Latest Ref Range: 31-124 U/L 38  Lipase Latest Ref Range: 0-59 U/L 30  TSH Latest Ref Range: 0.320-4.118 m(IU)/L 1.878  PREALBUMIN Latest Ref Range: 9-32 mg/dL 24    *Labs resulted at time of visit were reviewed by this provider and with the patient.  I note some minor lab abnormalities that have been stable over time, these are of doubtful clinical significance.     DIAGNOSTIC IMAGING:  None at this visit.     ASSESSMENT & PLAN:  Mr. Minge is a pleasant 70 y.o. male with history of cancer of the right tonsil, treated with IMRT alone; completed treatment on 01/18/15.  Patient presents to survivorship clinic today for  routine follow-up and to address acute survivorship needs.   1. Cancer of the right tonsil: Mr. Zaldivar is continuing to recover from definitive radiation therapy to his head & neck.  The patient will see Dr. Isidore Moos on 05/02/2015 for surveillance.  PET scan will be done and results reviewed with patient by Dr. Isidore Moos at that visit in 05/2015.    2. Protein-calorie malnutrition:  Mr. Strosnider is slowly continuing to improve his solid food oral intake.  He is supplementing with Boost shakes as well. The biggest struggle to his ability to increase/progress his diet is the nausea & vomiting.  He has lost about 2 lbs since 02/26/15, which is a significantly slower  rate of weight loss he was experiencing previously.  Both his serum albumin and pre-albumin are within normal limits and remain stable.  BUN/creatinine normal and stable.  He is not orthostatic and clinically does not appear to be volume-depleted. My hope is that if we can get his N&V better controlled, then his oral intake will continue to improve as well.  I encouraged him to continue   3. Abdominal pain/GERD: The patient's reported symptoms today are likely most consistent with GERD. Amylase & lipase are normal ruling out acute pancreatitis in light of his reported abdominal pain with nausea & vomiting. Patient's reported history, his physical exam and laboratory studies are not worrisome for bowel obstruction or acute abdomen.   He is not constipated, although he is high-risk while taking opiates.  I explained the pathology of acid reflux and management strategies.  I have e-prescribed Protonix 40 mg po once daily (#30), 5 refills.  His magnesium is normal at 1.9, but we may need to consider supplementation in the future with the use of a PPI, which may lead to hypomagnesemia.  My hope is that placing him on a PPI will help control his symptoms of GERD, while treating any potential underlying gastritis that may be present.  I have let him know that if this medication, in combination with his nausea management with Compazine is unsuccessful, that he will likely need a referral to GI for continued evaluation and management.   4. Nausea & vomiting: Mr. Craig continues to experience nausea with vomiting.  He stopped taking the Zofran due to complaints of constipation.  It was previously effective.  The etiology of his N&V is likely multifactorial given his continued healing from radiation therapy.   He still has thick oral secretions that are swallowed, coupled with what appears to be GERD, gastritis is likely the etiology of his nausea & vomiting.  Therefore, I have switched him from Zofran to Compazine  10 mg po Q6Hprn and gave him instructions on how to take this medication.  If the 10 mg makes him sedated, then he can split the pill and take 5 mg instead.  He know that if he has any coffee-ground or bloody emesis that he should report that immediately, as peptic ulcer disease remains a potential differential diagnosis to consider.     5. Throat Pain: He continues to have oral mucositis related to treatment.  I refilled the hydrocodone liquid today [Hydrocodone-acetaminophin 7.5-325/15 ml solution; Take 15-30 ml Q4Hprn, #3565ml; no refills].   I also refilled the Fentanyl patch today as well. [Fentanyl patch 50 mcg Q3days, #5; no refills].  My goal was to begin to wean his pain medication beginning this month, however given his current complaints/concerns, we will begin those efforts more aggressively at our next visit in mid-February.  The patient is aware that we will work on weaning down on opiates at next visit.   6. 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  He continues to use the Nystatin periodically for comfort.   I encouraged him to continue with performing good oral hygiene by brushing his teeth and tongue with a soft bristle toothbrush.  He will be seeing Dr. Enrique Sack for his post-radiation dental evaluation in the near future. (previous appt cancelled by pt due to nausea and vomiting).   7. Trismus (mildly improved): I stressed the importance of Mr. Zukoski working on the exercises given to him by dentistry for his jaw pain.  He states that he "doesn't do the exercises as often as I should", so continued to empower him to work more diligently on this.  Preventing long-term jaw and dental complications is of great importance and Mr. Tait is aware.  He will be following up with Dr. Enrique Sack soon (no appt scheduled yet; previous appt cancelled by pt d/t N&V).   Dispo:  I will follow-up with the patient in  mid-February.  At that visit, we will work to begin to wean down on his opiates.  He will have his follow-up visit and PET scan with Dr. Isidore Moos in March 2017.   I encouraged the patient and his wife to call me if they had any questions or concerns before our next visit and I would be happy to see them early. They voiced understanding and are agreeable to this plan.    A total of 30 minutes was spent with this patient, with greater than 50% of that time in counseling and care-coordination.    [name], NP [place] [phone]

## 2015-03-14 LAB — PHOSPHORUS: PHOSPHORUS: 3.3 mg/dL (ref 2.5–4.5)

## 2015-03-14 LAB — LIPASE: Lipase: 30 U/L (ref 0–59)

## 2015-03-14 LAB — AMYLASE: AMYLASE: 38 U/L (ref 31–124)

## 2015-03-14 LAB — PREALBUMIN: PREALBUMIN: 24 mg/dL (ref 9–32)

## 2015-03-14 MED FILL — NYSTATIN 100,000 UNITS/ML S: 100000 | 3 days supply | Qty: 60 | Fill #1

## 2015-03-15 MED FILL — HYDROCOD-APAP 7.5-325/15ML: 7.5-325 | 19 days supply | Qty: 3500 | Fill #0

## 2015-03-20 ENCOUNTER — Telehealth: Payer: Self-pay | Admitting: *Deleted

## 2015-03-20 ENCOUNTER — Telehealth: Payer: Self-pay | Admitting: Adult Health

## 2015-03-20 ENCOUNTER — Other Ambulatory Visit: Payer: Self-pay | Admitting: Adult Health

## 2015-03-20 DIAGNOSIS — R112 Nausea with vomiting, unspecified: Secondary | ICD-10-CM

## 2015-03-20 DIAGNOSIS — C099 Malignant neoplasm of tonsil, unspecified: Secondary | ICD-10-CM

## 2015-03-20 MED ORDER — LORAZEPAM 0.5 MG PO TABS: 0.5000 mg | ORAL_TABLET | Freq: Four times a day (QID) | ORAL | Status: DC | PRN

## 2015-03-20 MED FILL — LORazepam 0.5 MG TABS: 0.5 | 23 days supply | Qty: 90 | Fill #0

## 2015-03-20 NOTE — Telephone Encounter (Signed)
CALLED PATIENT TO INFORM OF APPT. WITH A PA @ [facility]. ON 04-02-15 - ARRIVAL TIME - 8:30 AM, SPOKE WITH PATIENT'S WIFE- PATRICIA AND SHE IS AWARE OF THIS APPT.

## 2015-03-20 NOTE — Telephone Encounter (Signed)
I spoke with [name] to check on the patient to see how he had been feeling in recent days since starting the Protonix and Compazine for nausea & vomiting.  Unfortunately, Ms. Cart tells me that he is still vomiting, "not every day, but a lot of days."  She tells me that he is not always able to even keep his Boost shakes down.  She is concerned that he has some anxiety associated with eating and his subsequent N&V after/during meals.    I let [name] know that I am going to place a referral to [facility] for their next available appt.  [name] tells me that Kevin Mcclain does not want to see a GI specialist until he is able to see his PCP this Friday.  He is adamant about this.  I let her know that it would be ideal to get him in with GI as soon as possible (i.e. their next available appt/ideally this week), but she stated that he would refuse.  Therefore, I have asked that Kevin Mcclain be seen by GI sometime next week, based on the patient's wishes.    I prescribed Ativan 0.5 mg po Q6Hprn(#90, no refills) for N&V or anxiety.  [name] will come by the cancer center sometime today to pick up this prescription.  I let [name] know that Ativan can be very helpful for cancer patients in managing N&V.  It also sounds like he is experiencing some anxiety related to meals and the Ativan may help mitigate those symptoms as well.  I will also ask [name], RD to reach out to the patient/his wife to see if there may be diet modifications that may help alleviate some of his N&V (perhaps he is having some food intolerances?).  I will defer to our dietitian and GI specialists for their expertise on this.    We will continue to monitor Kevin Mcclain and await recommendations/treatment from GI/nutrition.    [name], NP [place] [phone]

## 2015-03-21 ENCOUNTER — Telehealth: Payer: Self-pay | Admitting: Adult Health

## 2015-03-21 NOTE — Telephone Encounter (Signed)
Mr. Goods wife called to let me know that since the patient started taking the Ativan, [name] has been able to "keep all of his food down today and has been feeling great. He has even been cutting up/joking around today."   I voiced appreciation for her call and am so glad that Mr. Sculthorpe is beginning to feel better without nausea & vomiting.  I encouraged them to keep their already scheduled PCP and GI appts, so that we may have him formally evaluated.  I will see them back at the cancer center in mid-February for routine follow-up.  I encouraged her to call me if his condition changes and voiced appreciation for her calling to let me know that he is feeling better.   [name], NP [place] [phone]

## 2015-03-22 ENCOUNTER — Encounter: Payer: Self-pay | Admitting: Internal Medicine

## 2015-03-22 ENCOUNTER — Ambulatory Visit (INDEPENDENT_AMBULATORY_CARE_PROVIDER_SITE_OTHER): Payer: Medicare Other | Admitting: Internal Medicine

## 2015-03-22 VITALS — BP 140/80 | HR 94 | Temp 98.3°F | Wt 116.0 lb

## 2015-03-22 DIAGNOSIS — I2510 Atherosclerotic heart disease of native coronary artery without angina pectoris: Secondary | ICD-10-CM

## 2015-03-22 DIAGNOSIS — R634 Abnormal weight loss: Secondary | ICD-10-CM | POA: Diagnosis not present

## 2015-03-22 DIAGNOSIS — I10 Essential (primary) hypertension: Secondary | ICD-10-CM | POA: Diagnosis not present

## 2015-03-22 DIAGNOSIS — R112 Nausea with vomiting, unspecified: Secondary | ICD-10-CM | POA: Diagnosis not present

## 2015-03-22 MED ORDER — METOPROLOL SUCCINATE ER 25 MG PO TB24: 25.0000 mg | ORAL_TABLET | Freq: Every day | ORAL | Status: DC

## 2015-03-22 MED ORDER — ONDANSETRON HCL 4 MG PO TABS: 2.0000 mg | ORAL_TABLET | Freq: Three times a day (TID) | ORAL | Status: DC | PRN

## 2015-03-22 MED ORDER — LUBIPROSTONE 24 MCG PO CAPS: 24.0000 ug | ORAL_CAPSULE | Freq: Two times a day (BID) | ORAL | Status: DC | PRN

## 2015-03-22 NOTE — Progress Notes (Signed)
Subjective:    Patient ID: Kevin Mcclain, male    DOB: 1945-05-24, 70 y.o.   MRN: [id number]  HPI  70 year old white male with history of type 2 diabetes, coronary artery disease and hypertension for follow-up. At last visit he was sent for further evaluation for right tonsillar mass. 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 He underwent multiple radiation treatments.  He has been experiencing significant nausea and weight loss since starting radiation therapy. He has lost 30-40 pounds.  He describes some upper neck discomfort with swallowing.  He is not in significant pain.  He is currently using fentanyl patch 50 mcg.  He has been treated for presumed thrush in Nov / Dec 2016.  He notes whitish film on tongue when he gets up in the morning.    He stopped most of his usual medications.  He noticed higher pulse with discontinuing metoprolol.  He is accompanied by supportive wife.  Review of Systems Weight loss,  Severe constipation when he use zofran 8 mg tid.  Normal saliva secretion.    Past Medical History  Diagnosis Date  . Depression   . Arthritis   . Diabetes mellitus type II   . Kidney stones   . CAD (coronary artery disease)     severe CAD-s/p MI 2000 with PTCA, s/p CABG 2005-by Dr Vanetta Mulders at [facility] EF 50-55%  . Hypertension   . Hyperlipidemia   . Gout   . Vertigo   . Cancer ([employer]) 10/12/2014    right tonsil=squamous cell  carcinaoma  . Allergy   . Anxiety     anxious about radiation  . GERD (gastroesophageal reflux disease)     zantac- prn    Social History   Social History  . Marital Status: Married    Spouse Name: N/A  . Number of Children: N/A  . Years of Education: N/A   Occupational History  . Not on file.   Social History Main Topics  . Smoking status: Never Smoker   . Smokeless tobacco: Never Used  . Alcohol Use: No  . Drug Use: No  . Sexual Activity: Not on file   Other Topics Concern  . Not on file   Social  History Narrative   Occupation :  Retired Arboriculturist for [employer].     Married   Tobacco use - no   Alcohol use-no         Physician roster:   Cardiologist-Dr. Stanford Breed   Gastroenterologist-Dr. Hilarie Fredrickson    Past Surgical History  Procedure Laterality Date  . Colonoscopy    . Coronary artery bypass graft  2005    6 grafts  . Cholecystectomy  2012  . Colonoscopy w/ polypectomy  2015  . Cystoscopy      kidney stone removal  . Lithotripsy    . Nasal sinus surgery Right 11/14/2014    Procedure: RIGHT ENDOSCOPIC ANTROSTOMY AND ANTERIOR ETHMOIDECTOMY ;  Surgeon: Jodi Marble, MD;  Location: [facility];  Service: ENT;  Laterality: Right;  . Multiple extractions with alveoloplasty N/A 11/14/2014    Procedure: Extraction of tooth #2 and 31 with alveoloplasty after sectioning of bridge at distal #29 with full mouth debridement of remaining dention;  Surgeon: Lenn Cal, DDS;  Location: [place];  Service: Oral Surgery;  Laterality: N/A;    Family History  Problem Relation Age of Onset  . Heart attack Mother     deceased age 56 secondary  . Heart  disease Mother   . Colon cancer Father   . Stroke Father     deceased age 44  . Heart disease Father   . Cancer Father     colon  . Stroke Sister 48  . Heart disease Sister   . Coronary artery disease Brother     s/p bypass surgery  . Heart disease Brother   . Heart disease Brother     Allergies  Allergen Reactions  . Benzonatate Swelling    "swells my throat"  . Codeine Itching  . Sitagliptin Phosphate Itching    Current Outpatient Prescriptions on File Prior to Visit  Medication Sig Dispense Refill  . allopurinol (ZYLOPRIM) 300 MG tablet TAKE 1 TABLET BY MOUTH EVERY DAY 90 tablet 1  . amLODipine (NORVASC) 10 MG tablet TAKE 1 TABLET BY MOUTH EVERY DAY 90 tablet 1  . aspirin EC 325 MG tablet Take 325 mg by mouth daily.    Marland Kitchen atorvastatin (LIPITOR) 20 MG tablet TAKE 1 TABLET BY MOUTH EVERY DAY 90 tablet 1  .  colchicine 0.6 MG tablet Take 1 tablet (0.6 mg total) by mouth daily as needed. (Patient taking differently: Take 0.6 mg by mouth daily as needed (gout flare). ) 90 tablet 1  . desoximetasone (TOPICORT) 0.25 % cream APPLY TOPICALLY TWICE DAILY 30 g 5  . fentaNYL (DURAGESIC - DOSED MCG/HR) 50 MCG/HR Apply 1 patch q 3 days to skin. 5 patch 0  . fluconazole (DIFLUCAN) 100 MG tablet Take 2 tablets today, then 1 tablet daily x 20 more days. Hold atorvastatin and colchicine while on this Rx 22 tablet 0  . HYDROcodone-acetaminophen (HYCET) 7.5-325 mg/15 ml solution Take 15-58mL every 4 hrs PRN pain. Do not exceed 182mL over 24 hours. 3500 mL 0  . lidocaine (XYLOCAINE) 2 % solution Mix 1 part 2%viscous lidocaine,1part H2O.Swish and/or swallow 7ml of this mixture,5min before meals and at bedtime, up to QID 100 mL 5  . LORazepam (ATIVAN) 0.5 MG tablet Take 1 tablet (0.5 mg total) by mouth every 6 (six) hours as needed (for nausea and vomiting or anxiety.). 90 tablet 0  . metFORMIN (GLUCOPHAGE) 500 MG tablet TAKE 1 TABLET BY MOUTH 3 TIMES A DAY (Patient taking differently: TAKE 1 TABLET BY MOUTH WITH BREAKFAST AND TAKE 2 TABLETS BY MOUTH AT 9PM) 270 tablet 1  . metoprolol succinate (TOPROL-XL) 50 MG 24 hr tablet TAKE 1 TABLET BY MOUTH DAILY. TAKE WITH OR IMMEDIATELY FOLLOWING A MEAL (Patient taking differently: TAKE 1 TABLET BY MOUTH DAILY WITH BREAKFAST) 90 tablet 1  . nitroGLYCERIN (NITROSTAT) 0.4 MG SL tablet Place 1 tablet (0.4 mg total) under the tongue every 5 (five) minutes as needed. 50 tablet 11  . nystatin (MYCOSTATIN) 100000 UNIT/ML suspension Take 5 mLs (500,000 Units total) by mouth 4 (four) times daily. May use 4 times per day and as needed. 60 mL 5  . ondansetron (ZOFRAN-ODT) 8 MG disintegrating tablet Take 1 tablet (8 mg total) by mouth every 8 (eight) hours as needed for nausea or vomiting. 30 tablet 5  . OVER THE COUNTER MEDICATION Place 1 drop into both eyes 3 (three) times daily as needed (dry  eyes/ irritation). Clear Cooling Eye Drops    . pantoprazole (PROTONIX) 40 MG tablet Take 1 tablet (40 mg total) by mouth daily. 30 tablet 5  . prochlorperazine (COMPAZINE) 10 MG tablet Take 1 tablet (10 mg total) by mouth every 6 (six) hours as needed for nausea or vomiting. 90 tablet 5  .  ramipril (ALTACE) 5 MG capsule TAKE 2 CAPSULES BY MOUTH EVERY DAY 180 capsule 1  . ranitidine (ZANTAC) 75 MG tablet Take 75 mg by mouth daily as needed for heartburn (acid reflux).     Marland Kitchen senna (SENOKOT) 8.6 MG TABS tablet Take 1 tablet (8.6 mg total) by mouth at bedtime as needed for mild constipation. 30 each 1  . sodium fluoride (FLUORISHIELD) 1.1 % GEL dental gel Instill one drop of gel per tooth space of fluoride tray. Place over teeth for 5 minutes. Remove. Spit out excess. Repeat nightly. 120 mL prn  . sucralfate (CARAFATE) 1 G tablet Dissolve 1 tablet in 75mL H2O and swallow up to QID,PRN sore throat. 60 tablet 5  . sulindac (CLINORIL) 200 MG tablet Take 0.5 tablets (100 mg total) by mouth daily as needed. 45 tablet 1   Current Facility-Administered Medications on File Prior to Visit  Medication Dose Route Frequency Provider Last Rate Last Dose  . 0.9 %  sodium chloride infusion   Intravenous Once Holley Bouche, NP      . 0.9 %  sodium chloride infusion   Intravenous Once Holley Bouche, NP        BP 140/80 mmHg  Pulse 94  Temp(Src) 98.3 F (36.8 C) (Oral)  Wt 116 lb (52.617 kg)  Wt Readings from Last 3 Encounters:  03/22/15 116 lb (52.617 kg)  03/13/15 119 lb 9.6 oz (54.25 kg)  02/26/15 121 lb 8 oz (55.112 kg)     Objective:   Physical Exam  Constitutional: He is oriented to person, place, and time.  Thin 70 y/o male  HENT:  Head: Normocephalic and atraumatic.  Neck: Neck supple.  No neck tenderness  Cardiovascular: Normal rate, regular rhythm and normal heart sounds.  Exam reveals no gallop.   No murmur heard. Pulmonary/Chest: Effort normal and breath sounds normal. He has no  wheezes.  Abdominal: Soft. Bowel sounds are normal. He exhibits no mass. There is no tenderness. There is no rebound.  Neurological: He is alert and oriented to person, place, and time.  Skin: Skin is warm and dry.  Psychiatric: He has a normal mood and affect. His behavior is normal.        Assessment & Plan:   1.  Tonsil cancer 2.  Nausea 3.  Weight loss 4.  Hypertension 5.  CAD 6.  Hyperlipidemia  Right Tonsil cancer - stage II managed by oncology.  He finished radiation therapy.  PET scan planned in March, 2017.  He is struggling with significant nausea and weight loss. I suggest he decrease his fentanyl patch to 25 g. Restart lidocaine spray to minimize discomfort with eating. Trial of lower dose of Zofran 2 mg 3 times a day. If he experiences significant constipation with restarting Zofran, use Amitiza 24 mcg tid with meals.  Continue meal replacement (ensure).  If unable to maintain weight, consider J-tube.  He has referral gastroenterologist already  Decrease metoprolol ER to 25 mg.  Continue to hold altace  Continue to hold metformin, lipitor, allopurinol

## 2015-03-22 NOTE — Patient Instructions (Addendum)
Contact your cancer specialist to start decreasing dose of fentayl patch Please complete the following lab tests before your next follow up appointment: BMET, A1c - 250.00

## 2015-03-22 NOTE — Progress Notes (Signed)
Pre visit review using our clinic review tool, if applicable. No additional management support is needed unless otherwise documented below in the visit note. 

## 2015-03-26 ENCOUNTER — Other Ambulatory Visit: Payer: Self-pay | Admitting: Adult Health

## 2015-03-26 ENCOUNTER — Telehealth: Payer: Self-pay | Admitting: Adult Health

## 2015-03-26 DIAGNOSIS — C099 Malignant neoplasm of tonsil, unspecified: Secondary | ICD-10-CM

## 2015-03-26 MED ORDER — FENTANYL 25 MCG/HR TD PT72: 25.0000 ug | MEDICATED_PATCH | TRANSDERMAL | Status: DC

## 2015-03-26 MED FILL — fentaNYL 25 MCG/HR PT72: 25 | 15 days supply | Qty: 5 | Fill #0

## 2015-03-26 NOTE — Telephone Encounter (Signed)
I received a call from the patient's wife stating that Kevin Mcclain had begun having nausea and vomiting again, which was worse after his pain patch (Fentanyl 50 mcg) was changed yesterday.  They met with their PCP last week who asked that the patient/wife speak to me about decreasing the dose of the Fentanyl as this could be contributing to his N&V, in the setting of his weight loss.  I agreed that this could certainly be possible, as N&V can be a side effect of opiates, however he has tolerated this dose in the past before the N&V started.  He is still requiring the Hydrocodone solution Q4H for throat pain, particularly with eating.    Given that his GI consult is pending at the end of the month and in the setting of continued N&V, I have decreased the Fentanyl to 2mg.  I let her know that we may need to make other adjustments if his pain is not well managed with the decreased dose.  I wrote a new prescription today (Fentanyl 25 mcg transdermal Q72H, #5, no refills).  Ms. PSargeantwill come to the cancer center today to pick up the new prescription.  [place] and the patient's wife would be bringing the prescription by the pharmacy later today.  I encouraged Kevin Mcclain to call me with any other questions or concerns.  We will monitor his response to decreased opiates, particularly with regards to N&V.  They know to call me with any other concerns.  I will continue to follow-up with them.   GMike Craze NP S[phone]

## 2015-03-28 ENCOUNTER — Telehealth: Payer: Self-pay

## 2015-03-28 NOTE — Telephone Encounter (Signed)
-----   Message from Achille sent at 03/28/2015  1:45 PM EST ----- Regarding: FW: Can you see tomorrow   ----- Message -----    From: Jerene Bears, MD    Sent: 03/28/2015  11:52 AM      To: Larina Bras, CMA Subject: FW: Can you see tomorrow                       Gessner's father-in-law Needs to be worked in Engineer, building services  ----- Message -----    From: Gatha Mayer, MD    Sent: 03/28/2015   8:03 AM      To: Jerene Bears, MD Subject: Can you see tomorrow                           Nausea/vomiting after XRT

## 2015-03-28 NOTE — Telephone Encounter (Signed)
Pt scheduled to see Dr. Hilarie Fredrickson tomorrow at 3:30pm, pts wife aware of appt.

## 2015-03-29 ENCOUNTER — Encounter: Payer: Self-pay | Admitting: Internal Medicine

## 2015-03-29 ENCOUNTER — Ambulatory Visit (INDEPENDENT_AMBULATORY_CARE_PROVIDER_SITE_OTHER): Payer: Medicare Other | Admitting: Internal Medicine

## 2015-03-29 VITALS — BP 118/64 | HR 80 | Ht 64.0 in | Wt 116.0 lb

## 2015-03-29 DIAGNOSIS — C099 Malignant neoplasm of tonsil, unspecified: Secondary | ICD-10-CM | POA: Diagnosis not present

## 2015-03-29 DIAGNOSIS — Z923 Personal history of irradiation: Secondary | ICD-10-CM

## 2015-03-29 DIAGNOSIS — R634 Abnormal weight loss: Secondary | ICD-10-CM

## 2015-03-29 DIAGNOSIS — R112 Nausea with vomiting, unspecified: Secondary | ICD-10-CM | POA: Diagnosis not present

## 2015-03-29 MED ORDER — ONDANSETRON HCL 4 MG PO TABS: 2.0000 mg | ORAL_TABLET | Freq: Every morning | ORAL | Status: DC

## 2015-03-29 MED FILL — LIDOCAINE 2% VISCOUS SOLN: 2 | 5 days supply | Qty: 100 | Fill #1

## 2015-03-29 NOTE — Progress Notes (Signed)
Patient ID: Kevin Mcclain, male   DOB: [birth year], 70 y.o.   MRN: [id number] HPI:  Kevin Mcclain is a 70 year old male known to me from prior colonoscopy with a past medical history of tonsillar cancer status post radiation therapy, GERD, CAD, diabetes, hypertension, hyperlipidemia , anxiety and depression who is seen in follow-up for recent nausea and vomiting. He is here today with his wife.   He has been struggling recently with weight loss and trouble eating particularly since diagnosis with tonsillar cancer and resultant radiation therapy. Most recently he's been having issues with early morning nausea and vomiting. He feels like the nausea is a bigger issue in the morning particularly after waking. He reports at night with his decreased saliva production his mouth and throat get "very dry" with thick mucus which he then swallows and leads to morning nausea. This has not been an every day problem but more on than off recently. He was given Zofran which really helped the nausea but at 4 mg resulted in severe constipation also making him feel miserable. Primary care gave him Amitiza but he never started it but simply chose to stop the Zofran.  Earlier in Jan 2017 he was started on pantoprazole 40 mg by oncology and this has helped epigastric discomfort and he feels also helped with his nausea. Appetite fluctuates and he is able to eat soft solids but has to eat and chew slowly. He does feel very dry mouth and drinks water frequently to help the food go down. He denies true dysphagia today. For example for dinner last night he ate fried fish and french fries. He is using topical lidocaine for irritation and soreness in the back of his throat which only provides a transient benefit. He wears a fentanyl patch at 25 g and intermittently uses liquid hydrocodone. Most recently the constipation seems to have improved.  He denies melena or rectal bleeding.  Over the last 2 days under the advice of his son-in-law,  Silvano Rusk, he used 2 mg of Zofran each morning and has not had vomiting or nausea.  Past Medical History  Diagnosis Date  . Depression   . Arthritis   . Diabetes mellitus type II   . Kidney stones   . CAD (coronary artery disease)     severe CAD-s/p MI 2000 with PTCA, s/p CABG 2005-by Dr Vanetta Mulders at [facility] EF 50-55%  . Hypertension   . Hyperlipidemia   . Gout   . Vertigo   . Cancer ([place]) 10/12/2014    right tonsil=squamous cell  carcinaoma  . Allergy   . Anxiety     anxious about radiation  . GERD (gastroesophageal reflux disease)     zantac- prn    Past Surgical History  Procedure Laterality Date  . Colonoscopy    . Coronary artery bypass graft  2005    6 grafts  . Cholecystectomy  2012  . Colonoscopy w/ polypectomy  2015  . Cystoscopy      kidney stone removal  . Lithotripsy    . Nasal sinus surgery Right 11/14/2014    Procedure: RIGHT ENDOSCOPIC ANTROSTOMY AND ANTERIOR ETHMOIDECTOMY ;  Surgeon: Jodi Marble, MD;  Location: [facility];  Service: ENT;  Laterality: Right;  . Multiple extractions with alveoloplasty N/A 11/14/2014    Procedure: Extraction of tooth #2 and 31 with alveoloplasty after sectioning of bridge at distal #29 with full mouth debridement of remaining dention;  Surgeon: Lenn Cal, DDS;  Location: [facility];  Service:  Oral Surgery;  Laterality: N/A;    Outpatient Prescriptions Prior to Visit  Medication Sig Dispense Refill  . fentaNYL (DURAGESIC - DOSED MCG/HR) 25 MCG/HR patch Place 1 patch (25 mcg total) onto the skin every 3 (three) days. 5 patch 0  . HYDROcodone-acetaminophen (HYCET) 7.5-325 mg/15 ml solution Take 15-35mL every 4 hrs PRN pain. Do not exceed 15mL over 24 hours. 3500 mL 0  . lidocaine (XYLOCAINE) 2 % solution Mix 1 part 2%viscous lidocaine,1part H2O.Swish and/or swallow 38ml of this mixture,76min before meals and at bedtime, up to QID 100 mL 5  . lubiprostone (AMITIZA) 24 MCG capsule Take 1 capsule (24 mcg total) by mouth 2 (two)  times daily between meals as needed for constipation. 60 capsule 2  . nitroGLYCERIN (NITROSTAT) 0.4 MG SL tablet Place 1 tablet (0.4 mg total) under the tongue every 5 (five) minutes as needed. 50 tablet 11  . ondansetron (ZOFRAN-ODT) 8 MG disintegrating tablet Take 1 tablet (8 mg total) by mouth every 8 (eight) hours as needed for nausea or vomiting. 30 tablet 5  . OVER THE COUNTER MEDICATION Place 1 drop into both eyes 3 (three) times daily as needed (dry eyes/ irritation). Clear Cooling Eye Drops    . pantoprazole (PROTONIX) 40 MG tablet Take 1 tablet (40 mg total) by mouth daily. 30 tablet 5  . desoximetasone (TOPICORT) 0.25 % cream APPLY TOPICALLY TWICE DAILY 30 g 5  . LORazepam (ATIVAN) 0.5 MG tablet Take 1 tablet (0.5 mg total) by mouth every 6 (six) hours as needed (for nausea and vomiting or anxiety.). 90 tablet 0  . metoprolol succinate (TOPROL-XL) 25 MG 24 hr tablet Take 1 tablet (25 mg total) by mouth daily. 90 tablet 1  . nystatin (MYCOSTATIN) 100000 UNIT/ML suspension Take 5 mLs (500,000 Units total) by mouth 4 (four) times daily. May use 4 times per day and as needed. 60 mL 5  . ondansetron (ZOFRAN) 4 MG tablet Take 0.5 tablets (2 mg total) by mouth every 8 (eight) hours as needed for nausea or vomiting. 45 tablet 1  . ranitidine (ZANTAC) 75 MG tablet Take 75 mg by mouth daily as needed for heartburn (acid reflux).     Marland Kitchen senna (SENOKOT) 8.6 MG TABS tablet Take 1 tablet (8.6 mg total) by mouth at bedtime as needed for mild constipation. 30 each 1  . sodium fluoride (FLUORISHIELD) 1.1 % GEL dental gel Instill one drop of gel per tooth space of fluoride tray. Place over teeth for 5 minutes. Remove. Spit out excess. Repeat nightly. 120 mL prn  . sucralfate (CARAFATE) 1 G tablet Dissolve 1 tablet in 105mL H2O and swallow up to QID,PRN sore throat. 60 tablet 5   Facility-Administered Medications Prior to Visit  Medication Dose Route Frequency Provider Last Rate Last Dose  . 0.9 %  sodium  chloride infusion   Intravenous Once Holley Bouche, NP      . 0.9 %  sodium chloride infusion   Intravenous Once Holley Bouche, NP        Allergies  Allergen Reactions  . Benzonatate Swelling    "swells my throat"  . Codeine Itching  . Sitagliptin Phosphate Itching    Family History  Problem Relation Age of Onset  . Heart attack Mother     deceased age 11 secondary  . Heart disease Mother   . Colon cancer Father   . Stroke Father     deceased age 43  . Heart disease Father   .  Cancer Father     colon  . Stroke Sister 43  . Heart disease Sister   . Coronary artery disease Brother     s/p bypass surgery  . Heart disease Brother   . Heart disease Brother     Social History  Substance Use Topics  . Smoking status: Never Smoker   . Smokeless tobacco: Never Used  . Alcohol Use: No    ROS: As per history of present illness, otherwise negative  BP 118/64 mmHg  Pulse 80  Ht 5\' 4"  (1.626 m)  Wt 116 lb (52.617 kg)  BMI 19.90 kg/m2 Constitutional: Well-developed and well-nourished. No distress. HEENT: Normocephalic and atraumatic.Conjunctivae are normal.  No scleral icterus. Cardiovascular: Normal rate, regular rhythm and intact distal pulses. No M/R/G Pulmonary/chest: Effort normal and breath sounds normal. No wheezing, rales or rhonchi. Abdominal: Soft, nontender, nondistended. Bowel sounds active throughout. There are no masses palpable. No hepatosplenomegaly. Extremities: no clubbing, cyanosis, or edema Neurological: Alert and oriented to person place and time. Skin: Skin is warm and dry. No rashes noted. Psychiatric: Normal mood and affect. Behavior is normal.  RELEVANT LABS AND IMAGING: CBC    Component Value Date/Time   WBC 5.8 03/13/2015 1120   WBC 10.3 01/06/2015 1422   RBC 4.34 03/13/2015 1120   RBC 4.90 01/06/2015 1422   HGB 13.1 03/13/2015 1120   HGB 15.2 01/06/2015 1422   HCT 39.5 03/13/2015 1120   HCT 43.3 01/06/2015 1422   PLT 201  03/13/2015 1120   PLT 267 01/06/2015 1422   MCV 90.9 03/13/2015 1120   MCV 88.4 01/06/2015 1422   MCH 30.2 03/13/2015 1120   MCH 31.0 01/06/2015 1422   MCHC 33.3 03/13/2015 1120   MCHC 35.1 01/06/2015 1422   RDW 13.8 03/13/2015 1120   RDW 12.9 01/06/2015 1422   LYMPHSABS 0.4* 03/13/2015 1120   LYMPHSABS 0.5* 01/06/2015 1422   MONOABS 0.4 03/13/2015 1120   MONOABS 0.3 01/06/2015 1422   EOSABS 0.2 03/13/2015 1120   EOSABS 0.0 01/06/2015 1422   BASOSABS 0.1 03/13/2015 1120   BASOSABS 0.0 01/06/2015 1422    CMP     Component Value Date/Time   NA 139 03/13/2015 1120   NA 138 01/06/2015 1422   K 4.2 03/13/2015 1120   K 4.5 01/06/2015 1422   CL 100* 01/06/2015 1422   CO2 30* 03/13/2015 1120   CO2 21* 01/06/2015 1422   GLUCOSE 122 03/13/2015 1120   GLUCOSE 185* 01/06/2015 1422   BUN 9.6 03/13/2015 1120   BUN 27* 01/06/2015 1422   CREATININE 0.8 03/13/2015 1120   CREATININE 1.02 01/06/2015 1422   CREATININE 0.81 09/19/2010 1339   CALCIUM 9.6 03/13/2015 1120   CALCIUM 10.4* 01/06/2015 1422   PROT 6.4 03/13/2015 1120   PROT 8.0 01/06/2015 1422   ALBUMIN 3.9 03/13/2015 1120   ALBUMIN 4.6 01/06/2015 1422   AST 20 03/13/2015 1120   AST 29 01/06/2015 1422   ALT 22 03/13/2015 1120   ALT 37 01/06/2015 1422   ALKPHOS 52 03/13/2015 1120   ALKPHOS 76 01/06/2015 1422   BILITOT 0.79 03/13/2015 1120   BILITOT 1.8* 01/06/2015 1422   GFRNONAA >60 01/06/2015 1422   GFRAA >60 01/06/2015 1422    ASSESSMENT/PLAN: 70 year old male known to me from prior colonoscopy with a past medical history of tonsillar cancer status post radiation therapy, GERD, CAD, diabetes, hypertension, hyperlipidemia , anxiety and depression who is seen in follow-up for recent nausea and vomiting.  1. N/V in the setting  of tonsillar cancer s/p XRT --  Issues with nausea and vomiting are likely multifactorial.   He has benefited from Zofran but at 4 mg resulted in constipation. After discussion I recommended Zofran  2 mg each morning with an additional dose later in the day if necessary. I would like him to start MiraLAX 17 g daily to hopefully prevent constipation which can be related to Zofran. I will have him hold Amitiza for now as I expect this will give him diarrhea and may be too strong. If MiraLAX results in diarrhea I recommended that he decrease to every other day dosing. I would like him to continue pantoprazole 40 mg daily. We discussed observation versus upper endoscopy. EGD would allow for the exclusion of esophagitis, be it radiation or candida induced, and also evaluate for gastritis or even ulcer disease. After our discussion, including the risks, benefits and alternatives, he and his wife would like to proceed with upper endoscopy. I feel this is reasonable.   If nausea and vomiting continue to be a problem despite Zofran acid they notify me immediately and they voiced understanding.  25 minutes spent with the patient today. Greater than 50% was spent in counseling and coordination of care with the patient   [place], [place], [place] [id number]

## 2015-03-29 NOTE — Patient Instructions (Signed)
You have been scheduled for an endoscopy. Please follow written instructions given to you at your visit today. If you use inhalers (even only as needed), please bring them with you on the day of your procedure. Your physician has requested that you go to www.startemmi.com and enter the access code given to you at your visit today. This web site gives a general overview about your procedure. However, you should still follow specific instructions given to you by our office regarding your preparation for the procedure. We have sent the following medications to your pharmacy for you to pick up at your convenience: Zofran  Hold your Amitiza for now Use miralax 17 g daily, if diarrhea develops use 17 g every other day Continue Pantoprazole 40 mg daily

## 2015-04-01 ENCOUNTER — Ambulatory Visit (AMBULATORY_SURGERY_CENTER): Payer: Medicare Other | Admitting: Internal Medicine

## 2015-04-01 ENCOUNTER — Encounter: Payer: Self-pay | Admitting: Internal Medicine

## 2015-04-01 VITALS — BP 146/75 | HR 67 | Temp 97.4°F | Resp 20 | Ht 64.0 in | Wt 116.0 lb

## 2015-04-01 DIAGNOSIS — R112 Nausea with vomiting, unspecified: Secondary | ICD-10-CM

## 2015-04-01 DIAGNOSIS — C099 Malignant neoplasm of tonsil, unspecified: Secondary | ICD-10-CM

## 2015-04-01 DIAGNOSIS — K3189 Other diseases of stomach and duodenum: Secondary | ICD-10-CM | POA: Diagnosis not present

## 2015-04-01 MED ORDER — SODIUM CHLORIDE 0.9 % IV SOLN: 500.0000 mL | INTRAVENOUS | Status: DC

## 2015-04-01 NOTE — Patient Instructions (Signed)
YOU HAD AN ENDOSCOPIC PROCEDURE TODAY AT [facility]:   Refer to the procedure report that was given to you for any specific questions about what was found during the examination.  If the procedure report does not answer your questions, please call your gastroenterologist to clarify.  If you requested that your care partner not be given the details of your procedure findings, then the procedure report has been included in a sealed envelope for you to review at your convenience later.  YOU SHOULD EXPECT: Some feelings of bloating in the abdomen. Passage of more gas than usual.  Walking can help get rid of the air that was put into your GI tract during the procedure and reduce the bloating. If you had a lower endoscopy (such as a colonoscopy or flexible sigmoidoscopy) you may notice spotting of blood in your stool or on the toilet paper. If you underwent a bowel prep for your procedure, you may not have a normal bowel movement for a few days.  Please Note:  You might notice some irritation and congestion in your nose or some drainage.  This is from the oxygen used during your procedure.  There is no need for concern and it should clear up in a day or so.  SYMPTOMS TO REPORT IMMEDIATELY:    Following upper endoscopy (EGD)  Vomiting of blood or coffee ground material  New chest pain or pain under the shoulder blades  Painful or persistently difficult swallowing  New shortness of breath  Fever of 100F or higher  Black, tarry-looking stools  For urgent or emergent issues, a gastroenterologist can be reached at any hour by calling [phone].   DIET: Your first meal following the procedure should be a small meal and then it is ok to progress to your normal diet. Heavy or fried foods are harder to digest and may make you feel nauseous or bloated.  Likewise, meals heavy in dairy and vegetables can increase bloating.  Drink plenty of fluids but you should avoid alcoholic beverages  for 24 hours.  ACTIVITY:  You should plan to take it easy for the rest of today and you should NOT DRIVE or use heavy machinery until tomorrow (because of the sedation medicines used during the test).    FOLLOW UP: Our staff will call the number listed on your records the next business day following your procedure to check on you and address any questions or concerns that you may have regarding the information given to you following your procedure. If we do not reach you, we will leave a message.  However, if you are feeling well and you are not experiencing any problems, there is no need to return our call.  We will assume that you have returned to your regular daily activities without incident.  If any biopsies were taken you will be contacted by phone or by letter within the next 1-3 weeks.  Please call us at [phone] if you have not heard about the biopsies in 3 weeks.    SIGNATURES/CONFIDENTIALITY: You and/or your care partner have signed paperwork which will be entered into your electronic medical record.  These signatures attest to the fact that that the information above on your After Visit Summary has been reviewed and is understood.  Full responsibility of the confidentiality of this discharge information lies with you and/or your care-partner.  Continue pantoprazole 40 mg,  Continue Zofran 2 mg daily with additional 2 mg later if necessary for nausea  Miralax 17 g daily to avoid constipation associated with zofran.

## 2015-04-01 NOTE — Op Note (Signed)
[place]  [address] [place], [id number]   ENDOSCOPY PROCEDURE REPORT  PATIENT: Kevin, Mcclain  MR#: [id number] BIRTHDATE: Apr 23, 1945 , 69  yrs. old GENDER: male ENDOSCOPIST: Jerene Bears, MD REFERRED BY:  Drema Pry, DO PROCEDURE DATE:  04/01/2015 PROCEDURE:  EGD, diagnostic and EGD w/ biopsy ASA CLASS:     Class III INDICATIONS:  nausea, vomiting, and recent radiation therapy for tonsillar cancer. MEDICATIONS: Monitored anesthesia care and Propofol 100 mg IV TOPICAL ANESTHETIC: none  DESCRIPTION OF PROCEDURE: After the risks benefits and alternatives of the procedure were thoroughly explained, informed consent was obtained.  The LB [id number] endoscope was introduced through the mouth and advanced to the second portion of the duodenum , Without limitations.  The instrument was slowly withdrawn as the mucosa was fully examined.  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  ESOPHAGUS: Mild resistance at the UES passed with gentle pressure. Superficial mucosa tear seen on withdrawal across the UES.  The mucosa of the esophagus appeared normal.  STOMACH: The mucosa of the stomach appeared normal.  Cold forcep biopsies were taken at the gastric body, antrum and angularis to evaluate for h.  pylori.  DUODENUM: The duodenal mucosa showed no abnormalities in the bulb and 2nd part of the duodenum.  Retroflexed views revealed no abnormalities.     The scope was then withdrawn from the patient and the procedure completed.  COMPLICATIONS: There were no immediate complications.  ENDOSCOPIC IMPRESSION: 1.   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   The mucosa of the esophagus appeared normal 3.   The mucosa of the stomach appeared normal 4.   The duodenal mucosa showed no abnormalities in the bulb and 2nd part of the duodenum  RECOMMENDATIONS: 1.  Await biopsy results 2.  Continue  pantoprazole 40 mg daily.  Continue Zofran 2 mg daily with an additional 2 mg later in the day if necessary for nausea. MiraLAX 17 g daily to avoid constipation associated with Zofran    eSigned:  Jerene Bears, MD 04/01/2015 11:03 AM    CC: the patient, Dr. Shawna Orleans,  [name], NP  PATIENT NAME:  Kevin, Mcclain MR#: [id number]

## 2015-04-01 NOTE — Progress Notes (Signed)
A/ox3 pleased with MAC, report to April RN, dentition as pre-procedure

## 2015-04-01 NOTE — Progress Notes (Signed)
Called to room to assist during endoscopic procedure.  Patient ID and intended procedure confirmed with present staff. Received instructions for my participation in the procedure from the performing physician.  

## 2015-04-01 NOTE — Progress Notes (Signed)
Dental advisory given to patient 

## 2015-04-02 ENCOUNTER — Ambulatory Visit: Payer: Self-pay | Admitting: Physician Assistant

## 2015-04-02 ENCOUNTER — Telehealth: Payer: Self-pay | Admitting: Emergency Medicine

## 2015-04-02 NOTE — Telephone Encounter (Signed)
  Follow up Call-  Call back number 04/01/2015 11/15/2013  Post procedure Call Back phone  # [phone]  Permission to leave phone message Yes Yes     Patient questions:  Do you have a fever, pain , or abdominal swelling? No. Pain Score  0 *  Have you tolerated food without any problems? Yes.    Have you been able to return to your normal activities? Yes.    Do you have any questions about your discharge instructions: Diet   No. Medications  No. Follow up visit  No.  Do you have questions or concerns about your Care? No.  Actions: * If pain score is 4 or above: No action needed, pain <4.

## 2015-04-03 ENCOUNTER — Telehealth: Payer: Self-pay | Admitting: Adult Health

## 2015-04-03 ENCOUNTER — Other Ambulatory Visit: Payer: Self-pay | Admitting: Adult Health

## 2015-04-03 DIAGNOSIS — C099 Malignant neoplasm of tonsil, unspecified: Secondary | ICD-10-CM

## 2015-04-03 MED ORDER — HYDROCODONE-ACETAMINOPHEN 7.5-325 MG/15ML PO SOLN: ORAL | Status: DC

## 2015-04-03 MED ORDER — FENTANYL 25 MCG/HR TD PT72: 25.0000 ug | MEDICATED_PATCH | TRANSDERMAL | Status: DC

## 2015-04-03 MED FILL — HYDROCOD-APAP 7.5-325/15ML: 7.5-325 | 14 days supply | Qty: 2500 | Fill #0

## 2015-04-03 NOTE — Telephone Encounter (Signed)
Mrs. Donkor called me to let me know that pt needs a refill of his pain medication (both the Fentanyl and the Hydrocodone/APAP solution).  I have refilled both of these prescriptions and left them for her to pick up at the [facility] at her convenience.   -Fentanyl 25 mcg transdermal Q72H, #5, no refills -Hydrocodone/APAP 7.5/325 mg/15 ml solution, Take 15-47ml Q4Hprn; #2559ml; 0 refills.  *I will be seeing the patient for routine follow-up on 04/15/15.   Mrs. Qadir also wanted to make me aware that they saw Dr. Hilarie Fredrickson with [facility] and EGD was performed.  I have reviewed the procedure report-EGD revealed a superficial tear in the mucosa of his esophagus, as well as edema of arachnoid cartilage likely r/t radiation, but was otherwise normal.  Gastric biopsies taken as precaution.  Dr. Hilarie Fredrickson has started Mr. Sandau on Zofran 2mg  daily, with the option to take an additional 2mg  later in the day if needed.  He has also prescribed for him to take Miralax QHS to prevent constipation with the Zofran.  He will continue the Protonix as well.  Mrs. Winborne states that pt has been able to keep all of his food down for the past 2 days and is overall feeling a little better. I encouraged her to continue to adhere to Dr. Vena Rua recommendations. I look forward to seeing them in follow-up in a couple of weeks and encouraged her to call me with any other questions or concerns. She voiced understanding and appreciation.   [name], NP [place] [phone]

## 2015-04-11 MED FILL — fentaNYL 25 MCG/HR PT72: 25 | 15 days supply | Qty: 5 | Fill #0

## 2015-04-15 ENCOUNTER — Ambulatory Visit (HOSPITAL_BASED_OUTPATIENT_CLINIC_OR_DEPARTMENT_OTHER): Payer: Medicare Other | Admitting: Adult Health

## 2015-04-15 ENCOUNTER — Encounter: Payer: Self-pay | Admitting: Adult Health

## 2015-04-15 VITALS — BP 141/75 | HR 85 | Resp 14 | Wt 115.8 lb

## 2015-04-15 DIAGNOSIS — K117 Disturbances of salivary secretion: Secondary | ICD-10-CM

## 2015-04-15 DIAGNOSIS — R070 Pain in throat: Secondary | ICD-10-CM

## 2015-04-15 DIAGNOSIS — R112 Nausea with vomiting, unspecified: Secondary | ICD-10-CM

## 2015-04-15 DIAGNOSIS — C099 Malignant neoplasm of tonsil, unspecified: Secondary | ICD-10-CM | POA: Diagnosis not present

## 2015-04-15 DIAGNOSIS — E46 Unspecified protein-calorie malnutrition: Secondary | ICD-10-CM

## 2015-04-15 DIAGNOSIS — I890 Lymphedema, not elsewhere classified: Secondary | ICD-10-CM | POA: Diagnosis not present

## 2015-04-15 DIAGNOSIS — R634 Abnormal weight loss: Secondary | ICD-10-CM

## 2015-04-15 MED ORDER — OXYCODONE HCL 5 MG/5ML PO SOLN: 5.0000 mg | ORAL | Status: DC | PRN

## 2015-04-15 MED FILL — oxyCODONE HCL 5 MG/5ML SOLN: 5 | 15 days supply | Qty: 900 | Fill #0

## 2015-04-15 NOTE — Patient Instructions (Signed)
-  Increase your Boosts with milk to 3 per day.  -Stop taking the Hydrocodone with Tylenol and start the Oxycodone liquid in its place. -You can take the oxycodone 5 mg - 10 mg every 4 hours as needed for pain.  -Restart the Sucralfate (Carafate) to help your esophagus continue to heal.  -Keep taking the Miralax to help prevent constipation. -Keep taking the Zofran as directed by Dr. Hilarie Fredrickson.

## 2015-04-15 NOTE — Progress Notes (Signed)
CLINIC:  Survivorship  REASON FOR VISIT:  Routine follow-up for head & neck cancer & to address post-treatment acute survivorship needs.   BRIEF ONCOLOGIC HISTORY:  Oncology History   Tonsil cancer   Staging form: Pharynx - Oropharynx, AJCC 7th Edition     Clinical: Stage II (T2, N0, M0) - Signed by Eppie Gibson, MD on 10/31/2014       Tonsil cancer ([facility])   12/04/2013 Miscellaneous TSH: 2.65; no h/o hypothyroidism.   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    11/06/2014 Imaging CT neck: Right palatine tonsil mass measuring 2.1 x 2.1 x 2.5 cm.  Asymmetric right LNs in level 1B, 2, and level 3.    12/03/2014 - 01/18/2015 Radiation Therapy Right tonsil and bilateral neck / 70 Gy in 35 fractions to gross disease, 63 Gy in 35 fractions to high risk nodal echelons, and 56 Gy in 35 fractions to intermediate risk nodal echelons   01/06/2015 Miscellaneous ER VISIT for dysphagia.  No hospital admission. CT neck done and pt was able to tolerate po liquids afer IV zofran. Discharged home.    01/06/2015 Imaging CT neck: Significant interval treatment response with no residual measurable right tonsil mass. Mild mucosal thickening and hyperenhancement in bilateral oropharynx & epiglottis, likely tx effect. No cervical adenpathy.     INTERVAL HISTORY:  Mr. Corris has been doing "fair" since his last visit with me about 1 month ago.  He continues to struggle with some N&V; he reports an episode this morning and one yesterday as well; he usually is not nauseas daily however.  Since his last visit, he underwent GI consultation and EGD under the care of Dr. Zenovia Jarred at [facility]; EGD showed small tear in esophageal mucosa, but  was otherwise a normal exam.  Mr. Jagielski has been taking his Zofran as recommended by Dr. Hilarie Fredrickson since that time (Zofran 2 mg every day, then may take additional 2 mg later in the day if symptomatic).  He states that he does not require the "extra" 2 mg very often, but has in recent days.  He continues to report throat pain; "it feels like my throat is on fire"; he takes the Hydrocodone solution about 6 times per day.  He is not eating much (see chart below for 24-hour diet recall); he is continuing to lose weight.    Pain:  Burning throat pain; requires Hydrocodone solution 6x/day.   Nutritional Status:  -Intake/Diet: Soft foods/Regular diet; 24-hour diet recall--breakfast: shredded wheat & milk; lunch: 1/2 Boost with milk; dinner: bites of fish filet and french fries and other 1/2 Boost with milk.   -Using a feeding tube? No -Weight change: LOSS ~4 lbs since 03/13/15  Swallowing:   Pain with swallowing;  Has trouble swallowing food because of dry mouth and "it gums up in my mouth and it sometimes hard to get down."   Last ENT visit: Has not seen Dr. Erik Obey since completion of radiation.   Last Dentist visit:   Needs to reschedule; has not had post-XRT dental evaluation  Last TSH:   TSH 1.878 on 03/13/15    ADDITIONAL REVIEW OF SYSTEMS:  Review of Systems  Constitutional: Positive for weight loss. Negative for fever.       (+)  fatigue  HENT: Positive for ear pain and sore throat.        (R) ear "fullness" and pressure at times; "feels like there's fluid in there & I can't hear as well from that ear"   Gastrointestinal: Positive for nausea and vomiting. Negative for diarrhea, constipation and blood in stool.  Genitourinary: Negative for dysuria.  Neurological: Positive for weakness.     CURRENT MEDICATIONS:  Current Outpatient Prescriptions on File Prior to Visit  Medication Sig Dispense Refill  . fentaNYL (DURAGESIC - DOSED MCG/HR) 25 MCG/HR patch Place 1 patch (25 mcg total) onto the  skin every 3 (three) days. 5 patch 0  . lidocaine (XYLOCAINE) 2 % solution Mix 1 part 2%viscous lidocaine,1part H2O.Swish and/or swallow 49ml of this mixture,70min before meals and at bedtime, up to QID 100 mL 5  . nitroGLYCERIN (NITROSTAT) 0.4 MG SL tablet Place 1 tablet (0.4 mg total) under the tongue every 5 (five) minutes as needed. 50 tablet 11  . ondansetron (ZOFRAN) 4 MG tablet Take 0.5 tablets (2 mg total) by mouth every morning. 2nd dose if needed 30 tablet 3  . ondansetron (ZOFRAN-ODT) 8 MG disintegrating tablet Take 1 tablet (8 mg total) by mouth every 8 (eight) hours as needed for nausea or vomiting. 30 tablet 5  . OVER THE COUNTER MEDICATION Place 1 drop into both eyes 3 (three) times daily as needed (dry eyes/ irritation). Clear Cooling Eye Drops    . pantoprazole (PROTONIX) 40 MG tablet Take 1 tablet (40 mg total) by mouth daily. 30 tablet 5   Current Facility-Administered Medications on File Prior to Visit  Medication Dose Route Frequency Provider Last Rate Last Dose  . 0.9 %  sodium chloride infusion   Intravenous Once Holley Bouche, NP      . 0.9 %  sodium chloride infusion   Intravenous Once Holley Bouche, NP        ALLERGIES:  Allergies  Allergen Reactions  . Benzonatate Swelling    "swells my throat"  . Codeine Itching  . Sitagliptin Phosphate Itching   IV FLUID ADMINISTRATION RECORD: Date Volume & IVF  02/07/15 1L NS  02/01/15 1L NS  01/30/15 1L NS   01/29/15 1L NS  01/28/15 (IVF recommended, but pt declined)  01/25/15 1L NS + 30 meq KCl  01/23/15 1L NS + 30 meq KCl  01/22/15 1L NS + 30 meq KCl (+ IV Zofran)  01/18/15 (last day of tx) 1L NS + 30 meq KCl  01/17/15 (during tx) 1L NS + 30 meq KCl  01/16/15 (during tx) 1L NS + 30 meq KCl   01/15/15 (during tx) 1L NS   01/14/15 (during tx) 1L NS   01/12/15 (during tx) 1L NS   01/10/15 (during tx) 1L NS   01/08/15 (during tx) 1L NS            PHYSICAL EXAM:  Filed  Vitals:   04/15/15 1100  BP: 141/75  Pulse: 85  Resp: 14   Filed Weights   04/15/15 1100  Weight: 115 lb 12.8 oz (52.527 kg)   Weight Date  115 lb 12.8 oz (52.527 kg) 04/15/15  119 lb 9.6 oz (54.25 kg) 03/13/15  121 lb 8 oz (55.112 kg) 02/26/15  120 lb 6.4 oz (54.613 kg) 02/18/15  122 lb 8 oz (55.566 kg) 02/11/15  124 lb 9.6 oz (56.518 kg) 02/07/15  125 lb 9.6 oz (56.972 kg) 02/04/15  127 lb 14.4 oz (58.015 kg) 01/28/15  130 lb  6.4 oz (59.149 kg) 01/21/15  132 lb 3.2 oz (59.966 kg) 01/14/15  136 lb 12.8 oz (62.052 kg) 01/07/15  142 lb 12.8 oz (64.774 kg) 12/24/14  154 lb 9.6 oz (70.126 kg) Pre-treatment: 10/31/14          General: Thin male in no acute distress.  Accompanied by his wife today.  HEENT: Head is atraumatic and normocephalic.  Pupils equal and reactive to light. Conjunctivae clear without exudate.  Sclerae anicteric. (R) ear canal with non-impacted cerumen; TM visualized without evidence of acute infection.  (L) ear TM visualized without evidence of infection.  Oral mucosa is pink and dry, but without lesions.  Tongue is dry and with slight white coating; does not appear to be thrush. Posterior oropharynx is only mildly erythematous in small patches (significantly improved from previous visit); no mucositis or open lesions to throat. Lymph: No cervical, supraclavicular, or infraclavicular lymphadenopathy.  Neck: No palpable masses. (+) anterior neck mild lymphedema and mild fibrosis.  Skin on neck is dry & slightly hyperpigmented, but intact.   Cardiovascular: Normal rate and rhythm. Respiratory: Clear to auscultation bilaterally. Chest expansion symmetric without accessory muscle use. GI: Abdomen soft and flat. No tenderness to palpation. Bowel sounds normoactive  GU: Deferred.   Neuro: No focal deficits. Steady gait.   Psych: Normal mood and affect for situation.  Skin: Warm and dry.    LABORATORY DATA:  None at this visit.  DIAGNOSTIC  IMAGING:  None at this visit.    ASSESSMENT & PLAN:  Mr. Winckler is a pleasant 70 y.o. male with history of cancer of the right tonsil, treated with IMRT alone; completed treatment on 01/18/15. Patient presents to survivorship clinic today for routine follow-up and to address acute survivorship needs.  1. Cancer of the right tonsil:  Mr. Osen is continuing to recover from definitive radiation therapy to his head & neck. The patient will see Dr. Isidore Moos on 05/02/2015 for surveillance. PET scan will be done and results reviewed with patient by Dr. Isidore Moos at that visit in 05/2015.   2. Weight loss/Protein-calorie malnutrition: Mr. Oneal has lost about 4 lbs since his last visit to the cancer center about 1 month ago.  Based on his 24-hour diet recall and his admission that this represents his normal daily intake, he is likely only consuming between 500-750 calories per day. We discussed that not having enough calories affects his body's ability to heal more quickly.  I had hoped he would be further along with his weight management, since he is nearly 3 months out from his last radiation treatment.  I do think his healing has been delayed in large part to his inadequate protein-calorie consumption.  I shared this with them and we discussed strategies to help increase his intake including: increase Boost + milk to 3 times per day, while continuing to consume soft/regular diet as tolerated.  If he is able to drink more than 3 Boosts per day, that would be ideal, but we will start there.  He is frustrated about his weight loss.  I offered support and encouragement and asked that he think about his nutrition as "medication" at this point in his recovery, as food is a critical component in his continued healing.  I encouraged several small meals per day in order to consume as many calories as possible during the day.  He will continue to work on this and "push himself" to consume more as he is able  to tolerate.  I will  make our dietician, [name], RD aware as well.   3. Throat pain: His continued throat pain is likely due to his continued healing that has been delayed by poor nutrition (see above).  I do not feel or see anything on physical exam to suggest early cancer recurrence that could contribute to worsening pain.  It is possible that he is experiencing opiate tolerance given the extended period of time he has required relatively high doses of opioids.  Given that he has likely developed tolerance, as well as dose-limiting side effects like nausea & vomiting, we discussed the option of "opioid rotation" to a new medication.  I also want to eliminate the Tylenol from his prn pain medications, as I  want to reduce his risk of liver toxicity.  We discussed several different options for switching the Hydrocodone/APAP, including morphine, Roxanol, Codeine, hydromorphone, etc. and  he has decided to try Oxycodone solution.  I performed calculations for equivalent dosing of hydrocodone to oxycodone and reduced the final daily dose by 30% to account for potential cross-reactivity).  He received a prescription today for Oxycodone solution 5mg /20ml; Take 5 ml - 10 ml every 4 hours as needed for pain, #966ml, no refills.  I instructed him to stop the Hydrocodone/APAP and start the oxycodone instead.  He will continue using the Fentanyl 25 mcg patches, as directed.  He will continue using Lidocaine solution as well. We reviewed potential side effects of opioid rotation that may include some nausea as his body becomes adjusted to this new medication.  If his symptoms are severe and intolerable, I encouraged them to call me to let me know.  If this medication is ineffective or intolerable, then I will consider resuming hydrocodone alone (without Tylenol).    4. Nausea & vomiting/Superficial mucosal esophageal tear: Mr. Sudler continues to experience rather significant intermittent nausea and vomiting.  He  recently underwent EGD with Dr. Zenovia Jarred of [facility] which showed superficial mucosal tear in esophagus, as well as edema of the arachnoid cartilage (likely secondary to radiation); otherwise the exam was normal. Gastric biopsies taken at this time showed reactive gastropathy and were negative for H.pylori. I encouraged him to restart the Sucralfate (Carafate) for symptom management of throat/esophageal burning.  He stopped this several weeks after a few doses because he didn't see that it was effective.  We discussed the potential benefit of the medication is in long-term healing, not in acute pain management.  He voiced understanding and agrees to restart it. He has some of this medication still at home and does not need a refill. He will continue the Protonix. He will also continue the Zofran regimen as recommended by Dr. Hilarie Fredrickson (2mg  daily, then 2mg  later in day prn).  Patient voiced understanding and agrees with this plan.   5. Bowel regimen/Constipation prevention: He is currently not experiencing constipation and the Miralax is maintaining adequate bowel regimen with bowel movements every other day.  I encouraged him to continue Miralax use, particularly in the setting of opiate use.   6. Xerostomia/Dental care: He has not had a post-radiation dental evaluation.  I will ask Gayleen Orem, RN-Head & Neck Navigator to help facilitate getting a dental appointment scheduled with Dr. Erik Obey.  He has significant xerostomia, which he manages by drinking water and using Biotene spray.  I encouraged him to try the Biotine gel as well, as this has offered patients dry mouth relief as well.  He is brushing his teeth and tongue after meals  and I encouraged him to keep up the good work there.  He had stopped using the Nystatin swish, but states that he may restart it given the white coating on his tongue.  It does not look like he has thrush on exam today, only xerostomia. However, I let him know that it is perfectly  safe for him to use the Nystatin swish for symptomatic relief, as needed.   7. Neck lymphedema: We discussed the cause and physiology of lymphedema in patients who have completed radiation therapy to their head & neck.  I offered to send the patient for formal PT referral and management of his neck lymphedema, but he declines at this time.  In the interim, I encouraged the patient to massage his neck, away from his face, to try to help with his symptoms.  He will let me know if he would like formal PT referral/evaluation.    Dispo:  -I will plan to follow-up with Mr. Heimbuch and his wife via phone early next week to assess his symptoms, particularly in the setting of new pain medication.   -I will likely need to see him once more towards the end of the month before his PET scan on 05/02/15 to re-assess his oral intake, N&V, and pain.  -I encouraged the patient and his wife to call me with any questions or concerns before the next visit; they have my direct contact information.    A total of 30 minutes was spent in face-to-face care for this patient, with greater than 50% of that time in counseling and care-coordination.    [name], NP [place] [phone]

## 2015-04-18 MED FILL — PANTOPRAZOLE SOD DR 40 MG T: 40 | 30 days supply | Qty: 30 | Fill #1

## 2015-04-19 ENCOUNTER — Other Ambulatory Visit: Payer: Self-pay | Admitting: Radiation Oncology

## 2015-04-19 ENCOUNTER — Other Ambulatory Visit (INDEPENDENT_AMBULATORY_CARE_PROVIDER_SITE_OTHER): Payer: Medicare Other

## 2015-04-19 DIAGNOSIS — E119 Type 2 diabetes mellitus without complications: Secondary | ICD-10-CM | POA: Diagnosis not present

## 2015-04-19 DIAGNOSIS — C099 Malignant neoplasm of tonsil, unspecified: Secondary | ICD-10-CM

## 2015-04-19 DIAGNOSIS — I10 Essential (primary) hypertension: Secondary | ICD-10-CM | POA: Diagnosis not present

## 2015-04-19 LAB — BASIC METABOLIC PANEL
BUN: 9 mg/dL (ref 6–23)
CHLORIDE: 99 meq/L (ref 96–112)
CO2: 33 meq/L — AB (ref 19–32)
CREATININE: 0.78 mg/dL (ref 0.40–1.50)
Calcium: 10 mg/dL (ref 8.4–10.5)
GFR: 104.08 mL/min (ref >60.00)
GLUCOSE: 142 mg/dL — AB (ref 70–99)
Potassium: 4.8 mEq/L (ref 3.5–5.1)
Sodium: 138 mEq/L (ref 135–145)

## 2015-04-19 LAB — HEMOGLOBIN A1C: HEMOGLOBIN A1C: 5.4 % (ref 4.6–6.5)

## 2015-04-22 ENCOUNTER — Telehealth: Payer: Self-pay | Admitting: Adult Health

## 2015-04-22 ENCOUNTER — Other Ambulatory Visit: Payer: Self-pay | Admitting: Adult Health

## 2015-04-22 DIAGNOSIS — E46 Unspecified protein-calorie malnutrition: Secondary | ICD-10-CM

## 2015-04-22 DIAGNOSIS — C099 Malignant neoplasm of tonsil, unspecified: Secondary | ICD-10-CM

## 2015-04-22 NOTE — Telephone Encounter (Signed)
I received a call from [name], Kevin Mcclain, regarding Kevin Mcclain. She states that he has "new painful blisters to his back and neck."  I let her know that I based on what she shared with me, I am concerned this may be shingles.  I encouraged them to come in today to be evaluated.  However, the patient declines.  He tells [name] that he has to come to the hospital tomorrow to pick up the barium for his upcoming PEG placement, so he would prefer to only make 1 trip.    I will see the patient tomorrow with labs preceding his appt with me; labs at 11 am and visit with me at 11:30 am.  I encouraged Kevin Mcclain to call me back today if he began to feel worse and I would be happy to see him this afternoon, if needed.  She voiced understanding. Otherwise, I will see them tomorrow for labs and evaluation.   [name], NP [place] [phone]

## 2015-04-23 ENCOUNTER — Other Ambulatory Visit (HOSPITAL_BASED_OUTPATIENT_CLINIC_OR_DEPARTMENT_OTHER): Payer: Medicare Other

## 2015-04-23 ENCOUNTER — Ambulatory Visit (HOSPITAL_BASED_OUTPATIENT_CLINIC_OR_DEPARTMENT_OTHER): Payer: Medicare Other | Admitting: Adult Health

## 2015-04-23 ENCOUNTER — Encounter: Payer: Self-pay | Admitting: Adult Health

## 2015-04-23 ENCOUNTER — Encounter: Payer: Self-pay | Admitting: Nutrition

## 2015-04-23 ENCOUNTER — Encounter: Payer: Self-pay | Admitting: *Deleted

## 2015-04-23 VITALS — BP 157/63 | HR 72 | Temp 97.7°F | Resp 16 | Wt 115.3 lb

## 2015-04-23 DIAGNOSIS — C099 Malignant neoplasm of tonsil, unspecified: Secondary | ICD-10-CM | POA: Diagnosis not present

## 2015-04-23 DIAGNOSIS — R070 Pain in throat: Secondary | ICD-10-CM

## 2015-04-23 DIAGNOSIS — M792 Neuralgia and neuritis, unspecified: Secondary | ICD-10-CM | POA: Diagnosis not present

## 2015-04-23 DIAGNOSIS — E43 Unspecified severe protein-calorie malnutrition: Secondary | ICD-10-CM

## 2015-04-23 DIAGNOSIS — E46 Unspecified protein-calorie malnutrition: Secondary | ICD-10-CM

## 2015-04-23 DIAGNOSIS — B029 Zoster without complications: Secondary | ICD-10-CM

## 2015-04-23 DIAGNOSIS — R21 Rash and other nonspecific skin eruption: Secondary | ICD-10-CM | POA: Diagnosis not present

## 2015-04-23 HISTORY — DX: Zoster without complications: B02.9

## 2015-04-23 LAB — CBC WITH DIFFERENTIAL/PLATELET
BASO%: 0.7 % (ref 0.0–2.0)
Basophils Absolute: 0 10*3/uL (ref 0.0–0.1)
EOS ABS: 0.1 10*3/uL (ref 0.0–0.5)
EOS%: 1.6 % (ref 0.0–7.0)
HEMATOCRIT: 37.6 % — AB (ref 38.4–49.9)
HEMOGLOBIN: 13.3 g/dL (ref 13.0–17.1)
LYMPH#: 0.4 10*3/uL — AB (ref 0.9–3.3)
LYMPH%: 6.9 % — AB (ref 14.0–49.0)
MCH: 31.6 pg (ref 27.2–33.4)
MCHC: 35.4 g/dL (ref 32.0–36.0)
MCV: 89.3 fL (ref 79.3–98.0)
MONO#: 0.6 10*3/uL (ref 0.1–0.9)
MONO%: 9.8 % (ref 0.0–14.0)
NEUT%: 81 % — ABNORMAL HIGH (ref 39.0–75.0)
NEUTROS ABS: 4.6 10*3/uL (ref 1.5–6.5)
NRBC: 0 % (ref 0–0)
PLATELETS: 163 10*3/uL (ref 140–400)
RBC: 4.21 10*6/uL (ref 4.20–5.82)
RDW: 13.2 % (ref 11.0–14.6)
WBC: 5.6 10*3/uL (ref 4.0–10.3)

## 2015-04-23 LAB — COMPREHENSIVE METABOLIC PANEL
ALBUMIN: 3.8 g/dL (ref 3.5–5.0)
ALT: 31 U/L (ref 0–55)
ANION GAP: 10 meq/L (ref 3–11)
AST: 23 U/L (ref 5–34)
Alkaline Phosphatase: 65 U/L (ref 40–150)
BILIRUBIN TOTAL: 1.17 mg/dL (ref 0.20–1.20)
BUN: 11.5 mg/dL (ref 7.0–26.0)
CALCIUM: 9.6 mg/dL (ref 8.4–10.4)
CO2: 27 mEq/L (ref 22–29)
CREATININE: 0.8 mg/dL (ref 0.7–1.3)
Chloride: 101 mEq/L (ref 98–109)
EGFR: >90 mL/min/{1.73_m2} (ref >90)
Glucose: 129 mg/dl (ref 70–140)
Potassium: 4.1 mEq/L (ref 3.5–5.1)
Sodium: 137 mEq/L (ref 136–145)
TOTAL PROTEIN: 6.4 g/dL (ref 6.4–8.3)

## 2015-04-23 LAB — MAGNESIUM: MAGNESIUM: 1.8 mg/dL (ref 1.5–2.5)

## 2015-04-23 MED ORDER — VALACYCLOVIR HCL 1 G PO TABS: 1000.0000 mg | ORAL_TABLET | Freq: Three times a day (TID) | ORAL | Status: DC

## 2015-04-23 MED ORDER — VITAL 1.5 CAL PO LIQD: ORAL | Status: DC

## 2015-04-23 MED ORDER — OXYCODONE HCL 5 MG/5ML PO SOLN: 10.0000 mg | ORAL | Status: DC | PRN

## 2015-04-23 MED FILL — oxyCODONE HCL 5 MG/5ML SOLN: 5 | 11 days supply | Qty: 1000 | Fill #0

## 2015-04-23 MED FILL — valACYclovir HCL 1 GM TABS: 1 | 7 days supply | Qty: 21 | Fill #0

## 2015-04-23 NOTE — Progress Notes (Signed)
I received notification patient will have PEG feeding tube placed on Thursday, February 23. Current weight documented as 115.8 pounds on February 13.  BMI 19.87. Patient continues to have difficulty consuming adequate calories and protein and increasing oral intake. Noted to have persistent nausea and vomiting. Noted labs: Glucose 129, potassium 4.1, magnesium 1.8, albumin 3.8.  Other labs still pending.  Patient meets criteria for severe malnutrition in the context of chronic illness secondary to 25% weight loss over 5 months and severe depletion of body fat and muscle mass.  Estimated nutrition needs: 1775-2000 calories, 72-85 grams protein, greater than 2 L fluid.  Nutrition diagnosis: Unintended weight loss continues.  Intervention:  Recommended patient begin vital 1.5 at 20 mL an hour over 24 hours on day 1 and 2 after tube placement. If tolerated, patient will increase to 25 cc an hour on day 3. If tolerated, patient will increase to 30 cc an hour on day 4. Eventually, goal rate of tube feedings will be vital 1.5 at 55 cc an hour over 24 hours providing 1980 cal, 89 g protein, 1008 mL free water Recommend 120 cc free water flushes 4 times a day plus a minimum of 3 - 8 ounce glasses water daily by mouth or through tube as tolerated. Recommend CMET, magnesium, phosphorus, and pre-albumin prior to starting tube feeding and then 2 days weekly after tube feeding initiated to monitor for refeeding syndrome. Tube feedings will be advanced based on tolerance. [facility] will be notified for tube feeding supplies and formula.  Monitoring, evaluation, goals: Patient will tolerate tube feeding advancement to promote repletion and minimize refeeding syndrome. Monitor blood sugars and make TF changes if needed.  Next visit: To be scheduled.  **Disclaimer: This note was dictated with voice recognition software. Similar sounding words can inadvertently be transcribed and this note may contain  transcription errors which may not have been corrected upon publication of note.**

## 2015-04-23 NOTE — Progress Notes (Signed)
CLINIC:  Survivorship  REASON FOR VISIT:  Routine follow-up for head & neck cancer & to address post-treatment acute survivorship needs.   BRIEF ONCOLOGIC HISTORY:  Oncology History   Tonsil cancer   Staging form: Pharynx - Oropharynx, AJCC 7th Edition     Clinical: Stage II (T2, N0, M0) - Signed by Eppie Gibson, MD on 10/31/2014       Tonsil cancer ([facility])   12/04/2013 Miscellaneous TSH: 2.65; no h/o hypothyroidism.   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    11/06/2014 Imaging CT neck: Right palatine tonsil mass measuring 2.1 x 2.1 x 2.5 cm.  Asymmetric right LNs in level 1B, 2, and level 3.    12/03/2014 - 01/18/2015 Radiation Therapy Right tonsil and bilateral neck / 70 Gy in 35 fractions to gross disease, 63 Gy in 35 fractions to high risk nodal echelons, and 56 Gy in 35 fractions to intermediate risk nodal echelons   01/06/2015 Miscellaneous ER VISIT for dysphagia.  No hospital admission. CT neck done and pt was able to tolerate po liquids afer IV zofran. Discharged home.    01/06/2015 Imaging CT neck: Significant interval treatment response with no residual measurable right tonsil mass. Mild mucosal thickening and hyperenhancement in bilateral oropharynx & epiglottis, likely tx effect. No cervical adenpathy.     INTERVAL HISTORY:  Mr. Burki presents today for follow-up after phone conversation with Mrs. Brander yesterday and also to discuss his upcoming PEG tube placement (scheduled for Thursday with Interventional Radiology).  Mr. Jariwala tells me that he has been experiencing increased "horrible pain" to his left arm that is worse with movement.  Both his left arm and his left upper back area  have been itching and now there is a rash with both dull and sharp pains.  These symptoms started on Friday; he states that he first noticed a rash to his left upper back on Friday as well.  This has required him to take more of the oxycodone solution than normal.  He doesn't think the oxycodone is currently strong enough to manage his pain and would like to talk about increasing his dosage.   Pain:  New rash to left arm/upper back; also throat pain continues. Taking 5-10 mg oxycodone solution every 4 hours as needed.  Nutritional Status:  -Intake/Diet: "I have been forcing myself to drink extra Boost and eat as much as I can."   -Using a feeding tube? No -Weight change: STABLE since last visit on 04/15/15  Swallowing:   Pain with swallowing   Last ENT visit: Has not seen Dr. Erik Obey since completion of radiation.   Last Dentist visit:   Needs to reschedule; has not had post-XRT dental evaluation  Last TSH:   TSH 1.878 on 03/13/15    ADDITIONAL REVIEW OF SYSTEMS:  Review of Systems  Constitutional: Positive for weight loss. Negative for fever.       -Chronic weight loss, although weight is stable today. (+) fatigue and weakness   HENT: Positive for sore throat.   Gastrointestinal: Positive for nausea and vomiting. Negative for diarrhea, constipation and blood in stool.  Genitourinary: Negative for dysuria.  Skin: Positive for itching and rash.       -Left arm and upper back itching,  then noticed a rash, now extreme pain.   Neurological: Positive for weakness.     CURRENT MEDICATIONS:  Current Outpatient Prescriptions on File Prior to Visit  Medication Sig Dispense Refill  . fentaNYL (DURAGESIC - DOSED MCG/HR) 25 MCG/HR patch Place 1 patch (25 mcg total) onto the skin every 3 (three) days. 5 patch 0  . lidocaine (XYLOCAINE) 2 % solution Mix 1 part 2%viscous lidocaine,1part H2O.Swish and/or swallow 19ml of this mixture,34min before meals and at bedtime, up to QID 100 mL 5  . nitroGLYCERIN  (NITROSTAT) 0.4 MG SL tablet Place 1 tablet (0.4 mg total) under the tongue every 5 (five) minutes as needed. 50 tablet 11  . ondansetron (ZOFRAN) 4 MG tablet Take 0.5 tablets (2 mg total) by mouth every morning. 2nd dose if needed 30 tablet 3  . ondansetron (ZOFRAN-ODT) 8 MG disintegrating tablet Take 1 tablet (8 mg total) by mouth every 8 (eight) hours as needed for nausea or vomiting. 30 tablet 5  . OVER THE COUNTER MEDICATION Place 1 drop into both eyes 3 (three) times daily as needed (dry eyes/ irritation). Clear Cooling Eye Drops    . oxyCODONE (ROXICODONE) 5 MG/5ML solution Take 5 mLs (5 mg total) by mouth every 4 (four) hours as needed for moderate pain or severe pain. Take 5-10 mLs (5 mg-10 mg) by mouth every 4 hours as needed for pain. 900 mL 0  . pantoprazole (PROTONIX) 40 MG tablet Take 1 tablet (40 mg total) by mouth daily. 30 tablet 5   Current Facility-Administered Medications on File Prior to Visit  Medication Dose Route Frequency Provider Last Rate Last Dose  . 0.9 %  sodium chloride infusion   Intravenous Once Holley Bouche, NP      . 0.9 %  sodium chloride infusion   Intravenous Once Holley Bouche, NP        ALLERGIES:  Allergies  Allergen Reactions  . Benzonatate Swelling    "swells my throat"  . Codeine Itching  . Sitagliptin Phosphate Itching   IV FLUID ADMINISTRATION RECORD: Date Volume & IVF  02/07/15 1L NS  02/01/15 1L NS  01/30/15 1L NS   01/29/15 1L NS  01/28/15 (IVF recommended, but pt declined)  01/25/15 1L NS + 30 meq KCl  01/23/15 1L NS + 30 meq KCl  01/22/15 1L NS + 30 meq KCl (+ IV Zofran)  01/18/15 (last day of tx) 1L NS + 30 meq KCl  01/17/15 (during tx) 1L NS + 30 meq KCl  01/16/15 (during tx) 1L NS + 30 meq KCl   01/15/15 (during tx) 1L NS   01/14/15 (during tx) 1L NS   01/12/15 (during tx) 1L NS   01/10/15 (during tx) 1L NS   01/08/15 (during tx) 1L NS            PHYSICAL EXAM:  Filed Vitals:     04/23/15 1120  BP: 157/63  Pulse: 72  Temp: 97.7 F (36.5 C)  Resp: 16    Weight Date  115 lb 4.8 oz (52.3 kg) 04/23/15  115 lb 12.8 oz (52.527 kg) 04/15/15  119 lb 9.6 oz (54.25 kg) 03/13/15  121 lb 8 oz (55.112 kg) 02/26/15  120 lb 6.4 oz (54.613 kg) 02/18/15  122 lb 8 oz (55.566 kg) 02/11/15  124 lb 9.6 oz (56.518 kg) 02/07/15  125 lb 9.6 oz (56.972 kg) 02/04/15  127 lb 14.4 oz (58.015 kg) 01/28/15  130 lb 6.4 oz (59.149 kg) 01/21/15  132  lb 3.2 oz (59.966 kg) 01/14/15  136 lb 12.8 oz (62.052 kg) 01/07/15  142 lb 12.8 oz (64.774 kg) 12/24/14  154 lb 9.6 oz (70.126 kg) Pre-treatment: 10/31/14          General: Thin male in no acute distress.  Accompanied by his wife today.  HEENT: Head is atraumatic and normocephalic.  Pupils equal and reactive to light. Conjunctivae clear without exudate.  Sclerae anicteric. Lymph: No cervical, supraclavicular, or infraclavicular lymphadenopathy.  Neck: No palpable masses. (+) anterior neck mild lymphedema and mild fibrosis.  Skin on neck is dry & slightly hyperpigmented, but intact.   Cardiovascular: Normal rate and rhythm. Respiratory: Clear to auscultation bilaterally. Chest expansion symmetric without accessory muscle use. GI: Abdomen soft and flat. No tenderness to palpation. Bowel sounds normoactive  GU: Deferred.   Neuro: No focal deficits. Steady gait.   Psych: Normal mood and affect for situation.  Skin: Vesicular rash in linear pattern to ventral surface of left wrist/forearm. Crusted patch of vesicles to left upper back, trapezius area (see attached photos to this encounter).    LABORATORY DATA:  CMP Latest Ref Rng 04/23/2015 04/19/2015 03/13/2015  Glucose 70 - 140 mg/dl 129 142(H) 122  BUN 7.0 - 26.0 mg/dL 11.5 9 9.6  Creatinine 0.7 - 1.3 mg/dL 0.8 0.78 0.8  Sodium 136 - 145 mEq/L 137 138 139  Potassium 3.5 - 5.1 mEq/L 4.1 4.8 4.2  Chloride 96 - 112 mEq/L - 99 -  CO2 22 - 29 mEq/L 27 33(H) 30(H)   Calcium 8.4 - 10.4 mg/dL 9.6 10.0 9.6  Total Protein 6.4 - 8.3 g/dL 6.4 - 6.4  Total Bilirubin 0.20 - 1.20 mg/dL 1.17 - 0.79  Alkaline Phos 40 - 150 U/L 65 - 52  AST 5 - 34 U/L 23 - 20  ALT 0 - 55 U/L 31 - 22   CBC CBC Latest Ref Rng 04/23/2015 03/13/2015 02/04/2015  WBC 4.0 - 10.3 10e3/uL 5.6 5.8 5.7  Hemoglobin 13.0 - 17.1 g/dL 13.3 13.1 13.9  Hematocrit 38.4 - 49.9 % 37.6(L) 39.5 40.9  Platelets 140 - 400 10e3/uL 163 201 221     Ref. Range 04/23/2015 11:02  Magnesium Latest Ref Range: 1.5-2.5 mg/dl 1.8   *Phosphorus pending *Pre-albumin pending     DIAGNOSTIC IMAGING:  None at this visit.    ASSESSMENT & PLAN:  Mr. Carithers is a pleasant 70 y.o. male with history of cancer of the right tonsil, treated with IMRT alone; completed treatment on 01/18/15. Patient presents to survivorship clinic today for routine follow-up and to address acute survivorship needs.  1. Cancer of the right tonsil:  Mr. Raup continues to recover slowly from definitive treatment with radiation therapy to his head and neck.  He is scheduled to have a restaging PET scan on 05/02/15. In light of his current medical status, I will verify with Dr. Isidore Moos if she would like to maintain the current schedule for his PET scan in early March or potentially postpone the scan until Mr. Captain is progressing more towards healing and has recovered more fully.  I will update the patient and family with Dr. Pearlie Oyster recommendations.   2. Herpes zoster:  Based on the patient's reported history of symptoms & linear distribution vesicular rash along dermatomes on physical exam, Mr. Wisecup has what appears to be an active herpes zoster infection. The vesicles do not cross the midline and are only present on the ventral surface of the forearm/wrist, the dorsal surface of the upper  arm, and the upper back/neck.  (see attached photos).  There are no lesions to the abdomen, chest, or face.  We discussed the  transmission of herpes zoster and he should avoid those without a competent immune system or pregnant women while he has an active infection.  I have e-prescribed Valtrex 1000 mg po TID x 7 days, which is standard of care in the immunocompetent patient.  I instructed him that he could split the pill if it is too large to swallow whole given his continued sore throat.  Also, once his feeding tube is placed, he can crush the Valtrex, mix it with water, and give it via tube until the course is complete. Although it has been 72 hours since the beginning of his symptoms, there appears to be newer lesions today, indicating ongoing active virus spread.  My hope is the Valtrex will decrease the severity of his associated neuropathic pain and support quicker healing of the skin lesions.    3. Severe protein-calorie malnutrition: Mr. Fittro weight has remained stable since his last visit with me on 04/15/15, which is encouraging.  However, given his current weight, malnutrition, and ~40 lb weight loss in the past 6 months, he has agreed to have a PEG tube placed for nutritional support. I verified these wishes again today during our visit. Mr. Shenkman understands that in order to advance his healing, we need to improve his protein and caloric intake.  He and his wife are hopeful the feeding tube will provide the needed healing he needs to feel better and recover from his cancer treatments. I reviewed his labs with him today, which are to serve as his baseline assessment prior to tube feedings. Largely, his labs are within normal limits, which again, is encouraging.  We will continue to monitor his electrolytes in the coming days/weeks as he beings tube feedings.   4. At risk for refeeding syndrome: We briefly discussed the basics of refeeding syndrome and why monitoring for this event will be important in the coming days/weeks.  I will be following the patient closely, along with [name], RD regarding the  patient's tube feeding schedule and formula tolerance.  I will plan to check CMET, Mg, and Phos labs twice next week, as directed by [name], RD.  5. Pending PEG tube placement:  The PEG tube is scheduled to be placed on Thursday, 04/25/15 with Interventional Radiology at [facility].  Gayleen Orem, RN-H&N Nurse Navigator and myself discussed the PEG tube placement procedure with the patient and his family.  The patient and his wife were able to return demonstrate a water flush with a practice feeding tube teaching tool provided by [employer].  We briefly discussed the tube maintenance and site care.  They will receive support from [place] with home health nursing for supplies and tube feed formula.  [name], RD will place orders for the patient's tube feedings.  I spoke with Santiago Glad at [facility] re: the referral for this patient and she will work to coordinate for the patient's needs after the G-tube is placed.   6. Throat pain/Neuralgia secondary to shingles infection: Mr. Wyly is continuing to experience pain to this throat s/p completion of radiation therapy.  His pain management is further complicated by current shingles outbreak.  Therefore, I have increased his oxycodone solution and provided a new prescription today-Oxycodone 5mg /ml; Take 10-15 ml (10-15 mg) every 4 hours as needed for pain, #1000 ml; no refills. (I communicated the  change in this prescription to the [facility] where the patient has his prescriptions filled).  He will continue the Fentanyl patch 25 mcg Q72Hrs, although I am not sure he is getting maximum benefit from the patch given his weight loss.  However, he preferred to continue with the Fentanyl patch and this is not unreasonable at this time.    7. Bowel regimen/Constipation prevention: Encouraged him to continue taking the Miralax as prescribed/recommended by GI. My goal for him is to have a BM every day or every other day, particularly  while taking opiates for pain control.  He understands and agrees with this plan.    8. At risk for skin breakdown: Given his current health status, weight loss, & malnutrition, he is at risk for pressure sores.  Mr. Viggiano reports that his coccyx has been sore in recent weeks after having lost so much weight and there being less subcutaneous fat around his tailbone.  He denies any skin breakdown at this time.  I encouraged him to purchase some Mepilex Border bandages from a medical supply store, which are adhesive and provide cushion and healing for those at risk for decubitus ulcers over bony prominences.  These can be left on for up to 3 days or soiled, and can provide support for those at risk for skin breakdown.     Dispo:  -He will have PEG tube placement on Thursday, 04/25/15. I will follow-up with him on Friday, 04/26/15.  -Patient will pick up barium prep from radiology today and follow their instructions for pre-procedure prep. -Follow tube feeding & water flush orders per [name], RD.. -Referral to [facility] placed for tube feeding support. Confirmed with Santiago Glad via phone call.  -Labs (CMET, Mg, Phos) and visit with dietitian twice next week to monitor for increased risk of refeeding syndrome.      A total of 55 minutes was spent in face-to-face care for this patient, with greater than 50% of that time in counseling and care-coordination.    [name], NP [place] [phone]   *Addendum:  I spoke with Lennette Bihari, [place] from IR to verify that an active shingles infection would not impede their ability to perform the G-tube placement on Thursday, after 48 hours of antiviral therapy.  Lennette Bihari stated that as long as the patient does not have any lesions on his abdomen in the tube placement field, then it should not be a problem to proceed as scheduled.  I communicated his with Marion Downer, Mr. Grosz wife, who voiced verbal  understanding and appreciation.

## 2015-04-24 ENCOUNTER — Other Ambulatory Visit: Payer: Self-pay | Admitting: General Surgery

## 2015-04-24 ENCOUNTER — Other Ambulatory Visit: Payer: Self-pay | Admitting: Adult Health

## 2015-04-24 DIAGNOSIS — Z931 Gastrostomy status: Secondary | ICD-10-CM

## 2015-04-24 DIAGNOSIS — C099 Malignant neoplasm of tonsil, unspecified: Secondary | ICD-10-CM

## 2015-04-24 DIAGNOSIS — E46 Unspecified protein-calorie malnutrition: Secondary | ICD-10-CM

## 2015-04-24 LAB — PREALBUMIN: PREALBUMIN: 22 mg/dL (ref 9–32)

## 2015-04-24 LAB — PHOSPHORUS: Phosphorus, Ser: 3.2 mg/dL (ref 2.5–4.5)

## 2015-04-24 NOTE — Therapy (Signed)
[facility] [address] [place], [place], [address] Phone: [phone]   Fax:  [phone]  Patient Details  Name: Kevin Mcclain MRN: [id number] Date of Birth: 02/06/1946 Referring Provider: Squire, Sarah, M.D.  Encounter Date: 04/24/2015  SPEECH THERAPY DISCHARGE SUMMARY  Visits from Start of Care: one (eval)  Current functional level related to goals / functional outcomes: No goals have been met. Pt was seen for eval only, and was called a number of times to schedule more ST appointments however more pressing medical issues have precluded him from scheduling ST at this time. He will be formally d/c'd at this time. If consult desired, please re-order. Thank you.   Remaining deficits: All deficits assumed remaining.   Education / Equipment: HEP, late effects head/neck radiation on swallowing.  Plan: Patient agrees to discharge.  Patient goals were not met. Patient is being discharged due to not returning since the last visit.  ?????      , ,MS, CCC-SLP   04/24/2015, 8:23 AM  [facility] [address] , [place], [address] Phone: [phone]   Fax:  [phone] 

## 2015-04-25 ENCOUNTER — Encounter (HOSPITAL_COMMUNITY): Payer: Self-pay

## 2015-04-25 ENCOUNTER — Encounter: Payer: Self-pay | Admitting: Adult Health

## 2015-04-25 ENCOUNTER — Ambulatory Visit (HOSPITAL_COMMUNITY)
Admission: RE | Admit: 2015-04-25 | Discharge: 2015-04-25 | Disposition: A | Discharge status: Home / Self Care | Payer: Medicare Other | Source: Ambulatory Visit | Attending: Radiation Oncology | Admitting: Radiation Oncology

## 2015-04-25 VITALS — BP 150/73 | HR 96 | Temp 98.2°F | Resp 18 | Ht 64.0 in | Wt 112.4 lb

## 2015-04-25 DIAGNOSIS — Z9221 Personal history of antineoplastic chemotherapy: Secondary | ICD-10-CM | POA: Insufficient documentation

## 2015-04-25 DIAGNOSIS — R070 Pain in throat: Secondary | ICD-10-CM | POA: Insufficient documentation

## 2015-04-25 DIAGNOSIS — E46 Unspecified protein-calorie malnutrition: Secondary | ICD-10-CM | POA: Diagnosis not present

## 2015-04-25 DIAGNOSIS — C099 Malignant neoplasm of tonsil, unspecified: Secondary | ICD-10-CM | POA: Insufficient documentation

## 2015-04-25 DIAGNOSIS — M109 Gout, unspecified: Secondary | ICD-10-CM | POA: Insufficient documentation

## 2015-04-25 DIAGNOSIS — Z9861 Coronary angioplasty status: Secondary | ICD-10-CM | POA: Insufficient documentation

## 2015-04-25 DIAGNOSIS — M1990 Unspecified osteoarthritis, unspecified site: Secondary | ICD-10-CM | POA: Diagnosis not present

## 2015-04-25 DIAGNOSIS — K219 Gastro-esophageal reflux disease without esophagitis: Secondary | ICD-10-CM | POA: Insufficient documentation

## 2015-04-25 DIAGNOSIS — Z951 Presence of aortocoronary bypass graft: Secondary | ICD-10-CM | POA: Insufficient documentation

## 2015-04-25 DIAGNOSIS — I10 Essential (primary) hypertension: Secondary | ICD-10-CM | POA: Insufficient documentation

## 2015-04-25 DIAGNOSIS — Z79899 Other long term (current) drug therapy: Secondary | ICD-10-CM | POA: Insufficient documentation

## 2015-04-25 DIAGNOSIS — E119 Type 2 diabetes mellitus without complications: Secondary | ICD-10-CM | POA: Insufficient documentation

## 2015-04-25 DIAGNOSIS — R638 Other symptoms and signs concerning food and fluid intake: Secondary | ICD-10-CM

## 2015-04-25 DIAGNOSIS — Z923 Personal history of irradiation: Secondary | ICD-10-CM | POA: Diagnosis not present

## 2015-04-25 DIAGNOSIS — E860 Dehydration: Secondary | ICD-10-CM

## 2015-04-25 DIAGNOSIS — Z79891 Long term (current) use of opiate analgesic: Secondary | ICD-10-CM | POA: Diagnosis not present

## 2015-04-25 DIAGNOSIS — E785 Hyperlipidemia, unspecified: Secondary | ICD-10-CM | POA: Insufficient documentation

## 2015-04-25 DIAGNOSIS — I2510 Atherosclerotic heart disease of native coronary artery without angina pectoris: Secondary | ICD-10-CM | POA: Insufficient documentation

## 2015-04-25 DIAGNOSIS — I252 Old myocardial infarction: Secondary | ICD-10-CM | POA: Diagnosis not present

## 2015-04-25 HISTORY — DX: Zoster without complications: B02.9

## 2015-04-25 LAB — CBC
HCT: 37.9 % — ABNORMAL LOW (ref 39.0–52.0)
Hemoglobin: 13 g/dL (ref 13.0–17.0)
MCH: 31.1 pg (ref 26.0–34.0)
MCHC: 34.3 g/dL (ref 30.0–36.0)
MCV: 90.7 fL (ref 78.0–100.0)
PLATELETS: 152 10*3/uL (ref 150–400)
RBC: 4.18 MIL/uL — ABNORMAL LOW (ref 4.22–5.81)
RDW: 12.9 % (ref 11.5–15.5)
WBC: 6.4 10*3/uL (ref 4.0–10.5)

## 2015-04-25 LAB — APTT: aPTT: 38 seconds — ABNORMAL HIGH (ref 24–37)

## 2015-04-25 LAB — PROTIME-INR
INR: 1.13 (ref 0.00–1.49)
PROTHROMBIN TIME: 14.3 s (ref 11.6–15.2)

## 2015-04-25 MED ORDER — SODIUM CHLORIDE 0.9 % IV SOLN: INTRAVENOUS | Status: AC

## 2015-04-25 MED ORDER — ONDANSETRON HCL 4 MG/2ML IJ SOLN: 4.0000 mg | INTRAMUSCULAR | Status: DC | PRN

## 2015-04-25 MED ORDER — CEFAZOLIN SODIUM-DEXTROSE 2-3 GM-% IV SOLR
INTRAVENOUS | Status: AC
  Filled 2015-04-25: qty 50

## 2015-04-25 MED ORDER — GLUCAGON HCL RDNA (DIAGNOSTIC) 1 MG IJ SOLR
INTRAMUSCULAR | Status: AC
  Filled 2015-04-25: qty 1

## 2015-04-25 MED ORDER — IOHEXOL 300 MG/ML  SOLN
INTRAMUSCULAR | Status: AC | PRN
  Administered 2015-04-25: 10 mL

## 2015-04-25 MED ORDER — HYDROMORPHONE HCL 1 MG/ML IJ SOLN
1.0000 mg | INTRAMUSCULAR | Status: DC | PRN
  Administered 2015-04-25: 0.5 mg via INTRAVENOUS
  Administered 2015-04-25: 0.25 mg via INTRAVENOUS
  Filled 2015-04-25: qty 1

## 2015-04-25 MED ORDER — FENTANYL CITRATE (PF) 100 MCG/2ML IJ SOLN
INTRAMUSCULAR | Status: AC | PRN
  Administered 2015-04-25: 25 ug via INTRAVENOUS
  Administered 2015-04-25: 50 ug via INTRAVENOUS
  Administered 2015-04-25: 25 ug via INTRAVENOUS

## 2015-04-25 MED ORDER — MIDAZOLAM HCL 2 MG/2ML IJ SOLN
INTRAMUSCULAR | Status: AC
  Filled 2015-04-25: qty 6

## 2015-04-25 MED ORDER — HYDROCODONE-ACETAMINOPHEN 5-325 MG PO TABS: 1.0000 | ORAL_TABLET | ORAL | Status: DC | PRN

## 2015-04-25 MED ORDER — LIDOCAINE HCL 1 % IJ SOLN
INTRAMUSCULAR | Status: AC | PRN
  Administered 2015-04-25: 5 mL

## 2015-04-25 MED ORDER — MIDAZOLAM HCL 2 MG/2ML IJ SOLN
INTRAMUSCULAR | Status: AC | PRN
  Administered 2015-04-25: 0.5 mg via INTRAVENOUS
  Administered 2015-04-25: 1 mg via INTRAVENOUS
  Administered 2015-04-25: 0.5 mg via INTRAVENOUS
  Administered 2015-04-25: 1 mg via INTRAVENOUS

## 2015-04-25 MED ORDER — DIPHENHYDRAMINE HCL 50 MG/ML IJ SOLN: 25.0000 mg | Freq: Four times a day (QID) | INTRAMUSCULAR | Status: DC | PRN

## 2015-04-25 MED ORDER — CEFAZOLIN SODIUM-DEXTROSE 2-3 GM-% IV SOLR: 2.0000 g | INTRAVENOUS | Status: DC

## 2015-04-25 MED ORDER — CEFAZOLIN SODIUM-DEXTROSE 2-3 GM-% IV SOLR
2.0000 g | INTRAVENOUS | Status: AC
  Administered 2015-04-25: 2 g via INTRAVENOUS

## 2015-04-25 MED ORDER — SODIUM CHLORIDE 0.9 % IV SOLN: Freq: Once | INTRAVENOUS | Status: DC

## 2015-04-25 MED ORDER — SODIUM CHLORIDE 0.9 % IV SOLN
INTRAVENOUS | Status: DC
  Administered 2015-04-25: 12:00:00 via INTRAVENOUS

## 2015-04-25 MED ORDER — LIDOCAINE HCL 1 % IJ SOLN
INTRAMUSCULAR | Status: AC
  Filled 2015-04-25: qty 20

## 2015-04-25 MED ORDER — FENTANYL CITRATE (PF) 100 MCG/2ML IJ SOLN
INTRAMUSCULAR | Status: AC
  Filled 2015-04-25: qty 4

## 2015-04-25 MED ORDER — GLUCAGON HCL RDNA (DIAGNOSTIC) 1 MG IJ SOLR
INTRAMUSCULAR | Status: AC | PRN
  Administered 2015-04-25: 1 mg via INTRAVENOUS

## 2015-04-25 NOTE — H&P (Signed)
Chief Complaint: malnutrition, needs g-tube Referring Physician:Dr. Eppie [facility] HPI: Kevin Mcclain is an 70 y.o. 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  He is now having issues with throat pain and anorexia.  He along with Dr. Isidore Moos have decided a g-tube would be appropriate for him now to help increase his nutritional status.  The patient does currently have shingles.  He noticed the tingling last Thursday, but the vesicles did not erupt until Saturday.  He began treatment with Valtrex on Monday.  He denies fevers, chills, CP, SOB, diarrhea, etc.  He presents today for placement of a g-tube.  Past Medical History:  Past Medical History  Diagnosis Date  . Depression   . Arthritis   . Diabetes mellitus type II   . Kidney stones   . CAD (coronary artery disease)     severe CAD-s/p MI 2000 with PTCA, s/p CABG 2005-by Dr Vanetta Mulders at [facility] EF 50-55%  . Hypertension   . Hyperlipidemia   . Gout   . Vertigo   . 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 Allergy   . Anxiety     anxious about radiation  . GERD (gastroesophageal reflux disease)     zantac- prn  . Shingles outbreak 04/23/15    left shoulder and left arm    Past Surgical History:  Past Surgical History  Procedure Laterality Date  . Colonoscopy    . Coronary artery bypass graft  2005    6 grafts  . Cholecystectomy  2012  . Colonoscopy w/ polypectomy  2015  . Cystoscopy      kidney stone removal  . Lithotripsy    . Nasal sinus surgery Right 11/14/2014    Procedure: RIGHT ENDOSCOPIC ANTROSTOMY AND ANTERIOR ETHMOIDECTOMY ;  Surgeon: Jodi Marble, MD;  Location: [facility];  Service: ENT;  Laterality: Right;  . Multiple extractions with alveoloplasty N/A 11/14/2014    Procedure: Extraction of tooth #2 and 31 with alveoloplasty after sectioning of bridge at distal #29 with full mouth debridement of remaining dention;  Surgeon: Lenn Cal, DDS;  Location: [facility];  Service: Oral Surgery;  Laterality: N/A;    Family History:  Family History  Problem Relation Age of Onset  . Heart attack Mother     deceased age 48 secondary  . Heart disease Mother   . Colon cancer Father   . Stroke Father     deceased age 34  . Heart disease Father   . Cancer Father     colon  . Stroke Sister 33  . Heart disease Sister   . Coronary artery disease Brother     s/p bypass surgery  . Heart disease Brother   . Heart disease Brother     Social History:  reports that he has never smoked. He has never used smokeless tobacco. He reports that he does not drink alcohol or use illicit drugs.  Allergies:  Allergies  Allergen Reactions  . Benzonatate Swelling    "swells my throat"  . Codeine Itching  . Sitagliptin Phosphate Itching    Medications:   Medication List    ASK your doctor about these medications        feeding supplement (VITAL 1.5 CAL) Liqd  Begin Vital 1.5 at 20 cc/hr continuously on Friday, 1-24.  If tolerated, increase to 25 cc/hr day 3 and 30 cc/hr on Day 4. Flush with 120 cc free water QID  plus and additional 720 cc water by mouth daily.  Please send supplies to home and instruct patient on continuous feeding pump.     fentaNYL 25 MCG/HR patch  Commonly known as:  DURAGESIC - dosed mcg/hr  Place 1 patch (25 mcg total) onto the skin every 3 (three) days.     lidocaine 2 % solution  Commonly known as:  XYLOCAINE  Mix 1 part 2%viscous lidocaine,1part H2O.Swish and/or swallow 79ml of this mixture,53min before meals and at bedtime, up to QID     nitroGLYCERIN 0.4 MG SL tablet  Commonly known as:  NITROSTAT  Place 1 tablet (0.4 mg total) under the tongue every 5 (five) minutes as needed.     ondansetron 4 MG tablet  Commonly known as:  ZOFRAN  Take 0.5 tablets (2 mg total) by mouth every morning. 2nd dose if needed     ondansetron 8 MG disintegrating tablet  Commonly known as:  ZOFRAN-ODT  Take 1 tablet (8 mg total) by mouth every 8  (eight) hours as needed for nausea or vomiting.     OVER THE COUNTER MEDICATION  Place 1 drop into both eyes 3 (three) times daily as needed (dry eyes/ irritation). Clear Cooling Eye Drops     oxyCODONE 5 MG/5ML solution  Commonly known as:  ROXICODONE  Take 10 mLs (10 mg total) by mouth every 4 (four) hours as needed for moderate pain or severe pain. Take 10-15 mLs (10 mg-15 mg) by mouth every 4 hours as needed for pain.     pantoprazole 40 MG tablet  Commonly known as:  PROTONIX  Take 1 tablet (40 mg total) by mouth daily.     valACYclovir 1000 MG tablet  Commonly known as:  VALTREX  Take 1 tablet (1,000 mg total) by mouth 3 (three) times daily.        Please HPI for pertinent positives, otherwise complete 10 system ROS negative.  Mallampati Score: MD Evaluation Airway: WNL Heart: WNL Abdomen: WNL Chest/ Lungs: WNL ASA  Classification: 3 Mallampati/Airway Score: Two  Physical Exam: BP 150/73 mmHg  Pulse 96  Temp(Src) 98.2 F (36.8 C) (Oral)  Resp 18  Ht 5\' 4"  (1.626 m)  Wt 112 lb 6.4 oz (50.984 kg)  BMI 19.28 kg/m2  SpO2 100% Body mass index is 19.28 kg/(m^2). General: pleasant, WD, WN white male who is laying in bed in NAD HEENT: head is normocephalic, atraumatic.  Sclera are noninjected.  PERRL.  Ears and nose without any masses or lesions.  Mouth is pink but dry Heart: regular, rate, and rhythm.  Normal s1,s2. No obvious murmurs, gallops, or rubs noted.  Palpable radial and pedal pulses bilaterally Lungs: CTAB, no wheezes, rhonchi, or rales noted.  Respiratory effort nonlabored Abd: soft, NT, ND, +BS, no masses, hernias, or organomegaly MS: all 4 extremities are symmetrical with no cyanosis, clubbing, or edema. Skin: warm and dry with no masses.  He does have a dermatomal patterned rash along his posterior neck down the posterior aspect of his left shoulder and posterior left arm.  He has some dried vesicles along with some active fresh vesicles. Psych: A&Ox3 with  an appropriate affect.   Labs: Results for orders placed or performed during the hospital encounter of 04/25/15 (from the past 48 hour(s))  CBC     Status: Abnormal   Collection Time: 04/25/15 11:38 AM  Result Value Ref Range   WBC 6.4 4.0 - 10.5 K/uL   RBC 4.18 (L) 4.22 - 5.81 MIL/uL  Hemoglobin 13.0 13.0 - 17.0 g/dL   HCT 37.9 (L) 39.0 - 52.0 %   MCV 90.7 78.0 - 100.0 fL   MCH 31.1 26.0 - 34.0 pg   MCHC 34.3 30.0 - 36.0 g/dL   RDW 12.9 11.5 - 15.5 %   Platelets 152 150 - 400 K/uL    Imaging: No results found.  Assessment/Plan 1. Malnutrition 2. Tonsil cancer, s/p radiation therapy 3. Active shingles -we will plan on proceeding with the gastrostomy tube placement today.  I have d/w Dr. Laurence Ferrari who felt as if there were no vesicles in his abdominal area that we would be good to proceed. -labs are pending, but his vitals signs are normal.  He has been NPO and he does not take any blood thinners. -I will hold off on a Rx for narcotics post procedure as he already has plenty of narcotics at home.  D/w the patient and his wife -Risks and Benefits discussed with the patient including, but not limited to the need for a barium enema during the procedure, bleeding, infection, peritonitis, or damage to adjacent structures. All of the patient's questions were answered, patient is agreeable to proceed. Consent signed and in chart.   Thank you for this interesting consult.  I greatly enjoyed meeting Emmit Altland and look forward to participating in their care.  A copy of this report was sent to the requesting provider on this date.  Electronically Signed: Henreitta Cea 04/25/2015, 12:18 PM   I spent a total of  40 Minutes  in face to face in clinical consultation, greater than 50% of which was counseling/coordinating care for malnutrition, tonsil cancer, needs g-tube

## 2015-04-25 NOTE — Sedation Documentation (Signed)
Patient denies pain and is resting comfortably.  

## 2015-04-25 NOTE — Discharge Instructions (Signed)
Gastrostomy Tube Home Guide, Adult °A gastrostomy tube is a tube that is surgically placed into the stomach. It is also called a "G-tube." G-tubes are used when a person is unable to eat and drink enough on their own to stay healthy. The tube is inserted into the stomach through a small cut (incision) in the skin. This tube is used for: °· Feeding. °· Giving medication. °GASTROSTOMY TUBE CARE °· Wash your hands with soap and water. °· Remove the old dressing (if any). Some styles of G-tubes may need a dressing inserted between the skin and the G-tube. Other types of G-tubes do not require a dressing. Ask your health care provider if a dressing is needed. °· Check the area where the tube enters the skin (insertion site) for redness, swelling, or pus-like (purulent) drainage. A small amount of clear or tan liquid drainage is normal. Check to make sure scar tissue (skin) is not growing around the insertion site. This could have a raised, bumpy appearance. °· A cotton swab can be used to clean the skin around the tube: °¨ When the G-tube is first put in, a normal saline solution or water can be used to clean the skin. °¨ Mild soap and warm water can be used when the skin around the G-tube site has healed. °¨ Roll the cotton swab around the G-tube insertion site to remove any drainage or crusting at the insertion site. °STOMACH RESIDUALS °Feeding tube residuals are the amount of liquids that are in the stomach at any given time. Residuals may be checked before giving feedings, medications, or as instructed by your health care provider. °· Ask your health care provider if there are instances when you would not start tube feedings depending on the amount or type of contents withdrawn from the stomach. °· Check residuals by attaching a syringe to the G-tube and pulling back on the syringe plunger. Note the amount, and return the residual back into the stomach. °FLUSHING THE G-TUBE °· The G-tube should be periodically  flushed with clean warm water to keep it from clogging. °¨ Flush the G-tube after feedings or medications. Draw up 30 mL of warm water in a syringe. Connect the syringe to the G-tube and slowly push the water into the tube. °¨ Do not push feedings, medications, or flushes rapidly. Flush the G-tube gently and slowly. °¨ Only use syringes made for G-tubes to flush medications or feedings. °¨ Your health care provider may want the G-tube flushed more often or with more water. If this is the case, follow your health care provider's instructions. °FEEDINGS °Your health care provider will determine whether feedings are given as a bolus (a certain amount given at one time and at scheduled times) or whether feedings will be given continuously on a feeding pump.  °· Formulas should be given at room temperature. °· If feedings are continuous, no more than 4 hours worth of feedings should be placed in the feeding bag. This helps prevent spoilage or accidental excess infusion. °· Cover and place unused formula in the refrigerator. °· If feedings are continuous, stop the feedings when medications or flushes are given. Be sure to restart the feedings. °· Feeding bags and syringes should be replaced as instructed by your health care provider. °GIVING MEDICATION  °· In general, it is best if all medications are in a liquid form for G-tube administration. Liquid medications are less likely to clog the G-tube. °¨ Mix the liquid medication with 30 mL (or amount recommended by   your health care provider) of warm water. °¨ Draw up the medication into the syringe. °¨ Attach the syringe to the G-tube and slowly push the mixture into the G-tube. °¨ After giving the medication, draw up 30 mL of warm water in the syringe and slowly flush the G-tube. °· For pills or capsules, check with your health care provider first before crushing medications. Some pills are not effective if they are crushed. Some capsules are sustained-release  medications. °¨ If appropriate, crush the pill or capsule and mix with 30 mL of warm water. Using the syringe, slowly push the medication through the tube, then flush the tube with another 30 mL of tap water. °G-TUBE PROBLEMS °G-tube was pulled out. °· Cause: May have been pulled out accidentally. °· Solutions: Cover the opening with clean dressing and tape. Call your health care provider right away. The G-tube should be put in as soon as possible (within 4 hours) so the G-tube opening (tract) does not close. The G-tube needs to be put in at a health care setting. An X-ray needs to be done to confirm placement before the G-tube can be used again. °Redness, irritation, soreness, or foul odor around the gastrostomy site. °· Cause: May be caused by leakage or infection. °· Solutions: Call your health care provider right away. °Large amount of leakage of fluid or mucus-like liquid present (a large amount means it soaks clothing). °· Cause: Many reasons could cause the G-tube to leak. °· Solutions: Call your health care provider to discuss the amount of leakage. °Skin or scar tissue appears to be growing where tube enters skin.  °· Cause: Tissue growth may develop around the insertion site if the G-tube is moved or pulled on excessively. °· Solutions: Secure tube with tape so that excess movement does not occur. Call your health care provider. °G-tube is clogged. °· Cause: Thick formula or medication. °· Solutions: Try to slowly push warm water into the tube with a large syringe. Never try to push any object into the tube to unclog it. Do not force fluid into the G-tube. If you are unable to unclog the tube, call your health care provider right away. °TIPS °· Head of bed (HOB) position refers to the upright position of a person's upper body. °¨ When giving medications or a feeding bolus, keep the HOB up as told by your health care provider. Do this during the feeding and for 1 hour after the feeding or medication  administration. °¨ If continuous feedings are being given, it is best to keep the HOB up as told by your health care provider. When ADLs (activities of daily living) are performed and the HOB needs to be flat, be sure to turn the feeding pump off. Restart the feeding pump when the HOB is returned to the recommended height. °· Do not pull or put tension on the tube. °· To prevent fluid backflow, kink the G-tube before removing the cap or disconnecting a syringe. °· Check the G-tube length every day. Measure from the insertion site to the end of the G-tube. If the length is longer than previous measurements, the tube may be coming out. Call your health care provider if you notice increasing G-tube length. °· Oral care, such as brushing teeth, must be continued. °· You may need to remove excess air (vent) from the G-tube. Your health care provider will tell you if this is needed. °· Always call your health care provider if you have questions or problems with the   G-tube. SEEK IMMEDIATE MEDICAL CARE IF:   You have severe abdominal pain, tenderness, or abdominal bloating (distension).  You have nausea or vomiting.  You are constipated or have problems moving your bowels.  The G-tube insertion site is red, swollen, has a foul smell, or has yellow or brown drainage.  You have difficulty breathing or shortness of breath.  You have a fever.  You have a large amount of feeding tube residuals.  The G-tube is clogged and cannot be flushed. MAKE SURE YOU:   Understand these instructions.  Will watch your condition.  Will get help right away if you are not doing well or get worse.   This information is not intended to replace advice given to you by your health care provider. Make sure you discuss any questions you have with your health care provider.   Document Released: 04/27/2001 Document Revised: 07/03/2014 Document Reviewed: 10/24/2012 Elsevier Interactive Patient Education 2016 [place] of a Feeding Tube People who have trouble swallowing or cannot take food or medicine by mouth are sometimes given feeding tubes. A feeding tube can go into the nose and down to the stomach or through the skin in the abdomen and into the stomach or small bowel. Some of the names of these feeding tubes are gastrostomy tubes, PEG lines, nasogastric tubes, and gastrojejunostomy tubes.  SUPPLIES NEEDED TO CARE FOR THE TUBE SITE  Clean gloves.  Clean wash cloth, gauze pads, or soft paper towel.  Cotton swabs.  Skin barrier ointment or cream.  Soap and water.  Pre-cut foam pads or gauze (that go around the tube).  Tube tape. TUBE SITE CARE  Have all supplies ready and available.  Wash hands well.  Put on clean gloves.  Remove the soiled foam pad or gauze, if present, that is found under the tube stabilizer. Change the foam pad or gauze daily or when soiled or moist.  Check the skin around the tube site for redness, rash, swelling, drainage, or extra tissue growth. If you notice any of these, call your caregiver.  Moisten gauze and cotton swabs with water and soap.  Wipe the area closest to the tube (right near the stoma) with cotton swabs. Wipe the surrounding skin with moistened gauze. Rinse with water.  Dry the skin and stoma site with a dry gauze pad or soft paper towel. Do not use antibiotic ointments at the tube site.  If the skin is red, apply a skin barrier cream or ointment (such as petroleum jelly) in a circular motion, using a cotton swab. The cream or ointment will provide a moisture barrier for the skin and helps with wound healing.  Apply a new pre-cut foam pad or gauze around the tube. Secure it with tape around the edges. If no drainage is present, foam pads or gauze may be left off.  Use tape or an anchoring device to fasten the feeding tube to the skin for comfort or as directed. Rotate where you tape the tube to avoid skin damage from the  adhesive.  Position the person in a semi-upright position (30-45 degree angle).  Throw away used supplies.  Remove gloves.  Wash hands. SUPPLIES NEEDED TO FLUSH A FEEDING TUBE  Clean gloves.  60 mL syringe (that connects to the feeding tube).  Towel.  Water. FLUSHING A FEEDING TUBE   Have all supplies ready and available.  Wash hands well.  Put on clean gloves.  Draw up 30 mL of water in the syringe.  Kink the feeding tube while disconnecting it from the feeding-bag tubing or while removing the plug at the end of the tube. Kinking closes the tube and prevents secretions in the tube from spilling out.  Insert the tip of the syringe into the end of the feeding tube. Release the kink. Slowly inject the water.  If unable to inject the water, the person with the feeding tube should lay on his or her left side. The tip of the tube may be against the stomach wall, blocking fluid flow. Changing positions may move the tip away from the stomach wall. After repositioning, try injecting the water again.  After injecting the water, remove the syringe.  Always flush before giving the first medicine, between medicines, and after the final medicine before starting a feeding. This prevents medicines from clogging the tube.  Throw away used supplies.  Remove gloves.  Wash hands.   This information is not intended to replace advice given to you by your health care provider. Make sure you discuss any questions you have with your health care provider.   Document Released: 02/16/2005 Document Revised: 02/03/2012 Document Reviewed: 10/01/2011 Elsevier Interactive Patient Education 2016 [employer] Moderate Conscious Sedation, Adult, Care After Refer to this sheet in the next few weeks. These instructions provide you with information on caring for yourself after your procedure. Your health care provider may also give you more specific instructions. Your treatment has been planned  according to current medical practices, but problems sometimes occur. Call your health care provider if you have any problems or questions after your procedure. WHAT TO EXPECT AFTER THE PROCEDURE  After your procedure:  You may feel sleepy, clumsy, and have poor balance for several hours.  Vomiting may occur if you eat too soon after the procedure. HOME CARE INSTRUCTIONS  Do not participate in any activities where you could become injured for at least 24 hours. Do not:  Drive.  Swim.  Ride a bicycle.  Operate heavy machinery.  Cook.  Use power tools.  Climb ladders.  Work from a high place.  Do not make important decisions or sign legal documents until you are improved.  If you vomit, drink water, juice, or soup when you can drink without vomiting. Make sure you have little or no nausea before eating solid foods.  Only take over-the-counter or prescription medicines for pain, discomfort, or fever as directed by your health care provider.  Make sure you and your family fully understand everything about the medicines given to you, including what side effects may occur.  You should not drink alcohol, take sleeping pills, or take medicines that cause drowsiness for at least 24 hours.  If you smoke, do not smoke without supervision.  If you are feeling better, you may resume normal activities 24 hours after you were sedated.  Keep all appointments with your health care provider. SEEK MEDICAL CARE IF:  Your skin is pale or bluish in color.  You continue to feel nauseous or vomit.  Your pain is getting worse and is not helped by medicine.  You have bleeding or swelling.  You are still sleepy or feeling clumsy after 24 hours. SEEK IMMEDIATE MEDICAL CARE IF:  You develop a rash.  You have difficulty breathing.  You develop any type of allergic problem.  You have a fever. MAKE SURE YOU:  Understand these instructions.  Will watch your condition.  Will get  help right away if you are not doing well or get worse.  This information is not intended to replace advice given to you by your health care provider. Make sure you discuss any questions you have with your health care provider.   Document Released: 12/07/2012 Document Revised: 03/09/2014 Document Reviewed: 12/07/2012 Elsevier Interactive Patient Education [employer].

## 2015-04-25 NOTE — Procedures (Signed)
Interventional Radiology Procedure Note  Procedure: Placement of percutaneous 84F pull-through gastrostomy tube. Complications: None Recommendations: - NPO except for sips and chips remainder of today and overnight - Maintain G-tube to LWS x 3 hrs - May advance diet as tolerated and begin using tube tomorrow morning  Signed,  Criselda Peaches, MD

## 2015-04-25 NOTE — Progress Notes (Signed)
I briefly saw [name] in Short Stay s/p PEG tube placement today.  I spoke with pt and his wife regarding appts for next week for follow-up.  [facility] has already delivered much of their supplies and they already have an appt to come to the pt's home tomorrow, per pt's wife.  I encouraged [name] to take his oxycodone for pain related to PEG placement as needed.  I will see them on Monday in clinic with Barb.  I encouraged them to call me in the interim if they have questions or concerns. They expressed understanding and appreciation for my visit.   [name], NP [facility] [phone]

## 2015-04-26 ENCOUNTER — Ambulatory Visit: Payer: Self-pay | Admitting: Internal Medicine

## 2015-04-26 NOTE — Progress Notes (Signed)
  Oncology Nurse Navigator Documentation  Navigator Location: [facility] Onc (04/23/15 1937) Navigator Encounter Type: Clinic/[facility] (04/23/15 1937)             Treatment Phase: Follow-up;Survivorship (04/23/15 1937) Barriers/Navigation Needs: Education (04/23/15 1937)   Interventions: Education Method (04/23/15 1937)     Education Method: Demonstration;Teach-back (04/23/15 1937)      Met with Kevin Mcclain during his appt with Kevin Mcclain.  He was accompanied by his wife and s-in-law.  I supported discussion of his upcoming PEG placement.  Using PEG teaching device, I explained/demonstrated technique for gravity instillation of water flush.  Both Kevin Mcclain and his wife provided accurate return demonstration.  They voiced understanding that he will be receiving continuous nutritional feed initially, that bolus feeds and water flushes will be initiated at a later date.    They voiced understanding of staff availability to provide assistance and support as he moves forward with PEG placement.  Kevin Orem, RN, BSN, [facility] [phone]              Time Spent with Patient: 30 (04/23/15 1937)

## 2015-04-29 ENCOUNTER — Other Ambulatory Visit (HOSPITAL_BASED_OUTPATIENT_CLINIC_OR_DEPARTMENT_OTHER): Payer: Medicare Other

## 2015-04-29 ENCOUNTER — Encounter: Payer: Self-pay | Admitting: Adult Health

## 2015-04-29 ENCOUNTER — Telehealth: Payer: Self-pay | Admitting: *Deleted

## 2015-04-29 ENCOUNTER — Ambulatory Visit (HOSPITAL_BASED_OUTPATIENT_CLINIC_OR_DEPARTMENT_OTHER): Payer: Medicare Other | Admitting: Adult Health

## 2015-04-29 ENCOUNTER — Ambulatory Visit: Payer: Medicare Other | Admitting: Nutrition

## 2015-04-29 VITALS — BP 137/75 | HR 123 | Temp 98.6°F | Resp 16 | Wt 114.5 lb

## 2015-04-29 DIAGNOSIS — E46 Unspecified protein-calorie malnutrition: Secondary | ICD-10-CM

## 2015-04-29 DIAGNOSIS — R070 Pain in throat: Secondary | ICD-10-CM | POA: Diagnosis not present

## 2015-04-29 DIAGNOSIS — R112 Nausea with vomiting, unspecified: Secondary | ICD-10-CM

## 2015-04-29 DIAGNOSIS — B029 Zoster without complications: Secondary | ICD-10-CM

## 2015-04-29 DIAGNOSIS — C099 Malignant neoplasm of tonsil, unspecified: Secondary | ICD-10-CM

## 2015-04-29 DIAGNOSIS — I10 Essential (primary) hypertension: Secondary | ICD-10-CM

## 2015-04-29 DIAGNOSIS — R000 Tachycardia, unspecified: Secondary | ICD-10-CM

## 2015-04-29 DIAGNOSIS — E43 Unspecified severe protein-calorie malnutrition: Secondary | ICD-10-CM

## 2015-04-29 DIAGNOSIS — Z931 Gastrostomy status: Secondary | ICD-10-CM

## 2015-04-29 DIAGNOSIS — M792 Neuralgia and neuritis, unspecified: Secondary | ICD-10-CM

## 2015-04-29 LAB — MAGNESIUM: MAGNESIUM: 1.7 mg/dL (ref 1.5–2.5)

## 2015-04-29 LAB — COMPREHENSIVE METABOLIC PANEL
ALT: 33 U/L (ref 0–55)
ANION GAP: 11 meq/L (ref 3–11)
AST: 25 U/L (ref 5–34)
Albumin: 3.8 g/dL (ref 3.5–5.0)
Alkaline Phosphatase: 82 U/L (ref 40–150)
BILIRUBIN TOTAL: 1.11 mg/dL (ref 0.20–1.20)
BUN: 11.6 mg/dL (ref 7.0–26.0)
CO2: 29 meq/L (ref 22–29)
CREATININE: 0.8 mg/dL (ref 0.7–1.3)
Calcium: 10.2 mg/dL (ref 8.4–10.4)
Chloride: 98 mEq/L (ref 98–109)
EGFR: 89 mL/min/{1.73_m2} — ABNORMAL LOW (ref >90)
Glucose: 177 mg/dl — ABNORMAL HIGH (ref 70–140)
Potassium: 4.3 mEq/L (ref 3.5–5.1)
Sodium: 138 mEq/L (ref 136–145)
TOTAL PROTEIN: 7.1 g/dL (ref 6.4–8.3)

## 2015-04-29 MED ORDER — FENTANYL 50 MCG/HR TD PT72: 50.0000 ug | MEDICATED_PATCH | TRANSDERMAL | Status: DC

## 2015-04-29 MED ORDER — ONDANSETRON HCL 4 MG PO TABS
ORAL_TABLET | ORAL | Status: AC
  Filled 2015-04-29: qty 1

## 2015-04-29 MED ORDER — OXYCODONE-ACETAMINOPHEN 5-325 MG PO TABS
ORAL_TABLET | ORAL | Status: AC
  Filled 2015-04-29: qty 2

## 2015-04-29 MED ORDER — ONDANSETRON HCL 8 MG PO TABS
4.0000 mg | ORAL_TABLET | Freq: Once | ORAL | Status: AC
  Administered 2015-04-29: 4 mg via ORAL

## 2015-04-29 MED ORDER — OXYCODONE-ACETAMINOPHEN 5-325 MG PO TABS
2.0000 | ORAL_TABLET | Freq: Once | ORAL | Status: AC
Start: 2015-04-29 — End: 2015-04-29
  Administered 2015-04-29: 2 via ORAL

## 2015-04-29 MED ORDER — OXYCODONE HCL 5 MG/5ML PO SOLN: 10.0000 mg | ORAL | Status: DC | PRN

## 2015-04-29 MED FILL — fentaNYL 50 MCG/HR PT72: 50 | 15 days supply | Qty: 5 | Fill #0

## 2015-04-29 NOTE — Progress Notes (Signed)
CLINIC:  Survivorship  REASON FOR VISIT:  Routine follow-up for head & neck cancer & to address post-treatment acute survivorship needs.   BRIEF ONCOLOGIC HISTORY:  Oncology History   Tonsil cancer   Staging form: Pharynx - Oropharynx, AJCC 7th Edition     Clinical: Stage II (T2, N0, M0) - Signed by Eppie Gibson, MD on 10/31/2014       Tonsil cancer ([place])   12/04/2013 Miscellaneous TSH: 2.65; no h/o hypothyroidism.   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    11/06/2014 Imaging CT neck: Right palatine tonsil mass measuring 2.1 x 2.1 x 2.5 cm.  Asymmetric right LNs in level 1B, 2, and level 3.    12/03/2014 - 01/18/2015 Radiation Therapy Right tonsil and bilateral neck / 70 Gy in 35 fractions to gross disease, 63 Gy in 35 fractions to high risk nodal echelons, and 56 Gy in 35 fractions to intermediate risk nodal echelons   01/06/2015 Miscellaneous ER VISIT for dysphagia.  No hospital admission. CT neck done and pt was able to tolerate po liquids afer IV zofran. Discharged home.    01/06/2015 Imaging CT neck: Significant interval treatment response with no residual measurable right tonsil mass. Mild mucosal thickening and hyperenhancement in bilateral oropharynx & epiglottis, likely tx effect. No cervical adenpathy.   04/25/2015 Procedure PEG placement      INTERVAL HISTORY:  Kevin Mcclain presents today for follow-up after PEG placement last week.  He tolerated the procedure well and is continuing to recover.  He has tolerated the tube feedings well, up until this morning where he has been experiencing nausea, vomiting, and acid reflux.  He reports that he has been unable to swallow the Valtrex pills and thus hasn't  been able to take the complete course (Valtrex 1g TID). His pain has been severe, particularly to his left arm where the shingles vesicles are.  He reports that he has been taking "max dose" of his Oxycodone with only minimal relief.  He reports that he has been constipated for the past few days, but did have a BM this morning after much effort.  His last BM prior to this morning was 4 days ago.  He also endorses "a lot of acid in my throat" today; he was unable to take his Protonix tablet as he was vomiting.  Prior to today, he has been tolerating his tube feedings well. He is seen today with [name], Registered Dietitian.    Pain: Left arm "shingles pain" is causing him the most discomfort right now; also with some soreness to the G-tube site. His throat still hurts, "but it's the least of my worries right now."  Pain gets as high as 9-10/10. Taking 15 mg oxycodone liquid every 4 hours around-the-clock. Still using Fentanyl patch 25 mcg.   Nutritional Status:  -Intake/Diet: Osmolite 1.5 continuous tube feedings; current rate 25 ml/hr. Water flushes as directed. -Using a feeding tube? Yes, placed 04/25/15 -Weight change: ~1 lb loss since 04/23/15  Swallowing:   Pain with swallowing   Last ENT visit: Has not seen Dr. Erik Obey since completion of radiation.   Last Dentist visit:   Needs to reschedule; has not had post-XRT dental evaluation  Last TSH:   TSH 1.878 on 03/13/15  ADDITIONAL REVIEW OF SYSTEMS:  Review of Systems  Constitutional: Positive for weight loss. Negative for fever.       -Chronic weight loss, although weight is stable today. (+) fatigue; states that he is "beginning to feel a little better with a little more strength" since starting tube feedings on Friday.   HENT: Positive for sore throat.   Respiratory: Positive for sputum production.   Gastrointestinal: Positive for heartburn, nausea, vomiting and constipation. Negative for diarrhea and blood in stool.       -reports that he  has been having a lot of acid reflux today; was unable to take his Protonix this morning d/t N&V.  -had BM this morning, but was constipated. LBM prior to today was 4 days ago.    Genitourinary: Negative for dysuria.  Skin: Positive for rash.       -Left arm with shooting pain at site of shingles outbreak  Neurological: Positive for weakness.     CURRENT MEDICATIONS:  Current Outpatient Prescriptions on File Prior to Visit  Medication Sig Dispense Refill  . lidocaine (XYLOCAINE) 2 % solution Mix 1 part 2%viscous lidocaine,1part H2O.Swish and/or swallow 3m of this mixture,33m before meals and at bedtime, up to QID 100 mL 5  . nitroGLYCERIN (NITROSTAT) 0.4 MG SL tablet Place 1 tablet (0.4 mg total) under the tongue every 5 (five) minutes as needed. 50 tablet 11  . Nutritional Supplements (FEEDING SUPPLEMENT, VITAL 1.5 CAL,) LIQD Begin Vital 1.5 at 20 cc/hr continuously on Friday, 1-24.  If tolerated, increase to 25 cc/hr day 3 and 30 cc/hr on Day 4. Flush with 120 cc free water QID plus and additional 720 cc water by mouth daily.  Please send supplies to home and instruct patient on continuous feeding pump. 1422 mL   . ondansetron (ZOFRAN) 4 MG tablet Take 0.5 tablets (2 mg total) by mouth every morning. 2nd dose if needed 30 tablet 3  . ondansetron (ZOFRAN-ODT) 8 MG disintegrating tablet Take 1 tablet (8 mg total) by mouth every 8 (eight) hours as needed for nausea or vomiting. 30 tablet 5  . OVER THE COUNTER MEDICATION Place 1 drop into both eyes 3 (three) times daily as needed (dry eyes/ irritation). Clear Cooling Eye Drops    . pantoprazole (PROTONIX) 40 MG tablet Take 1 tablet (40 mg total) by mouth daily. 30 tablet 5  . valACYclovir (VALTREX) 1000 MG tablet Take 1 tablet (1,000 mg total) by mouth 3 (three) times daily. 21 tablet 0   No current facility-administered medications on file prior to visit.    ALLERGIES:  Allergies  Allergen Reactions  . Benzonatate Swelling    "swells  my throat"  . Codeine Itching  . Sitagliptin Phosphate Itching   IV FLUID ADMINISTRATION RECORD: Date Volume & IVF  02/07/15 1L NS  02/01/15 1L NS  01/30/15 1L NS   01/29/15 1L NS  01/28/15 (IVF recommended, but pt declined)  01/25/15 1L NS + 30 meq KCl  01/23/15 1L NS + 30 meq KCl  01/22/15 1L NS + 30 meq KCl (+ IV Zofran)  01/18/15 (last day of tx) 1L NS + 30 meq KCl  01/17/15 (during tx) 1L NS + 30 meq KCl  01/16/15 (during tx) 1L NS + 30 meq KCl   01/15/15 (during tx) 1L NS   01/14/15 (during tx) 1L NS   01/12/15 (during tx) 1L NS   01/10/15 (during tx) 1L NS   01/08/15 (during tx) 1L NS  PHYSICAL EXAM:  Filed Vitals:   04/29/15 1120 04/29/15 1125  BP: 134/69 137/75  Pulse: 112 123  Temp: 98.6 F (37 C)   Resp: 16   *Orthostatic VS as above.  Weight Date  114 lb 8 oz (51.937 kg) 04/29/15  115 lb 4.8 oz (52.3 kg) 04/23/15  115 lb 12.8 oz (52.527 kg) 04/15/15  119 lb 9.6 oz (54.25 kg) 03/13/15  121 lb 8 oz (55.112 kg) 02/26/15  120 lb 6.4 oz (54.613 kg) 02/18/15  122 lb 8 oz (55.566 kg) 02/11/15  124 lb 9.6 oz (56.518 kg) 02/07/15  125 lb 9.6 oz (56.972 kg) 02/04/15  127 lb 14.4 oz (58.015 kg) 01/28/15  130 lb 6.4 oz (59.149 kg) 01/21/15  132 lb 3.2 oz (59.966 kg) 01/14/15  136 lb 12.8 oz (62.052 kg) 01/07/15  142 lb 12.8 oz (64.774 kg) 12/24/14  154 lb 9.6 oz (70.126 kg) Pre-treatment: 10/31/14          General: Thin male in mild distress due to nausea & vomiting.  Accompanied by his wife today.  HEENT: Head is atraumatic and normocephalic.  Pupils equal and reactive to light. Conjunctivae clear without exudate.  Sclerae anicteric. Lymph: No cervical, supraclavicular, or infraclavicular lymphadenopathy.  Neck: No palpable masses. (+) anterior neck mild lymphedema and mild fibrosis.  Skin on neck is dry & slightly hyperpigmented, but intact.   Cardiovascular: Tachycardic, but regular  rhythm. Respiratory: Clear to auscultation bilaterally. Chest expansion symmetric without accessory muscle use. Breathing non-labored. GI: Abdomen soft and flat. No tenderness to palpation. Bowel sounds normoactive.  GU: Deferred.   Neuro: No focal deficits. Steady gait.   Psych: Normal mood and affect for situation.  Skin: Vesicular rash in linear pattern to ventral surface of left wrist/forearm, crusting. Vesicles to upper lateral arm in various stages of healing. Crusted patch of vesicles to left upper back, trapezius area.   LABORATORY DATA:  CMP Latest Ref Rng 04/29/2015 04/23/2015 04/19/2015  Glucose 70 - 140 mg/dl 177(H) 129 142(H)  BUN 7.0 - 26.0 mg/dL 11.6 11.5 9  Creatinine 0.7 - 1.3 mg/dL 0.8 0.8 0.78  Sodium 136 - 145 mEq/L 138 137 138  Potassium 3.5 - 5.1 mEq/L 4.3 4.1 4.8  Chloride 96 - 112 mEq/L - - 99  CO2 22 - 29 mEq/L 29 27 33(H)  Calcium 8.4 - 10.4 mg/dL 10.2 9.6 10.0  Total Protein 6.4 - 8.3 g/dL 7.1 6.4 -  Total Bilirubin 0.20 - 1.20 mg/dL 1.11 1.17 -  Alkaline Phos 40 - 150 U/L 82 65 -  AST 5 - 34 U/L 25 23 -  ALT 0 - 55 U/L 33 31 -   CBC CBC Latest Ref Rng 04/25/2015 04/23/2015 03/13/2015  WBC 4.0 - 10.5 K/uL 6.4 5.6 5.8  Hemoglobin 13.0 - 17.0 g/dL 13.0 13.3 13.1  Hematocrit 39.0 - 52.0 % 37.9(L) 37.6(L) 39.5  Platelets 150 - 400 K/uL 152 163 201     Ref. Range 04/23/2015 11:02  Magnesium Latest Ref Range: 1.5-2.5 mg/dl 1.8    Ref. Range 04/29/2015 11:03  Phosphorus Latest Ref Range: 2.5-4.5 mg/dL 2.9      DIAGNOSTIC IMAGING:  None at this visit.    ASSESSMENT & PLAN:  Kevin Mcclain is a pleasant 70 y.o. male with history of cancer of the right tonsil, treated with IMRT alone; completed treatment on 01/18/15. Patient presents to survivorship clinic today for routine follow-up and to address acute survivorship needs.  1. Cancer of the right tonsil:  Kevin Mcclain continues to recover slowly from definitive treatment with radiation therapy to his  head and neck.  After discussing with Dr. Isidore Moos, we have pushed his restaging PET scan out about 2 weeks to allow the patient more time to recover.  The patient and his wife are agreeable to this plan.    2. Nausea and vomiting: The likely etiology for his recent episodes of N&V is stomach overfilling from continuous tube feedings and water flushes.  He vomited a small volume of bilious emesis during his visit today. I administered Zofran 4 mg via G-tube during his visit today, which offered some relief for him.  He has Zofran at home, with a regimen previously prescribed by GI, which he had been following before his N&V episode this morning. I encouraged him to restart that daily Zofran regimen (Zofran 2 mg daily, then addition 2 mg dose prn daily), as well as the Protonix (which he was unable to take today due to N&V). He is hemodynamically stable without evidence of orthostatic hypotension.  [name], RD and I gave education regarding water flushes and best practice to reduce overfilling symptoms. The patient and his wife voiced verbal understanding and clarification regarding the water flushes. We will continue to closely monitor.     3. Herpes zoster:  He continues to report unmanaged pain as a result of his recent shingles infection.  The lesions are progressing towards healing, with the vesicles on his upper back and lower forearm with crusting today.  The most painful area for him is the lateral left upper arm where there remains linear vesicles in various stages of healing. He had not been able to tolerate swallowing the total prescribed doses of the Valtrex per day because of the size of the pills and thus has not seen a resolution of his symptoms.  I re-educated Kevin Mcclain and his wife on how to crush medications (particularly the Valtrex previously prescribed) and administer it via his G-tube to help clear the herpes zoster.  I instructed him to complete the course of Valtrex as prescribed.  Also, since he had vomited his pain medication before leaving home today and reported increased pain during his visit, I also ordered and administered 2 tabs Percocet 5/325 via his G-tube, followed by small volume water flush.  I monitored him for about 20 minutes after he received both the Zofran and Percocet via tube and he tolerated both without complaints.   4. Severe protein-calorie malnutrition:  Kevin Mcclain is tolerating his tube feedings relatively well.  The episodes of nausea and vomiting today he attributes to his water flushes (increased volume too quickly in his stomach) and I agree.  [name], RD met with the patient today as well (see her note for additional documentation).  The patient was given instructions from [facility] to increase his tube feeding rate of the Osmolite 1.5 to 30 ml/hr today (Monday).  If he is feeling well on Wednesday without nausea or vomiting, then he can increase the rate again to 40 ml/hr.  Barb let him know that if he begins to experience N&V with increasing the rate to 40 ml/hr, then he can decrease rate back to 30 ml/hr and we will reassess at our follow-up appt on Friday.  These instructions were given to the patient in writing in his After-Visit Summary.  5. At risk for refeeding syndrome: CMET, Mg, and Phos remain within normal limits today, which is encouraging.  I will see the patient again on  Friday with labs and visit with [name], RD to continue to monitor tolerance to increased tube feeding rate.  We will recheck CMET, Mg, and Phos at that time.  I will replete electrolytes as needed.  6. Tachycardia: He was tachycardic with HR in the 110s while seated and 120s when standing today. He is not orthostatic.  He reports that he has not been taking his Toprol (previously prescribed for HTN by his PCP) because his blood pressures and pulse had been well-controlled without the medication since his cancer diagnosis and subsequent treatment.  His previous heart  rate vital sign readings in recent weeks in visits with me have been normal.  His tachycardia today could have been related to his N&V and pain.  I will continue to monitor this and consider speaking with his PCP about restarting the Toprol for more adequate hypertension and tachycardia control, if appropriate.  For now, we will continue to hold it.  I will see the patient again this Friday for further evaluation and will reassess at that time.   7. Throat pain/Neuralgia secondary to shingles infection: I refilled his oxycodone today. (Oxycodone 27m/5ml; Take 10-15 ml (10-15 mg) every 4 hours as needed for pain, #1000 ml; no refills).  He had previously been tolerating the 50 mcg Fentanyl patch towards the end of his treatment and we reduced the dose to try to alleviate potential opiate-related N&V.  However, his pain is inadequately controlled and needs additional long-acting/basal coverage.  Therefore, I have increased his Fentanyl patch back to 50 mcg today (Fentanyl patch 50 mcg transdermal Q72H, #5, no refills).While, I am not sure he is getting maximum benefit from the patch given his weight loss and limited adipose tissue, I did increase the patch given his use of max dose breakthrough pain medications and his reduced ability to swallow pills with his sore throat.  As stated above, he did receive 2 tabs Percocet 5/325 mg in clinic today via his G-tube, which he tolerated well.   8. Bowel regimen/Constipation prevention: Encouraged him to continue taking the Miralax as prescribed/recommended by GI. My goal for him is to have a BM every day or every other day, particularly while taking opiates for pain control.  I reiterated the importance of maintaining an adequate bowel regimen, particularly in light of increasing his opiate use.  He understands and agrees with this plan.       Dispo:  -RTC to see myself and BDory Peru RD on Friday, 05/03/15 with labs before visit.  We will re-assess his tolerance to  feedings/water flushes at that time. The patient and his wife know to call me with any other questions before then.  -PET scan and visit with Dr. SIsidore Mooshave been rescheduled to 05/16/15 (PET) and 05/17/15 (visit with Dr. SIsidore Moos.      A total of 35 minutes was spent in face-to-face care for this patient, with greater than 50% of that time in counseling and care-coordination.    GMike Craze NP [phone]

## 2015-04-29 NOTE — Telephone Encounter (Signed)
CALLED PATIENT TO INFORM OF LAB AND PET BEING MOVED TO 05-16-15 AND HIS FU WITH DR. Isidore Moos ON 05-17-15 @ 11 AM WITH DR. Isidore Moos, SPOKE WITH PATIENT'S WIFE PATRICIA AND SHE IS AWARE OF THESE APPTS.

## 2015-04-29 NOTE — Progress Notes (Signed)
Nutrition follow-up completed with patient and wife. Patient is status post treatment for tonsil cancer and has recently been diagnosed with shingles Patient is status post PEG feeding tube,on Thursday, February 23. Current weight documented as 114.5 pounds on February 27. Labs noted on February 21 before tube feeding initiation were as follows: Phosphorus 3.2, prealbumin 22, potassium 4.1, glucose 129, and albumin 3.8. Labs today include magnesium 1.7 and Glucose 177. Other labs pending. Patient has had several episodes of nausea and vomiting. Reports constipation but is taking miralax. Reports increased pain from shingles. Reports tolerating Osmolite 1.5 at 25 mL an hour over 24 hours daily. Tube is being flush with 30-60 cc of free water.  Approximately 3 times daily. Patient states he is drinking 16 ounces of water by mouth. He occasionally tolerates Ensure Plus with ice cream.    Estimated nutrition needs: 1775-2000 calories, 72-85 grams protein, greater than 2 L fluid.  Nutrition diagnosis: Unintended weight loss continues.  Intervention: Recommended patient increase Osmolite 1.5-30 cc an hour over 24 hours as tolerated. If patient is tolerating well recommended increased to 40 cc an hour on Wednesday, March 1. Recommended 120 cc free water 4 times a day plus a minimum of 720 cc free water by mouth or through tube as tolerated. Will review labs and make tube feeding changes as needed. Questions were answered.  Teach back method was used.  Monitoring, evaluation, goals:  Patient is tolerating continuous tube feedings at 25 cc an hour. Advance tube feedings as tolerated to eventual goal 55 cc an hour. Monitor labs and adjust feedings as needed.  Next visit: Friday, March 3, after survivorship visit.  **Disclaimer: This note was dictated with voice recognition software. Similar sounding words can inadvertently be transcribed and this note may contain transcription errors which may  not have been corrected upon publication of note.**

## 2015-04-29 NOTE — Patient Instructions (Addendum)
-  Increase tube feeding rate to 30 ml/hr.  -If you feel good without nausea or vomiting on Wednesday, then increase tube feedings to 40 ml/hr.  -If you have more nausea or vomiting with increasing the rate to 40 ml/hr, then it is okay to decrease back down to 30 ml/hr.  -Start your new Fentanyl 50 mcg patch. We'll recheck your pain on Friday.    Water flushes: -GOAL: 120 ml free water 4 times per day and 720 ml by mouth.  -Remember to count your water flushes when you give medications.   Call me if you need me!  [phone]  I'll see you on Friday! Elzie Rings

## 2015-04-30 LAB — PHOSPHORUS: Phosphorus, Ser: 2.9 mg/dL (ref 2.5–4.5)

## 2015-05-02 ENCOUNTER — Ambulatory Visit: Payer: Medicare Other

## 2015-05-02 ENCOUNTER — Other Ambulatory Visit (HOSPITAL_COMMUNITY): Payer: Self-pay

## 2015-05-02 ENCOUNTER — Ambulatory Visit: Admission: RE | Admit: 2015-05-02 | Payer: Medicare Other | Source: Ambulatory Visit

## 2015-05-02 MED FILL — oxyCODONE HCL 5 MG/5ML SOLN: 5 | 11 days supply | Qty: 1000 | Fill #0

## 2015-05-03 ENCOUNTER — Ambulatory Visit: Payer: Medicare Other | Admitting: Nutrition

## 2015-05-03 ENCOUNTER — Ambulatory Visit: Payer: Medicare Other | Admitting: Radiation Oncology

## 2015-05-03 ENCOUNTER — Other Ambulatory Visit (HOSPITAL_BASED_OUTPATIENT_CLINIC_OR_DEPARTMENT_OTHER): Payer: Medicare Other

## 2015-05-03 ENCOUNTER — Ambulatory Visit (HOSPITAL_BASED_OUTPATIENT_CLINIC_OR_DEPARTMENT_OTHER): Payer: Medicare Other | Admitting: Adult Health

## 2015-05-03 ENCOUNTER — Other Ambulatory Visit: Payer: Self-pay

## 2015-05-03 ENCOUNTER — Encounter: Payer: Self-pay | Admitting: Adult Health

## 2015-05-03 VITALS — BP 151/60 | HR 74 | Temp 98.1°F | Resp 16 | Wt 115.2 lb

## 2015-05-03 DIAGNOSIS — E46 Unspecified protein-calorie malnutrition: Secondary | ICD-10-CM

## 2015-05-03 DIAGNOSIS — R252 Cramp and spasm: Secondary | ICD-10-CM

## 2015-05-03 DIAGNOSIS — C099 Malignant neoplasm of tonsil, unspecified: Secondary | ICD-10-CM

## 2015-05-03 DIAGNOSIS — Z931 Gastrostomy status: Secondary | ICD-10-CM

## 2015-05-03 DIAGNOSIS — R110 Nausea: Secondary | ICD-10-CM

## 2015-05-03 DIAGNOSIS — R070 Pain in throat: Secondary | ICD-10-CM

## 2015-05-03 DIAGNOSIS — K117 Disturbances of salivary secretion: Secondary | ICD-10-CM

## 2015-05-03 DIAGNOSIS — M792 Neuralgia and neuritis, unspecified: Secondary | ICD-10-CM | POA: Diagnosis not present

## 2015-05-03 LAB — COMPREHENSIVE METABOLIC PANEL
ALBUMIN: 3.9 g/dL (ref 3.5–5.0)
ALK PHOS: 92 U/L (ref 40–150)
ALT: 51 U/L (ref 0–55)
ANION GAP: 10 meq/L (ref 3–11)
AST: 31 U/L (ref 5–34)
BUN: 13.8 mg/dL (ref 7.0–26.0)
CALCIUM: 9.9 mg/dL (ref 8.4–10.4)
CO2: 30 meq/L — AB (ref 22–29)
Chloride: 99 mEq/L (ref 98–109)
Creatinine: 0.8 mg/dL (ref 0.7–1.3)
EGFR: 90 mL/min/{1.73_m2} — AB (ref >90)
Glucose: 127 mg/dl (ref 70–140)
Potassium: 4.4 mEq/L (ref 3.5–5.1)
Sodium: 139 mEq/L (ref 136–145)
TOTAL PROTEIN: 7 g/dL (ref 6.4–8.3)
Total Bilirubin: 0.56 mg/dL (ref 0.20–1.20)

## 2015-05-03 LAB — MAGNESIUM: Magnesium: 2.1 mg/dl (ref 1.5–2.5)

## 2015-05-03 NOTE — Progress Notes (Signed)
Nutrition follow up completed with patient and wife. Patient s/p treatment for tonsil cancer. Reports tolerating PEG feedings using Osmolite 1.5 at 45 cc/hr for approximately 16 hours daily; usually from 5 pm until 8 am. Weight improved and documented as 115.2 pounds up from 114.5 pounds February 27. Reports daily BM. Pain improved. Fatigue improved. Labs reviewed and noted to be WNL: Glucose 127, Potassium 4.4 and Magnesium 2.1, Phos pending. Patient is not showing signs of refeeding syndrome.  Estimated Nutrition Needs: [id number] - 2000 kcal, 72-85 grams protein, greater than 2 L fluid daily  Nutrition Diagnosis:  Unintended weight loss improved.  Intervention: Increase Osmolite 1.5 10 cc/hr every other day until goal rate of 85 cc/hr achieved for 16 hours daily. (5.5 cans) Continue free water flushes of 120 cc 4 times daily plus a minimum of 720 cc free water by mouth. Continue increasing oral intake of soft foods and liquids as tolerated. Teach back method used.  Monitoring, Evaluation, Goals: Patient will tolerate TF advancement to goal of 5.5 cans daily to provide 1953 calories, 82 grams protein, 2196 ml free water meeting 100% estimated needs. Patient will increase soft foods.  Next Visit:  Will follow up with patient by telephone next week.

## 2015-05-03 NOTE — Patient Instructions (Signed)
*  I will call [place] about your continued calls regarding labs to try to give them clarification.  *Maintain your bowel regimen with bowel movements every day or every other day, particularly while you are on pain medicine.  *Try some Biotene gel to help with the dry mouth and keep using your humidifier, especially at night time.  *Try to take your Oxycodone when your pain level is 5-6/10, instead of when it is too severe.  This will hopefully help better control your pain.    Tube feeding instructions per Barb: -Keep your current rate at 45 ml/hr today. -Increase to 55 ml/hr tomorrow, Saturday.  -Increase to 65 ml/hr on Monday, 05/06/15. -Increase to 75 ml/hr on Wednesday, 05/08/15. -Increase to 85 ml/hr on Friday, 05/10/15.  This will be your GOAL RATE! :)  *Goal is 5.5 cans total per day.  *Okay to run your tube feedings about 15-16 hours per day.  *Continue your current water flushes.   I am out of the office Monday, Tuesday, and Wednesday next week.  Call Dr. Pearlie Oyster nurse, Harlow Asa, RN [phone]) with any concerns while I am out.   [name], NP [phone]

## 2015-05-03 NOTE — Progress Notes (Signed)
CLINIC:  Survivorship  REASON FOR VISIT:  Routine follow-up for head & neck cancer & to address post-treatment acute survivorship needs.   BRIEF ONCOLOGIC HISTORY:  Oncology History   Tonsil cancer   Staging form: Pharynx - Oropharynx, AJCC 7th Edition     Clinical: Stage II (T2, N0, M0) - Signed by Eppie Gibson, MD on 10/31/2014       Tonsil cancer ([place])   12/04/2013 Miscellaneous TSH: 2.65; no h/o hypothyroidism.   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    11/06/2014 Imaging CT neck: Right palatine tonsil mass measuring 2.1 x 2.1 x 2.5 cm.  Asymmetric right LNs in level 1B, 2, and level 3.    12/03/2014 - 01/18/2015 Radiation Therapy Right tonsil and bilateral neck / 70 Gy in 35 fractions to gross disease, 63 Gy in 35 fractions to high risk nodal echelons, and 56 Gy in 35 fractions to intermediate risk nodal echelons   01/06/2015 Miscellaneous ER VISIT for dysphagia.  No hospital admission. CT neck done and pt was able to tolerate po liquids afer IV zofran. Discharged home.    01/06/2015 Imaging CT neck: Significant interval treatment response with no residual measurable right tonsil mass. Mild mucosal thickening and hyperenhancement in bilateral oropharynx & epiglottis, likely tx effect. No cervical adenpathy.   04/25/2015 Procedure PEG placement      INTERVAL HISTORY:  Kevin Mcclain presents today for his 2nd follow-up this week to assess his tolerance to his tube feedings and risk for refeeding syndrome.  Overall, he reports feeling much better in recent days; he is beginning to gain more energy and feels like he is starting to heal more rapidly.  His bowels are moving regularly with the Miralax.  His pain is  much better since increasing the Fentanyl patch back to 50 mcg.  He is tolerating his tube feedings well, with the exception of 1 episode of N&V this morning, which he attributes to his stomach overfilling with water and flushes.     Pain: Much improved since increasing to 50 mcg Fentanyl patch.  Continuing to use Oxycodone for breakthrough pain, "but he waits until his pain is really bad before he takes anything" per his wife.    Nutritional Status:  -Intake/Diet: Osmolite 1.5 continuous tube feedings; he stays connected to pump for about 15-16 hours per day per his report; current rate of feeding 45 ml/hr (increased to 45 ml/hr yesterday). Water flushes as directed. He is eating small amounts of soft foods as well.  -Using a feeding tube? Yes, placed 04/25/15 -Weight change: ~1 lb GAIN since 04/29/15  Swallowing:   Pain with swallowing, improving   Last ENT visit: Has not seen Dr. Erik Obey since completion of radiation.   Last Dentist visit:   Needs to reschedule; has not had post-XRT dental evaluation  Last TSH:   TSH 1.878 on 03/13/15    ADDITIONAL REVIEW OF SYSTEMS:  Review of Systems  Constitutional: Negative for fever.       -"I'm beginning to feel better a little more every day."   HENT: Positive for sore throat.        -Sore throat pain being managed with Oxycodone and Fentanyl patch  Respiratory: Positive for sputum production.   Gastrointestinal: Positive  for nausea and vomiting. Negative for diarrhea, constipation and blood in stool.       -Heartburn is well-managed with Protonix -1 episode of N&V today, but none since last visit prior to today.  -Bowels are moving daily with Miralax. No reports of constipation   Genitourinary: Negative for dysuria.  Skin: Positive for rash.       -Shingles pain still present, but improving.      CURRENT MEDICATIONS:  Current Outpatient Prescriptions on File Prior to Visit  Medication Sig Dispense Refill  . fentaNYL (DURAGESIC - DOSED MCG/HR)  50 MCG/HR Place 1 patch (50 mcg total) onto the skin every 3 (three) days. 5 patch 0  . lidocaine (XYLOCAINE) 2 % solution Mix 1 part 2%viscous lidocaine,1part H2O.Swish and/or swallow 71ml of this mixture,93min before meals and at bedtime, up to QID 100 mL 5  . nitroGLYCERIN (NITROSTAT) 0.4 MG SL tablet Place 1 tablet (0.4 mg total) under the tongue every 5 (five) minutes as needed. 50 tablet 11  . Nutritional Supplements (FEEDING SUPPLEMENT, VITAL 1.5 CAL,) LIQD Begin Vital 1.5 at 20 cc/hr continuously on Friday, 1-24.  If tolerated, increase to 25 cc/hr day 3 and 30 cc/hr on Day 4. Flush with 120 cc free water QID plus and additional 720 cc water by mouth daily.  Please send supplies to home and instruct patient on continuous feeding pump. 1422 mL   . ondansetron (ZOFRAN) 4 MG tablet Take 0.5 tablets (2 mg total) by mouth every morning. 2nd dose if needed 30 tablet 3  . ondansetron (ZOFRAN-ODT) 8 MG disintegrating tablet Take 1 tablet (8 mg total) by mouth every 8 (eight) hours as needed for nausea or vomiting. 30 tablet 5  . OVER THE COUNTER MEDICATION Place 1 drop into both eyes 3 (three) times daily as needed (dry eyes/ irritation). Clear Cooling Eye Drops    . oxyCODONE (ROXICODONE) 5 MG/5ML solution Take 10 mLs (10 mg total) by mouth every 4 (four) hours as needed for moderate pain or severe pain. Take 10-15 mLs (10 mg-15 mg) by mouth every 4 hours as needed for pain. 1000 mL 0  . pantoprazole (PROTONIX) 40 MG tablet Take 1 tablet (40 mg total) by mouth daily. 30 tablet 5  . valACYclovir (VALTREX) 1000 MG tablet Take 1 tablet (1,000 mg total) by mouth 3 (three) times daily. 21 tablet 0   No current facility-administered medications on file prior to visit.    ALLERGIES:  Allergies  Allergen Reactions  . Benzonatate Swelling    "swells my throat"  . Codeine Itching  . Sitagliptin Phosphate Itching   IV FLUID ADMINISTRATION RECORD: Date Volume & IVF  02/07/15 1L NS  02/01/15 1L NS    01/30/15 1L NS   01/29/15 1L NS  01/28/15 (IVF recommended, but pt declined)  01/25/15 1L NS + 30 meq KCl  01/23/15 1L NS + 30 meq KCl  01/22/15 1L NS + 30 meq KCl (+ IV Zofran)  01/18/15 (last day of tx) 1L NS + 30 meq KCl  01/17/15 (during tx) 1L NS + 30 meq KCl  01/16/15 (during tx) 1L NS + 30 meq KCl   01/15/15 (during tx) 1L NS   01/14/15 (during tx) 1L NS   01/12/15 (during tx) 1L NS   01/10/15 (during tx) 1L NS   01/08/15 (during tx) 1L NS            PHYSICAL EXAM:  Filed Vitals:   05/03/15 1120  BP: 151/60  Pulse:  74  Temp: 98.1 F (36.7 C)  Resp: 16    Weight Date  115 lb 3.2 oz (52.254 kg) 05/03/15  114 lb 8 oz (51.937 kg) 04/29/15  115 lb 4.8 oz (52.3 kg) 04/23/15  115 lb 12.8 oz (52.527 kg) 04/15/15  119 lb 9.6 oz (54.25 kg) 03/13/15  121 lb 8 oz (55.112 kg) 02/26/15  120 lb 6.4 oz (54.613 kg) 02/18/15  122 lb 8 oz (55.566 kg) 02/11/15  124 lb 9.6 oz (56.518 kg) 02/07/15  125 lb 9.6 oz (56.972 kg) 02/04/15  127 lb 14.4 oz (58.015 kg) 01/28/15  130 lb 6.4 oz (59.149 kg) 01/21/15  132 lb 3.2 oz (59.966 kg) 01/14/15  136 lb 12.8 oz (62.052 kg) 01/07/15  142 lb 12.8 oz (64.774 kg) 12/24/14  154 lb 9.6 oz (70.126 kg) Pre-treatment: 10/31/14          General: Thin male in no distress.  Accompanied by his wife today. [name], RD also present during today's visit. HEENT: Head is atraumatic and normocephalic.  Pupils equal and reactive to light. Conjunctivae clear without exudate.  Sclerae anicteric. Mucositis is largely resolved with the exception of a very small area in the posterior oropharynx, which is progressing towards healing as well.  Mucous membranes are dry. Tongue is mildly dry.  Overall, oral exam is much improved from previous. (+) trismus.  Lymph: No cervical, supraclavicular, or infraclavicular lymphadenopathy.  Neck: No palpable masses. (+) anterior neck mild lymphedema and mild fibrosis.  Skin  on neck is dry & slightly hyperpigmented, but intact.   Cardiovascular: Normal rate and regular rhythm. Respiratory: Clear to auscultation bilaterally. Chest expansion symmetric without accessory muscle use. Breathing non-labored. GI: Abdomen soft and flat. No tenderness to palpation. Bowel sounds normoactive. G-tube site with old, dried blood around the bumper. There is also a very small area (about 1 cm) of redness to the skin in the right upper quadrant of the G-tube bumper; there is no induration, odor, or red streaking on the skin at the insertion site.  GU: Deferred.   Neuro: No focal deficits. Steady gait.   Psych: Normal mood and affect for situation.  Skin: Vesicular rash in linear pattern to ventral surface of left wrist/forearm, crusting. Vesicles to upper lateral arm crusting. Crusted patch of vesicles to left upper back, trapezius area. No new lesions noted.   LABORATORY DATA:  CMP Latest Ref Rng 05/03/2015 04/29/2015 04/23/2015  Glucose 70 - 140 mg/dl 127 177(H) 129  BUN 7.0 - 26.0 mg/dL 13.8 11.6 11.5  Creatinine 0.7 - 1.3 mg/dL 0.8 0.8 0.8  Sodium 136 - 145 mEq/L 139 138 137  Potassium 3.5 - 5.1 mEq/L 4.4 4.3 4.1  Chloride 96 - 112 mEq/L - - -  CO2 22 - 29 mEq/L 30(H) 29 27  Calcium 8.4 - 10.4 mg/dL 9.9 10.2 9.6  Total Protein 6.4 - 8.3 g/dL 7.0 7.1 6.4  Total Bilirubin 0.20 - 1.20 mg/dL 0.56 1.11 1.17  Alkaline Phos 40 - 150 U/L 92 82 65  AST 5 - 34 U/L 31 25 23   ALT 0 - 55 U/L 51 33 31   CBC CBC Latest Ref Rng 04/25/2015 04/23/2015 03/13/2015  WBC 4.0 - 10.5 K/uL 6.4 5.6 5.8  Hemoglobin 13.0 - 17.0 g/dL 13.0 13.3 13.1  Hematocrit 39.0 - 52.0 % 37.9(L) 37.6(L) 39.5  Platelets 150 - 400 K/uL 152 163 201     Ref. Range 05/03/2015 11:10  Magnesium Latest Ref Range: 1.5-2.5 mg/dl 2.1   *  Phosphorus pending.       DIAGNOSTIC IMAGING:  None at this visit.    ASSESSMENT & PLAN:  Kevin Mcclain is a pleasant 70 y.o. male with history of cancer of the right tonsil,  treated with IMRT alone; completed treatment on 01/18/15. Patient presents to survivorship clinic today for routine follow-up and to address acute survivorship needs.  1. Cancer of the right tonsil:  Kevin Mcclain continues to recover slowly from definitive treatment with radiation therapy to his head and neck.  After discussing with Dr. Isidore Moos, we have pushed his restaging PET scan out about 2 weeks to allow the patient more time to recover; rescheduled to 05/16/15.  The patient and his wife are agreeable to this plan.    2. Nausea and vomiting, improving: He did report an episode of N&V this morning prior to coming to clinic, which he and his wife attribute to a potentially too large of a volume of water flush before leaving home. He also had the same type of episode on Monday before coming to see me in clinic then as well.  He had no nausea or vomiting on Tuesday, Wednesday, or Thursday this week.  He states that he has not needed any of the Zofran during those days as well.  Ms. Cioffi has been a wonderful caregiver for her husband and was worried about removing all particles in the G-tube to prevent it from getting clogged.  In doing so, Kevin Mcclain stomach was likely getting over-filled with water.  I provided reassurance to Ms. Peaden that small remains of tube feed formula or stomach contents in the tubing were completely normal and safe to leave there.  I reinforced the importance of water flushes after medication administration in the tube, as this is where the increased risk of the tube clogging would be.  She stated that she felt much better hearing this and would work on decreasing the amount of water she used at a time (while still maintaining water flush totals for the day as recommended by [facility]).  I encouraged Kevin Mcclain to continue to use the Zofran as needed for N&V.      3. Protein-calorie malnutrition, improving:  Kevin Mcclain has gained 1 pound since his last  visit earlier this week, which is very encouraging.  [name], RD was present during much of the patient's visit today and gave the following recommendations for his tube feeding rate titration. (see below).  He was encouraged to continue his water flushes as previously prescribed/directed.  We also encouraged him to continue to eat what he is able to tolerate by mouth as well, which he is doing a good job of trying.  He will continue to administer the continuous tube feedings for about 15-16 hours/day, with a goal of 5.5 cans total per day. Raford Pitcher also gave patient and his wife instructions on how to request additional formula, when needed.    Tube feeding instructions per Barb: -Keep your current rate at 45 ml/hr today. -Increase to 55 ml/hr tomorrow, Saturday.  -Increase to 65 ml/hr on Monday, 05/06/15. -Increase to 75 ml/hr on Wednesday, 05/08/15. -Increase to 85 ml/hr on Friday, 05/10/15. This will be goal rate.    4. At risk for refeeding syndrome, resolved: CMET and Magnesium remain within normal limits today, which is encouraging.  The phosphorus has not resulted from the lab yet, but I have low suspicion that it will be abnormal.  His risk for refeeding syndrome at this point is pretty  low.  He will increase his tube feeding rate as stated above, under the direction of [name], RD.  He does not need any further chemistry lab draws at this time, unless he begins to exhibit new symptoms.   5. G-tube site care: While the insertion site has a little bit of redness, I do not think the site is infected.  I reinforced G-tube site care teaching with how to clean and maintain the G-tube insertion site.  I encouraged Ms. Stansbery to use a Q-tip with saline solution and clean in and around the bumpers close to his skin on the G-tube. She has been doing a great job of helping take care of the tube, but was previously nervous that cleaning underneath the bumpers would cause the tube to fall out.  I provided  reassurance that this would be extremely unlikely and she said that she would now feel more comfortable cleaning the site at home, as needed.    6. Xerostomia: I encouraged Kevin Mcclain to continue to use the Biotene spray. I also encouraged him to try the Biotene gel, as some patients find it helpful in providing temporary relief of the dry mouth.  He is already using a humidifier at night and I encouraged him to continue to use that, as it will help provide some moisture in the air for him while he sleeps.    7. Trismus: Now that his oral pain is improving, I encouraged him to work on the trismus exercises provided by Dr. Enrique Sack.  I reinforced the importance of these exercises in his continued recovery.   8. Herpes zoster, improving: Most of the lesions are crusting at this time. The patient is almost finished with his course of Valtrex, which he is appropriately crushing and administering in his G-tube as ordered.  There were no new lesions today, which is encouraging that he is continuing to heal and the virus is no longer replicating.   9. Throat pain/Neuralgia secondary to shingles infection: Pain is much better controlled since increasing the Fentanyl patch back to 50 mcg.  He continues to have some neuralgia to his left upper arm, but this is getting better per his report.  I encouraged him to begin taking the Oxycodone when his pain level is at 5-6/10, instead of waiting until the pain is severe.  This will allow the pain medication to provide him better and faster pain control.  He does not need refills on either the Fentanyl or the Oxycodone at this time.  As he continues to recover, we will work on titrating down on his opiates.  For now, we will leave his pain regimen the same until at least after his PET scan.   10. Bowel regimen/Constipation prevention: Encouraged him to continue taking the Miralax as prescribed/recommended by GI. My goal for him is to have a BM every day or every other  day, particularly while taking opiates for pain control.  I reiterated the importance of maintaining an adequate bowel regimen.  He understands and agrees with this plan.       Dispo:  -I will not plan to see the patient again before his upcoming restaging scan, as he is clinically much improved.  They know to call me with any new or concerning symptoms.  -PET scan and visit with Dr. Isidore Moos have been rescheduled to 05/16/15 (PET) and 05/17/15 (visit with Dr. Isidore Moos).  -After the PET scan and based on the patient's degree of improvement/healing at that time, we  will begin to work on titrating down on his pain medications.       A total of 20 minutes was spent in face-to-face care for this patient, with greater than 50% of that time in counseling and care-coordination.     [name], NP [place] [phone]

## 2015-05-04 LAB — PHOSPHORUS: Phosphorus, Ser: 3.4 mg/dL (ref 2.5–4.5)

## 2015-05-09 ENCOUNTER — Telehealth: Payer: Self-pay | Admitting: Adult Health

## 2015-05-09 ENCOUNTER — Other Ambulatory Visit: Payer: Self-pay | Admitting: Adult Health

## 2015-05-09 ENCOUNTER — Encounter: Payer: Medicare Other | Admitting: Nutrition

## 2015-05-09 ENCOUNTER — Telehealth: Payer: Self-pay | Admitting: Nutrition

## 2015-05-09 DIAGNOSIS — B029 Zoster without complications: Secondary | ICD-10-CM

## 2015-05-09 DIAGNOSIS — C099 Malignant neoplasm of tonsil, unspecified: Secondary | ICD-10-CM

## 2015-05-09 DIAGNOSIS — B0229 Other postherpetic nervous system involvement: Secondary | ICD-10-CM

## 2015-05-09 MED ORDER — OXYCODONE HCL 5 MG/5ML PO SOLN: 10.0000 mg | ORAL | Status: DC | PRN

## 2015-05-09 MED ORDER — GABAPENTIN 300 MG PO CAPS: 300.0000 mg | ORAL_CAPSULE | Freq: Three times a day (TID) | ORAL | Status: DC

## 2015-05-09 MED FILL — GABAPENTIN 300 MG CAPSULE: 300 | 30 days supply | Qty: 90 | Fill #0

## 2015-05-09 NOTE — Telephone Encounter (Signed)
I received a call from Kevin Mcclain and Kevin Mcclain letting me know he is having uncontrolled pain to Kevin left arm and shoulder.  He has been taking the oxycodone with very minimal relief.  He describes the pain as sharp and stabbing.  He has been requiring extra doses of oxycodone to try to control the pain, which has only been minimally effective per Kevin report.  Kevin Mcclain is asking for something stronger for pain to help with Kevin symptoms. He reports that he does not have any new vesicles; he is still completing the course of Valtrex prescribed several days ago "I just couldn't take it 3 times per day."  I reiterated the importance of taking the Valtrex 3 times per day as prescribed in order to stop the virus from continuing to replicate and help treat the Valtrex.   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  I will start him on gabapentin with an escalating dose schedule.  *Gabapentin 300 mg po once on Day 1 *Gabapentin 300 mg po BID on Day 2  *Gabapentin 300 mg po TID on Days 3 and thereafter.    I have reviewed the potential side effects of medications like Neurontin with the patient and Kevin Mcclain, particularly when combined with opiates.    I have also refilled Kevin Oxycodone solution as well; he states that he will run out on Saturday, but is concerned because the pharmacy is not open on Saturdays.  I will notify [place] to try to authorize a refill for Friday, if they are able.  The prescription is available for pick-up in the radiation nurses' station; Kevin Mcclain was made aware.   I encouraged them to call me early next week if he does not have improvement in Kevin pain control.  Kevin Mcclain voiced verbal understanding and agrees with this plan.     Kevin Craze, NP [place] [phone]

## 2015-05-09 NOTE — Telephone Encounter (Signed)
Contacted patient by phone and spoke with patient's wife. Patient is status post treatment for tonsil cancer. Patient is now tolerating PEG feedings using Osmolite 1.5 at 60 cc an hour infusing approximately 5 cans daily. Patient is reported to be eating very small amounts occasionally throughout the day. Patient is having increased pain and is in contact with nurse practitioner who is prescribing additional medication. No nutrition impact symptoms noted. Per wife weight on home scale was 115 pounds in light clothing.  Nutrition diagnosis: Unintended weight loss improved.  Estimated nutrition needs: 1775-2000 calories, 72-85 grams protein, greater than 2 L fluid daily.  Intervention:  Educated patient to increase Osmolite 1.5 to 65 cc an hour tonight and continue to work up until goal of 85 cc an hours achieved to total at least 5-1/2 cans of formula daily. Patient continue free water flushes 120 cc 4 times a day plus a minimum of 720 cc free water by mouth. Encouraged patient to continue increasing oral intake of soft foods and liquids as tolerated. Teach back method used.  Monitoring, evaluation, goals:  Patient will tolerate tube feeding advancement to goal rate of a minimum of 5-1/2 cans of Osmolite 1.5 daily to provide 1953 cal, 82 g protein, 2196 mL free water meeting 100% of estimated needs. Patient will also increase intake of soft foods.  Next visit: I'll continue to follow patient as needed.  **Disclaimer: This note was dictated with voice recognition software. Similar sounding words can inadvertently be transcribed and this note may contain transcription errors which may not have been corrected upon publication of note.**

## 2015-05-10 ENCOUNTER — Telehealth: Payer: Self-pay | Admitting: Adult Health

## 2015-05-10 MED FILL — oxyCODONE HCL 5 MG/5ML SOLN: 5 | 16 days supply | Qty: 1500 | Fill #0

## 2015-05-10 NOTE — Telephone Encounter (Signed)
I received a voicemail from Dorann Ou from [place] to let me know that Mr. Plant doing well and has declined twice weekly visits for home health support.  He previously had ordered for twice weekly visits, but he has decided that he does not need that additional visit per week.  I think this is completely appropriate and I will defer to home health's judgment and recommendations on continued visits.  Verline Lema did not ask for a return call, but left her phone number in case of questions [phone]).    [name], NP [place] [phone]

## 2015-05-13 ENCOUNTER — Ambulatory Visit: Payer: Self-pay | Admitting: Adult Health

## 2015-05-14 ENCOUNTER — Telehealth: Payer: Self-pay | Admitting: *Deleted

## 2015-05-14 NOTE — Telephone Encounter (Signed)
  Oncology Nurse Navigator Documentation  Navigator Location: [facility] (05/14/15 1238) Navigator Encounter Type: Telephone (05/14/15 1238) Telephone: Outgoing Call (05/14/15 1238) Abnormal Finding Date: 10/12/14 (05/14/15 1238) Confirmed Diagnosis Date: 10/12/14 (05/14/15 1238)   Treatment Initiated Date: 12/03/14 (05/14/15 1238)   Treatment Phase: Post-Tx Follow-up (05/14/15 1238) Barriers/Navigation Needs: Coordination of Care (05/14/15 1238)   Interventions: Coordination of Care (05/14/15 1238)            Acuity: Level 2 (05/14/15 1238)   Acuity Level 2: Survivorship (05/14/15 1238)     LVMM for patient reminding him of importance of rescheduling his self-cancelled 03/06/15 post-XRT follow-up with Dr. Tommie Raymond, [facility].  Gayleen Orem, RN, BSN, [place] at [place] [phone]    Time Spent with Patient: 15 (05/14/15 1238)

## 2015-05-16 ENCOUNTER — Ambulatory Visit (HOSPITAL_COMMUNITY)
Admission: RE | Admit: 2015-05-16 | Discharge: 2015-05-16 | Disposition: A | Discharge status: Home / Self Care | Payer: Medicare Other | Source: Ambulatory Visit | Attending: Radiation Oncology | Admitting: Radiation Oncology

## 2015-05-16 ENCOUNTER — Ambulatory Visit (HOSPITAL_BASED_OUTPATIENT_CLINIC_OR_DEPARTMENT_OTHER)
Admission: RE | Admit: 2015-05-16 | Discharge: 2015-05-16 | Disposition: A | Discharge status: Home / Self Care | Payer: Medicare Other | Source: Ambulatory Visit | Attending: Radiation Oncology | Admitting: Radiation Oncology

## 2015-05-16 DIAGNOSIS — C099 Malignant neoplasm of tonsil, unspecified: Secondary | ICD-10-CM | POA: Insufficient documentation

## 2015-05-16 DIAGNOSIS — N281 Cyst of kidney, acquired: Secondary | ICD-10-CM | POA: Diagnosis not present

## 2015-05-16 DIAGNOSIS — Z9889 Other specified postprocedural states: Secondary | ICD-10-CM | POA: Insufficient documentation

## 2015-05-16 DIAGNOSIS — I700 Atherosclerosis of aorta: Secondary | ICD-10-CM | POA: Diagnosis not present

## 2015-05-16 DIAGNOSIS — Z931 Gastrostomy status: Secondary | ICD-10-CM | POA: Diagnosis not present

## 2015-05-16 LAB — COMPREHENSIVE METABOLIC PANEL
ALBUMIN: 3.9 g/dL (ref 3.5–5.0)
ALK PHOS: 87 U/L (ref 40–150)
ALT: 49 U/L (ref 0–55)
ANION GAP: 9 meq/L (ref 3–11)
AST: 34 U/L (ref 5–34)
BUN: 17.3 mg/dL (ref 7.0–26.0)
CALCIUM: 9.8 mg/dL (ref 8.4–10.4)
CHLORIDE: 101 meq/L (ref 98–109)
CO2: 31 mEq/L — ABNORMAL HIGH (ref 22–29)
Creatinine: 0.8 mg/dL (ref 0.7–1.3)
Glucose: 116 mg/dl (ref 70–140)
Potassium: 4.5 mEq/L (ref 3.5–5.1)
Sodium: 141 mEq/L (ref 136–145)
Total Bilirubin: 0.61 mg/dL (ref 0.20–1.20)
Total Protein: 6.9 g/dL (ref 6.4–8.3)

## 2015-05-16 LAB — GLUCOSE, CAPILLARY: Glucose-Capillary: 122 mg/dL — ABNORMAL HIGH (ref 65–99)

## 2015-05-16 LAB — MAGNESIUM: MAGNESIUM: 1.9 mg/dL (ref 1.5–2.5)

## 2015-05-16 MED ORDER — FLUDEOXYGLUCOSE F - 18 (FDG) INJECTION
5.8000 | Freq: Once | INTRAVENOUS | Status: AC | PRN
  Administered 2015-05-16: 5.8 via INTRAVENOUS

## 2015-05-16 NOTE — Progress Notes (Signed)
Kevin Mcclain presents for follow up of radiation completed 01/18/15 to his Right Tonsil and bilateral neck.    Pain issues, if any: He reports discomfort in his throat and jawbone, but much improved since radiation treatment.  Using a feeding tube?: Yes. He is instilling Osmolite 1.5. He runs it during the night at  85 ml/hr from about 6pm -9am. He states this equal 5 cans of osmolite.  Weight changes, if any: He is slowly gaining weight.  Wt Readings from Last 3 Encounters:  05/17/15 120 lb 8 oz (54.658 kg)  05/03/15 115 lb 3.2 oz (52.254 kg)  04/29/15 114 lb 8 oz (51.937 kg)   Swallowing issues, if any: He is eating softer foods. He reports he does not have saliva to help swallow because it is thick. He needs to have a liquid to help swallow. He is eating a lot of ice cream and milk per his report. He does not eat a lot which each meal.  Smoking or chewing tobacco? No Using fluoride trays daily? No Last ENT visit was on: He saw Dr. Erik Obey in December [id number]. They plan to schedule an appointment for another visit soon.  Other notable issues, if any: He is anxious about his PET scan results.   BP 131/58 mmHg  Pulse 82  Temp(Src) 98.1 F (36.7 C)  Ht 5\' 4"  (1.626 m)  Wt 120 lb 8 oz (54.658 kg)  BMI 20.67 kg/m2  SpO2 100%

## 2015-05-17 ENCOUNTER — Encounter: Payer: Self-pay | Admitting: Adult Health

## 2015-05-17 ENCOUNTER — Telehealth: Payer: Self-pay | Admitting: *Deleted

## 2015-05-17 ENCOUNTER — Other Ambulatory Visit: Payer: Self-pay | Admitting: Adult Health

## 2015-05-17 ENCOUNTER — Ambulatory Visit
Admission: RE | Admit: 2015-05-17 | Discharge: 2015-05-17 | Disposition: A | Discharge status: Home / Self Care | Payer: Medicare Other | Source: Ambulatory Visit | Attending: Radiation Oncology | Admitting: Radiation Oncology

## 2015-05-17 ENCOUNTER — Encounter: Payer: Self-pay | Admitting: Radiation Oncology

## 2015-05-17 ENCOUNTER — Encounter: Payer: Self-pay | Admitting: *Deleted

## 2015-05-17 ENCOUNTER — Other Ambulatory Visit: Payer: Self-pay | Admitting: Internal Medicine

## 2015-05-17 VITALS — BP 131/58 | HR 82 | Temp 98.1°F | Ht 64.0 in | Wt 120.5 lb

## 2015-05-17 DIAGNOSIS — C099 Malignant neoplasm of tonsil, unspecified: Secondary | ICD-10-CM

## 2015-05-17 DIAGNOSIS — B029 Zoster without complications: Secondary | ICD-10-CM

## 2015-05-17 LAB — PHOSPHORUS: PHOSPHORUS: 3.3 mg/dL (ref 2.5–4.5)

## 2015-05-17 MED ORDER — FENTANYL 50 MCG/HR TD PT72: 50.0000 ug | MEDICATED_PATCH | TRANSDERMAL | Status: DC

## 2015-05-17 MED ORDER — OXYCODONE HCL 5 MG/5ML PO SOLN: 10.0000 mg | ORAL | Status: DC | PRN

## 2015-05-17 MED FILL — fentaNYL 50 MCG/HR PT72: 50 | 15 days supply | Qty: 5 | Fill #0

## 2015-05-17 NOTE — Progress Notes (Signed)
I briefly saw [name] and his wife during their follow-up visit with Dr. Isidore Mcclain today.  He is feeling so much better. The Neurontin is helping control his post-herpetic neuralgia; he is taking it TID and is not having any side effects of the medication.  He received wonderful news today with a negative PET scan and no evidence of any recurrence or residual tumor.  He and his wife were very excited to hear this great news today.   I will continue to work with him to start weaning down/off of his pain medications.  I did refill the Fentanyl patch and Oxycodone solution today, maintaining previous dosing.  I will see them back in 2 weeks, with plans to decrease the Fentanyl patch to 25 mcg and decrease the dose of the Oxycodone as well.  We discussed the importance of slowly weaning off of these medications and not stopping them "cold [place]."    Dr. Isidore Mcclain would like to see the patient back in her clinic in about 6 months.  I will continue to keep Dr. Isidore Mcclain aware of our progress in his continued healing.  I offered congratulations for the great scan results today and encouraged them to call me if they have any questions or concerns before I see them again in about 2 weeks.    Kevin Craze, NP [place] [phone]

## 2015-05-17 NOTE — Telephone Encounter (Signed)
  Oncology Nurse Navigator Documentation  Navigator Location: [facility] (05/17/15 1552) Navigator Encounter Type: Telephone (05/17/15 1552) Telephone: [place] Call (05/17/15 1552)                 Interventions: Coordination of Care (05/17/15 1552)       Per Dr. Pearlie Oyster guidance, called [facility] to arrange routine follow-up visit for patient.  Spoke with  Kalman Shan, requested that patient be contacted and appt arranged to see Dr. Erik Obey in 2 months.   She verbalized understanding.  Gayleen Orem, RN, BSN, [place] [facility] [place] [phone]                     Time Spent with Patient: 15 (05/17/15 1552)

## 2015-05-17 NOTE — Progress Notes (Signed)
Radiation Oncology         [phone] ________________________________  Name: Kevin Mcclain MRN: [id number]  Date: 05/17/2015  DOB: 10-10-1945  Follow-Up Visit Note  CC: Drema Pry, DO  Jodi Marble, MD  Diagnosis and Prior Radiotherapy:       ICD-9-CM ICD-10-CM   1. 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  He reports discomfort in his throat and jawbone, but much improved since radiation treatment. He continues to use a feeding tube. He is instilling Osmolite 1.5. He runs it during the night at 85 ml/hr from about 6pm -9am. He states this is equal to 5 cans of osmolite. He is slowly gaining weight. He is eating softer foods. He reports he does not have saliva to help swallow because it is thick. He needs to have a liquid to help swallow. He is eating a lot of ice cream and milk per his report. He does not eat a lot which each meal. He experiences some pain while eating.   Denies smoking or chewing tobacco. Denies using fluoride trays daily.  He is anxious about his PET scan results. The majority of his pain is from shingles, he followed with [name], NP for this when his PCP was out sick.   He last saw Dr. Erik Obey in December [id number]. They plan to schedule an appointment for another visit soon.    ALLERGIES:  is allergic to benzonatate; codeine; and sitagliptin phosphate.  Meds: Current Outpatient Prescriptions  Medication Sig Dispense Refill  . gabapentin (NEURONTIN) 300 MG capsule Take 1 capsule (300 mg total) by mouth 3 (three) times daily. Take 1  capsule (300 mg) on Day 1. Next, take 1 capsule (300 mg) twice daily on Day 2. Then, take 1 capsule (300 mg) three times daily on Day 3 and all days thereafter. 90 capsule 2  . lidocaine (XYLOCAINE) 2 % solution Mix 1 part 2%viscous lidocaine,1part H2O.Swish and/or swallow 68ml of this mixture,3min before meals and at bedtime, up to QID 100 mL 5  . nitroGLYCERIN (NITROSTAT) 0.4 MG SL tablet Place 1 tablet (0.4 mg total) under the tongue every 5 (five) minutes as needed. 50 tablet 11  . ondansetron (ZOFRAN) 4 MG tablet Take 0.5 tablets (2 mg total) by mouth every morning. 2nd dose if needed 30 tablet 3  . OVER THE COUNTER MEDICATION Place 1 drop into both eyes 3 (three) times daily as needed (dry eyes/ irritation). Clear Cooling Eye Drops    . pantoprazole (PROTONIX) 40 MG tablet Take 1 tablet (40 mg total) by mouth daily. 30 tablet 5  . valACYclovir (VALTREX) 1000 MG tablet Take 1 tablet (1,000 mg total) by mouth 3 (three) times daily. 21 tablet 0  . fentaNYL (DURAGESIC - DOSED MCG/HR) 50 MCG/HR Place 1 patch (50 mcg total) onto the skin every 3 (three) days. 5 patch 0  . metoprolol succinate (TOPROL-XL) 50 MG 24 hr tablet TAKE 1 TABLET BY MOUTH DAILY. TAKE WITH OR IMMEDIATELY FOLLOWING A MEAL 90 tablet 1  . Nutritional Supplements (  FEEDING SUPPLEMENT, VITAL 1.5 CAL,) LIQD Begin Vital 1.5 at 20 cc/hr continuously on Friday, 1-24.  If tolerated, increase to 25 cc/hr day 3 and 30 cc/hr on Day 4. Flush with 120 cc free water QID plus and additional 720 cc water by mouth daily.  Please send supplies to home and instruct patient on continuous feeding pump. (Patient not taking: Reported on 05/17/2015) 1422 mL   . ondansetron (ZOFRAN-ODT) 8 MG disintegrating tablet Take 1 tablet (8 mg total) by mouth every 8 (eight) hours as needed for nausea or vomiting. (Patient not taking: Reported on 05/17/2015) 30 tablet 5  . oxyCODONE (ROXICODONE) 5 MG/5ML solution Take 10 mLs (10 mg total) by mouth every 4 (four) hours as  needed for moderate pain or severe pain. Take 10-15 mLs (10 mg-15 mg) by mouth every 4 hours as needed for pain. 1500 mL 0   No current facility-administered medications for this encounter.    Physical Findings: The patient is in no acute distress. Patient is alert and oriented. Wt Readings from Last 3 Encounters:  05/17/15 120 lb 8 oz (54.658 kg)  05/03/15 115 lb 3.2 oz (52.254 kg)  04/29/15 114 lb 8 oz (51.937 kg)    height is 5\' 4"  (1.626 m) and weight is 120 lb 8 oz (54.658 kg). His temperature is 98.1 F (36.7 C). His blood pressure is 131/58 and his pulse is 82. His oxygen saturation is 100%. .  General: Alert and oriented, in no acute distress HEENT: Head is normocephalic.  No tumor noted in the oropharynx or oral cavity. Mucous membranes are slightly dry. There is a slight coating of white on the tongue, consistent with resolving mucositis. No thrush noted. Some telangiectasia in the left oropharynx consistent with healing. Neck: Neck is supple with no palpable masses in the supraclavicular or cervicle region. Mild edema in the anterior neck. Lungs. Clear to auscultation bilaterally. Heart: Regular, rhythm, and rate. No murmur heard.  Abdomen: PEG tube site appears healthy.  Skin: Skin in treatment fields shows good healing. Psychiatric: Judgment and insight are intact. Affect is appropriate.   Lab Findings: Lab Results  Component Value Date   WBC 6.4 04/25/2015   HGB 13.0 04/25/2015   HCT 37.9* 04/25/2015   MCV 90.7 04/25/2015   PLT 152 04/25/2015    Lab Results  Component Value Date   TSH 1.878 03/13/2015    Radiographic Findings: Ir Gastrostomy Tube Mod Sed  04/25/2015  INDICATION: 70 year old male with a history of head and neck cancer status post chemo radiation therapy. He now has pain with swallowing and is developing protein calorie malnutrition. Percutaneous gastrostomy tube placement is warranted for nutrition. EXAM: Fluoroscopically guided placement of  percutaneous pull-through gastrostomy tube Interventional Radiologist:  Criselda Peaches, MD MEDICATIONS: 2 g Ancef; Antibiotics were administered within 1 hour of the procedure. ANESTHESIA/SEDATION: Versed 3 mg IV; Fentanyl 100 mcg IV Moderate Sedation Time:  10 The patient was continuously monitored during the procedure by the interventional radiology nurse under my direct supervision. CONTRAST:  10 mL Omnipaque 300 FLUOROSCOPY TIME:  Fluoroscopy Time: 1 minutes 6 seconds (6 mGy). COMPLICATIONS: None immediate. PROCEDURE: Informed written consent was obtained from the patient after a thorough discussion of the procedural risks, benefits and alternatives. All questions were addressed. Maximal Sterile Barrier Technique was utilized including caps, mask, sterile gowns, sterile gloves, sterile drape, hand hygiene and skin antiseptic. A timeout was performed prior to the initiation of the procedure. Maximal barrier sterile technique utilized including caps,  mask, sterile gowns, sterile gloves, large sterile drape, hand hygiene, and chlorhexadine skin prep. An angled catheter was advanced over a wire under fluoroscopic guidance through the nose, down the esophagus and into the body of the stomach. The stomach was then insufflated with several 100 ml of air. Fluoroscopy confirmed location of the gastric bubble, as well as inferior displacement of the barium stained colon. Under direct fluoroscopic guidance, a single T-tack was placed, and the anterior gastric wall drawn up against the anterior abdominal wall. Percutaneous access was then obtained into the mid gastric body with an 18 gauge sheath needle. Aspiration of air, and injection of contrast material under fluoroscopy confirmed needle placement. An Amplatz wire was advanced in the gastric body and the access needle exchanged for a 9-French vascular sheath. A snare device was advanced through the vascular sheath and an Amplatz wire advanced through the angled  catheter. The Amplatz wire was successfully snared and this was pulled up through the esophagus and out the mouth. A 20-French Alinda Dooms MIC-PEG tube was then connected to the snare and pulled through the mouth, down the esophagus, into the stomach and out to the anterior abdominal wall. Hand injection of contrast material confirmed intragastric location. The T-tack retention suture was then cut. The pull through peg tube was then secured with the external bumper and capped. The patient will be observed for several hours with the newly placed tube on low wall suction to evaluate for any post procedure complication. The patient tolerated the procedure well, there is no immediate complication. IMPRESSION: Successful placement of a 20 French pull through gastrostomy tube. Signed, Criselda Peaches, MD [facility] Rad[facility] Electronically Signed   By: Jacqulynn Cadet M.D.   On: 04/25/2015 15:57   Nm Pet Image Restag (ps) Skull Base To Thigh  05/16/2015  CLINICAL DATA:  Subsequent treatment strategy for head and neck carcinoma. EXAM: NUCLEAR MEDICINE PET SKULL BASE TO THIGH TECHNIQUE: 5.8 mCi F-18 FDG was injected intravenously. Full-ring PET imaging was performed from the skull base to thigh after the radiotracer. CT data was obtained and used for attenuation correction and anatomic localization. FASTING BLOOD GLUCOSE:  Value: 122 mg/dl COMPARISON:  10/29/2014. FINDINGS: NECK Resolution of previous hypermetabolic activity associated with right tonsil. CHEST No hypermetabolic mediastinal or hilar nodes. Aortic atherosclerosis is identified. Previous median sternotomy and CABG procedure. Similar appearance of nonspecific uptake within within bilateral hilar regions. Likely physiologic. No suspicious pulmonary nodules on the CT scan. ABDOMEN/PELVIS No abnormal hypermetabolic activity within the liver, pancreas, adrenal glands, or spleen. Gastrostomy tube noted.  Aortic atherosclerosis identified. Bilateral renal cysts are again identified. No hypermetabolic lymph nodes in the abdomen or pelvis. SKELETON No focal hypermetabolic activity to suggest skeletal metastasis. IMPRESSION: 1. Interval response to therapy. 2. Resolution of hyper metabolism associated with right tonsillar lesion. 3. No evidence for residual or recurrence of tumor or metastatic disease. 4. Aortic atherosclerosis. Electronically Signed   By: Kerby Moors M.D.   On: 05/16/2015 14:40    Impression/Plan:    1) Head and Neck Cancer Status: NED.  PET images from 05/16/15 reviewed and he has had a complete response to therapy. He is gaining weight and continuing oral and PEG tube feedings.  2) Nutritional Status: Improving and gaining weight.  PEG tube: Yes; He is instilling Osmolite 1.5. He runs it during the night at 85 ml/hr from about 6pm -9am. He states this is equal to 5 cans of osmolite.  3) Risk  Factors: The patient has been educated about risk factors including  tobacco abuse; they understand that avoidance of tobacco is important to prevent  other cancers  4) Swallowing: functional, but he has not followed through with SLP follow-up, we will try to get him in with [place].  5) Dental: Encouraged to continue regular followup with dentistry, and dental hygiene including fluoride rinses.    6) Thyroid function: Lab Results  Component Value Date   TSH 1.878 03/13/2015    7) Other: [name], NP refilled the patient's fentanyl patch and oxycodone today. At [facility] clinic he will see SLP, nutrition, and physical therapy to address his recovery.   8) Patient also seen by [name], NP of survivorship today.  Plan is to followup with [name], NP in 2 weeks to reassess pain levels and start pain medication taper. He will follow with ENT, Dr. Erik Obey in 2 months.  I encouraged patient to call me or Elzie Rings with any concerns or questions that might arise before then.  Elzie Rings will arrange follow up with me later this year.  _____________________________________   Eppie Gibson, MD   This document serves as a record of services personally performed by Eppie Gibson, MD. It was created on her behalf by Arlyce Harman, a trained medical scribe. The creation of this record is based on the scribe's personal observations and the provider's statements to them. This document has been checked and approved by the attending provider.

## 2015-05-17 NOTE — Progress Notes (Signed)
  Oncology Nurse Navigator Documentation  Navigator Location: [facility] (05/17/15 1120) Navigator Encounter Type: Follow-up Appt (05/17/15 1120)           Patient Visit Type: RadOnc (05/17/15 1120) Treatment Phase: Post-Tx Follow-up (05/17/15 1120)       To provide support and encouragement, care continuity and to assess for needs, met with Kevin Mcclain during f/u appt with Dr. Isidore Moos.  He was accompanied by his wife [name].  [name], NP Survivorship, was present also.  They voiced understanding of Dr. Pearlie Oyster:  Discussion of favorable PET results.  Recommendation to see ENT Dr. Erik Obey in 2 months, that I will initiate the scheduling of that appt with [facility].  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 They understand I will arrange for his attendance at an upcoming [place] to see these practitioners.  Recommendation to see Dr. Enrique Sack for post-XRT follow-up.    Encouragement to resume daily use of fluoride trays. I discussed the upcoming April H&N [facility] and H&N [facility], encouraged them to attend.  They voiced interest, understand further information will be mailed next week. They did not express any needs or concerns at this time, I encouraged him to contact me if that changes, they verbalized understanding.  Gayleen Orem, RN, BSN, [place] [facility] [place] [phone]                             Time Spent with Patient: 30 (05/17/15 1120)

## 2015-05-20 MED FILL — PANTOPRAZOLE SOD DR 40 MG T: 40 | 30 days supply | Qty: 30 | Fill #2

## 2015-05-24 MED FILL — oxyCODONE HCL 5 MG/5ML SOLN: 5 | 13 days supply | Qty: 1500 | Fill #0

## 2015-05-30 ENCOUNTER — Ambulatory Visit (HOSPITAL_BASED_OUTPATIENT_CLINIC_OR_DEPARTMENT_OTHER): Payer: Medicare Other | Admitting: Adult Health

## 2015-05-30 ENCOUNTER — Encounter: Payer: Self-pay | Admitting: Adult Health

## 2015-05-30 VITALS — BP 129/50 | HR 71 | Resp 98 | Wt 125.9 lb

## 2015-05-30 DIAGNOSIS — R070 Pain in throat: Secondary | ICD-10-CM

## 2015-05-30 DIAGNOSIS — I890 Lymphedema, not elsewhere classified: Secondary | ICD-10-CM | POA: Diagnosis not present

## 2015-05-30 DIAGNOSIS — C099 Malignant neoplasm of tonsil, unspecified: Secondary | ICD-10-CM

## 2015-05-30 DIAGNOSIS — M792 Neuralgia and neuritis, unspecified: Secondary | ICD-10-CM

## 2015-05-30 DIAGNOSIS — E46 Unspecified protein-calorie malnutrition: Secondary | ICD-10-CM

## 2015-05-30 DIAGNOSIS — K117 Disturbances of salivary secretion: Secondary | ICD-10-CM

## 2015-05-30 DIAGNOSIS — B029 Zoster without complications: Secondary | ICD-10-CM

## 2015-05-30 MED ORDER — OXYCODONE HCL 5 MG/5ML PO SOLN: 10.0000 mg | ORAL | Status: DC | PRN

## 2015-05-30 MED ORDER — FENTANYL 25 MCG/HR TD PT72: 25.0000 ug | MEDICATED_PATCH | TRANSDERMAL | Status: DC

## 2015-05-30 MED ORDER — VALACYCLOVIR HCL 1 G PO TABS: 1000.0000 mg | ORAL_TABLET | Freq: Three times a day (TID) | ORAL | Status: DC

## 2015-05-30 MED FILL — valACYclovir HCL 1 GM TABS: 1 | 7 days supply | Qty: 21 | Fill #0

## 2015-05-30 NOTE — Progress Notes (Signed)
CLINIC:  Survivorship  REASON FOR VISIT:  Routine follow-up for head & neck cancer & to address post-treatment acute survivorship needs.   BRIEF ONCOLOGIC HISTORY:  Oncology History   Tonsil cancer   Staging form: Pharynx - Oropharynx, AJCC 7th Edition     Clinical: Stage II (T2, N0, M0) - Signed by Eppie Gibson, MD on 10/31/2014       Tonsil cancer ([place])   12/04/2013 Miscellaneous TSH: 2.65; no h/o hypothyroidism.   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    11/06/2014 Imaging CT neck: Right palatine tonsil mass measuring 2.1 x 2.1 x 2.5 cm.  Asymmetric right LNs in level 1B, 2, and level 3.    12/03/2014 - 01/18/2015 Radiation Therapy Right tonsil and bilateral neck / 70 Gy in 35 fractions to gross disease, 63 Gy in 35 fractions to high risk nodal echelons, and 56 Gy in 35 fractions to intermediate risk nodal echelons   01/06/2015 Miscellaneous ER VISIT for dysphagia.  No hospital admission. CT neck done and pt was able to tolerate po liquids afer IV zofran. Discharged home.    01/06/2015 Imaging CT neck: Significant interval treatment response with no residual measurable right tonsil mass. Mild mucosal thickening and hyperenhancement in bilateral oropharynx & epiglottis, likely tx effect. No cervical adenpathy.   04/25/2015 Procedure PEG placement    05/16/2015 PET scan Restaging PET scan: Interval response to therapy. Resolution of hypermetabolism associated with right tonsillar lesion. No evidence of residual or recurrence of tumor or metastatic disease.     INTERVAL HISTORY:  Mr. Farnam presents today for routine follow-up. Since his last visit to the survivorship clinic, he had his restaging PET scan and  had an office visit with Dr. Isidore Moos.  He received excellent news that his tonsil cancer responded very well to treatment and there was no evidence of any residual tumor or metastatic disease on the scan.  Since that time, Mr. Chima tells me that he has been doing much better regarding his physical symptoms, with the exception of his shingles related pain.  He feels that "the shingles are back" and that he has new lesions on his left arm.  This pain is better with the addition of the gabapentin, which is continues to take.  His mouth/throat pain is improving and he is able to tolerate more foods.  His tube feedings are going well. His energy is better; he was able to mow his grass (riding lawnmower) and cut down branches yesterday. He uses the Zofran periodically for nausea, but this is less often than previous.    Pain: New, worsening pain to left upper arm; throat pain improving   Nutritional Status:  -Intake/Diet: Tube feedings and soft foods.  -Using a feeding tube? Yes -Weight change: GAIN ~5 lbs since 05/17/15 (GAIN ~10 lbs in past month!)  Swallowing:  Much less pain with swallowing; "more good days than bad; it is definitely getting better"   Last ENT visit: Has not seen Dr. Erik Obey since completion of radiation.  Last Dentist visit:   Is scheduled to see Dr. Enrique Sack on Monday, 06/03/15 per pt.   Last TSH:   TSH 1.878 on 03/13/15    ADDITIONAL REVIEW OF SYSTEMS:  Review of Systems  Constitutional: Negative  for fever.       -Endorses occasional night sweats that have been getting worse in the past 6 weeks.  -Energy levels improving  HENT: Positive for sore throat.        -Sore throat pain being managed with Oxycodone and Fentanyl patch  Gastrointestinal: Negative for diarrhea and constipation.       -Reports increased belching "when my stomach is too full, but the acid pill helps that."    Genitourinary: Negative for dysuria.  Skin: Positive for rash.       -Shingles; "I think the  shingles are back and I think I have a new rash on my left arm; it hurts really bad."      CURRENT MEDICATIONS:  Current Outpatient Prescriptions on File Prior to Visit  Medication Sig Dispense Refill  . gabapentin (NEURONTIN) 300 MG capsule Take 1 capsule (300 mg total) by mouth 3 (three) times daily. Take 1 capsule (300 mg) on Day 1. Next, take 1 capsule (300 mg) twice daily on Day 2. Then, take 1 capsule (300 mg) three times daily on Day 3 and all days thereafter. 90 capsule 2  . lidocaine (XYLOCAINE) 2 % solution Mix 1 part 2%viscous lidocaine,1part H2O.Swish and/or swallow 76ml of this mixture,40min before meals and at bedtime, up to QID 100 mL 5  . metoprolol succinate (TOPROL-XL) 50 MG 24 hr tablet TAKE 1 TABLET BY MOUTH DAILY. TAKE WITH OR IMMEDIATELY FOLLOWING A MEAL 90 tablet 1  . nitroGLYCERIN (NITROSTAT) 0.4 MG SL tablet Place 1 tablet (0.4 mg total) under the tongue every 5 (five) minutes as needed. 50 tablet 11  . Nutritional Supplements (FEEDING SUPPLEMENT, VITAL 1.5 CAL,) LIQD Begin Vital 1.5 at 20 cc/hr continuously on Friday, 1-24.  If tolerated, increase to 25 cc/hr day 3 and 30 cc/hr on Day 4. Flush with 120 cc free water QID plus and additional 720 cc water by mouth daily.  Please send supplies to home and instruct patient on continuous feeding pump. (Patient not taking: Reported on 05/17/2015) 1422 mL   . ondansetron (ZOFRAN) 4 MG tablet Take 0.5 tablets (2 mg total) by mouth every morning. 2nd dose if needed 30 tablet 3  . ondansetron (ZOFRAN-ODT) 8 MG disintegrating tablet Take 1 tablet (8 mg total) by mouth every 8 (eight) hours as needed for nausea or vomiting. (Patient not taking: Reported on 05/17/2015) 30 tablet 5  . OVER THE COUNTER MEDICATION Place 1 drop into both eyes 3 (three) times daily as needed (dry eyes/ irritation). Clear Cooling Eye Drops    . pantoprazole (PROTONIX) 40 MG tablet Take 1 tablet (40 mg total) by mouth daily. 30 tablet 5   No current  facility-administered medications on file prior to visit.    ALLERGIES:  Allergies  Allergen Reactions  . Benzonatate Swelling    "swells my throat"  . Codeine Itching  . Sitagliptin Phosphate Itching   IV FLUID ADMINISTRATION RECORD: Date Volume & IVF  02/07/15 1L NS  02/01/15 1L NS  01/30/15 1L NS   01/29/15 1L NS  01/28/15 (IVF recommended, but pt declined)  01/25/15 1L NS + 30 meq KCl  01/23/15 1L NS + 30 meq KCl  01/22/15 1L NS + 30 meq KCl (+ IV Zofran)  01/18/15 (last day of tx) 1L NS + 30 meq KCl  01/17/15 (during tx) 1L NS + 30 meq KCl  01/16/15 (during tx) 1L NS + 30 meq KCl   01/15/15 (during tx) 1L NS   01/14/15 (  during tx) 1L NS   01/12/15 (during tx) 1L NS   01/10/15 (during tx) 1L NS   01/08/15 (during tx) 1L NS            PHYSICAL EXAM:  Filed Vitals:   05/30/15 1100  BP: 129/50  Pulse: 71  Resp: 98   Weight Date  125 lb 14.4 oz (57.108 kg) 05/30/15  120 lb 8 oz (54.658 kg) 05/17/15  115 lb 3.2 oz (52.254 kg) 05/03/15  114 lb 8 oz (51.937 kg) 04/29/15  115 lb 4.8 oz (52.3 kg) 04/23/15  115 lb 12.8 oz (52.527 kg) 04/15/15  119 lb 9.6 oz (54.25 kg) 03/13/15  121 lb 8 oz (55.112 kg) 02/26/15  120 lb 6.4 oz (54.613 kg) 02/18/15  122 lb 8 oz (55.566 kg) 02/11/15  124 lb 9.6 oz (56.518 kg) 02/07/15  125 lb 9.6 oz (56.972 kg) 02/04/15  127 lb 14.4 oz (58.015 kg) 01/28/15  130 lb 6.4 oz (59.149 kg) 01/21/15  132 lb 3.2 oz (59.966 kg) 01/14/15  136 lb 12.8 oz (62.052 kg) 01/07/15  142 lb 12.8 oz (64.774 kg) 12/24/14  154 lb 9.6 oz (70.126 kg) Pre-treatment: 10/31/14          General: Thin male in no distress.  Accompanied by his wife today.  HEENT: Head is atraumatic and normocephalic.  Pupils equal and reactive to light. Conjunctivae clear without exudate.  Sclerae anicteric.  Mucous membranes are dry. Tongue is mildly dry. Posterior oropharynx with telangectasia associated with radiation therapy.   (+) trismus.  Lymph: No cervical, supraclavicular, or infraclavicular lymphadenopathy.  Neck: No palpable masses. (+) anterior neck mild lymphedema and mild fibrosis.  Skin on neck is dry.  Cardiovascular: Normal rate and regular rhythm. Respiratory: Clear to auscultation bilaterally. Chest expansion symmetric without accessory muscle use. Breathing non-labored. GI: Abdomen soft and flat. No tenderness to palpation. Bowel sounds normoactive. Patient removed dressing for assessment of G-tube site-no redness or edema; normal mild yellow drainage; no induration or erythema.  Neuro: No focal deficits. Steady gait. Left arm nerve pain associated with shingles.  No motor deficits. Psych: Normal mood and affect for situation.  Skin: Left upper arm with unhealed vesicular rash, slightly crusted; difficult to assess if these are new lesions. Scars noted to left inner wrist and forearm. Upper neck (site of previous vesicular patch) now hyperpigmented and healed.    LABORATORY DATA:  None at this visit.  DIAGNOSTIC IMAGING:  None at this visit.    ASSESSMENT & PLAN:  Mr. Vota is a pleasant 70 y.o. male with history of cancer of the right tonsil, treated with IMRT alone; completed treatment on 01/18/15. Patient presents to survivorship clinic today for routine follow-up and to address acute survivorship needs.  1. Cancer of the right tonsil:  Mr. Cogburn continues to recover from definitive treatment with radiation therapy to his head and neck.  Clinically, he is much improved and there are no signs of recurrence. Restaging PET scan on 05/16/15 revealed interval response to therapy, resolution of right tonsillar lesion, and no evidence of metastatic disease. He will continue to be seen in the survivorship clinic to address his acute survivorship concerns and to wean off of his pain medications.  He needs to see Dr. Erik Obey in [id number]; He will see Dr. Isidore Moos in 11/2015 for surveillance visits.     2. 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 Smittle states are new to this left upper arm are in similar  distrubution and area of previously treated outbreak. On physical exam, it is difficult to tell if these are indeed "new" lesions.  However, it is possible that his previous course of Valtrex was insufficient to fully treat the infection and there is still potentially some virus replication.  There are no new lesions in new/different locations than his first presentation, but the vesicles are still present, in various stages of healing. Plus, the patient has had clinically worsening symptoms in recent days.  Therefore, I have e-prescribed Valtrex 1000 mg TID x 7 days and we will see if he clinically improves with this additional course of antiviral therapy.  He should continue the gabapentin TID for post-herpetic neuralgia.      3. Pain management: His throat pain is improving and he is continuing to progress towards healing. His oral exam is much improved from previous as well.  Aside from his post-herpetic neuralgia pain (for which he will restart Valtrex and continue gabapentin), it is reasonable to begin to slowly wean down on his opiates.  Therefore, I have decreased his Fentanyl patch to 25 mcg (#10, no refills).   I also refilled the oxycodone solution (#1750 ml, no refills).  I provided them with these prescriptions today.  I encouraged Mr. Whary to begin to self-titrate the oxycodone as he is able.  For example, if his pain is not severe, then he should try to take only 10 mg of the oxycodone; for more severe pain, he can take the 15 mg.  I also encouraged him to try to skip prn doses when his pain is less severe, rather than taking it every 4 hours as he has previously been doing. Slowly self-titrating his prn oxycodone over the next 3-4 weeks will help [place] continue to wean off of the opiates all together.  I explained that this would be a gradual  weaning process and if he experiences increased uncontrolled pain, he should let me know and we could make adjustments as needed. I am intentionally going slow with this patient given his continued post-herpetic neuralgia pain, which likely requires a combined gabapentin-opiate pain management approach in the short-term.  Mr. Reznick and his wife understand this plan and agree with it.    4. Night sweats:  He has a history of diabetes and tells me that he recently restarted his Metformin 500 mg earlier this week.  (He has not seen his PCP in several weeks as he is currently out on medical leave and they are going to have to see someone else in the practice).  His last Hgb A1c was 5.4 in 04/2015 with his PCP.  I offered that a potential etiology of the night sweats was hypoglycemia, however Mr. Mcshea and his wife tell me that the night sweats have gotten worse in the past 6 weeks or so. Also, he is continuing to perform continuous tube feedings during the night, so hypoglycemia seems less likely.  I am not suspicious of a cancer-related etiology of this symptom, but I did encourage him to follow-up with his new PCP whenever they are able to get an appointment.  They agreed with this plan  5. Bowel regimen/Constipation prevention: Encouraged him to continue taking the Miralax as prescribed/recommended by GI. My goal for him is to have a BM every day or every other day, particularly while taking opiates for pain control.  I reiterated the importance of maintaining an adequate bowel regimen.   6. Protein-calorie malnutrition, improving:  Mr. Salazar has gained 5  pounds in the past 2 weeks and 10 pounds total in the past month, which is terrific!    I am very encouraged by his weight gain and commended he and his wife's efforts.  I also encouraged him to continue to eat "real" foods, in addition to his tube feedings, as he is able.  He is doing well with this effort.  He is interested in discussing the  option of transitioning to bolus tube feedings, which I think is reasonable.  I will defer to [name], RD and her recommendations on this.  Mr. Covin expressed interest in when he will be able to get the feeding tube removed; he understands that it will likely be several more weeks before this would be appropriate, but he is curious.  I let him know that as soon as he is 1) able to maintain his weight and fluid intake all by mouth and 2) when Garald Balding, SLP deems his swallowing is safe, then we can discuss having his feeding tube removed.  He is satisfied by this and remains motivated to continue to improve toward the goal of having the PEG removed.  I will work with Gayleen Orem, RN-H&N Nurse Navigator to get the patient setup to be seen in the next [facility]) for H&N cancer patients to be seen by our oncology dietitian, speech therapist, and physical therapist.    7. Neck lymphedema:   Mr. Whitney and his wife are interested in learning massage techniques from PT on how best to manage his mild neck lymphedema.  I will ask Gayleen Orem, RN-H&N Nurse Navigator to help get the [facility] visit coordinated for the patient.    8. Xerostomia/Oral care/Trismus: I encouraged him to keep up the good work in managing his dry mouth (the Biotene products and water sips are helpful for him). He also uses a humidifier at night to help with the xerostomia as well.  He has a dental follow-up with Dr. Enrique Sack on Monday, 06/03/15 per patient's wife.  He will need continued reinforcement to perform his trismus exercises.      Dispo:  -I will coordinate with Gayleen Orem, RN for patient to be evaluated in an upcoming [place] with our dietitian, physical therapist, and speech therapist.   -I will plan to see the patient in 3-4 weeks to re-evaluate his pain and continue to wean down his opiate use.  -They know to call me with any questions or concerns prior to their next appointment at the cancer center.      A total of 30 minutes was spent in face-to-face care for this patient, with greater than 50% of that time in counseling and care-coordination.    [name], NP [place] [phone]

## 2015-05-31 MED FILL — fentaNYL 25 MCG/HR PT72: 25 | 30 days supply | Qty: 10 | Fill #0

## 2015-06-03 ENCOUNTER — Ambulatory Visit (HOSPITAL_COMMUNITY): Payer: Self-pay | Admitting: Dentistry

## 2015-06-03 ENCOUNTER — Encounter (HOSPITAL_COMMUNITY): Payer: Self-pay | Admitting: Dentistry

## 2015-06-03 ENCOUNTER — Telehealth: Payer: Self-pay | Admitting: Adult Health

## 2015-06-03 ENCOUNTER — Other Ambulatory Visit: Payer: Self-pay | Admitting: Adult Health

## 2015-06-03 ENCOUNTER — Encounter (INDEPENDENT_AMBULATORY_CARE_PROVIDER_SITE_OTHER): Payer: Self-pay

## 2015-06-03 VITALS — BP 132/52 | HR 67 | Temp 98.2°F | Wt 125.0 lb

## 2015-06-03 DIAGNOSIS — R432 Parageusia: Secondary | ICD-10-CM

## 2015-06-03 DIAGNOSIS — R1310 Dysphagia, unspecified: Secondary | ICD-10-CM

## 2015-06-03 DIAGNOSIS — K117 Disturbances of salivary secretion: Secondary | ICD-10-CM

## 2015-06-03 DIAGNOSIS — C099 Malignant neoplasm of tonsil, unspecified: Secondary | ICD-10-CM

## 2015-06-03 DIAGNOSIS — R682 Dry mouth, unspecified: Secondary | ICD-10-CM

## 2015-06-03 DIAGNOSIS — K036 Deposits [accretions] on teeth: Secondary | ICD-10-CM

## 2015-06-03 DIAGNOSIS — Z923 Personal history of irradiation: Secondary | ICD-10-CM

## 2015-06-03 MED ORDER — FENTANYL 50 MCG/HR TD PT72: 50.0000 ug | MEDICATED_PATCH | TRANSDERMAL | Status: DC

## 2015-06-03 MED ORDER — SODIUM FLUORIDE 1.1 % DT CREA
TOPICAL_CREAM | DENTAL | Status: DC
Start: 2015-06-03 — End: 2021-12-10

## 2015-06-03 MED FILL — GABAPENTIN 300 MG CAPSULE: 300 | 30 days supply | Qty: 90 | Fill #1

## 2015-06-03 MED FILL — SF 5000 PLUS CREAM: 1.1 | 30 days supply | Qty: 51 | Fill #0

## 2015-06-03 NOTE — Progress Notes (Signed)
I received a call from Kevin Mcclain letting me know that he placed the Fentanyl 25 mcg patch on Saturday, and his pain has progressively gotten worse over the weekend.  His throat pain has returned and has been as high as 7-8/10, even affecting his ability to sleep.    We discussed increasing his Fentanyl patch either to 37 mcg (25 mcg + 12 mcg as 2 separate patches) or back to 50 mcg.  Based on his increased pain, I decided to increase his Fentanyl patch back to 50 mcg for the next 1-2 weeks.  I let him know that it may take about 24 hours for the increased Fentanyl dose to return to steady state in his body.  I encouraged him to use the oxycodone as needed for breakthrough pain, but later in the week to try to begin to decrease the amount of oxycodone he is using each time.  He tells me that he was already beginning to use less until Saturday when his pain got worse with the 25 mcg patch.    I have left the new prescription (Fentanyl 50 mcg patch transdermal, Q72 hours, #5, no refills) at the radiation oncology nursing station for them to pick up at their convenience.    I encouraged Kevin Mcclain to call me later in the week to let me know how he is doing. He voiced understanding and agreed.   [name], NP [place] [phone]

## 2015-06-03 NOTE — Patient Instructions (Addendum)
RECOMMENDATIONS: 1. Brush after meals and at bedtime.  Use fluoride at bedtime. 2. Use trismus exercises as directed. 3. Use Biotene Rinse or salt water/baking soda rinses. 4. Multiple sips of water as needed. 5. Return to Dr. Lavone Neri, per patient request, for exam and cleaning and evaluation for other dental treatment as needed.  Call if problems before then.  Lenn Cal, DDS     RADIATION THERAPY AND DECISIONS REGARDING YOUR TEETH  Xerostomia (dry mouth) Your salivary glands may be in the filed of radiation.  Radiation may include all or part of your saliva glands.  This will cause your saliva to dry up and you will have a dry mouth.  The dry mouth will be for the rest of your life unless your radiation oncologist tells you otherwise.  Your saliva has many functions:  Saliva wets your tongue for speaking.  It coats your teeth and the inside of your mouth for easier movement.  It helps with chewing and swallowing food.  It helps clean away harmful acid and toxic products made by the germs in your mouth, therefore it helps prevent cavities.  It kills some germs in your mouth and helps to prevent gum disease.  It helps to carry flavor to your taste buds.  Once you have lost your saliva you will be at higher risk for tooth decay and gum disease.  What can be done to help improve your mouth when there's not enough saliva:  1.  Your dentist may give a prescription for Salagen.  It will not bring back all of your saliva but may bring back some of it.  Also your saliva may be thick and ropy or white and foamy. It will not feel like it use to feel.  2.  You will need to swish with water every time your mouth feels dry.  YOU CANNOT suck on any cough drops, mints, lemon drops, candy, vitamin C or any other products.  You cannot use anything other than water to make your mouth feel less dry.  If you want to drink anything else you have to drink it all at once and brush afterwards.   Be sure to discuss the details of your diet habits with your dentist or hygienist.  Radiation caries: This is decay that happens very quickly once your mouth is very dry due to radiation therapy.  Normally cavities take six months to two years to become a problem.  When you have dry mouth cavities may take as little as eight weeks to cause you a problem.  This is why dental check ups every two months are necessary as long as you have a dry mouth. Radiation caries typically, but not always, start at your gum line where it is hard to see the cavity.  It is therefore also hard to fill these cavities adequately.  This high rate of cavities happens because your mouth no longer has saliva and therefore the acid made by the germs starts the decay process.  Whenever you eat anything the germs in your mouth change the food into acid.  The acid then burns a small hole in your tooth.  This small hole is the beginning of a cavity.  If this is not treated then it will grow bigger and become a cavity.  The way to avoid this hole getting bigger is to use fluoride every evening as prescribed by your dentist.  You have to make sure that your teeth are very clean before you  use the fluoride.  This fluoride in turn will strengthen your teeth and prepare them for another day of fighting acid.  If you develop radiation caries many times the damage is so large that you will have to have all your teeth removed.  This could be a big problem if some of these teeth are in the field of radiation.  Further details of why this could be a big problem will follow.  (See Osteoradionecrosis).  Loss of taste (dysgeusia) This happens to varying degrees once you've had radiation therapy to your jaw region.  Many times taste is not completely lost but becomes limited.  The loss of taste is mostly due to radiation affecting your taste buds.  However if you have no saliva in your mouth to carry the flavor to your taste buds it would be difficult  for your taste buds to taste anything.  That is why using water or a prescription for Salagen prior to meals and during meals may help with some of the taste.  Keep in mind that taste generally returns very slowly over the course of several months or several years after radiation therapy.  Don't give up hope.  Trismus According to your Radiation Oncologist your TMJ or jaw joints are going to be partially or fully in the field of radiation.  This means that over time the muscles that help you open and close your mouth may get stiff.  This will potentially result in your not being able to open your mouth wide enough or as wide as you can open it now.  Le me give you an example of how slowly this happens and how unaware people are of it.  A gentlemen that had radiation therapy two years ago came back to me complaining that bananas are just too large for him to be able to fit them in between his teeth.  He was not able to open wide enough to bite into a banana.  This happens slowly and over a period of time.  What do we do to try and prevent this?  Your dentist will probably give you a stack of sticks called a trismus exercise device .  This stack will help your remind your muscles and your jaw joint to open up to the same distance every day.  Use these sticks every morning when you wake up according to the instructions given by the dentist.   You must use these sticks for at least one to two years after radiation therapy.  The reason for that is because it happens so slowly and keeps going on for about two years after radiation therapy.  Your hospital dentist will help you monitor your mouth opening and make sure that it's not getting smaller.  Osteoradionecrosis (ORN) This is a condition where your jaw bone after having had radiation therapy becomes very dry.  It has very little blood supply to keep it alive.  If you develop a cavity that turns into an abscess or an infection then the jaw bone does not have  enough blood supply to help fight the infection.  At this point it is very likely that the infection could cause the death of your jaw bone.  When you have dead bone it has to be removed.  Therefore you might end up having to have surgery to remove part of your jaw bone, the part of the jaw bone that has been affected.   Healing is also a problem if you are to  have surgery in the areas where the bone has had radiation therapy.  The same reasons apply.  If you have surgery you need more blood supply which is not available.  When blood supply and oxygen are not available again, there is a chance for the bone to die.  Occasionally ORN happens on its own with no obvious reason.  This is quite rare.  We believe that patients who continue to smoke and/or drink alcohol have a higher chance of having this bone problem.  Therefore once your jaw bone has had radiation therapy if there are any teeth in that area, you should never have them pulled.  You should also never have any surgery on your teeth or gums in that area unless the oral surgeon or Periodontist is aware of your history of radiation. There is some expensive management techniques that might be used to limit your risks.  The risks for ORN either from infection or spontaneous ( or on it's own) are life long.    TRISMUS  Trismus is a condition where the jaw does not allow the mouth to open as wide as it usually does.  This can happen almost suddenly, or in other cases the process is so slow, it is hard to notice it-until it is too far along.  When the jaw joints and/or muscles have been exposed to radiation treatments, the onset of Trismus is very slow.  This is because the muscles are losing their stretching ability over a long period of time, as long as 2 YEARS after the end of radiation.  It is therefore important to exercise these muscles and joints.  TRISMUS EXERCISES   Stack of tongue depressors measuring the same or a little less than the last  documented MIO (Maximum Interincisal Opening).  Secure them with a rubber band on both ends.  Place the stack in the patient's mouth, supporting the other end.  Allow 30 seconds for muscle stretching.  Rest for a few seconds.  Repeat 3-5 times  For all radiation patients, this exercise is recommended in the mornings and evenings unless otherwise instructed.  The exercise should be done for a period of 2 YEARS after the end of radiation.  MIO should be checked routinely on recall dental visits by the general dentist or the hospital dentist.  The patient is advised to report any changes, soreness, or difficulties encountered when doing the exercises.  FLUORIDE TRAYS PATIENT INSTRUCTIONS    Obtain prescription from the pharmacy.  Don't be surprised if it needs to be ordered.  Be sure to let the pharmacy know when you are close to needing a new refill for them to have it ready for you without interruption of Fluoride use.  The best time to use your Fluoride is before bed time.  You must brush your teeth very well and floss before using the Fluoride in order to get the best use out of the Fluoride treatments.  Place 1 drop of Fluoride gel per tooth in the tray.  Place the tray on your lower teeth and your upper teeth.  Make sure the trays are seated all the way.  Remember, they only fit one way on your teeth.  Insert for 5 full minutes.  At the end of the 5 minutes, take the trays out.  SPIT OUT excess. .  Do NOT rinse your mouth!  Do NOT eat or drink after treatments for at least 30 minutes.  This is why the best time for your  treatments is before bedtime.  Clean the inside of your Fluoride trays using COLD WATER and a toothbrush.  In order to keep your Trays from discoloring and free from odors, soak them overnight in denture cleaners such as Efferdent.  Do not use bleach or non denture products.  Store the trays in a safe dry place AWAY from any heat until your next  treatment.  If anything happens to your Fluoride trays, or they don't fit as well after any dental work, please let us know as soon as possible.

## 2015-06-03 NOTE — Telephone Encounter (Signed)
I received a call from Kevin Mcclain letting me know that he placed the Fentanyl 25 mcg patch on Saturday, and his pain has progressively gotten worse over the weekend.  His throat pain has returned and has been as high as 7-8/10, even affecting his ability to sleep.    We discussed increasing his Fentanyl patch either to 37 mcg (25 mcg + 12 mcg as 2 separate patches) or back to 50 mcg.  Based on his increased pain, I decided to increase his Fentanyl patch back to 50 mcg for the next 1-2 weeks.  I let him know that it may take about 24 hours for the increased Fentanyl dose to return to steady state in his body.  I encouraged him to use the oxycodone as needed for breakthrough pain, but later in the week to try to begin to decrease the amount of oxycodone he is using each time.  He tells me that he was already beginning to use less until Saturday when his pain got worse with the 25 mcg patch.    I have left the new prescription (Fentanyl 50 mcg patch transdermal, Q72 hours, #5, no refills) at the radiation oncology nursing station for them to pick up at their convenience.    I encouraged Kevin Mcclain to call me later in the week to let me know how he is doing. He voiced understanding and agreed.   [name], NP [place] [phone]

## 2015-06-03 NOTE — Progress Notes (Signed)
06/03/2015  Patient Name:   Kevin Mcclain Date of Birth:   1945/03/18 Medical Record Number: [id number]  BP 132/52 mmHg  Pulse 67  Temp(Src) 98.2 F (36.8 C) (Oral)  Wt 125 lb (56.7 kg)  [name] presents for oral examination after radiation therapy. Patient completed radiation treatments from 12/03/14 thru 01/18/15. Patient developed significant post radiation therapy side effects and illness that prevented dental follow-up until this time. Patient currently indicates that he is feeling "much better".  REVIEW OF CHIEF COMPLAINTS: DRY MOUTH: Yes HARD TO SWALLOW: Yes, at times.  HURT TO SWALLOW: Yes, at times. TASTE CHANGES: Patient still has not had all his taste return. SORES IN MOUTH: Patient denies sores in his mouth. TRISMUS: Patient without trismus symptoms. WEIGHT: Patient currently 125 pounds and has recently gained 10 pounds. Patient still has feeding tube.  HOME [place] REGIMEN:  BRUSHING: 3 times a day FLOSSING: Not flossing. Flossing was encouraged at bedtime. RINSING: Using salt water and baking soda rinses, and Biotene rinses. FLUORIDE: Is just starting to use fluoride at bedtime again. Patient again encouraged to use fluoride to prevent dental caries. A prescription for PreviDent 5000 Plus was ordered for the patient and e-scribed to [facility] outpatient pharmacy. TRISMUS EXERCISES:  Maximum interincisal opening: 32 mm down from original 35 mm. Use trismus exercises was highly recommended.   DENTAL EXAM:  Oral Hygiene:(PLAQUE): Good oral hygiene. LOCATION OF MUCOSITIS: None noted DESCRIPTION OF SALIVA: Dry mouth. Mild to moderate xerostomia.   ANY EXPOSED BONE: None noted OTHER WATCHED AREAS: Previous extraction sites numbers 2 and 31. Crown margins on the distal of #9 and the mesial distal of #22. DX: Xerostomia, Dysgeusia, Dysphagia and Odynophagia  RECOMMENDATIONS: 1. Brush after meals and at bedtime.  Use fluoride at bedtime. 2. Use trismus exercises  as directed. 3. Use Biotene Rinse or salt water/baking soda rinses. 4. Multiple sips of water as needed. 5. Return to Dr. Lavone Neri, per patient request, for exam and cleaning and evaluation for other dental treatment as needed.  Call if problems before then.  Lenn Cal, DDS

## 2015-06-05 ENCOUNTER — Telehealth: Payer: Self-pay | Admitting: Internal Medicine

## 2015-06-05 ENCOUNTER — Telehealth: Payer: Self-pay | Admitting: *Deleted

## 2015-06-05 MED FILL — oxyCODONE HCL 5 MG/5ML SOLN: 5 | 19 days supply | Qty: 1750 | Fill #0

## 2015-06-05 MED FILL — fentaNYL 50 MCG/HR PT72: 50 | 15 days supply | Qty: 5 | Fill #0

## 2015-06-05 NOTE — Telephone Encounter (Signed)
Dr hunter this pt would like to est with you from dr Shawna Orleans. Can I sch this pt in 30 min slot this year. Pt has CA

## 2015-06-05 NOTE — Telephone Encounter (Addendum)
  Oncology Nurse Navigator Documentation  Navigator Location: [facility] Onc (06/05/15 1743) Navigator Encounter Type: Telephone (06/05/15 1743) Telephone: Lahoma Crocker Call;Appt Confirmation/Clarification (06/05/15 1743) Abnormal Finding Date: 10/12/14 (06/05/15 1743) Confirmed Diagnosis Date: 10/12/14 (06/05/15 1743)   Treatment Initiated Date: 12/03/14 (06/05/15 1743)   Treatment Phase: Post-Tx Follow-up (06/05/15 1743)       Called Kevin Mcclain to confirm his attendance at next week's [facility] with arrival time of 0800 to Radiation Waiting.  He voiced understanding.  I noted he will be seen by Nutrition, PT and SLP.  He adamantly denied the need to be seen by SLP, voiced understanding of my explanation that maintenance of swallowing function is needed for future removal of PEG and ongoing execution of swallowing exercises is necessary to avoid muscle of fibrosis in future.  Despite my encouragement, he reiterated he did not want to see SLP.  Gayleen Orem, RN, BSN, [place] [facility] [place] [phone]                         Time Spent with Patient: 15 (06/05/15 1743)

## 2015-06-05 NOTE — Telephone Encounter (Signed)
See below

## 2015-06-05 NOTE — Telephone Encounter (Signed)
Yes, may schedule any 8 15 8  45 1 15 or 1 45 slot within 3 months- I looked and looks like we have some openings.

## 2015-06-05 NOTE — Telephone Encounter (Signed)
Pt said he can not wait until July 2017. Pt is getting over throat CA and needs refill on medications etc. I told pt we can refill his med until he est w/hunter. Pt said he will get with carl gessner

## 2015-06-06 ENCOUNTER — Other Ambulatory Visit: Payer: Self-pay | Admitting: *Deleted

## 2015-06-06 DIAGNOSIS — C099 Malignant neoplasm of tonsil, unspecified: Secondary | ICD-10-CM

## 2015-06-06 NOTE — Telephone Encounter (Signed)
Constance Holster I said within 3 months- if there is something this month that time frame that is fine. Isn't there an 8:15 on Monday? He can use that for example.

## 2015-06-06 NOTE — Telephone Encounter (Addendum)
Pt wife is aware appt on 06-10-15 815 am

## 2015-06-06 NOTE — Telephone Encounter (Signed)
See below

## 2015-06-07 ENCOUNTER — Telehealth: Payer: Self-pay | Admitting: *Deleted

## 2015-06-07 NOTE — Telephone Encounter (Signed)
  Oncology Nurse Navigator Documentation  Navigator Location: [facility] (06/07/15 1709) Navigator Encounter Type: Telephone (06/07/15 1709) Telephone: Lahoma Crocker Call;Appt Confirmation/Clarification (06/07/15 1709)       Returned patient wife's call, confirmed he should arrive for next Tuesday's [place] by 0800.  She voiced understanding.  Gayleen Orem, RN, BSN, [place] at [place] [phone]                                     Time Spent with Patient: 15 (06/07/15 1709)

## 2015-06-10 ENCOUNTER — Ambulatory Visit (INDEPENDENT_AMBULATORY_CARE_PROVIDER_SITE_OTHER): Payer: Medicare Other | Admitting: Family Medicine

## 2015-06-10 ENCOUNTER — Encounter: Payer: Self-pay | Admitting: Family Medicine

## 2015-06-10 VITALS — BP 140/68 | HR 73 | Temp 98.5°F | Wt 130.0 lb

## 2015-06-10 DIAGNOSIS — I10 Essential (primary) hypertension: Secondary | ICD-10-CM | POA: Diagnosis not present

## 2015-06-10 DIAGNOSIS — I2510 Atherosclerotic heart disease of native coronary artery without angina pectoris: Secondary | ICD-10-CM | POA: Diagnosis not present

## 2015-06-10 DIAGNOSIS — E782 Mixed hyperlipidemia: Secondary | ICD-10-CM | POA: Diagnosis not present

## 2015-06-10 DIAGNOSIS — K219 Gastro-esophageal reflux disease without esophagitis: Secondary | ICD-10-CM | POA: Insufficient documentation

## 2015-06-10 DIAGNOSIS — Z931 Gastrostomy status: Secondary | ICD-10-CM | POA: Insufficient documentation

## 2015-06-10 DIAGNOSIS — E1142 Type 2 diabetes mellitus with diabetic polyneuropathy: Secondary | ICD-10-CM

## 2015-06-10 NOTE — Assessment & Plan Note (Signed)
S: controlled in past on metoprolol. Takes in evening and admits missed dose last night BP Readings from Last 3 Encounters:  06/10/15 140/68  06/03/15 132/52  05/30/15 129/50  A/P:Continue current meds:  Encouraged compliance, recheck at follow up

## 2015-06-10 NOTE — Patient Instructions (Addendum)
Please call to schedule your eye exam. They can fax [place] results.   No changes today

## 2015-06-10 NOTE — Progress Notes (Signed)
Garret Reddish, MD  Subjective:  Kevin Mcclain is a 70 y.o. year old very pleasant male patient who presents for/with See problem oriented charting ROS- No chest pain or shortness of breath. No headache or blurry vision.   Past Medical History-  Patient Active Problem List   Diagnosis Date Noted  . G tube feedings ([place]) 06/10/2015    Priority: High  . 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 Osteoarthritis 11/18/2010    Priority: High  . CAD (coronary artery disease) 10/11/2008    Priority: High  . Type 2 diabetes mellitus with peripheral neuropathy ([place]) 01/24/2007    Priority: High  . Gout 03/28/2007    Priority: Medium  . HYPERLIPIDEMIA 01/24/2007    Priority: Medium  . Essential hypertension 01/24/2007    Priority: Medium  . GERD (gastroesophageal reflux disease) 06/10/2015    Priority: Low  . Arthropathy 03/10/2013    Priority: Low  . Low back pain 11/26/2011    Priority: Low  . Kidney stone 11/04/2011    Priority: Low    Medications- reviewed and updated Current Outpatient Prescriptions  Medication Sig Dispense Refill  . fentaNYL (DURAGESIC - DOSED MCG/HR) 50 MCG/HR Place 1 patch (50 mcg total) onto the skin every 3 (three) days. 5 patch 0  . gabapentin (NEURONTIN) 300 MG capsule Take 1 capsule (300 mg total) by mouth 3 (three) times daily. Take 1 capsule (300 mg) on Day 1. Next, take 1 capsule (300 mg) twice daily on Day 2. Then, take 1 capsule (300 mg) three times daily on Day 3 and all days thereafter. 90 capsule 2  . metoprolol succinate (TOPROL-XL) 25 MG 24 hr tablet Take 25 mg by mouth daily.    . Nutritional Supplements (FEEDING SUPPLEMENT, VITAL 1.5 CAL,) LIQD Begin Vital 1.5 at 20 cc/hr continuously on Friday, 1-24.  If tolerated, increase to 25 cc/hr day 3 and 30 cc/hr on Day 4. Flush with 120 cc free water QID plus and additional 720 cc water by mouth daily.  Please send supplies to home and instruct patient on continuous feeding pump. 1422 mL    . OVER THE COUNTER MEDICATION Place 1 drop into both eyes 3 (three) times daily as needed (dry eyes/ irritation). Clear Cooling Eye Drops    . pantoprazole (PROTONIX) 40 MG tablet Take 1 tablet (40 mg total) by mouth daily. 30 tablet 5  . sodium fluoride (PREVIDENT 5000 PLUS) 1.1 % CREA dental cream Apply to tooth brush. Brush teeth for 2 minutes. Spit out excess-DO NOT swallow. Repeat nightly. 1 Tube prn  . nitroGLYCERIN (NITROSTAT) 0.4 MG SL tablet Place 1 tablet (0.4 mg total) under the tongue every 5 (five) minutes as needed. (Patient not taking: Reported on 06/10/2015) 50 tablet 11  . ondansetron (ZOFRAN-ODT) 8 MG disintegrating tablet Take 1 tablet (8 mg total) by mouth every 8 (eight) hours as needed for nausea or vomiting. (Patient not taking: Reported on 06/10/2015) 30 tablet 5  . oxyCODONE (ROXICODONE) 5 MG/5ML solution Take 10 mLs (10 mg total) by mouth every 4 (four) hours as needed for moderate pain or severe pain. Take 10-15 mLs (10 mg-15 mg) by mouth every 4 hours as needed for pain. (Patient not taking: Reported on 06/10/2015) 1750 mL 0   No current facility-administered medications for this visit.    Objective: BP 140/68 mmHg  Pulse 73  Temp(Src) 98.5 F (36.9 C)  Wt 130 lb (58.968 kg) Gen: NAD, resting comfortably CV: RRR no murmurs  rubs or gallops Lungs: CTAB no crackles, wheeze, rhonchi Abdomen: soft/nontender/nondistended/normal bowel sounds. No rebound or guarding. g tube in place. Did not inspect skin by removing dressing Ext: no edema Skin: warm, dry Neuro: grossly normal, moves all extremities  Diabetic Foot Exam - Simple   Simple Foot Form  Diabetic Foot exam was performed with the following findings:  Yes 06/10/2015 12:16 PM  Visual Inspection  No deformities, no ulcerations, no other skin breakdown bilaterally:  Yes  Sensation Testing  Intact to touch and monofilament testing bilaterally:  Yes  Pulse Check  Posterior Tibialis and Dorsalis pulse intact  bilaterally:  Yes  Comments     Assessment/Plan:  Essential hypertension S: controlled in past on metoprolol. Takes in evening and admits missed dose last night BP Readings from Last 3 Encounters:  06/10/15 140/68  06/03/15 132/52  05/30/15 129/50  A/P:Continue current meds:  Encouraged compliance, recheck at follow up  HYPERLIPIDEMIA S: previously controlled on no statin. No myalgias.  Lab Results  Component Value Date   CHOL 117 12/04/2013   HDL 37.40* 12/04/2013   LDLCALC 53 12/04/2013   TRIG 132.0 12/04/2013   CHOLHDL 3 12/04/2013   A/P: will get lipids at follow up. Strongly consider restarting statin given CAD history, though if LDL <50 may wait for cards opinion  CAD (coronary artery disease) S:History of angioplasty age 68, CABG x6 in 2005 at [place].  ASA 81 need to restart.  A/P: had been taken off aspirin and statin with cancer treatment- now doing better. We contemplated restarting statin and aspirin but opted for 3 month follow up ultimately. Also we deferred referral back to cards (last seen in 2012) to that time.    Type 2 diabetes mellitus with peripheral neuropathy ([facility]) S: well controlled. On no medicine since radiation treatment Lab Results  Component Value Date   HGBA1C 5.4 04/19/2015   HGBA1C 6.0 10/10/2014   HGBA1C 6.2 12/04/2013   A/P: weight is increasing and now on tube feeds- repeat a1c in 3 months and consider restarting metformin on previously.    Return in about 3 months (around 09/09/2015).Labs: HCV, lipid, a1c at follow up. Consider updating PSA.  May add CBC, CMET.  Return precautions advised.   Meds ordered this encounter  Medications  . metoprolol succinate (TOPROL-XL) 25 MG 24 hr tablet    Sig: Take 25 mg by mouth daily.

## 2015-06-10 NOTE — Assessment & Plan Note (Signed)
S: previously controlled on no statin. No myalgias.  Lab Results  Component Value Date   CHOL 117 12/04/2013   HDL 37.40* 12/04/2013   LDLCALC 53 12/04/2013   TRIG 132.0 12/04/2013   CHOLHDL 3 12/04/2013   A/P: will get lipids at follow up. Strongly consider restarting statin given CAD history, though if LDL <50 may wait for cards opinion

## 2015-06-10 NOTE — Assessment & Plan Note (Signed)
S: well controlled. On no medicine since radiation treatment Lab Results  Component Value Date   HGBA1C 5.4 04/19/2015   HGBA1C 6.0 10/10/2014   HGBA1C 6.2 12/04/2013   A/P: weight is increasing and now on tube feeds- repeat a1c in 3 months and consider restarting metformin on previously.

## 2015-06-10 NOTE — Assessment & Plan Note (Signed)
S:History of angioplasty age 70, CABG x6 in 2005 at [place].  ASA 81 need to restart.  A/P: had been taken off aspirin and statin with cancer treatment- now doing better. We contemplated restarting statin and aspirin but opted for 3 month follow up ultimately. Also we deferred referral back to cards (last seen in 2012) to that time.

## 2015-06-11 ENCOUNTER — Encounter: Payer: Self-pay | Admitting: *Deleted

## 2015-06-11 ENCOUNTER — Ambulatory Visit: Payer: Medicare Other | Attending: Radiation Oncology | Admitting: Physical Therapy

## 2015-06-11 ENCOUNTER — Other Ambulatory Visit: Payer: Self-pay | Admitting: Adult Health

## 2015-06-11 ENCOUNTER — Encounter: Payer: Self-pay | Admitting: Adult Health

## 2015-06-11 ENCOUNTER — Ambulatory Visit: Payer: Medicare Other | Admitting: Nutrition

## 2015-06-11 ENCOUNTER — Ambulatory Visit
Admission: RE | Admit: 2015-06-11 | Discharge: 2015-06-11 | Disposition: A | Discharge status: Home / Self Care | Payer: Medicare Other | Source: Ambulatory Visit | Attending: Radiation Oncology | Admitting: Radiation Oncology

## 2015-06-11 VITALS — BP 134/57 | HR 71 | Temp 98.2°F | Resp 100 | Ht 64.0 in | Wt 128.3 lb

## 2015-06-11 DIAGNOSIS — R29898 Other symptoms and signs involving the musculoskeletal system: Secondary | ICD-10-CM | POA: Diagnosis present

## 2015-06-11 DIAGNOSIS — I890 Lymphedema, not elsewhere classified: Secondary | ICD-10-CM

## 2015-06-11 DIAGNOSIS — R110 Nausea: Secondary | ICD-10-CM

## 2015-06-11 DIAGNOSIS — K117 Disturbances of salivary secretion: Secondary | ICD-10-CM

## 2015-06-11 DIAGNOSIS — C099 Malignant neoplasm of tonsil, unspecified: Secondary | ICD-10-CM

## 2015-06-11 DIAGNOSIS — E46 Unspecified protein-calorie malnutrition: Secondary | ICD-10-CM | POA: Insufficient documentation

## 2015-06-11 DIAGNOSIS — R634 Abnormal weight loss: Secondary | ICD-10-CM | POA: Insufficient documentation

## 2015-06-11 DIAGNOSIS — R682 Dry mouth, unspecified: Secondary | ICD-10-CM

## 2015-06-11 MED ORDER — PILOCARPINE HCL 5 MG PO TABS: 5.0000 mg | ORAL_TABLET | Freq: Two times a day (BID) | ORAL | Status: DC

## 2015-06-11 MED ORDER — ONDANSETRON HCL 4 MG PO TABS: 4.0000 mg | ORAL_TABLET | Freq: Three times a day (TID) | ORAL | Status: DC | PRN

## 2015-06-11 MED FILL — PILOCARPINE HCL 5 MG TABLET: 5 | 30 days supply | Qty: 60 | Fill #0

## 2015-06-11 MED FILL — ONDANSETRON HCL 4 MG TABLET: 4 | 6 days supply | Qty: 20 | Fill #0

## 2015-06-11 NOTE — Progress Notes (Signed)
Patient was seen in [facility] for follow up.  Current weight documented as 128.3 pounds with current BMI 22.01. This is increased from 115 pounds March 9. Patient reports he is giving 5 cans of Osmolite 1.5 overnight, adjusting rate based on tolerance. He is beginning to eat small amounts throughout the day, including cereal, ice cream, etc. Reports goal weight is 140-145 pounds. Complains of dry mouth, especially first thing in the morning.  Estimated Nutrition Needs: 1775-2000 kcal, 72-85 grams protein, greater than 2 L daily TF provides 1775 kcal, 75 grams protein, 905 mL free water. Free water flushes and water by mouth provide additional 1200 cc free water.  Nutrition Diagnosis: Unintended weight loss improved.  Intervention: Educated patient to continue nocturnal TF. Recommended patient begin to eat small amounts at mealtimes. Suggested patient add Ensure Plus or Boost Plus as tolerated. Reviewed process of increasing oral intake and decreasing TF once po intake improves and weight increases. Recommended adding salagen to evaluate if it improves dry mouth.  NP to order if appropriate. Recommended patient try rinsing mouth with coconut oil. Questions answered and teach back method used.  Monitoring, Evaluation, Goals: Patient will tolerate increase in oral intake and continue current TF to promote weight gain.  Next Visit:  To be scheduled in about 4 weeks.

## 2015-06-11 NOTE — Therapy (Signed)
[place], [place], [address] Phone: [phone]   Fax:  [phone]  Physical Therapy Evaluation  Patient Details  Name: Kevin Mcclain MRN: [id number] Date of Birth: 06/26/1945 Referring Provider: Dr. Eppie Gibson  Encounter Date: 06/11/2015      PT End of Session - 06/11/15 1339    Visit Number 1   Number of Visits 7   Date for PT Re-Evaluation 07/30/15   PT Start Time 0805   PT Stop Time 0840   PT Time Calculation (min) 35 min   Activity Tolerance Patient tolerated treatment well   Behavior During Therapy [facility] for tasks assessed/performed      Past Medical History  Diagnosis Date  . Arthritis   . Diabetes mellitus type II   . Kidney stones   . CAD (coronary artery disease)     severe CAD-s/p MI 2000 with PTCA, s/p CABG 2005-by Dr Vanetta Mulders at [place] EF 50-55%  . Hypertension   . Hyperlipidemia   . Gout   . Vertigo   . 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 Allergy   . GERD (gastroesophageal reflux disease)     zantac- prn  . Shingles outbreak 04/23/15    left shoulder and left arm    Past Surgical History  Procedure Laterality Date  . Coronary artery bypass graft  2005    6 grafts  . Cholecystectomy  2012  . Colonoscopy w/ polypectomy  2015  . Cystoscopy      kidney stone removal  . Lithotripsy    . Nasal sinus surgery Right 11/14/2014    Procedure: RIGHT ENDOSCOPIC ANTROSTOMY AND ANTERIOR ETHMOIDECTOMY ;  Surgeon: Jodi Marble, MD;  Location: [place];  Service: ENT;  Laterality: Right;  . Multiple extractions with alveoloplasty N/A 11/14/2014    Procedure: Extraction of tooth #2 and 31 with alveoloplasty after sectioning of bridge at distal #29 with full mouth debridement of remaining dention;  Surgeon: Lenn Cal, DDS;  Location: [place];  Service: Oral Surgery;  Laterality: N/A;    There were no vitals filed for this visit.        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  Recent shingles affecting left arm; type II DM, CAD s/p MI 2000 and s/p CABG 2005; HTN, gout, vertigo   Patient Stated Goals get info from all head & neck clinic providers   Currently in Pain? Yes   Pain Score 3   up to 7   Pain Location Throat   Pain Descriptors / Indicators Sore   Aggravating Factors  dryness   Pain Relieving Factors pain meds            [facility] PT Assessment - 06/11/15 0001    Assessment   Medical Diagnosis tonsil cancer, stage II   Referring Provider Dr. Eppie Gibson   Precautions   Precautions Other (comment)   Precaution Comments cancer precautions; still with feeding tube   Restrictions   Weight Bearing Restrictions No   Balance Screen   Has the patient fallen in the past 6 months No   Has the patient had a decrease in activity level because of a fear of falling?  No   Is the patient  reluctant to leave their home because of a fear of falling?  No   Home Environment   Living Environment Private residence   Living Arrangements Spouse/significant other   Type of [place] Two level;Able to live on main level with bedroom/bathroom   Prior Function   Level of Independence Independent   Vocation Retired   Leisure no regular exercise; works in his yard   Observation/Other Assessments   Education officer, museum Tests   Functional tests Sit to Stand   Sit to Stand   Comments 14 times in 30 seconds  legs feel tired after that   Posture/Postural Control   Posture/Postural Control Postural limitations   Postural Limitations Forward head  slight   ROM / Strength   AROM / PROM / Strength AROM;Strength   AROM   Overall AROM Comments Neck AROM limited in extension 10%,  sidebend 50%, and rotation 25%; shoulder AROM WFL bilat but with slight limitations in flexion and abduction   Strength   Overall Strength Comments shoulders grossly 5/5 bilat. but patient reports feeling weak since losing weight during treatment; also reports that his hands shake and would like help with that   Ambulation/Gait   Ambulation/Gait Yes   Ambulation/Gait Assistance 7: Independent  per patient report   Assistive device None           LYMPHEDEMA/ONCOLOGY QUESTIONNAIRE - 06/11/15 1337    Type   Cancer Type tonsil, stage II   Treatment   Past Chemotherapy Treatment Yes   Past Radiation Treatment Yes   Date 01/18/15   Body Site neck   Lymphedema Assessments   Lymphedema Assessments Head and Neck   Head and Neck   4 cm superior to sternal notch around neck 36.2 cm   6 cm superior to sternal notch around neck 36.8 cm   8 cm superior to sternal notch around neck 38.3 cm   Other at 10 cm. superior to sternal notch, 40.1   Other main level of swelling is at about 8 cm. superior to sternal notch                        PT Education - 06/11/15 1339    Education provided Yes   Education Details posture, neck ROM, walking, lymphedema, PT info   Person(s) Educated Patient   Methods Explanation;Handout   Comprehension Verbalized understanding                [place] - 06/11/15 1413    CC Long Term Goal  #1   Title Patient or his wife will be independent in self-manual lymph drainage.   Baseline 3   Period Weeks   Status New   CC Long Term Goal  #2   Title Patient will be knowledgeable about manufactured compression garment options.   Time 3   Period Weeks   Status New   CC Long Term Goal  #3   Title Patient will be independent in HEP for neck ROM and for hand strengthening.   Time 3   Period Weeks   Status New            Plan - 06/11/15 1406    Clinical Impression Statement Pt. with h/o stage II tonsil cancer  completed XRT on 01/18/15.  He has mild neck lymphedema and reports weakness in his hands making his hands unsteady when he uses them; reports this is from  neuropathy.  He has neck AROM limitations most directions, with significant limitation in sidebend bilat.  He has slight forward head posture.  Some fatigue after doing 14 reps sit to stand in 30 seconds.   Rehab Potential Good   Clinical Impairments Affecting Rehab Potential hand neuropathy   PT Frequency 1x / week   PT Duration 6 weeks   PT Treatment/Interventions ADLs/Self Care Home Management;Electrical Stimulation;DME Instruction;Therapeutic exercise;Patient/family education;Manual techniques;Manual lymph drainage;Compression bandaging;Passive range of motion   PT Next Visit Plan Make foam chip pack for neck compression; check hand strength with dynamometer; begin manual lymph drainage and instructing patient's wife in same.  Discuss manufactured compression garment availability with patient.     PT Home Exercise Plan neck ROM, walking or other exercise   Recommended Other Services possible OT referral for hand strength and coordination issues s/p chemotherapy   Consulted and Agree with Plan of Care Patient      Patient will benefit from skilled therapeutic intervention in order to improve the following deficits and impairments:  Increased edema, Decreased knowledge of use of DME, Impaired UE functional use, Decreased coordination, Decreased range of motion  Visit Diagnosis: Lymphedema, not elsewhere classified  Other symptoms and signs involving the musculoskeletal system      G-Codes - 2015-06-21 1414    Functional Assessment Tool Used clinical judgement   Functional Limitation Self care   Self Care Current Status [id number]) At least 40 percent but less than 60 percent impaired, limited or restricted   Self Care Goal Status [id number]) At least 1 percent but less than 20 percent impaired, limited or restricted       Problem  List Patient Active Problem List   Diagnosis Date Noted  . G tube feedings ([place]) 06/10/2015  . GERD (gastroesophageal reflux disease) 06/10/2015  . Tonsil cancer ([place]) 10/31/2014  . Arthropathy 03/10/2013  . Low back pain 11/26/2011  . Kidney stone 11/04/2011  . Osteoarthritis 11/18/2010  . CAD (coronary artery disease) 10/11/2008  . Gout 03/28/2007  . Type 2 diabetes mellitus with peripheral neuropathy ([place]) 01/24/2007  . HYPERLIPIDEMIA 01/24/2007  . Essential hypertension 01/24/2007    SALISBURY,DONNA 21-Jun-2015, 2:16 PM  [place], [place], [address] Phone: [phone]   Fax:  [phone]  Name: Kevin Mcclain MRN: [id number] Date of Birth: [birth year]   Serafina Royals, PT Jun 21, 2015 2:16 PM

## 2015-06-11 NOTE — Progress Notes (Signed)
I briefly saw Kevin Mcclain today during his [place] visit today.  He tells me that his pain is doing much better, since we went back up on the Fentanyl patch.  He is using less of the oxycodone liquid for breakthrough pain (only 2-3 times per day, which is an improvement from previous).  He is complaining of xerostomia and some continued nausea first thing in the morning when flushing his feeding tube with water.  He has weaned himself off of the Neurontin, as the shingles pain is largely gone now.   Therefore, I refilled his Zofran 4 mg tabs to be used prn.  I also ordered Salagen 5 mg po BID for him to try for the xerostomia.  I let him know that the Salagen tablets could be crushed and put in his G-tube, if he couldn't swallow them; he thinks he can swallow them without difficulty.  Both prescriptions sent to [facility].   I will give Mr. Renno a call towards the end of April to set up a follow-up appt with me as we continue to decrease his opiate use.  He knows to call me with any other questions or concerns.   [name], NP [place] [phone]

## 2015-06-14 ENCOUNTER — Telehealth: Payer: Self-pay | Admitting: Adult Health

## 2015-06-14 NOTE — Telephone Encounter (Signed)
I received a call from [name] letting me know that he has received plenty of his tube feeding formula, but he only has about 7 feeding bags left.  He tried to call [facility]) to order more bags only, but they told him that they needed an additional order from the cancer center to be able to supply him the bags.  He called me for help with getting this addressed.    I made a call to Kevin Mcclain at [facility] and then was able to speak with Kevin Mcclain.  Kevin Mcclain contacted their supply team and returned my call.    According to the supply/delivery team, since the patient reports to only be using 4 cans/day, they needed a new order to satisfy that change.  Kevin Mcclain, Kevin Mcclain is out of the office today, so I provided a verbal order to change his tube feeding order from 5.5 cans/day to 4 cans/day.  This is clinically appropriate given the patient is currently consuming more food by mouth.    A verbal order was given to Kevin Glad, RN to change Kevin Mcclain tube feeding orders to 4 cans/day.  I encouraged them to call me with any other questions.  I called Kevin Mcclain to make him aware that things had been taken care of and to call me with any other concerns.   Kevin Craze, NP [place] [phone]

## 2015-06-14 NOTE — Progress Notes (Signed)
Met with Mr. Reifsteck during [facility] [place].    Arrived him to Nursing, provided verbal overview of [place] and the clinicians who he will be seeing him, encouraged him to ask questions during their time with him.  He was seen by Nutrition and PT as scheduled, was seen by Survivorship NP [name] PRN to address use of Salagen.  Spoke with him/her at end of [facility], he denied any questions. He understands he can contact me or [place] with questions/concerns.  Gayleen Orem, RN, BSN, [place] at [place] [phone]

## 2015-06-18 ENCOUNTER — Ambulatory Visit: Payer: Medicare Other | Admitting: Physical Therapy

## 2015-06-19 ENCOUNTER — Ambulatory Visit: Payer: Self-pay | Admitting: Internal Medicine

## 2015-06-24 ENCOUNTER — Other Ambulatory Visit: Payer: Self-pay | Admitting: Adult Health

## 2015-06-24 ENCOUNTER — Telehealth: Payer: Self-pay | Admitting: Adult Health

## 2015-06-24 DIAGNOSIS — C099 Malignant neoplasm of tonsil, unspecified: Secondary | ICD-10-CM

## 2015-06-24 DIAGNOSIS — B029 Zoster without complications: Secondary | ICD-10-CM

## 2015-06-24 MED ORDER — FENTANYL 25 MCG/HR TD PT72: 25.0000 ug | MEDICATED_PATCH | TRANSDERMAL | Status: DC

## 2015-06-24 MED ORDER — OXYCODONE HCL 5 MG/5ML PO SOLN: 10.0000 mg | ORAL | Status: DC | PRN

## 2015-06-24 NOTE — Telephone Encounter (Signed)
I received a call from Kevin Mcclain with questions regarding his follow-up with me.  I let him know that I was actually planning on calling him today, but am glad he called.  He tells me that he is taking the oxycodone about 3 times per day (he's taking the 10 mg twice during the day and the 15 mg once at night when he awakes in pain).  He remains on the Fentanyl 50 mcg patch.  He overall feels well, with the exception of struggling with some seasonal allergies he relates to the pollen. He tells me his weight is stable and gradually increasing.   Rather than coming in for a formal visit this week, I will refill his pain medications with the following recommendations.  -Decrease Fentanyl patch to 25 mcg. (prescription written today; #10, no refills and left at [facility] [facility] for pt to pick up at his convenience).  -Continue Oxycodone solution for breakthrough pain as previously prescribed (prescription refilled today and left at [place])  At our last attempt to decrease his Fentanyl patch, he experienced worsening pain and we had to go back up on the patch dose.  I am hopeful he will tolerate the reduction this time, since it has been nearly a month since our last alteration in pain medications.  He knows to call me if he experiences more pain and I will consider adding on a 12 mcg patch (to total 37.5 mcg).  He voiced understanding and agrees with this plan.    If all goes well with the reduction in the Fentanyl patch, then I will plan to see him in about 3-4 weeks to further work on reducing his pain medications.  He knows to call me with any questions or concerns.   [name], NP [place] [phone]

## 2015-06-24 NOTE — Progress Notes (Signed)
I received a call from Kevin Mcclain with questions regarding his follow-up with me.  I let him know that I was actually planning on calling him today, but am glad he called.  He tells me that he is taking the oxycodone about 3 times per day (he's taking the 10 mg twice during the day and the 15 mg once at night when he awakes in pain).  He remains on the Fentanyl 50 mcg patch.  He overall feels well, with the exception of struggling with some seasonal allergies he relates to the pollen. He tells me his weight is stable and gradually increasing.   Rather than coming in for a formal visit this week, I will refill his pain medications with the following recommendations.  -Decrease Fentanyl patch to 25 mcg. (prescription written today and left at [facility] for pt to pick up at his convenience).  -Continue Oxycodone solution for breakthrough pain as previously prescribed (prescription refilled today and left at [place])  At our last attempt to decrease his Fentanyl patch, he experienced worsening pain and we had to go back up on the patch dose.  I am hopeful he will tolerate the reduction this time, since it has been nearly a month since our last alteration in pain medications.  He knows to call me if he experiences more pain and I will consider adding on a 12 mcg patch (to total 37.5 mcg).  He voiced understanding and agrees with this plan.    If all goes well with the reduction in the Fentanyl patch, then I will plan to see him in about 1 month to further work on reducing his pain medications.  He knows to call me with any questions or concerns.   [name], NP [place] [phone]

## 2015-06-25 MED FILL — oxyCODONE HCL 5 MG/5ML SOLN: 5 | 19 days supply | Qty: 1750 | Fill #0

## 2015-06-26 ENCOUNTER — Ambulatory Visit: Payer: Medicare Other | Admitting: Physical Therapy

## 2015-06-26 DIAGNOSIS — I890 Lymphedema, not elsewhere classified: Secondary | ICD-10-CM

## 2015-06-26 NOTE — Therapy (Signed)
[place], [place], [address] Phone: [phone]   Fax:  [phone]  Physical Therapy Treatment  Patient Details  Name: Kevin Mcclain MRN: [id number] Date of Birth: Sep 19, 1945 Referring Provider: Dr. Eppie Gibson  Encounter Date: 06/26/2015      PT End of Session - 06/26/15 2053    Visit Number 2   Number of Visits 7   Date for PT Re-Evaluation 07/30/15   PT Start Time 1444   PT Stop Time 1529   PT Time Calculation (min) 45 min   Activity Tolerance Patient tolerated treatment well   Behavior During Therapy [facility] for tasks assessed/performed      Past Medical History  Diagnosis Date  . Arthritis   . Diabetes mellitus type II   . Kidney stones   . CAD (coronary artery disease)     severe CAD-s/p MI 2000 with PTCA, s/p CABG 2005-by Dr Vanetta Mulders at [place] EF 50-55%  . Hypertension   . Hyperlipidemia   . Gout   . Vertigo   . 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 Allergy   . GERD (gastroesophageal reflux disease)     zantac- prn  . Shingles outbreak 04/23/15    left shoulder and left arm    Past Surgical History  Procedure Laterality Date  . Coronary artery bypass graft  2005    6 grafts  . Cholecystectomy  2012  . Colonoscopy w/ polypectomy  2015  . Cystoscopy      kidney stone removal  . Lithotripsy    . Nasal sinus surgery Right 11/14/2014    Procedure: RIGHT ENDOSCOPIC ANTROSTOMY AND ANTERIOR ETHMOIDECTOMY ;  Surgeon: Jodi Marble, MD;  Location: [facility] [facility];  Service: ENT;  Laterality: Right;  . Multiple extractions with alveoloplasty N/A 11/14/2014    Procedure: Extraction of tooth #2 and 31 with alveoloplasty after sectioning of bridge at distal #29 with full mouth debridement of remaining dention;  Surgeon: Lenn Cal, DDS;  Location: [facility];  Service: Oral Surgery;  Laterality: N/A;    There were no vitals filed for this visit.       Subjective Assessment - 06/26/15 1444    Subjective "I don't appreciate being taken back late, now.  I've been through a lot."  Neck swelling goes up and down.   Currently in Pain? Yes   Pain Score 5    Pain Location Throat   Pain Descriptors / Indicators --  uncomfortable, dry   Aggravating Factors  dryness   Pain Relieving Factors water, biotene               LYMPHEDEMA/ONCOLOGY QUESTIONNAIRE - 06/26/15 1451    Head and Neck   4 cm superior to sternal notch around neck 37.8 cm   6 cm superior to sternal notch around neck 37.9 cm   8 cm superior to sternal notch around neck 38.4 cm   Other at 10 cm. superior to sternal notch, 39.5                  [facility] Adult PT Treatment/Exercise - 06/26/15 0001    Self-Care   Self-Care Other Self-Care Comments   Other Self-Care Comments  Foam chips in stockinette had been fashioned for the patient to use as compression on his neck; how this works was explained to him, it was placed on him, and he was instructed in its use.   Manual Therapy  Manual Therapy Edema management;Manual Lymphatic Drainage (MLD)   Edema Management circumference measurements taken   Manual Lymphatic Drainage (MLD) performed in recline, almost long sitting:  supraclavicular fossae, bilateral axillae, shoulder collectors anterior and posterior; posterolateral neck, lateral neck, anterolateral neck, and anterior neck; chin, cheeks, and preauricular areas.  Princliples and techniques for this were explained to patient and his wife prior to and during its performance.                PT Education - 06/26/15 2051    Education provided Yes   Education Details wear foam chip pick snugly but comfortably on swollen area of neck; try to wear 2-4 hours a day while awake; take note of any benefit when it is taken off   Person(s) Educated Patient;Spouse   Methods Explanation;Demonstration   Comprehension Verbalized understanding                 [place] - 06/26/15 2057    CC Long Term Goal  #1   Title Patient or his wife will be independent in self-manual lymph drainage.   Status On-going   CC Long Term Goal  #2   Title Patient will be knowledgeable about manufactured compression garment options.   Status On-going   CC Long Term Goal  #3   Title Patient will be independent in HEP for neck ROM and for hand strengthening.   Status On-going            Plan - 06/26/15 2053    Clinical Impression Statement Patient expressed unhappiness at beginning of session that therapist started him 14 minutes late, but he was willing to proceed.  HIs wife accompanied him today and was given beginning instruction in principles and techniqes of manual lymph drainage without being asked to perform this yet.  She did well, asking appropriate questions and seeming to comprehend very well.   Rehab Potential Good   Clinical Impairments Affecting Rehab Potential hand neuropathy   PT Frequency 1x / week   PT Duration 6 weeks   PT Treatment/Interventions ADLs/Self Care Home Management;Patient/family education;Manual lymph drainage;Manual techniques   PT Next Visit Plan check hand strength with dynamometer; instruct patient or his wife in performing manual lymph drainage; discuss manufactured compression garment options   PT Home Exercise Plan neck ROM, walking or other exercise   Consulted and Agree with Plan of Care Patient      Patient will benefit from skilled therapeutic intervention in order to improve the following deficits and impairments:  Increased edema, Decreased knowledge of use of DME, Impaired UE functional use, Decreased coordination, Decreased range of motion  Visit Diagnosis: Lymphedema, not elsewhere classified     Problem List Patient Active Problem List   Diagnosis Date Noted  . G tube feedings ([place]) 06/10/2015  . GERD (gastroesophageal reflux disease) 06/10/2015  . Tonsil cancer ([place]) 10/31/2014  .  Arthropathy 03/10/2013  . Low back pain 11/26/2011  . Kidney stone 11/04/2011  . Osteoarthritis 11/18/2010  . CAD (coronary artery disease) 10/11/2008  . Gout 03/28/2007  . Type 2 diabetes mellitus with peripheral neuropathy ([place]) 01/24/2007  . HYPERLIPIDEMIA 01/24/2007  . Essential hypertension 01/24/2007    Leimomi Zervas 06/26/2015, 8:58 PM  [place], [place], [address] Phone: [phone]   Fax:  [phone]  Name: Kevin Mcclain MRN: [id number] Date of Birth: 1945-04-09    Serafina Royals, PT 06/26/2015 8:58 PM

## 2015-06-27 MED FILL — PANTOPRAZOLE SOD DR 40 MG T: 40 | 30 days supply | Qty: 30 | Fill #3

## 2015-07-03 ENCOUNTER — Ambulatory Visit: Payer: Medicare Other | Attending: Radiation Oncology | Admitting: Physical Therapy

## 2015-07-03 DIAGNOSIS — I890 Lymphedema, not elsewhere classified: Secondary | ICD-10-CM | POA: Insufficient documentation

## 2015-07-03 MED FILL — fentaNYL 25 MCG/HR PT72: 25 | 30 days supply | Qty: 10 | Fill #0

## 2015-07-03 NOTE — Patient Instructions (Signed)
Sit in front of a mirror. Do 5 slow deep breaths, breathing in through the nose and out through the mouth, letting your belly "inflate" as you breathe in.  1) Place hands on areas just behind collar bones and do 10 stationary circles with stretch in outward directions. 2) Do stationary circles at each armpit about 10 times. 3) Place one hand on the front of the opposite shoulder and do stationary circles with stretch downward toward underarm. 4) Repeat #1 above. 5) Imagine a river runnning in a line from the earlobe straight down the neck.  Place hands just behind this and do circles with stretch coming forward slightly and down, thinking about fluid flowing down that river.  Do 10 times. 6) Place one hand just in front of the river on one side and do circles with a slight back and then downward stretch, thinking again about putting that fluid in the river.  Do 10-20 times on each side. 7) Place one hand just slightly in front of the spot you just did and do the same thing.  DO THIS VERY GENTLY. 8) Use the side of the fingers of your dominant hand to pump downward starting just under the chin and "stair stepping" downward with a stretch, working down to the chest. 9) Repeat #1. 10) Do stationary circles on each side of the face just above the chin, out and down with the stretch 10 times. 11) Do stationary circles on each side of the face on the cheeks with pressure going back and down, 10 times. 12) Do stationary circles on each side of the face between the eyes and ears, again back and downward 10 times. 13) Repeat steps 9,8,7,1,3 and 2 in that order!  Do not slide on the skin, but STRETCH it with your motions. Only give enough pressure to stretch the skin. Make sure to wash your hands prior to doing this. DO THIS SLOWLY, PLEASE!  And do once a day. 

## 2015-07-03 NOTE — Therapy (Addendum)
[place], [place], [address] Phone: [phone]   Fax:  [phone]  Physical Therapy Treatment  Patient Details  Name: Kevin Mcclain MRN: [id number] Date of Birth: 1946-01-15 Referring Provider: Dr. Eppie Gibson  Encounter Date: 07/03/2015      PT End of Session - 07/03/15 1700    Visit Number 3   Number of Visits 7   Date for PT Re-Evaluation 07/30/15   PT Start Time 4163   PT Stop Time 1430   PT Time Calculation (min) 45 min   Activity Tolerance Patient tolerated treatment well   Behavior During Therapy [facility] for tasks assessed/performed      Past Medical History  Diagnosis Date  . Arthritis   . Diabetes mellitus type II   . Kidney stones   . CAD (coronary artery disease)     severe CAD-s/p MI 2000 with PTCA, s/p CABG 2005-by Dr Vanetta Mulders at [place] EF 50-55%  . Hypertension   . Hyperlipidemia   . Gout   . Vertigo   . 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 Allergy   . GERD (gastroesophageal reflux disease)     zantac- prn  . Shingles outbreak 04/23/15    left shoulder and left arm    Past Surgical History  Procedure Laterality Date  . Coronary artery bypass graft  2005    6 grafts  . Cholecystectomy  2012  . Colonoscopy w/ polypectomy  2015  . Cystoscopy      kidney stone removal  . Lithotripsy    . Nasal sinus surgery Right 11/14/2014    Procedure: RIGHT ENDOSCOPIC ANTROSTOMY AND ANTERIOR ETHMOIDECTOMY ;  Surgeon: Jodi Marble, MD;  Location: [place];  Service: ENT;  Laterality: Right;  . Multiple extractions with alveoloplasty N/A 11/14/2014    Procedure: Extraction of tooth #2 and 31 with alveoloplasty after sectioning of bridge at distal #29 with full mouth debridement of remaining dention;  Surgeon: Lenn Cal, DDS;  Location: [place];  Service: Oral Surgery;  Laterality: N/A;    There were no vitals filed for this visit.       Subjective Assessment - 07/03/15 1346    Subjective Trying to come off some of the pain medicines--back off the patches, but it's been hard yesterday and today.  Tried the chip pack and it works.  It feels like it's trying to come off your head.   Currently in Pain? Yes   Pain Score 8    Pain Location Other (Comment)  throat and all over the body   Pain Descriptors / Indicators Burning   Aggravating Factors  decreasing painmeds   Pain Relieving Factors nothing               LYMPHEDEMA/ONCOLOGY QUESTIONNAIRE - 07/03/15 1349    Head and Neck   4 cm superior to sternal notch around neck 36.7 cm   6 cm superior to sternal notch around neck 36.6 cm   8 cm superior to sternal notch around neck 37.8 cm   Other at 10 cm. superior to sternal notch, 39.7                  [facility] Adult PT Treatment/Exercise - 07/03/15 0001    Self-Care   Other Self-Care Comments  Briefly showed patient a [employer] neck garment, which he is interested in.  He wanted info about where to obtain one, so this  was written down and given to him.  Info also given on [facility] and [facility].   Manual Therapy   Manual Therapy Myofascial release   Edema Management circumference measurements taken   Manual Lymphatic Drainage (MLD) performed in recline, almost long sitting:  supraclavicular fossae, bilateral axillae, shoulder collectors anterior and posterior; posterolateral neck, lateral neck, anterolateral neck, and anterior neck; chin, cheeks, and preauricular areas.  Princliples and techniques for this were explained to patient and his wife prior to and during its performance again.  Patient's wife and patient both declined doing the massage with therapist guidance, but wanted written instructions, which were given.                PT Education - 07/03/15 1659    Education provided Yes   Education Details manual lymph drainage procedures; [facility] and [facility] [employer]) Educated Patient;Spouse   Methods Explanation;Handout;Demonstration   Comprehension Verbalized understanding                [place] - 07/03/15 1703    CC Long Term Goal  #1   Title Patient or his wife will be independent in self-manual lymph drainage.   Status Partially Met   CC Long Term Goal  #2   Title Patient will be knowledgeable about manufactured compression garment options.   Status Partially Met   CC Long Term Goal  #3   Title Patient will be independent in HEP for neck ROM and for hand strengthening.   Status On-going            Plan - 07/03/15 1701    Clinical Impression Statement Patient's neck circumference measurements showed good reduction today, other than at the most superior level.  He has noticed the benefit of wearing the chip pack compression, but is interested in a manufactured garment and will pursue this on his own.  He was not sure today whether he will return for another session or not.   Rehab Potential Good   Clinical Impairments Affecting Rehab Potential hand neuropathy   PT Frequency 1x / week   PT Duration 6 weeks   PT Treatment/Interventions ADLs/Self Care Home Management;Patient/family education;Manual lymph drainage;Manual techniques   PT Next Visit Plan check hand strength with dynamometer; instruct patient or his wife in performing manual lymph drainage and have them do this if they choose to; discuss manufactured compression garment options if he has not already purchased one   PT Home Exercise Plan neck ROM, walking or other exercise   Consulted and Agree with Plan of Care Patient      Patient will benefit from skilled therapeutic intervention in order to improve the following deficits and impairments:  Increased edema, Decreased knowledge of use of DME, Impaired UE functional use, Decreased coordination, Decreased range of motion  Visit Diagnosis: Lymphedema, not elsewhere  classified     Problem List Patient Active Problem List   Diagnosis Date Noted  . G tube feedings ([place]) 06/10/2015  . GERD (gastroesophageal reflux disease) 06/10/2015  . Tonsil cancer ([place]) 10/31/2014  . Arthropathy 03/10/2013  . Low back pain 11/26/2011  . Kidney stone 11/04/2011  . Osteoarthritis 11/18/2010  . CAD (coronary artery disease) 10/11/2008  . Gout 03/28/2007  . Type 2 diabetes mellitus with peripheral neuropathy ([place]) 01/24/2007  . HYPERLIPIDEMIA 01/24/2007  . Essential hypertension 01/24/2007    , 07/03/2015, 5:04 PM  [facility]  [place], [place], [address] Phone: [phone]   Fax:  [phone]  Name: Kevin Mcclain MRN: [id number] Date of Birth: 07-20-1945    Serafina Royals, PT 07/03/2015 5:04 PM  PHYSICAL THERAPY DISCHARGE SUMMARY  Visits from Start of Care: 3  Current functional level related to goals / functional outcomes: Goals partially met as noted above.   Remaining deficits: Unknown, as patient did not return for follow-up as planned.   Education / Equipment: Self manual lymph drainage. Neck compression garments info. Plan: Patient agrees to discharge.  Patient goals were partially met. Patient is being discharged due to not returning since the last visit.  ?????    Serafina Royals, PT 01/28/16 11:28 AM

## 2015-07-10 ENCOUNTER — Ambulatory Visit: Payer: Medicare Other | Admitting: Physical Therapy

## 2015-07-15 ENCOUNTER — Other Ambulatory Visit: Payer: Self-pay | Admitting: Adult Health

## 2015-07-15 ENCOUNTER — Telehealth: Payer: Self-pay | Admitting: Adult Health

## 2015-07-15 DIAGNOSIS — R110 Nausea: Secondary | ICD-10-CM

## 2015-07-15 DIAGNOSIS — B029 Zoster without complications: Secondary | ICD-10-CM | POA: Insufficient documentation

## 2015-07-15 DIAGNOSIS — C099 Malignant neoplasm of tonsil, unspecified: Secondary | ICD-10-CM

## 2015-07-15 MED ORDER — OXYCODONE HCL 5 MG/5ML PO SOLN: 10.0000 mg | ORAL | Status: DC | PRN

## 2015-07-15 MED ORDER — ONDANSETRON HCL 4 MG PO TABS: 4.0000 mg | ORAL_TABLET | Freq: Three times a day (TID) | ORAL | Status: DC | PRN

## 2015-07-15 MED FILL — oxyCODONE HCL 5 MG/5ML SOLN: 5 | 19 days supply | Qty: 1750 | Fill #0

## 2015-07-15 MED FILL — ONDANSETRON HCL 4 MG TABLET: 4 | 10 days supply | Qty: 30 | Fill #0

## 2015-07-15 NOTE — Telephone Encounter (Signed)
I received a call from Kevin Mcclain letting me know that he needed refills on both his Oxycodone solution, as well as his Zofran tablets.  I have e-prescribed the Zofran to [facility], per patient request.  I have left a paper prescription for the Oxycodone at the [facility] station for the patient to pick-up at his convenience.   I have also scheduled him to come back to see me next week so we can continue titrating down on his pain meds.  He tells me that he is still using the Fentanyl 25 mcg patches and is not using the Oxycodone every 4 hours; mostly his pain is controlled, but he still has some "bad days" where the pain is worse.  I will see him next week to re-evaluate.  He knows to call me with any other questions or concerns.   Kevin Craze, NP [place] [phone]

## 2015-07-24 MED FILL — PANTOPRAZOLE SOD DR 40 MG T: 40 | 30 days supply | Qty: 30 | Fill #4

## 2015-07-25 ENCOUNTER — Ambulatory Visit (HOSPITAL_BASED_OUTPATIENT_CLINIC_OR_DEPARTMENT_OTHER): Payer: Medicare Other | Admitting: Adult Health

## 2015-07-25 ENCOUNTER — Encounter: Payer: Self-pay | Admitting: Adult Health

## 2015-07-25 VITALS — BP 145/75 | HR 67 | Temp 98.5°F | Resp 14 | Wt 130.4 lb

## 2015-07-25 DIAGNOSIS — R070 Pain in throat: Secondary | ICD-10-CM | POA: Diagnosis not present

## 2015-07-25 DIAGNOSIS — E46 Unspecified protein-calorie malnutrition: Secondary | ICD-10-CM

## 2015-07-25 DIAGNOSIS — C099 Malignant neoplasm of tonsil, unspecified: Secondary | ICD-10-CM

## 2015-07-25 DIAGNOSIS — I890 Lymphedema, not elsewhere classified: Secondary | ICD-10-CM | POA: Diagnosis not present

## 2015-07-25 DIAGNOSIS — K117 Disturbances of salivary secretion: Secondary | ICD-10-CM | POA: Diagnosis not present

## 2015-07-25 MED ORDER — FENTANYL 12 MCG/HR TD PT72: 12.5000 ug | MEDICATED_PATCH | TRANSDERMAL | Status: DC

## 2015-07-25 MED FILL — fentaNYL 12 MCG/HR PT72: 12 | 30 days supply | Qty: 10 | Fill #0

## 2015-07-25 NOTE — Patient Instructions (Signed)
-  Use 1 more of the 25 mcg Fentanyl patches, then when it is time to change that one, start using the 12.5 mcg patches.  -I will touch base with Glendell Docker to see if he can see you sometime in July. -I will see you back after the 4th of July. -Keep working hard on eating more by mouth and keeping your weight stable.  Keep working hard on your swallowing exercises and jaw exercises too.   Call me with any questions or concerns! [name], NP [phone]

## 2015-07-25 NOTE — Progress Notes (Signed)
CLINIC:  Survivorship  REASON FOR VISIT:  Routine follow-up for head & neck cancer & to address post-treatment acute survivorship needs.   BRIEF ONCOLOGIC HISTORY:  Oncology History   Tonsil cancer   Staging form: Pharynx - Oropharynx, AJCC 7th Edition     Clinical: Stage II (T2, N0, M0) - Signed by Eppie Gibson, MD on 10/31/2014       Tonsil cancer ([place])   12/04/2013 Miscellaneous TSH: 2.65; no h/o hypothyroidism.   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    11/06/2014 Imaging CT neck: Right palatine tonsil mass measuring 2.1 x 2.1 x 2.5 cm.  Asymmetric right LNs in level 1B, 2, and level 3.    12/03/2014 - 01/18/2015 Radiation Therapy Right tonsil and bilateral neck / 70 Gy in 35 fractions to gross disease, 63 Gy in 35 fractions to high risk nodal echelons, and 56 Gy in 35 fractions to intermediate risk nodal echelons   01/06/2015 Miscellaneous ER VISIT for dysphagia.  No hospital admission. CT neck done and pt was able to tolerate po liquids afer IV zofran. Discharged home.    01/06/2015 Imaging CT neck: Significant interval treatment response with no residual measurable right tonsil mass. Mild mucosal thickening and hyperenhancement in bilateral oropharynx & epiglottis, likely tx effect. No cervical adenpathy.   04/25/2015 Procedure PEG placement    05/16/2015 PET scan Restaging PET scan: Interval response to therapy. Resolution of hypermetabolism associated with right tonsillar lesion. No evidence of residual or recurrence of tumor or metastatic disease.     INTERVAL HISTORY:  Mr. Haggstrom presents today for routine follow-up. Overall, he has been feeling very well.  He still has "good days and bad days", but  recognizes that his "bad days" are nowhere close to how bad he felt several weeks ago.  For pain, he is still using the 25 mcg patch.  He is having regular bowel movements; uses Miralax when he needs to.  Has periodic nausea, but Zofran 2 mg helps alleviate.  His tube feedings have been going very well. He is only doing continuous tube feedings at night now (from about 9 pm "until the bag is empty" at 85-90 ml/hr).  He averages 3-5 cans of Osmolite per day.  During the day, he eats a regular diet. Able to tolerate a wider variety of foods by mouth now.    Pain: Throat pain improving; on a "bad day"-taking oxycodone 4x/day. On a "good day"-taking oxycodone 2x/day.  Nutritional Status:  -Intake/Diet: Regular diet (during the day); continuous tube feedings with Osmolite at night (average 3-5 cans/day) -Using a feeding tube? Yes -Weight change: GAIN ~5 lbs since 05/30/15  Swallowing:  Much less pain with swallowing; "more good days than bad; it is definitely getting better"   Last ENT visit: Saw Wolicki in 0000000 take Protonix in evening before dinner; increase nasal washing/nasal hygiene measures; RTC every 2-3 months for routine surveillance.   Last Dentist visit:  Saw Kulinski in 06/2015-continue trismus exercises; oral hygiene; return to primary care dentist as needed.    Last TSH:   TSH 1.878 on 03/13/15    ADDITIONAL REVIEW OF SYSTEMS:  Review of Systems  Constitutional: Negative for fever.       -Energy levels continuing to improve  HENT: Positive for sore throat.        -Sore throat pain being managed with Oxycodone and Fentanyl patch; pain slowly improving   Gastrointestinal: Negative for diarrhea and constipation.       -Reports increased belching "when my stomach is too full, but the acid pill helps that."    Genitourinary: Negative for dysuria.     CURRENT MEDICATIONS:  Current Outpatient Prescriptions on File Prior to Visit  Medication Sig Dispense Refill  . fentaNYL  (DURAGESIC - DOSED MCG/HR) 25 MCG/HR patch Place 1 patch (25 mcg total) onto the skin every 3 (three) days. 10 patch 0  . gabapentin (NEURONTIN) 300 MG capsule Take 1 capsule (300 mg total) by mouth 3 (three) times daily. Take 1 capsule (300 mg) on Day 1. Next, take 1 capsule (300 mg) twice daily on Day 2. Then, take 1 capsule (300 mg) three times daily on Day 3 and all days thereafter. 90 capsule 2  . metoprolol succinate (TOPROL-XL) 25 MG 24 hr tablet Take 25 mg by mouth daily.    . nitroGLYCERIN (NITROSTAT) 0.4 MG SL tablet Place 1 tablet (0.4 mg total) under the tongue every 5 (five) minutes as needed. (Patient not taking: Reported on 06/10/2015) 50 tablet 11  . Nutritional Supplements (FEEDING SUPPLEMENT, VITAL 1.5 CAL,) LIQD Begin Vital 1.5 at 20 cc/hr continuously on Friday, 1-24.  If tolerated, increase to 25 cc/hr day 3 and 30 cc/hr on Day 4. Flush with 120 cc free water QID plus and additional 720 cc water by mouth daily.  Please send supplies to home and instruct patient on continuous feeding pump. 1422 mL   . ondansetron (ZOFRAN) 4 MG tablet Take 1 tablet (4 mg total) by mouth every 8 (eight) hours as needed for nausea or vomiting. 30 tablet 5  . ondansetron (ZOFRAN-ODT) 8 MG disintegrating tablet Take 1 tablet (8 mg total) by mouth every 8 (eight) hours as needed for nausea or vomiting. (Patient not taking: Reported on 06/10/2015) 30 tablet 5  . OVER THE COUNTER MEDICATION Place 1 drop into both eyes 3 (three) times daily as needed (dry eyes/ irritation). Clear Cooling Eye Drops    . oxyCODONE (ROXICODONE) 5 MG/5ML solution Take 10 mLs (10 mg total) by mouth every 4 (four) hours as needed for moderate pain or severe pain. Take 10-15 mLs (10 mg-15 mg) by mouth every 4 hours as needed for pain. 1750 mL 0  . pantoprazole (PROTONIX) 40 MG tablet Take 1 tablet (40 mg total) by mouth daily. 30 tablet 5  . pilocarpine (SALAGEN) 5 MG tablet Take 1 tablet (5 mg total) by mouth 2 (two) times daily. 60  tablet 5  . sodium fluoride (PREVIDENT 5000 PLUS) 1.1 % CREA dental cream Apply to tooth brush. Brush teeth for 2 minutes. Spit out excess-DO NOT swallow. Repeat nightly. 1 Tube prn   No current facility-administered medications on file prior to visit.    ALLERGIES:  Allergies  Allergen Reactions  . Benzonatate Swelling    "swells my throat"  . Codeine Itching  . Sitagliptin Phosphate Itching   IV FLUID ADMINISTRATION RECORD: Date Volume & IVF  02/07/15 1L NS  02/01/15 1L NS  01/30/15 1L NS   01/29/15 1L NS  01/28/15 (IVF recommended, but pt declined)  01/25/15 1L NS + 30 meq KCl  01/23/15 1L NS + 30 meq KCl  01/22/15 1L NS + 30 meq KCl (+ IV Zofran)  01/18/15 (last day of tx) 1L NS + 30  meq KCl  01/17/15 (during tx) 1L NS + 30 meq KCl  01/16/15 (during tx) 1L NS + 30 meq KCl   01/15/15 (during tx) 1L NS   01/14/15 (during tx) 1L NS   01/12/15 (during tx) 1L NS   01/10/15 (during tx) 1L NS   01/08/15 (during tx) 1L NS            PHYSICAL EXAM:  Filed Vitals:   07/25/15 1100  BP: 145/75  Pulse: 67  Temp: 98.5 F (36.9 C)  Resp: 14    Weight Date  130 lb 6.4 oz (59.149 kg) 07/25/15  125 lb 14.4 oz (57.108 kg) 05/30/15  120 lb 8 oz (54.658 kg) 05/17/15  115 lb 3.2 oz (52.254 kg) 05/03/15  114 lb 8 oz (51.937 kg) 04/29/15  115 lb 4.8 oz (52.3 kg) 04/23/15  115 lb 12.8 oz (52.527 kg) 04/15/15  119 lb 9.6 oz (54.25 kg) 03/13/15  121 lb 8 oz (55.112 kg) 02/26/15  120 lb 6.4 oz (54.613 kg) 02/18/15  122 lb 8 oz (55.566 kg) 02/11/15  124 lb 9.6 oz (56.518 kg) 02/07/15  125 lb 9.6 oz (56.972 kg) 02/04/15  127 lb 14.4 oz (58.015 kg) 01/28/15  130 lb 6.4 oz (59.149 kg) 01/21/15  132 lb 3.2 oz (59.966 kg) 01/14/15  136 lb 12.8 oz (62.052 kg) 01/07/15  142 lb 12.8 oz (64.774 kg) 12/24/14  154 lb 9.6 oz (70.126 kg) Pre-treatment: 10/31/14          General: Thin male in no distress.  Accompanied by his wife today.    HEENT: Head is atraumatic and normocephalic.  Pupils equal and reactive to light. Conjunctivae clear without exudate.  Sclerae anicteric.  Mucous membranes are pink and moist. Tongue is dry. Posterior oropharynx without lesions.  Lymph: No cervical, supraclavicular, or infraclavicular lymphadenopathy.  Neck: No palpable masses. (+) anterior neck mild lymphedema and mild fibrosis.  Skin on neck is dry.  Cardiovascular: Normal rate and regular rhythm. Respiratory: Clear to auscultation bilaterally. Chest expansion symmetric without accessory muscle use. Breathing non-labored. GI: Abdomen soft and flat. No tenderness to palpation. Bowel sounds normoactive. G-tube in place.  Neuro: No focal deficits. Steady gait.  Psych: Normal mood and affect for situation.  Skin: Warm and dry.   LABORATORY DATA:  None at this visit.  DIAGNOSTIC IMAGING:  None at this visit.    ASSESSMENT & PLAN:  Mr. Flach is a pleasant 70 y.o. male with history of cancer of the right tonsil, treated with IMRT alone; completed treatment on 01/18/15. Patient presents to survivorship clinic today for routine follow-up.  1. Cancer of the right tonsil:  Mr. Segel continues to recover from definitive treatment with radiation therapy to his head and neck.  Clinically, he is continuing to slowly improve and there are no signs of recurrence. He saw Dr. Erik Obey in [id number] and was told "everything looks good."  He will need to see Dr. Isidore Moos in 11/2015 for continued surveillance. Mr. Decou will continue to be seen in the survivorship clinic to address his acute survivorship concerns and to continue to wean off of his pain medications and work on getting his feeding tube removed.   2. Pain management: His throat pain is improving and he is continuing to progress towards healing. His oral exam is much improved from previous as well. Therefore, I have further decreased his Fentanyl patch dose to 12.5 mcg (#10, no refills).   I encouraged him to use 1 more of  the 25 mcg patches and then start the 12.5 mcg patch when it was time to change the patch again.  This will provide 3 additional days at the current dose before decreasing given the upcoming holiday weekend.   I provided them with this new prescription today.  He understands that we will continue to work slowly on gradually weaning him off of opiates, while still managing his pain.  I will see him back in early July, where I will plan to stop the Fentanyl patches all together at that time and maintain only the oxycodone.  About 1 month after that, my hope is he will be better healed and require less pain medications, thus being able to drastically cut down on the oxycodone as well.  I am intentionally going slow with this patient given his continued healing.  Mr. Rosenbauer understands this plan and agree with it.    3. Bowel regimen/Constipation prevention: Encouraged him to continue taking the Miralax as prescribed/recommended by GI. He understands he should have a bowel movement every day or every other day, especially while taking opiates.   4. Protein-calorie malnutrition, improving:  Mr. Baldivia is continuing to gain weight, which is extremely encouraging.  I commended his efforts, as this has not been an easy task for him.  He is going to try to increase his calorie consumption by mouth during the day and may experiment some with decreasing his tube feeding volumes at night as a result.  He tells me that he weighs himself daily, so I told him that if he notices that by making this change he starts losing weight, then he should resume his old feeding regimen.  He is very motivated to get his feeding tube removed.  I reminded him that his weight needs to remain stable on all oral intake and Garald Balding, SLP has to tell me that it is safe for him to swallow/having feeding tube removed before I will place orders.  I am hopeful that we can get his feeding tube removed  sometime in the next 2 months, if he continues to progress as he is now with his nutrition.   5. Speech therapy: He understands the importance of maintaining his appointments with speech therapy and I reiterated that for him again today. Mr. Bazer thinks he is supposed to see Garald Balding, SLP sometime in 08/2015, but he isn't sure. I let the patient know that I will forward my note today to Glendell Docker to have him advise the patient on when he should come in for his next appt.  He is happy to coordinate an appt with Glendell Docker on the same day he sees me in July, if Glendell Docker has availability.    6. Neck lymphedema:   Mr. Zamora tells me that he has a compression garment and is periodically performing neck massage.  I commended his efforts and encouraged him to keep up the good work.    7. Xerostomia/Oral care/Trismus: I encouraged him to keep up the good work in managing his dry mouth (the Biotene products and water sips are helpful for him).  He understands the importance of his oral health and performing trismus exercises; I reinforced this again today.    Dispo:  -Return to survivorship clinic to see me in 08/2015.  -He knows to call me with any questions or concerns before that time.     A total of 30 minutes was spent in face-to-face care for this patient, with greater than 50% of that time  in counseling and care-coordination.    [name], NP [place] [phone]

## 2015-07-31 ENCOUNTER — Telehealth: Payer: Self-pay | Admitting: Adult Health

## 2015-07-31 ENCOUNTER — Other Ambulatory Visit: Payer: Self-pay | Admitting: Adult Health

## 2015-07-31 DIAGNOSIS — C099 Malignant neoplasm of tonsil, unspecified: Secondary | ICD-10-CM

## 2015-07-31 MED ORDER — OXYCODONE HCL 5 MG/5ML PO SOLN: 10.0000 mg | ORAL | Status: DC | PRN

## 2015-07-31 NOTE — Telephone Encounter (Signed)
I received a call from [name] letting me know that he needs a refill on his Oxycodone breakthrough pain medication.  He tells me that he has started the Fentanyl 12.5 mcg patch and "sometimes it has been rough, but I'm going to make it work."  He is hopeful and motivated to get off of the pain medications whenever he is able.  We are slowly making progress.    He tells me that his weight is continuing to increase, which is encouraging.  He asked if it would be okay for him to skip some of his nighttime feedings.  I shared that as long as his weight is remaining stable and he is substituting the tube feeding calories with oral intake during the day, then that should be fine for him to experiment in increasing his daily oral intake.  He is also very motivated to have his feeding tube removed as well, but understands he has to be consuming all oral intake in order for that to happen.  He agrees with this plan.   I have left a prescription for Oxycodone 5mg /80ml po Q4Hprn, #1750, no refills at the [facility] for him to pick up at his convenience.  He knows to call me with any other questions or concerns.   [name], NP [place] [phone]

## 2015-08-01 ENCOUNTER — Telehealth: Payer: Self-pay | Admitting: Nutrition

## 2015-08-01 MED FILL — oxyCODONE HCL 5 MG/5ML SOLN: 5 | 19 days supply | Qty: 1750 | Fill #0

## 2015-08-01 NOTE — Telephone Encounter (Signed)
Contacted patient by telephone for nutrition follow-up.  Secondary to tonsil cancer. Current weight documented as 130.4 pounds on May 25 increased from 128.3 pounds April 11. Patient reports he is eating soft diet during the day. Patient reports he uses between 3 and 4 cans of Osmolite 1.5 continuous feedings overnight. Patient denies nutrition impact symptoms.  Nutrition diagnosis: Unintended weight loss improved.  Intervention: Patient was educated to decrease tube feedings to 3 cans nightly for one week.  He is to monitor his weight. If weight is stable, patient can decrease to 2 cans overnight for one week and finally one can for one week. Patient should then be able to maintain weight on oral diet alone. If patient is able to maintain current weight of 130 pounds with oral diet alone for 2 weeks, recommended feeding tube removal.  Monitoring, evaluation, goals: Patient will work to increase his oral intake to promote weight maintenance while slowly decreasing continuous feedings overnight.  Next visit: July 6 after survivorship visit.  **Disclaimer: This note was dictated with voice recognition software. Similar sounding words can inadvertently be transcribed and this note may contain transcription errors which may not have been corrected upon publication of note.**

## 2015-08-15 MED FILL — ONDANSETRON HCL 4 MG TABLET: 4 | 10 days supply | Qty: 30 | Fill #1

## 2015-08-16 MED FILL — PANTOPRAZOLE SOD DR 40 MG T: 40 | 30 days supply | Qty: 30 | Fill #5

## 2015-08-19 ENCOUNTER — Telehealth: Payer: Self-pay | Admitting: Adult Health

## 2015-08-19 ENCOUNTER — Other Ambulatory Visit: Payer: Self-pay | Admitting: Adult Health

## 2015-08-19 DIAGNOSIS — C099 Malignant neoplasm of tonsil, unspecified: Secondary | ICD-10-CM

## 2015-08-19 MED ORDER — OXYCODONE HCL 5 MG/5ML PO SOLN: 10.0000 mg | ORAL | Status: DC | PRN

## 2015-08-19 MED FILL — oxyCODONE HCL 5 MG/5ML SOLN: 5 | 19 days supply | Qty: 1750 | Fill #0

## 2015-08-19 NOTE — Telephone Encounter (Signed)
I received a call from Mr. Irons with requests for refill for liquid oxycodone.  According to the [place] Controlled Substance Reporting System ([place]), his last prescription was written on 07/31/15 and filled on 08/01/15 with 19 days worth of oxycodone.  Therefore, the prescription will be due for refill tomorrow 08/20/15.  Prescription written and left for patient at rad onc nursing station for him to pick up with photo ID.   He tells me that he has been struggling with the decrease in his Fentanyl patch dose.  His pain control has gotten worse since decreasing the Fentanyl patch to 12.5 mcg.  He self-titrated the patch and put back on a 25 mcg patch over the weekend which helped.  His pain is all in his mouth/gums and is a burning pain.  He denies any new sores/open lesions in his mouth.    I encouraged him to restart the 12.5 mcg patch tomorrow and remove the 25 mcg patch.  I encouraged him to restart the Neurontin to see if this may help manage his burning pain.  I am scheduled to see him in early July for follow-up as of right now.   If the reduction in Fentanyl patch and addition of Gabapentin is ineffective, then I may consider sending him to a pain management clinic for further management.    He also asked about getting a new "stopper" for the end of his feeding tube, as it has been coming loose.  I encouraged him to reach out to [facility] and they may be able to direct him to a storefront where he can purchase a replacement item for his tube.     I encouraged him to give this new pain regimen a few days to work and he knows to call me if his pain worsens.    [name], NP [place] [phone]

## 2015-08-22 NOTE — Progress Notes (Signed)
Error

## 2015-08-27 NOTE — Telephone Encounter (Signed)
error 

## 2015-08-31 ENCOUNTER — Telehealth: Payer: Self-pay

## 2015-08-31 NOTE — Telephone Encounter (Signed)
Patient is on the list for [employer] 2017 and may be a good candidate for an AWV in 2017. Please let me know if/when appt is scheduled.   

## 2015-09-02 ENCOUNTER — Telehealth: Payer: Self-pay | Admitting: Adult Health

## 2015-09-02 NOTE — Telephone Encounter (Signed)
I received a call from Mr. Pinales requesting that we move his appt with me on Thursday to later in the afternoon, as he has conflicting appts.  I have moved his appt to 3:30pm on Thursday to be accommodate his schedule.  I let him know that I will notify [name], RD, as it looks like she wanted to touch base with the patient on that day as well. He voiced understanding of his new appt time and knows to call me with any further questions/concerns.   Message forwarded to [facility].   [name], NP [place] [phone]

## 2015-09-05 ENCOUNTER — Encounter: Payer: Self-pay | Admitting: Nutrition

## 2015-09-05 ENCOUNTER — Ambulatory Visit: Payer: Self-pay | Admitting: Adult Health

## 2015-09-05 ENCOUNTER — Ambulatory Visit (HOSPITAL_BASED_OUTPATIENT_CLINIC_OR_DEPARTMENT_OTHER): Payer: Medicare Other | Admitting: Adult Health

## 2015-09-05 VITALS — BP 153/71 | HR 67 | Temp 98.2°F | Resp 14 | Wt 129.7 lb

## 2015-09-05 DIAGNOSIS — I890 Lymphedema, not elsewhere classified: Secondary | ICD-10-CM

## 2015-09-05 DIAGNOSIS — R252 Cramp and spasm: Secondary | ICD-10-CM

## 2015-09-05 DIAGNOSIS — K117 Disturbances of salivary secretion: Secondary | ICD-10-CM

## 2015-09-05 DIAGNOSIS — E46 Unspecified protein-calorie malnutrition: Secondary | ICD-10-CM | POA: Diagnosis not present

## 2015-09-05 DIAGNOSIS — C099 Malignant neoplasm of tonsil, unspecified: Secondary | ICD-10-CM

## 2015-09-05 DIAGNOSIS — Z8589 Personal history of malignant neoplasm of other organs and systems: Secondary | ICD-10-CM

## 2015-09-05 MED ORDER — FENTANYL 12 MCG/HR TD PT72: 12.5000 ug | MEDICATED_PATCH | TRANSDERMAL | Status: DC

## 2015-09-05 MED ORDER — OXYCODONE HCL 5 MG/5ML PO SOLN: 10.0000 mg | ORAL | Status: DC | PRN

## 2015-09-05 MED FILL — fentaNYL 12 MCG/HR PT72: 12 | 30 days supply | Qty: 10 | Fill #0

## 2015-09-05 MED FILL — oxyCODONE HCL 5 MG/5ML SOLN: 5 | 19 days supply | Qty: 1750 | Fill #0

## 2015-09-05 NOTE — Progress Notes (Signed)
CLINIC:  Survivorship  REASON FOR VISIT:  Routine follow-up for head & neck cancer & to address post-treatment acute survivorship needs.   BRIEF ONCOLOGIC HISTORY:  Oncology History   Tonsil cancer   Staging form: Pharynx - Oropharynx, AJCC 7th Edition     Clinical: Stage II (T2, N0, M0) - Signed by Eppie Gibson, MD on 10/31/2014       Tonsil cancer ([place])   12/04/2013 Miscellaneous TSH: 2.65; no h/o hypothyroidism.   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    11/06/2014 Imaging CT neck: Right palatine tonsil mass measuring 2.1 x 2.1 x 2.5 cm.  Asymmetric right LNs in level 1B, 2, and level 3.    12/03/2014 - 01/18/2015 Radiation Therapy Right tonsil and bilateral neck / 70 Gy in 35 fractions to gross disease, 63 Gy in 35 fractions to high risk nodal echelons, and 56 Gy in 35 fractions to intermediate risk nodal echelons   01/06/2015 Miscellaneous ER VISIT for dysphagia.  No hospital admission. CT neck done and pt was able to tolerate po liquids afer IV zofran. Discharged home.    01/06/2015 Imaging CT neck: Significant interval treatment response with no residual measurable right tonsil mass. Mild mucosal thickening and hyperenhancement in bilateral oropharynx & epiglottis, likely tx effect. No cervical adenpathy.   04/25/2015 Procedure PEG placement    05/16/2015 PET scan Restaging PET scan: Interval response to therapy. Resolution of hypermetabolism associated with right tonsillar lesion. No evidence of residual or recurrence of tumor or metastatic disease.     INTERVAL HISTORY:  Kevin Mcclain presents today for routine follow-up. Overall, he has been feeling very well.  He still has "good days and bad days", but  his bad days are less frequent.  Based on previous phone call discussions, I had recommended for him to restart the Neurontin along with the 12.5 mcg Fentanyl patch for intermittent throat burning and radiating pain.  He states that he restarted the Neurontin, but stopped it after a few days "because it made me real shaky and when I stopped taking it the shaking went away."  On a "good day", he may only take 2 doses of the breakthrough Oxycodone. On a "bad day", he may take up to 5 doses.  He is frustrated by his lack of taste, but endorses that it seems to be improving somewhat; "I can taste the first few bites of food, but then I can't taste much."  He is still struggling with dry mouth as well. He is able to eat during the day and is only doing tube feedings at night (~3 cans/night).  Some days are better than others with his oral intake, but he endorses trying different foods.  This morning for breakfast, he had sausage and 1/2 of a bagel and tolerated that well.  He feels like his lymphedema is better than it was; he does the massage and wears the compression garment occasionally.    Pain: Throat pain improving; on a "bad day"-taking oxycodone 5x/day. On a "good day"-taking oxycodone 2x/day.  Nutritional Status:  -Intake/Diet: Regular diet (during the day); continuous tube feedings with Osmolite at night (average 3 cans/day) -Using a feeding tube? Yes -Weight change: LOSS ~1 lb since 07/25/15  Swallowing:  Much less  pain with swallowing; "more good days than bad; it is definitely getting better, it's just slow"   Last ENT visit: Saw Wolicki in 0000000 take Protonix in evening before dinner; increase nasal washing/nasal hygiene measures; RTC every 2-3 months for routine surveillance.   Last Dentist visit:  Saw Kulinski in 06/2015-continue trismus exercises; oral hygiene; return to primary care dentist as needed.    Last TSH:   TSH 1.878 on 03/13/15    ADDITIONAL REVIEW OF SYSTEMS:  Review  of Systems  Constitutional: Negative for fever.       -Energy levels continuing to improve  HENT: Positive for sore throat.        -Sore throat pain being managed with Oxycodone and Fentanyl patch; pain slowly improving   Gastrointestinal: Negative for diarrhea and constipation.       -Reports increased belching "when my stomach is too full, but the acid pill helps that."    Genitourinary: Negative for dysuria.     CURRENT MEDICATIONS:  Current Outpatient Prescriptions on File Prior to Visit  Medication Sig Dispense Refill  . fentaNYL (DURAGESIC - DOSED MCG/HR) 12 MCG/HR Place 1 patch (12.5 mcg total) onto the skin every 3 (three) days. 10 patch 0  . metoprolol succinate (TOPROL-XL) 25 MG 24 hr tablet Take 25 mg by mouth daily.    . nitroGLYCERIN (NITROSTAT) 0.4 MG SL tablet Place 1 tablet (0.4 mg total) under the tongue every 5 (five) minutes as needed. (Patient not taking: Reported on 06/10/2015) 50 tablet 11  . Nutritional Supplements (FEEDING SUPPLEMENT, VITAL 1.5 CAL,) LIQD Begin Vital 1.5 at 20 cc/hr continuously on Friday, 1-24.  If tolerated, increase to 25 cc/hr day 3 and 30 cc/hr on Day 4. Flush with 120 cc free water QID plus and additional 720 cc water by mouth daily.  Please send supplies to home and instruct patient on continuous feeding pump. 1422 mL   . ondansetron (ZOFRAN) 4 MG tablet Take 1 tablet (4 mg total) by mouth every 8 (eight) hours as needed for nausea or vomiting. 30 tablet 5  . OVER THE COUNTER MEDICATION Place 1 drop into both eyes 3 (three) times daily as needed (dry eyes/ irritation). Clear Cooling Eye Drops    . oxyCODONE (ROXICODONE) 5 MG/5ML solution Take 10 mLs (10 mg total) by mouth every 4 (four) hours as needed for moderate pain or severe pain. Take 10-15 mLs (10 mg-15 mg) by mouth every 4 hours as needed for pain. 1750 mL 0  . pantoprazole (PROTONIX) 40 MG tablet Take 1 tablet (40 mg total) by mouth daily. 30 tablet 5  . polyethylene glycol (MIRALAX /  GLYCOLAX) packet Take 17 g by mouth daily as needed.    . sodium fluoride (PREVIDENT 5000 PLUS) 1.1 % CREA dental cream Apply to tooth brush. Brush teeth for 2 minutes. Spit out excess-DO NOT swallow. Repeat nightly. 1 Tube prn   No current facility-administered medications on file prior to visit.    ALLERGIES:  Allergies  Allergen Reactions  . Benzonatate Swelling    "swells my throat"  . Codeine Itching  . Sitagliptin Phosphate Itching    IV FLUID ADMINISTRATION RECORD: Date Volume & IVF  02/07/15 1L NS  02/01/15 1L NS  01/30/15 1L NS   01/29/15 1L NS  01/28/15 (IVF recommended, but pt declined)  01/25/15 1L NS + 30 meq KCl  01/23/15 1L NS + 30 meq KCl  01/22/15 1L NS + 30 meq KCl (+ IV Zofran)  01/18/15 (last  day of tx) 1L NS + 30 meq KCl  01/17/15 (during tx) 1L NS + 30 meq KCl  01/16/15 (during tx) 1L NS + 30 meq KCl   01/15/15 (during tx) 1L NS   01/14/15 (during tx) 1L NS   01/12/15 (during tx) 1L NS   01/10/15 (during tx) 1L NS   01/08/15 (during tx) 1L NS            PHYSICAL EXAM:  Filed Vitals:   09/05/15 1515  BP: 153/71  Pulse: 67  Temp: 98.2 F (36.8 C)  Resp: 14    Weight Date  129 lb 11.2 oz (58.832 kg) 09/05/15  130 lb 6.4 oz (59.149 kg) 07/25/15  125 lb 14.4 oz (57.108 kg) 05/30/15  120 lb 8 oz (54.658 kg) 05/17/15  115 lb 3.2 oz (52.254 kg) 05/03/15  114 lb 8 oz (51.937 kg) 04/29/15  115 lb 4.8 oz (52.3 kg) 04/23/15  115 lb 12.8 oz (52.527 kg) 04/15/15  119 lb 9.6 oz (54.25 kg) 03/13/15  121 lb 8 oz (55.112 kg) 02/26/15  120 lb 6.4 oz (54.613 kg) 02/18/15  122 lb 8 oz (55.566 kg) 02/11/15  124 lb 9.6 oz (56.518 kg) 02/07/15  125 lb 9.6 oz (56.972 kg) 02/04/15  127 lb 14.4 oz (58.015 kg) 01/28/15  130 lb 6.4 oz (59.149 kg) 01/21/15  132 lb 3.2 oz (59.966 kg) 01/14/15  136 lb 12.8 oz (62.052 kg) 01/07/15  142 lb 12.8 oz (64.774 kg) 12/24/14  154 lb 9.6 oz (70.126 kg) Pre-treatment: 10/31/14           General: Thin male in no distress. Unaccompanied today.  HEENT: Head is atraumatic and normocephalic.  Pupils equal and reactive to light. Conjunctivae clear without exudate.  Sclerae anicteric.  Mucous membranes are pink and moist. Tongue is dry. Posterior oropharynx without lesions.  Lymph: No cervical, supraclavicular, or infraclavicular lymphadenopathy.  Neck: No palpable masses. Scant anterior neck mild lymphedema and mild fibrosis; lymphedema markedly improved from previous visit.  Skin on neck is dry.  Cardiovascular: Normal rate and regular rhythm. Respiratory: Clear to auscultation bilaterally. Chest expansion symmetric; breathing non-labored. GI: Abdomen soft and flat. No tenderness to palpation. Bowel sounds normoactive. G-tube in place.  Neuro: No focal deficits. Steady gait.  Psych: Normal mood and affect for situation.  Skin: Warm and dry.   LABORATORY DATA:  None at this visit.  DIAGNOSTIC IMAGING:  None at this visit.    ASSESSMENT & PLAN:  Mr. Stilwell is a pleasant 70 y.o. male with history of cancer of the right tonsil, treated with IMRT alone; completed treatment on 01/18/15. Patient presents to survivorship clinic today for routine follow-up.  1. Cancer of the right tonsil:  Mr. Thain continues to recover from definitive treatment with radiation therapy to his head and neck.  Clinically, he is continuing to slowly improve and there are no signs of recurrence. He saw Dr. Erik Obey in [id number] and was told "everything looks good."  He will need to see Dr. Isidore Moos in 11/2015 for continued surveillance as it will have been 6 months since his last visit with Dr. Isidore Moos at that time. This appt was made today prior to him leaving the cancer center.   2. Pain management: His throat pain is improving and he is continuing to progress towards healing. He will maintain the Fentanyl patch dose at 12.5 mcg (#10, no refills).  I also refilled his Oxycodone solution  today as well  (Oxycodone 5mg /62mL, take Q4Hprn, #  1715mL, no refills).  Both prescriptions provided to him today.  He understands that we will continue to work slowly on gradually weaning him off of opiates, while still managing his pain.  My previous plan had been to stop the Fentanyl patches today and only maintain the breakthrough pain medication.  However, he seems to still be requiring multiple doses per day on "bad days."  Therefore, we will maintain the Fentanyl patches with Oxycodone for breakthrough pain for at least 1 more month.  When he calls for a refill next month, I will discuss with the him the possibility of managing his pain with the Oxycodone alone until he sees Dr. Isidore Moos for follow-up in 11/2015.  I am concerned that he continues to have substantial pain on "bad days", which are becoming more infrequent, but remain concerning.  There is no evidence of recurrence on physical exam today nor on laryngoscopic exam with Dr. Erik Obey in [id number].  We will continue to closely monitor and work to wean down on the pain medication regimen.  We may have to consider sending him to a pain specialist if we are unable to wean him off of opiates in the coming months.    3. Bowel regimen/Constipation prevention: Encouraged him to continue taking the Miralax as prescribed/recommended by GI. He understands he should have a bowel movement every day or every other day, especially while taking opiates.   4. Protein-calorie malnutrition, improving:  Mr. Winkowski weight is stable, which is encouraging.  He is continuing to perform his swallowing exercises as directed by Garald Balding, SLP.  He will continue working on his oral intake during the day.  I am hopeful his weight will remain stable in the coming months and we can consider having his feeding tube removed.    5. Neck lymphedema:   Mr. Cavell tells me that he has a compression garment and is periodically performing neck massage.  I commended his  efforts and encouraged him to keep up the good work.    6. Xerostomia/Oral care/Trismus: I encouraged him to keep up the good work in managing his dry mouth (the Biotene products and water sips are helpful for him).  He understands the importance of his oral health; I reinforced this again today.    Dispo:  -Return to cancer center to see Dr. Isidore Moos in 11/2015.  -He knows to call me with any questions or concerns before that time.     A total of 20 minutes was spent in face-to-face care for this patient, with greater than 50% of that time in counseling and care-coordination.    [name], NP [place] [phone]

## 2015-09-09 ENCOUNTER — Encounter: Payer: Self-pay | Admitting: Family Medicine

## 2015-09-09 ENCOUNTER — Ambulatory Visit (INDEPENDENT_AMBULATORY_CARE_PROVIDER_SITE_OTHER): Payer: Medicare Other | Admitting: Family Medicine

## 2015-09-09 ENCOUNTER — Other Ambulatory Visit: Payer: Self-pay | Admitting: Family Medicine

## 2015-09-09 VITALS — BP 134/66 | HR 90 | Temp 98.3°F | Ht 64.0 in | Wt 130.0 lb

## 2015-09-09 DIAGNOSIS — I10 Essential (primary) hypertension: Secondary | ICD-10-CM

## 2015-09-09 DIAGNOSIS — E1142 Type 2 diabetes mellitus with diabetic polyneuropathy: Secondary | ICD-10-CM

## 2015-09-09 DIAGNOSIS — I2510 Atherosclerotic heart disease of native coronary artery without angina pectoris: Secondary | ICD-10-CM | POA: Diagnosis not present

## 2015-09-09 DIAGNOSIS — E782 Mixed hyperlipidemia: Secondary | ICD-10-CM

## 2015-09-09 DIAGNOSIS — Z7289 Other problems related to lifestyle: Secondary | ICD-10-CM

## 2015-09-09 DIAGNOSIS — E785 Hyperlipidemia, unspecified: Secondary | ICD-10-CM | POA: Diagnosis not present

## 2015-09-09 LAB — COMPREHENSIVE METABOLIC PANEL
ALT: 26 U/L (ref 0–53)
AST: 21 U/L (ref 0–37)
Albumin: 4.2 g/dL (ref 3.5–5.2)
Alkaline Phosphatase: 89 U/L (ref 39–117)
BUN: 17 mg/dL (ref 6–23)
CALCIUM: 9.9 mg/dL (ref 8.4–10.5)
CO2: 32 meq/L (ref 19–32)
Chloride: 99 mEq/L (ref 96–112)
Creatinine, Ser: 0.68 mg/dL (ref 0.40–1.50)
GFR: 121.8 mL/min (ref >60.00)
Glucose, Bld: 162 mg/dL — ABNORMAL HIGH (ref 70–99)
Potassium: 4.7 mEq/L (ref 3.5–5.1)
Sodium: 137 mEq/L (ref 135–145)
Total Bilirubin: 0.8 mg/dL (ref 0.2–1.2)
Total Protein: 6.6 g/dL (ref 6.0–8.3)

## 2015-09-09 LAB — HEMOGLOBIN A1C: HEMOGLOBIN A1C: 5.9 % (ref 4.6–6.5)

## 2015-09-09 LAB — CBC
HCT: 41.3 % (ref 39.0–52.0)
HEMOGLOBIN: 14 g/dL (ref 13.0–17.0)
MCHC: 34 g/dL (ref 30.0–36.0)
MCV: 90.2 fl (ref 78.0–100.0)
PLATELETS: 215 10*3/uL (ref 150.0–400.0)
RBC: 4.57 Mil/uL (ref 4.22–5.81)
RDW: 13.8 % (ref 11.5–15.5)
WBC: 8.2 10*3/uL (ref 4.0–10.5)

## 2015-09-09 LAB — CHOLESTEROL, TOTAL: CHOLESTEROL: 153 mg/dL (ref 0–200)

## 2015-09-09 LAB — LDL CHOLESTEROL, DIRECT: Direct LDL: 90 mg/dL

## 2015-09-09 LAB — HDL CHOLESTEROL: HDL: 55.5 mg/dL (ref >39.00)

## 2015-09-09 LAB — TRIGLYCERIDES: TRIGLYCERIDES: 58 mg/dL (ref 0.0–149.0)

## 2015-09-09 NOTE — Assessment & Plan Note (Signed)
S: poorly controlled on with LDL goal <70 on no statin but at least <100. No myalgias.  Lab Results  Component Value Date   CHOL 153 09/09/2015   HDL 55.50 09/09/2015   LDLCALC 53 12/04/2013   LDLDIRECT 90.0 09/09/2015   TRIG 58.0 09/09/2015   CHOLHDL 3 12/04/2013   A/P: trial atorvastatin 10mg  crushed and placed in tube if patient agreeable- otherwise stated hed be willing to see cards at follow up so their input would be valuable

## 2015-09-09 NOTE — Assessment & Plan Note (Signed)
S: controlled on metoprolol. Last visit had missed dose and elevated BP Readings from Last 3 Encounters:  09/09/15 134/66  09/05/15 153/71  07/25/15 145/75  A/P:Continue current meds:  Doing well;

## 2015-09-09 NOTE — Progress Notes (Signed)
Subjective:  Kevin Mcclain is a 70 y.o. year old very pleasant male patient who presents for/with See problem oriented charting ROS- No chest pain or shortness of breath. No headache or blurry vision. .see any ROS included in HPI as well.   Past Medical History-  Patient Active Problem List   Diagnosis Date Noted  . G tube feedings (Kevin Mcclain) 06/10/2015    Priority: High  . Tonsil cancer (Kevin Mcclain) 10/31/2014    Priority: High  . Osteoarthritis 11/18/2010    Priority: High  . CAD (coronary artery disease) 10/11/2008    Priority: High  . Type 2 diabetes mellitus with peripheral neuropathy (Kevin Mcclain) 01/24/2007    Priority: High  . Gout 03/28/2007    Priority: Medium  . HYPERLIPIDEMIA 01/24/2007    Priority: Medium  . Essential hypertension 01/24/2007    Priority: Medium  . GERD (gastroesophageal reflux disease) 06/10/2015    Priority: Low  . Arthropathy 03/10/2013    Priority: Low  . Low back pain 11/26/2011    Priority: Low  . Kidney stone 11/04/2011    Priority: Low  . Herpes zoster 07/15/2015    Medications- reviewed and updated Current Outpatient Prescriptions  Medication Sig Dispense Refill  . aspirin 81 MG tablet Take 81 mg by mouth daily.    . fentaNYL (DURAGESIC - DOSED MCG/HR) 12 MCG/HR Place 1 patch (12.5 mcg total) onto the skin every 3 (three) days. 10 patch 0  . metoprolol succinate (TOPROL-XL) 25 MG 24 hr tablet Take 25 mg by mouth daily.    . nitroGLYCERIN (NITROSTAT) 0.4 MG SL tablet Place 1 tablet (0.4 mg total) under the tongue every 5 (five) minutes as needed. 50 tablet 11  . Nutritional Supplements (FEEDING SUPPLEMENT, VITAL 1.5 CAL,) LIQD Begin Vital 1.5 at 20 cc/hr continuously on Friday, 1-24.  If tolerated, increase to 25 cc/hr day 3 and 30 cc/hr on Day 4. Flush with 120 cc free water QID plus and additional 720 cc water by mouth daily.  Please send supplies to home and instruct patient on continuous feeding pump. 1422 mL   . ondansetron (ZOFRAN) 4 MG tablet Take  1 tablet (4 mg total) by mouth every 8 (eight) hours as needed for nausea or vomiting. 30 tablet 5  . OVER THE COUNTER MEDICATION Place 1 drop into both eyes 3 (three) times daily as needed (dry eyes/ irritation). Clear Cooling Eye Drops    . oxyCODONE (ROXICODONE) 5 MG/5ML solution Take 10 mLs (10 mg total) by mouth every 4 (four) hours as needed for moderate pain or severe pain. Take 10-15 mLs (10 mg-15 mg) by mouth every 4 hours as needed for pain. 1750 mL 0  . pantoprazole (PROTONIX) 40 MG tablet Take 1 tablet (40 mg total) by mouth daily. 30 tablet 5  . polyethylene glycol (MIRALAX / GLYCOLAX) packet Take 17 g by mouth daily as needed.    . sodium fluoride (PREVIDENT 5000 PLUS) 1.1 % CREA dental cream Apply to tooth brush. Brush teeth for 2 minutes. Spit out excess-DO NOT swallow. Repeat nightly. 1 Tube prn   No current facility-administered medications for this visit.    Objective: BP 134/66 mmHg  Pulse 90  Temp(Src) 98.3 F (36.8 C) (Oral)  Ht 5\' 4"  (1.626 m)  Wt 130 lb (58.968 kg)  BMI 22.30 kg/m2  SpO2 97% Gen: NAD, resting comfortably CV: RRR no murmurs rubs or gallops Lungs: CTAB no crackles, wheeze, rhonchi Abdomen: soft/nontender/nondistended/normal bowel sounds. No rebound or guarding. G tube in place  Ext: no edema Skin: warm, dry Neuro: grossly normal, moves all extremities  Assessment/Plan:  Essential hypertension S: controlled on metoprolol. Last visit had missed dose and elevated BP Readings from Last 3 Encounters:  09/09/15 134/66  09/05/15 153/71  07/25/15 145/75  A/P:Continue current meds:  Doing well;  HYPERLIPIDEMIA S: poorly controlled on with LDL goal <70 on no statin but at least <100. No myalgias.  Lab Results  Component Value Date   CHOL 153 09/09/2015   HDL 55.50 09/09/2015   LDLCALC 53 12/04/2013   LDLDIRECT 90.0 09/09/2015   TRIG 58.0 09/09/2015   CHOLHDL 3 12/04/2013   A/P: trial atorvastatin 10mg  crushed and placed in tube if patient  agreeable- otherwise stated hed be willing to see cards at follow up so their input would be valuable  CAD (coronary artery disease) S: asymptomatic. Did restart statin A/P: declines repeat cards visit at present- agrees to at 4 month follow up depending on volume of visits from cancer follow up. Advising statin after viewing LDL >70 at 90. See hld  Type 2 diabetes mellitus with peripheral neuropathy (Kevin Mcclain) S: remains controlled. On metformin 500mg  BID prn. Clammy sweaty feeling and bs 150 or 160 and takes metformin 500mg  bid CBGs- has noted into 150s and 160s Lab Results  Component Value Date   HGBA1C 5.4 04/19/2015   HGBA1C 6.0 10/10/2014   HGBA1C 6.2 12/04/2013   A/P: will just schedule once a day in AM  4 months verbal  Orders Placed This Encounter  Procedures  . CBC    [facility]  . Comprehensive metabolic panel    [facility]  . LDL cholesterol, direct    [facility]  . Cholesterol, Total  . HDL cholesterol  . Triglycerides  . Hepatitis C antibody, reflex    [facility]  . Hemoglobin A1c    [facility]    Meds ordered this encounter  Medications  . aspirin 81 MG tablet    Sig: Take 81 mg by mouth daily.    Return precautions advised.  Kevin Mcclain

## 2015-09-09 NOTE — Assessment & Plan Note (Signed)
S: remains controlled. On metformin 500mg  BID prn. Clammy sweaty feeling and bs 150 or 160 and takes metformin 500mg  bid CBGs- has noted into 150s and 160s Lab Results  Component Value Date   HGBA1C 5.4 04/19/2015   HGBA1C 6.0 10/10/2014   HGBA1C 6.2 12/04/2013   A/P: will just schedule once a day in AM

## 2015-09-09 NOTE — Assessment & Plan Note (Signed)
S: asymptomatic. Did restart statin A/P: declines repeat cards visit at present- agrees to at 4 month follow up depending on volume of visits from cancer follow up. Advising statin after viewing LDL >70 at 90. See hld

## 2015-09-09 NOTE — Patient Instructions (Signed)
Labs before you leave- glad you continue to do so well considering  Let's have you take metformin every morning at 500mg . You can take an extra dose if you feel you need it. Will plan on this as long as a1c under 7- if above 7 then will likely schedule twice a day

## 2015-09-09 NOTE — Progress Notes (Signed)
Pre visit review using our clinic review tool, if applicable. No additional management support is needed unless otherwise documented below in the visit note. 

## 2015-09-10 ENCOUNTER — Other Ambulatory Visit: Payer: Self-pay

## 2015-09-10 LAB — HEPATITIS C ANTIBODY: HCV Ab: NEGATIVE

## 2015-09-10 MED ORDER — ATORVASTATIN CALCIUM 10 MG PO TABS: 10.0000 mg | ORAL_TABLET | Freq: Every day | ORAL | Status: DC

## 2015-09-10 MED ORDER — METFORMIN HCL 500 MG PO TABS: 500.0000 mg | ORAL_TABLET | Freq: Every day | ORAL | Status: DC

## 2015-09-10 MED FILL — ATORVASTATIN 10 MG TABLET: 10 | 90 days supply | Qty: 90 | Fill #0

## 2015-09-10 MED FILL — metFORMIN HCL 500 MG TABS: 500 | 90 days supply | Qty: 90 | Fill #0

## 2015-09-23 ENCOUNTER — Other Ambulatory Visit: Payer: Self-pay | Admitting: Adult Health

## 2015-09-23 ENCOUNTER — Telehealth: Payer: Self-pay | Admitting: Adult Health

## 2015-09-23 DIAGNOSIS — K219 Gastro-esophageal reflux disease without esophagitis: Secondary | ICD-10-CM

## 2015-09-23 DIAGNOSIS — R112 Nausea with vomiting, unspecified: Secondary | ICD-10-CM

## 2015-09-23 DIAGNOSIS — C099 Malignant neoplasm of tonsil, unspecified: Secondary | ICD-10-CM

## 2015-09-23 MED ORDER — PANTOPRAZOLE SODIUM 40 MG PO TBEC: 40.0000 mg | DELAYED_RELEASE_TABLET | Freq: Every day | ORAL | 5 refills | Status: DC

## 2015-09-23 MED ORDER — OXYCODONE HCL 5 MG/5ML PO SOLN: 10.0000 mg | ORAL | 0 refills | Status: DC | PRN

## 2015-09-23 MED FILL — oxyCODONE HCL 5 MG/5ML SOLN: 5 | 29 days supply | Qty: 1750 | Fill #0

## 2015-09-23 MED FILL — PANTOPRAZOLE SOD DR 40 MG T: 40 | 30 days supply | Qty: 30 | Fill #0

## 2015-09-23 MED FILL — ONDANSETRON HCL 4 MG TABLET: 4 | 10 days supply | Qty: 30 | Fill #2

## 2015-09-23 NOTE — Telephone Encounter (Signed)
I received a call from Mr. Rufenacht requesting a refill on his Oxycodone liquid pain medication, as well as the Protonix.   He tells me that overall he is doing very well.  He is still using the Fentanyl patch (12.5 mcg) without complaints.  On a "good" day, he uses the oxycodone twice per day (once in the morning & once at bedtime).  On a "bad" day, he uses the oxycodone about 3x/day.  He states that he continues to having burning pain in his throat and neck.    I proposed that we do the following to continue to wean down on his opiate use:  -I will refill the oxycodone solution today.  -When he is due for the Fentanyl patch refill (approximately early 10/2015), then we discussed no more refills on the Fentanyl patches and continuing with the oxycodone alone for his pain mgmt.    He agreed with this plan and understands the importance of continuing to wean down on his opiate use.     As it mandated by the [place] STOP Act (Strengthen Opioid Misuse Prevention), the [place] Controlled Substance Reporting System ([place]) was reviewed for this patient. Below is the past 1-months of controlled substance prescriptions provided for him as displayed by the registry.  I have reviewed his case with Dr. Frederich Cha, my supervising physician, and he agrees that continuing opiate therapy with Oxycodone is appropriate at this time.  A prescription for Oxycodone 5mg /41mL, take 10 mL po/VT Q4Hprn, #1735mL, no refills was written today and left in an envelope at the [facility] where the patient will retrieve the prescription after showing photo ID.   [name], NP [place] [phone]

## 2015-10-14 ENCOUNTER — Other Ambulatory Visit: Payer: Self-pay | Admitting: Adult Health

## 2015-10-14 ENCOUNTER — Telehealth: Payer: Self-pay | Admitting: Adult Health

## 2015-10-14 DIAGNOSIS — C099 Malignant neoplasm of tonsil, unspecified: Secondary | ICD-10-CM

## 2015-10-14 MED ORDER — OXYCODONE HCL 5 MG/5ML PO SOLN: 10.0000 mg | ORAL | 0 refills | Status: DC | PRN

## 2015-10-14 MED FILL — oxyCODONE HCL 5 MG/5ML SOLN: 5 | 29 days supply | Qty: 1750 | Fill #0

## 2015-10-14 MED FILL — ONDANSETRON HCL 4 MG TABLET: 4 | 10 days supply | Qty: 30 | Fill #3

## 2015-10-14 NOTE — Telephone Encounter (Signed)
I received a call from [name] requesting a new prescription for oxycodone.  He tells me that some days his pain control is great and he doesn't need to take the oxycodone at all, then other days he has to take it a few times per day.    He tells me that he continues to have the burning in his throat; he also has periodic skin redness that resolves; "My skin on my neck looks like I just got radiation sometimes."  He saw Dr. Erik Obey, ENT, a couple of weeks ago who gave him reassuring news that there was no sign of cancer recurrence, but his exam did look "red and raw" per patient.   [name] is now off of the Fentanyl patch and managing his pain with the oxycodone alone.  I provided a prescription for the patient to pick up today from the front desk at the cancer center.  He is scheduled to return to the cancer center in mid-September; I encouraged him to call me if his pain worsens as I would like to see him sooner, if needed.  He agreed to this plan.   [name], NP [place] [phone]

## 2015-10-21 NOTE — Telephone Encounter (Signed)
error 

## 2015-10-23 NOTE — Telephone Encounter (Signed)
error 

## 2015-11-13 ENCOUNTER — Other Ambulatory Visit: Payer: Self-pay | Admitting: Adult Health

## 2015-11-13 ENCOUNTER — Telehealth: Payer: Self-pay | Admitting: Adult Health

## 2015-11-13 DIAGNOSIS — C099 Malignant neoplasm of tonsil, unspecified: Secondary | ICD-10-CM

## 2015-11-13 MED ORDER — OXYCODONE HCL 5 MG/5ML PO SOLN: 10.0000 mg | ORAL | 0 refills | Status: DC | PRN

## 2015-11-13 MED FILL — PANTOPRAZOLE SOD DR 40 MG T: 40 | 30 days supply | Qty: 30 | Fill #1

## 2015-11-13 MED FILL — oxyCODONE HCL 5 MG/5ML SOLN: 5 | 29 days supply | Qty: 1750 | Fill #0

## 2015-11-13 NOTE — Telephone Encounter (Signed)
I received a call from Mr. Duerksen asking for a refill of his oxycodone pain medication.  He tells me that the burning in his mouth, tongue, and throat seems worse than in recent weeks.  He tried taking the gabapentin with the oxycodone, "but it made me crazy; made me have crazy thoughts, so I stopped taking it."  He is using the oxycodone every 4 hours now around the clock again.  His pain at its worst is a 7/10; decreases to 3/10 after pain meds.   He is very discouraged; "I just don't feel like I'm ever going to get better; this is really messing with my quality of life and is so frustrating."  He is concerned that he may have some type of "sore" in his throat because the burning pain is so bad. Dr. Erik Obey saw him about 1 month ago and told him that his throat just looked very dry.  Patient is still doing the salt water/baking soda rinses.  He is using biotene gel, but even this burns his throat; he tells me he waters it down and is better able to tolerate it.  He has lost about 3 lbs (now weighs 127 lbs).  He is not eating well because of the dry mouth.    I offered to have him come in sooner to be seen, but he prefers to wait until his 11/20/15 appt with Dr. Isidore Moos.  I encouraged him to call me if his symptoms get worse and we can always try to get him in to see Dr. Isidore Moos sooner.    I did provide a refill of the Oxycodone solution today; Oxycodone 5 mg/37mL po Q4Hprn, #1750 mL, no refills.   He knows to call with worsening symptoms.  I will forward this message to Dr. Isidore Moos so that she is aware and can further address his pain at his visit next week.   [name], NP [place] [phone]

## 2015-11-14 NOTE — Progress Notes (Signed)
[name] presents for follow up of radiation completed 01/18/15 to his Right Tonsil and bilateral neck.    Pain issues, if any: He has been having increased pain and burning in his throat. He tells me it runs from the Right side of his mouth straight across his throat to the Left side of his mouth. He is using oxycodone in the morning at night for pain relief. The pain is the worse in the morning and gets bad again at nighttime.  Using a feeding tube?: Yes, he is using continuous tube feeds at night when he feels like he has not eaten enough during the day. He tells me this averages out to about 4 nights a week.  Weight changes, if any:  Wt Readings from Last 3 Encounters:  11/20/15 127 lb 1.6 oz (57.7 kg)  09/09/15 130 lb (59 kg)  09/05/15 129 lb 11.2 oz (58.8 kg)   Swallowing issues, if any: He does not have a lot of saliva, and needs to moisten foods or drink with meals. He has difficulty chewing and will take longer to chew certain foods like meat. He tells me that he will taste his foods in the beginning of his meal, but loses the taste as the meal continues.  Smoking or chewing tobacco? No Using fluoride trays daily? Yes occasionally. He is using prevident toothpaste.  Last ENT visit was on: Dr. Erik Obey [id number] Other notable issues, if any: None  BP (!) 148/70   Pulse 74   Temp 98.4 F (36.9 C)   Ht 5\' 4"  (1.626 m)   Wt 127 lb 1.6 oz (57.7 kg)   SpO2 100% Comment: room air  BMI 21.82 kg/m

## 2015-11-20 ENCOUNTER — Ambulatory Visit
Admission: RE | Admit: 2015-11-20 | Discharge: 2015-11-20 | Disposition: A | Discharge status: Home / Self Care | Payer: Medicare Other | Source: Ambulatory Visit | Attending: Radiation Oncology | Admitting: Radiation Oncology

## 2015-11-20 ENCOUNTER — Encounter: Payer: Self-pay | Admitting: Radiation Oncology

## 2015-11-20 VITALS — BP 148/70 | HR 74 | Temp 98.4°F | Ht 64.0 in | Wt 127.1 lb

## 2015-11-20 DIAGNOSIS — Z79899 Other long term (current) drug therapy: Secondary | ICD-10-CM | POA: Diagnosis not present

## 2015-11-20 DIAGNOSIS — C090 Malignant neoplasm of tonsillar fossa: Secondary | ICD-10-CM | POA: Diagnosis not present

## 2015-11-20 DIAGNOSIS — R634 Abnormal weight loss: Secondary | ICD-10-CM | POA: Insufficient documentation

## 2015-11-20 DIAGNOSIS — Z7984 Long term (current) use of oral hypoglycemic drugs: Secondary | ICD-10-CM | POA: Insufficient documentation

## 2015-11-20 DIAGNOSIS — Z7982 Long term (current) use of aspirin: Secondary | ICD-10-CM | POA: Insufficient documentation

## 2015-11-20 DIAGNOSIS — Z931 Gastrostomy status: Secondary | ICD-10-CM | POA: Insufficient documentation

## 2015-11-20 MED ORDER — MAGIC MOUTHWASH W/LIDOCAINE: ORAL | 5 refills | Status: DC

## 2015-11-20 NOTE — Progress Notes (Signed)
Radiation Oncology         [phone] ________________________________  Name: Kevin Mcclain MRN: [id number]  Date: 11/20/2015  DOB: 07/10/1945  Follow-Up Visit Note  CC: Garret Reddish, MD  Jodi Marble, MD  Diagnosis and Prior Radiotherapy:       ICD-9-CM ICD-10-CM   1. Malignant neoplasm of tonsillar fossa ([place]) 146.1 C09.0 magic mouthwash w/lidocaine SOLN     TSH  2. Cancer of tonsillar fossa ([facility]CC) 146.1 C09.0   3. 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 The patient followed up with Dr. Erik Obey on [id number]. Oral examination at that time was negative and hypopharynx by mirror examination was negative as well.  The patient reports pain and burning in his throat present since treatment, getting slowly better, but sometimes he has a "bad day". This pain runs from the right side of his mouth straight across his throat to the left side of his mouth. He reports a dry mouth and has to moisten it with food or drinks.  Salagen didn't help. He has difficulty chewing and will take longer to chew certain foods like meat. He is able to taste his food at the beginning of his meal, but loses taste as the meal continues. The patient feels discouraged that he is recovering slowly and still has pain. Using a feeding tube?: Yes, he is using continuous tube feeds at night when he feels like he has not eaten enough during the day. This averages to about 4 nights a week. Using fluoride trays daily? Occasionally. He is using PreviDent toothpaste.  ALLERGIES:  is allergic to  benzonatate; codeine; and sitagliptin phosphate.  Meds: Current Outpatient Prescriptions  Medication Sig Dispense Refill  . aspirin 81 MG tablet Take 81 mg by mouth daily.    Marland Kitchen atorvastatin (LIPITOR) 10 MG tablet Place 1 tablet (10 mg total) into feeding tube daily. 90 tablet 3  . metFORMIN (GLUCOPHAGE) 500 MG tablet Take 1 tablet (500 mg total) by mouth daily with breakfast. 90 tablet 3  . metoprolol succinate (TOPROL-XL) 25 MG 24 hr tablet Take 25 mg by mouth daily.    . Nutritional Supplements (FEEDING SUPPLEMENT, VITAL 1.5 CAL,) LIQD Begin Vital 1.5 at 20 cc/hr continuously on Friday, 1-24.  If tolerated, increase to 25 cc/hr day 3 and 30 cc/hr on Day 4. Flush with 120 cc free water QID plus and additional 720 cc water by mouth daily.  Please send supplies to home and instruct patient on continuous feeding pump. 1422 mL   . ondansetron (ZOFRAN) 4 MG tablet Take 1 tablet (4 mg total) by mouth every 8 (eight) hours as needed for nausea or vomiting. 30 tablet 5  . OVER THE COUNTER MEDICATION Place 1 drop into both eyes 3 (three) times daily as needed (dry eyes/ irritation). Clear Cooling Eye Drops    . oxyCODONE (ROXICODONE) 5 MG/5ML solution Take 10 mLs (10 mg total) by mouth every 4 (four) hours as needed for moderate pain or severe pain. 1750 mL 0  . pantoprazole (PROTONIX) 40 MG  tablet Take 1 tablet (40 mg total) by mouth daily. 30 tablet 5  . polyethylene glycol (MIRALAX / GLYCOLAX) packet Take 17 g by mouth daily as needed.    . sodium fluoride (PREVIDENT 5000 PLUS) 1.1 % CREA dental cream Apply to tooth brush. Brush teeth for 2 minutes. Spit out excess-DO NOT swallow. Repeat nightly. 1 Tube prn  . magic mouthwash w/lidocaine SOLN 1 part nystatin,1 part Maaloxplus,1 part benadryl, 3 parts 2%viscous lidocaine. Swish 70mL for 30 sec & swallow QID. 1000 mL 5  . nitroGLYCERIN (NITROSTAT) 0.4 MG SL tablet Place 1 tablet (0.4 mg total) under the tongue every 5 (five) minutes as needed. (Patient not  taking: Reported on 11/20/2015) 50 tablet 11   No current facility-administered medications for this encounter.     Physical Findings: The patient is in no acute distress. Patient is alert and oriented. Wt Readings from Last 3 Encounters:  11/20/15 127 lb 1.6 oz (57.7 kg)  09/09/15 130 lb (59 kg)  09/05/15 129 lb 11.2 oz (58.8 kg)    height is 5\' 4"  (1.626 m) and weight is 127 lb 1.6 oz (57.7 kg). His temperature is 98.4 F (36.9 C). His blood pressure is 148/70 (abnormal) and his pulse is 74. His oxygen saturation is 100%. .  General: Alert and oriented, in no acute distress HEENT: Head is normocephalic. His palate and posterior oropharyngeal wall is noted for some telangiectasia and an erythematous patch over the left soft palate about 1 cm from the midline. No evidence of thrush or tumor in the mouth or oropharynx. His mucous membranes are somewhat dry. Neck: Neck is supple with no palpable masses in the supraclavicular or cervical regions. 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 Lungs. Clear to auscultation bilaterally. Heart: Regular, rhythm, and rate. No murmur heard.  Abdomen: PEG tube site appears healthy. Skin: Skin in treatment fields shows good healing. Extremities: No cyanosis or edema. Psychiatric: Judgment and insight are intact. Affect is appropriate.  Lab Findings: Lab Results  Component Value Date   WBC 8.2 09/09/2015   HGB 14.0 09/09/2015   HCT 41.3 09/09/2015   MCV 90.2 09/09/2015   PLT 215.0 09/09/2015    Lab Results  Component Value Date   TSH 1.878 03/13/2015    Radiographic Findings: No results found.  Impression/Plan:    1) Head and Neck Cancer Status: NED. PET images from 05/16/15 reviewed and he has had a complete response to therapy. He is continuing oral and PEG tube feedings.  2) Nutritional Status: no active issues    PEG tube: Yes; still supplementing   3) Risk Factors: The patient has been educated about risk factors including   tobacco abuse; they understand that avoidance of tobacco is important to prevent  other cancers  4) Swallowing: Functional.  5) Dental: Encouraged to continue regular followup with dentistry, and dental hygiene including fluoride rinses.    6) Thyroid function: Lab Results  Component Value Date   TSH 1.878 03/13/2015    7) Other: [name], NP refilled the patient's oxycodone recently. He will follow up with her in 3 months, perhaps ready for taper at that time. I will order a TSH for then.  8) I will prescribe Magic Mouthwash with Maalox, nystatin, benadryl and Lidocaine for the patient's continued mouth and throat pain. I instructed him of its use QID for healing and soothing of his mucosa..  _____________________________________   Eppie Gibson, MD  This document serves as a record of services personally  performed by Eppie Gibson, MD. It was created on her behalf by Darcus Austin, a trained medical scribe. The creation of this record is based on the scribe's personal observations and the provider's statements to them. This document has been checked and approved by the attending provider.

## 2015-11-21 ENCOUNTER — Telehealth: Payer: Self-pay | Admitting: *Deleted

## 2015-11-21 MED FILL — MAGIC MW W/LIDO 1:1:1:3: 14 days supply | Qty: 960 | Fill #0

## 2015-11-21 NOTE — Telephone Encounter (Signed)
CALLED PATIENT TO INFORM OF GENETICS APPT. ON 02-21-16 @ 10 AM AND HIS LAB ON 02-21-16 @ 11 AM, SPOKE WITH PATIENT'S WIFE PATRICIA AND SHE IS AWARE OF THESE APPTS.

## 2015-12-11 ENCOUNTER — Other Ambulatory Visit: Payer: Self-pay | Admitting: Adult Health

## 2015-12-11 ENCOUNTER — Telehealth: Payer: Self-pay | Admitting: Adult Health

## 2015-12-11 DIAGNOSIS — C099 Malignant neoplasm of tonsil, unspecified: Secondary | ICD-10-CM

## 2015-12-11 MED ORDER — OXYCODONE HCL 5 MG/5ML PO SOLN: 10.0000 mg | ORAL | 0 refills | Status: DC | PRN

## 2015-12-11 MED FILL — oxyCODONE HCL 5 MG/5ML SOLN: 5 | 30 days supply | Qty: 1750 | Fill #0

## 2015-12-11 NOTE — Telephone Encounter (Signed)
I received a call from Mr. Picardo requesting a refill of his oxycodone pain medication. He tells me that he continues to have burning pain in the back of his throat and in his mouth. He tells me that the last time he saw Dr. Isidore Moos, she prescribed for him to use Magic Mouthwash 4 times a day, but he tells me this was not helpful. He says that he was instructed to swallow the Magic Mouthwash, and this led to diarrhea over the period of the past couple of weeks. Therefore he stopped the Magic Mouthwash and has been using the oxycodone exclusively for pain management. He takes oxycodone about 4x/day; it helps him get enough relief so that he can eat and sleep. His appetite is good, but he tells me that he eats "just a few bites" and doesn't want any more oral intake. He continues on tube feedings. He tells me that his weight is stable, +/- 127 pounds. He tells me he is trying to eat more. I encouraged him to continue to increase his protein and calorie intake, as he continues to heal. He is very frustrated by how long it is taking for him to heal from definitive radiation therapy. I provided reassurance and encouragement today, as Dr. Isidore Moos provided him reassurance that there is no evidence of disease based on her most recent physical exam on 11/20/15.  I checked to the [employer], and it is reasonable for Mr. Domingo Mend to receive a refill of the oxycodone. Below is the record from the database for the past 3 months. I have refilled his oxycodone prescription, and left it in a labeled envelope at the front desk of the [facility] for him to pick up at his convenience. He knows to call me with any other needs or concerns. Based on Dr. Pearlie Oyster most recent visit/recommendations, I will see him for routine follow-up in 01/2016.    [name], NP [place] [phone]

## 2015-12-11 NOTE — Progress Notes (Signed)
See telephone note.   [name], NP [place] [phone]

## 2015-12-26 ENCOUNTER — Ambulatory Visit (INDEPENDENT_AMBULATORY_CARE_PROVIDER_SITE_OTHER): Payer: Medicare Other | Admitting: Family Medicine

## 2015-12-26 ENCOUNTER — Encounter: Payer: Self-pay | Admitting: Family Medicine

## 2015-12-26 VITALS — BP 138/70 | HR 64 | Temp 98.0°F | Resp 18 | Ht 64.0 in | Wt 128.4 lb

## 2015-12-26 DIAGNOSIS — C090 Malignant neoplasm of tonsillar fossa: Secondary | ICD-10-CM

## 2015-12-26 DIAGNOSIS — E1142 Type 2 diabetes mellitus with diabetic polyneuropathy: Secondary | ICD-10-CM | POA: Diagnosis not present

## 2015-12-26 DIAGNOSIS — E782 Mixed hyperlipidemia: Secondary | ICD-10-CM

## 2015-12-26 DIAGNOSIS — R634 Abnormal weight loss: Secondary | ICD-10-CM

## 2015-12-26 DIAGNOSIS — E538 Deficiency of other specified B group vitamins: Secondary | ICD-10-CM

## 2015-12-26 DIAGNOSIS — K219 Gastro-esophageal reflux disease without esophagitis: Secondary | ICD-10-CM

## 2015-12-26 DIAGNOSIS — E1140 Type 2 diabetes mellitus with diabetic neuropathy, unspecified: Secondary | ICD-10-CM | POA: Insufficient documentation

## 2015-12-26 DIAGNOSIS — Z23 Encounter for immunization: Secondary | ICD-10-CM

## 2015-12-26 DIAGNOSIS — R110 Nausea: Secondary | ICD-10-CM

## 2015-12-26 DIAGNOSIS — R1314 Dysphagia, pharyngoesophageal phase: Secondary | ICD-10-CM

## 2015-12-26 MED ORDER — ONDANSETRON HCL 4 MG PO TABS: 4.0000 mg | ORAL_TABLET | Freq: Three times a day (TID) | ORAL | 5 refills | Status: DC | PRN

## 2015-12-26 MED ORDER — PANTOPRAZOLE SODIUM 40 MG PO TBEC: 40.0000 mg | DELAYED_RELEASE_TABLET | Freq: Every day | ORAL | 5 refills | Status: DC

## 2015-12-26 NOTE — Assessment & Plan Note (Signed)
S:a1c controlled last viist and metformin decreased from BID to daily. Complains of neuropathy- see other section.  A/P: update a1c and if 6.5 or less likely stop metformin completely.

## 2015-12-26 NOTE — Assessment & Plan Note (Signed)
S: poorly controlled on no statin previously with LDL >70. Started atorvastatin but only tolerating about 3x a week as upset stomach some with this. No myalgias.  Lab Results  Component Value Date   CHOL 153 09/09/2015   HDL 55.50 09/09/2015   LDLCALC 53 12/04/2013   LDLDIRECT 90.0 09/09/2015   TRIG 58.0 09/09/2015   CHOLHDL 3 12/04/2013   A/P: update direct LDL today- hopeful below 70

## 2015-12-26 NOTE — Progress Notes (Signed)
Subjective:  Kevin Mcclain is a 70 y.o. year old very pleasant male patient who presents for/with See problem oriented charting ROS- no fever, chills, night sweats. Has had some slightly worsened fatigue.see any ROS included in HPI as well.   Past Medical History-  Patient Active Problem List   Diagnosis Date Noted  . G tube feedings ([place]) 06/10/2015    Priority: High  . Cancer of tonsillar fossa ([place] [facility]) 10/31/2014    Priority: High  . Osteoarthritis 11/18/2010    Priority: High  . CAD (coronary artery disease) 10/11/2008    Priority: High  . Type 2 diabetes mellitus with peripheral neuropathy ([place]) 01/24/2007    Priority: High  . Gout 03/28/2007    Priority: Medium  . HYPERLIPIDEMIA 01/24/2007    Priority: Medium  . Essential hypertension 01/24/2007    Priority: Medium  . GERD (gastroesophageal reflux disease) 06/10/2015    Priority: Low  . Arthropathy 03/10/2013    Priority: Low  . Low back pain 11/26/2011    Priority: Low  . Kidney stone 11/04/2011    Priority: Low  . Diabetic neuropathy ([place]) 12/26/2015  . Herpes zoster 07/15/2015    Medications- reviewed and updated Current Outpatient Prescriptions  Medication Sig Dispense Refill  . aspirin 81 MG tablet Take 81 mg by mouth daily.    Marland Kitchen atorvastatin (LIPITOR) 10 MG tablet Place 1 tablet (10 mg total) into feeding tube daily. 90 tablet 3  . metFORMIN (GLUCOPHAGE) 500 MG tablet Take 1 tablet (500 mg total) by mouth daily with breakfast. 90 tablet 3  . metoprolol succinate (TOPROL-XL) 25 MG 24 hr tablet Take 25 mg by mouth daily.    . nitroGLYCERIN (NITROSTAT) 0.4 MG SL tablet Place 1 tablet (0.4 mg total) under the tongue every 5 (five) minutes as needed. 50 tablet 11  . Nutritional Supplements (FEEDING SUPPLEMENT, VITAL 1.5 CAL,) LIQD Begin Vital 1.5 at 20 cc/hr continuously on Friday, 1-24.  If tolerated, increase to 25 cc/hr day 3 and 30 cc/hr on Day 4. Flush with 120 cc free water QID plus and additional 720 cc water  by mouth daily.  Please send supplies to home and instruct patient on continuous feeding pump. 1422 mL   . ondansetron (ZOFRAN) 4 MG tablet Take 1 tablet (4 mg total) by mouth every 8 (eight) hours as needed for nausea or vomiting. 30 tablet 5  . OVER THE COUNTER MEDICATION Place 1 drop into both eyes 3 (three) times daily as needed (dry eyes/ irritation). Clear Cooling Eye Drops    . oxyCODONE (ROXICODONE) 5 MG/5ML solution Take 10 mLs (10 mg total) by mouth every 4 (four) hours as needed for moderate pain or severe pain. 1750 mL 0  . pantoprazole (PROTONIX) 40 MG tablet Take 1 tablet (40 mg total) by mouth daily. 30 tablet 5  . polyethylene glycol (MIRALAX / GLYCOLAX) packet Take 17 g by mouth daily as needed.    . sodium fluoride (PREVIDENT 5000 PLUS) 1.1 % CREA dental cream Apply to tooth brush. Brush teeth for 2 minutes. Spit out excess-DO NOT swallow. Repeat nightly. 1 Tube prn   No current facility-administered medications for this visit.     Objective: BP 138/70 (BP Location: Left Arm, Patient Position: Sitting, Cuff Size: Normal)   Pulse 64   Temp 98 F (36.7 C) (Oral)   Resp 18   Ht 5\' 4"  (1.626 m)   Wt 128 lb 6.1 oz (58.2 kg)   SpO2 98%   BMI 22.04 kg/m  Gen: NAD, resting comfortably Appears anxious when discussing chronic pain and cancer history  Assessment/Plan:  Type 2 diabetes mellitus with peripheral neuropathy ([place]) S:a1c controlled last viist and metformin decreased from BID to daily. Complains of neuropathy- see other section.  A/P: update a1c and if 6.5 or less likely stop metformin completely.    Diabetic neuropathy ([facility]) Chronic pain S: 2 issues- burning in throat and chronic neuropathy  1. Burning in the back of the throat- constant like it is raw, starts on right bottom gum and goes across and burn across back of tongue. On bad days he feels like he has a burning ring around his neck - has been told that it is likely due to the radiation. He has tried magic  mouthwash and didn't really help some. Also has some feelings of fatigue- seems like it is getting worse- no chest pain or shortness of breath. No anemia as of July. TSH normal in January 2017. Some good days and some really bad days as far as fatigue and burning in the throat. Eating as much as he can- tube feeds at time but not always doing overnight anymore. Dry mouth persists  2. Also complains of severe pain in bilateral hands in all 5 fingers. sometimes has almost no feeling and drops things. Also has some burning and aching in hands- oxycodone helps this some. Gabapentin gave him the shakes and had "crazy thoughts" and shakiness. Worried about lyrica causing the same- apparently has tried in the past. Does have history of low b12 and wonders about updating this A/P: some neuropathic pain elements for both- declines trial of gabapentin or lyrica. Also check b12 to make sure not contributing. Has been tried in past and failed. Before radiation he needed an oxycocodone about 4x a week but now also with the severe burning pain in mouth- takes daily 4x a day. Per Dr. Erik Obey- may last up to 4 years after radation (about a year ago). Wife and patient ask about me prescribing if oncology were to stop- discussed I would as long as we had a plan for stopping within given timeframe- if not would refer to pain management (they are strongly opposed to this). Extended counseling needed today on dangers/risks of long term narcotics but also potential benefits in patient with post cancer pain and severe neuropathy  HYPERLIPIDEMIA S: poorly controlled on no statin previously with LDL >70. Started atorvastatin but only tolerating about 3x a week as upset stomach some with this. No myalgias.  Lab Results  Component Value Date   CHOL 153 09/09/2015   HDL 55.50 09/09/2015   LDLCALC 53 12/04/2013   LDLDIRECT 90.0 09/09/2015   TRIG 58.0 09/09/2015   CHOLHDL 3 12/04/2013   A/P: update direct LDL today- hopeful below  70   3-4 months  Orders Placed This Encounter  Procedures  . Flu vaccine HIGH DOSE PF  . LDL cholesterol, direct    [place]  . Hemoglobin A1c      . Vitamin B12    Meds ordered this encounter  Medications  . ondansetron (ZOFRAN) 4 MG tablet    Sig: Take 1 tablet (4 mg total) by mouth every 8 (eight) hours as needed for nausea or vomiting.    Dispense:  30 tablet    Refill:  5  . pantoprazole (PROTONIX) 40 MG tablet    Sig: Take 1 tablet (40 mg total) by mouth daily.    Dispense:  30 tablet    Refill:  5  The duration of face-to-face time during this visit was greater than 30 minutes. Greater than 50% of this time was spent in counseling primarily around chronic pain issues.   Return precautions advised.  Garret Reddish, MD

## 2015-12-26 NOTE — Assessment & Plan Note (Addendum)
Chronic pain S: 2 issues- burning in throat and chronic neuropathy  1. Burning in the back of the throat- constant like it is raw, starts on right bottom gum and goes across and burn across back of tongue. On bad days he feels like he has a burning ring around his neck - has been told that it is likely due to the radiation. He has tried magic mouthwash and didn't really help some. Also has some feelings of fatigue- seems like it is getting worse- no chest pain or shortness of breath. No anemia as of July. TSH normal in January 2017. Some good days and some really bad days as far as fatigue and burning in the throat. Eating as much as he can- tube feeds at time but not always doing overnight anymore. Dry mouth persists  2. Also complains of severe pain in bilateral hands in all 5 fingers. sometimes has almost no feeling and drops things. Also has some burning and aching in hands- oxycodone helps this some. Gabapentin gave him the shakes and had "crazy thoughts" and shakiness. Worried about lyrica causing the same- apparently has tried in the past. Does have history of low b12 and wonders about updating this A/P: some neuropathic pain elements for both- declines trial of gabapentin or lyrica. Also check b12 to make sure not contributing. Has been tried in past and failed. Before radiation he needed an oxycocodone about 4x a week but now also with the severe burning pain in mouth- takes daily 4x a day. Per Dr. Erik Obey- may last up to 4 years after radation (about a year ago). Wife and patient ask about me prescribing if oncology were to stop- discussed I would as long as we had a plan for stopping within given timeframe- if not would refer to pain management (they are strongly opposed to this). Extended counseling needed today on dangers/risks of long term narcotics but also potential benefits in patient with post cancer pain and severe neuropathy

## 2015-12-26 NOTE — Patient Instructions (Addendum)
Check b12, a1c, ldl  Refill ofran and protonix  Hope we can stop metformin  I am willing to help as long as we have a plan to transition off  Narcotics or to pain clinic within a few years

## 2015-12-27 LAB — HEMOGLOBIN A1C: Hgb A1c MFr Bld: 5.7 % (ref 4.6–6.5)

## 2015-12-27 LAB — LDL CHOLESTEROL, DIRECT: Direct LDL: 109 mg/dL

## 2015-12-30 LAB — VITAMIN B12: VITAMIN B 12: 276 pg/mL (ref 211–911)

## 2015-12-31 ENCOUNTER — Telehealth: Payer: Self-pay | Admitting: Family Medicine

## 2015-12-31 NOTE — Telephone Encounter (Signed)
Pt would like results of labs. Please call back. °

## 2016-01-01 NOTE — Telephone Encounter (Signed)
Spoke with patient regarding lab results. 

## 2016-01-08 ENCOUNTER — Telehealth: Payer: Self-pay | Admitting: Adult Health

## 2016-01-08 ENCOUNTER — Other Ambulatory Visit: Payer: Self-pay | Admitting: Adult Health

## 2016-01-08 DIAGNOSIS — C099 Malignant neoplasm of tonsil, unspecified: Secondary | ICD-10-CM

## 2016-01-08 MED ORDER — OXYCODONE HCL 5 MG/5ML PO SOLN: 10.0000 mg | ORAL | 0 refills | Status: DC | PRN

## 2016-01-08 MED FILL — oxyCODONE HCL 5 MG/5ML SOLN: 5 | 30 days supply | Qty: 1750 | Fill #0

## 2016-01-08 NOTE — Telephone Encounter (Signed)
I received a call from Mr. Bossio with questions regarding his upcoming appointment at the cancer center to see me. He also is requesting a refill of his oxycodone pain medication.  I shared with him that he is scheduled to see me on Friday, 02/21/16 at 10 AM. He will have labs after that visit. He is requesting that I check his B12 level, as well as a metabolic panel that will include his blood sugar. He tells me his PCP recently started him on liquid B12 supplementation, and the patient will like to level checked after he has been on it for several weeks. He is also working with his PCP/diabetes specialist on his lip sugar management and would like those lab values as well.  He voiced understanding of his upcoming appointment at cancer center in December. I have refilled his oxycodone, and left the prescription with radiation oncology nursing that he can pick up at his convenience. Encouraged him to call me with any questions or concerns, and I look forward to seeing him soon.   [name], NP [place] [phone]

## 2016-01-17 ENCOUNTER — Other Ambulatory Visit: Payer: Self-pay | Admitting: Internal Medicine

## 2016-01-28 ENCOUNTER — Encounter: Payer: Self-pay | Admitting: Family Medicine

## 2016-01-28 ENCOUNTER — Ambulatory Visit (INDEPENDENT_AMBULATORY_CARE_PROVIDER_SITE_OTHER): Payer: Medicare Other | Admitting: Family Medicine

## 2016-01-28 VITALS — BP 138/68 | HR 73 | Temp 98.3°F | Ht 64.0 in | Wt 126.8 lb

## 2016-01-28 DIAGNOSIS — J0100 Acute maxillary sinusitis, unspecified: Secondary | ICD-10-CM

## 2016-01-28 MED ORDER — AMOXICILLIN-POT CLAVULANATE 250-62.5 MG/5ML PO SUSR: 500.0000 mg | Freq: Three times a day (TID) | ORAL | 0 refills | Status: DC

## 2016-01-28 MED ORDER — HYDROCOD POLST-CPM POLST ER 10-8 MG/5ML PO SUER: 5.0000 mL | Freq: Two times a day (BID) | ORAL | 0 refills | Status: DC | PRN

## 2016-01-28 NOTE — Patient Instructions (Addendum)
Tussionex for cough (per computer not sure about insurance coverage)  Liquid augmentin 3x a day for 7 days (enough for 10 days but only need 7 days)--> take this if symptoms last through Saturday, get better then get worse, or if you have fever- you can go ahead and pick up with the tussionex or have them hold that prescription for you

## 2016-01-28 NOTE — Progress Notes (Signed)
PCP: Garret Reddish, MD  Subjective:  Kevin Mcclain is a 70 y.o. year old very pleasant male patient who presents with sinusitis symptoms including nasal congestion, sinus tenderness -other symptoms include: thick green sputum, mildly better over last 2 days. Ear fullness, sinus pain maxillary and frontal. Bloody nose when blowing- coughed at times after the bloody nose. tussionex very effective for cough in past. Makes soreness in throat after prior cancer more severe.  -day of illness:6 -Symptoms are improving slightly -previous treatments: rest, hydration, tylenol -sick contacts/travel/risks: denies flu exposure.   ROS-denies fever, SOB, NVD, tooth pain  Pertinent Past Medical History-  Patient Active Problem List   Diagnosis Date Noted  . G tube feedings ([place]) 06/10/2015    Priority: High  . Cancer of tonsillar fossa ([place]) 10/31/2014    Priority: High  . Osteoarthritis 11/18/2010    Priority: High  . CAD (coronary artery disease) 10/11/2008    Priority: High  . Type 2 diabetes mellitus with peripheral neuropathy ([place]) 01/24/2007    Priority: High  . Gout 03/28/2007    Priority: Medium  . HYPERLIPIDEMIA 01/24/2007    Priority: Medium  . Essential hypertension 01/24/2007    Priority: Medium  . GERD (gastroesophageal reflux disease) 06/10/2015    Priority: Low  . Arthropathy 03/10/2013    Priority: Low  . Low back pain 11/26/2011    Priority: Low  . Kidney stone 11/04/2011    Priority: Low  . Diabetic neuropathy ([place]) 12/26/2015  . Herpes zoster 07/15/2015    Medications- reviewed  Current Outpatient Prescriptions  Medication Sig Dispense Refill  . aspirin 81 MG tablet Take 81 mg by mouth daily.    Marland Kitchen atorvastatin (LIPITOR) 10 MG tablet Place 1 tablet (10 mg total) into feeding tube daily. 90 tablet 3  . metoprolol succinate (TOPROL-XL) 25 MG 24 hr tablet Take 25 mg by mouth daily.    . nitroGLYCERIN (NITROSTAT) 0.4 MG SL tablet Place 1 tablet (0.4 mg total) under  the tongue every 5 (five) minutes as needed. 50 tablet 11  . Nutritional Supplements (FEEDING SUPPLEMENT, VITAL 1.5 CAL,) LIQD Begin Vital 1.5 at 20 cc/hr continuously on Friday, 1-24.  If tolerated, increase to 25 cc/hr day 3 and 30 cc/hr on Day 4. Flush with 120 cc free water QID plus and additional 720 cc water by mouth daily.  Please send supplies to home and instruct patient on continuous feeding pump. 1422 mL   . ondansetron (ZOFRAN) 4 MG tablet Take 1 tablet (4 mg total) by mouth every 8 (eight) hours as needed for nausea or vomiting. 30 tablet 5  . OVER THE COUNTER MEDICATION Place 1 drop into both eyes 3 (three) times daily as needed (dry eyes/ irritation). Clear Cooling Eye Drops    . oxyCODONE (ROXICODONE) 5 MG/5ML solution Take 10 mLs (10 mg total) by mouth every 4 (four) hours as needed for moderate pain or severe pain. 1750 mL 0  . pantoprazole (PROTONIX) 40 MG tablet Take 1 tablet (40 mg total) by mouth daily. 30 tablet 5  . polyethylene glycol (MIRALAX / GLYCOLAX) packet Take 17 g by mouth daily as needed.    . sodium fluoride (PREVIDENT 5000 PLUS) 1.1 % CREA dental cream Apply to tooth brush. Brush teeth for 2 minutes. Spit out excess-DO NOT swallow. Repeat nightly. 1 Tube prn  . amoxicillin-clavulanate (AUGMENTIN) 250-62.5 MG/5ML suspension Take 10 mLs (500 mg total) by mouth 3 (three) times daily. 300 mL 0  . chlorpheniramine-HYDROcodone (TUSSIONEX PENNKINETIC ER)  10-8 MG/5ML SUER Take 5 mLs by mouth every 12 (twelve) hours as needed for cough. 140 mL 0  . metFORMIN (GLUCOPHAGE) 500 MG tablet Take 1 tablet (500 mg total) by mouth daily with breakfast. (Patient not taking: Reported on 01/28/2016) 90 tablet 3   No current facility-administered medications for this visit.     Objective: BP 138/68 (BP Location: Left Arm, Patient Position: Sitting, Cuff Size: Normal)   Pulse 73   Temp 98.3 F (36.8 C) (Oral)   Ht 5\' 4"  (1.626 m)   Wt 126 lb 12.8 oz (57.5 kg)   SpO2 97%   BMI  21.77 kg/m  Gen: NAD, resting comfortably HEENT: Turbinates erythematous with green drainage and friable nares with some dried blood, TM normal, pharynx mildly erythematous with no tonsilar exudate or edema, maxillary sinus tenderness CV: RRR no murmurs rubs or gallops Lungs: CTAB no crackles, wheeze, rhonchi Abdomen: soft/nontender/nondistended/normal bowel sounds. No rebound or guarding.  Ext: no edema Skin: warm, dry, no rash Neuro: grossly normal, moves all extremities  Assessment/Plan:  Sinsusitis Viral based on <10 days, no double sickening, lack of severity of symptoms in first 3 days. Educated on signs that bacterial infection may have developed (symptoms over 10 days, double sickening).   Treatment: -considered steroid: we opted out -other symptomatic care with tussionex per patient request- will also help with soreness in mouth/throat (no strep on exam) -Antibiotic indicated: no, but I will be out of office on day 10 so advised of rasons to take Augmentin liquid- " if symptoms last through Saturday, get better then get worse, or if you have fever"  Finally, we reviewed reasons to return to care including if symptoms worsen or persist or new concerns arise (particularly fever or shortness of breath)  Meds ordered this encounter  Medications  . chlorpheniramine-HYDROcodone (TUSSIONEX PENNKINETIC ER) 10-8 MG/5ML SUER    Sig: Take 5 mLs by mouth every 12 (twelve) hours as needed for cough.    Dispense:  140 mL    Refill:  0  . amoxicillin-clavulanate (AUGMENTIN) 250-62.5 MG/5ML suspension    Sig: Take 10 mLs (500 mg total) by mouth 3 (three) times daily.    Dispense:  300 mL    Refill:  0  counseled on high risk medication use- should avoid driving for 8 hours afte rtaking at least  Garret Reddish, MD

## 2016-01-28 NOTE — Progress Notes (Signed)
Pre visit review using our clinic review tool, if applicable. No additional management support is needed unless otherwise documented below in the visit note. 

## 2016-02-05 ENCOUNTER — Other Ambulatory Visit: Payer: Self-pay | Admitting: Adult Health

## 2016-02-05 ENCOUNTER — Other Ambulatory Visit: Payer: Self-pay

## 2016-02-05 ENCOUNTER — Telehealth: Payer: Self-pay | Admitting: Family Medicine

## 2016-02-05 ENCOUNTER — Telehealth: Payer: Self-pay | Admitting: Adult Health

## 2016-02-05 DIAGNOSIS — C099 Malignant neoplasm of tonsil, unspecified: Secondary | ICD-10-CM

## 2016-02-05 MED ORDER — OXYCODONE HCL 5 MG/5ML PO SOLN: 10.0000 mg | ORAL | 0 refills | Status: DC | PRN

## 2016-02-05 MED ORDER — METFORMIN HCL 500 MG PO TABS: 500.0000 mg | ORAL_TABLET | Freq: Every day | ORAL | 3 refills | Status: DC

## 2016-02-05 MED FILL — oxyCODONE HCL 5 MG/5ML SOLN: 5 | 30 days supply | Qty: 1750 | Fill #0

## 2016-02-05 NOTE — Telephone Encounter (Signed)
Received voicemail from patient requesting refill for oxycodone.  States he recently had a sinus infection and saw his PCP to help manage this, but the infection has worsened his pain.   Oxycodone refilled.  Anderson Malta, RN in [employer] retrieved prescription and patient will pick it up in [place].   Below is info from [facility] Controlled Substance Registry

## 2016-02-05 NOTE — Telephone Encounter (Signed)
Prescription sent to pharmacy as requested.

## 2016-02-05 NOTE — Telephone Encounter (Signed)
Pt request refill  metFORMIN (GLUCOPHAGE) 500 MG tablet  Pt states his blood sugars running high, and he forgot to ask for a refill at his last visit.  [facility] [address]

## 2016-02-21 ENCOUNTER — Ambulatory Visit (HOSPITAL_BASED_OUTPATIENT_CLINIC_OR_DEPARTMENT_OTHER): Payer: Medicare Other | Admitting: Adult Health

## 2016-02-21 ENCOUNTER — Ambulatory Visit: Payer: Medicare Other | Attending: Radiation Oncology

## 2016-02-21 VITALS — BP 156/61 | HR 68 | Temp 97.9°F | Resp 17 | Ht 64.0 in | Wt 122.6 lb

## 2016-02-21 DIAGNOSIS — C099 Malignant neoplasm of tonsil, unspecified: Secondary | ICD-10-CM | POA: Diagnosis not present

## 2016-02-21 DIAGNOSIS — R059 Cough, unspecified: Secondary | ICD-10-CM

## 2016-02-21 DIAGNOSIS — R112 Nausea with vomiting, unspecified: Secondary | ICD-10-CM | POA: Diagnosis not present

## 2016-02-21 DIAGNOSIS — R070 Pain in throat: Secondary | ICD-10-CM | POA: Diagnosis not present

## 2016-02-21 DIAGNOSIS — R05 Cough: Secondary | ICD-10-CM | POA: Diagnosis not present

## 2016-02-21 DIAGNOSIS — R5383 Other fatigue: Secondary | ICD-10-CM

## 2016-02-21 MED ORDER — PROCHLORPERAZINE MALEATE 10 MG PO TABS: 10.0000 mg | ORAL_TABLET | Freq: Four times a day (QID) | ORAL | 4 refills | Status: DC | PRN

## 2016-02-21 MED ORDER — OXYCODONE HCL 5 MG/5ML PO SOLN: 15.0000 mg | ORAL | 0 refills | Status: DC | PRN

## 2016-02-21 MED ORDER — LORAZEPAM 0.5 MG PO TABS: 0.5000 mg | ORAL_TABLET | Freq: Three times a day (TID) | ORAL | 0 refills | Status: DC | PRN

## 2016-02-22 ENCOUNTER — Encounter: Payer: Self-pay | Admitting: Adult Health

## 2016-02-22 NOTE — Progress Notes (Signed)
CLINIC:  Survivorship  REASON FOR VISIT:  Routine follow-up for head & neck cancer.  BRIEF ONCOLOGIC HISTORY:  Oncology History   Tonsil cancer   Staging form: Pharynx - Oropharynx, AJCC 7th Edition     Clinical: Stage II (T2, N0, M0) - Signed by Eppie Gibson, MD on 10/31/2014       Cancer of tonsillar fossa ([place])   12/04/2013 Miscellaneous    TSH: 2.65; no h/o hypothyroidism.      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       11/06/2014 Imaging    CT neck: Right palatine tonsil mass measuring 2.1 x 2.1 x 2.5 cm.  Asymmetric right LNs in level 1B, 2, and level 3.       12/03/2014 - 01/18/2015 Radiation Therapy    Right tonsil and bilateral neck / 70 Gy in 35 fractions to gross disease, 63 Gy in 35 fractions to high risk nodal echelons, and 56 Gy in 35 fractions to intermediate risk nodal echelons      01/06/2015 Miscellaneous    ER VISIT for dysphagia.  No hospital admission. CT neck done and pt was able to tolerate po liquids afer IV zofran. Discharged home.       01/06/2015 Imaging    CT neck: Significant interval treatment response with no residual measurable right tonsil mass. Mild mucosal thickening and hyperenhancement in bilateral oropharynx & epiglottis, likely tx effect. No cervical adenpathy.      04/25/2015 Procedure    PEG placement       05/16/2015 PET scan    Restaging PET scan: Interval response to therapy. Resolution of hypermetabolism associated with right tonsillar lesion. No evidence of residual or recurrence of tumor or metastatic disease.        INTERVAL HISTORY:  Mr. Brisk presents today with his wife for routine follow-up.   He is very  concerned about several symptoms he has been having for the past 2-3 months.  His wife tells me that she noticed that [name] "has not been himself" since mid-October when she noticed he was having nausea & vomiting with minimal exertion; just walking around would leave him overly fatigued.  This is new, as he reports feeling pretty good over the summer.  These episodes of N&V with exertion were happening periodically until Thanksgiving, when he had a recent sinus/upper respiratory infection.  He was given a prescription for Augmentin from his PCP, but was unable to take it because "it made me so sick and throw up that I couldn't take it."  The coughing during this illness made his throat hurt very bad.  He continues to have an intermittent cough.  The nausea and vomiting also worsened in the past 10 days or so, where he has been vomiting daily.  The Protonix and Zofran have not been helping control his symptoms.    His throat pain remains worse than it was about 6 months ago.  "It feels like I just had radiation with the burning in my mouth and throat."  His pain remains 6/10 all the time; the 10 mg of oxycodone is only minimally effective and he has been taking the medication  every 4 hours for the past couple of months; (his oxycodone usage has increased in the past 6 months as well).    He is also concerned about swelling in his neck.  He wants me to look at this today.  He's lost about 4 pounds since his last visit to the cancer center about 3 months ago.  He tries to each as much as he can by mouth.  His wife tells me that he has not used his feeding tube in about 3 months, until the past week or so, when he had to restart using his G-tube for feeding.     Pain: Throat pain worsening  Nutritional Status:  -Intake/Diet: Regular diet, as tolerated, until about 2 weeks ago, when he restarted using some tube feeding intermittently.  -Using a feeding tube? Yes -Weight change: LOSS ~5 lbs since 11/20/15    Swallowing:  Burning throat pain  Last ENT visit: Saw [facility] in [id number]  Last TSH:   TSH 1.878 on 03/13/15    ADDITIONAL REVIEW OF SYSTEMS:  Review of Systems  Constitutional: Positive for malaise/fatigue.  HENT: Positive for sore throat.        Throat pain worse in past few months   Eyes: Negative.   Respiratory: Negative.   Cardiovascular: Negative.   Gastrointestinal: Positive for nausea and vomiting. Negative for constipation.  Genitourinary: Negative.   Musculoskeletal: Negative.   Skin: Negative.   Neurological: Negative.   Endo/Heme/Allergies: Negative.   Psychiatric/Behavioral: The patient is nervous/anxious.      CURRENT MEDICATIONS:  Current Outpatient Prescriptions on File Prior to Visit  Medication Sig Dispense Refill  . aspirin 81 MG tablet Take 81 mg by mouth daily.    . metFORMIN (GLUCOPHAGE) 500 MG tablet Take 1 tablet (500 mg total) by mouth daily with breakfast. 90 tablet 3  . metoprolol succinate (TOPROL-XL) 25 MG 24 hr tablet Take 25 mg by mouth daily.    . Nutritional Supplements (FEEDING SUPPLEMENT, VITAL 1.5 CAL,) LIQD Begin Vital 1.5 at 20 cc/hr continuously on Friday, 1-24.  If tolerated, increase to 25 cc/hr day 3 and 30 cc/hr on Day 4. Flush with 120 cc free water QID plus and additional 720 cc water by mouth daily.  Please send supplies to home and instruct patient on continuous feeding pump. 1422 mL   . ondansetron (ZOFRAN) 4 MG tablet Take 1 tablet (4 mg total) by mouth every 8 (eight) hours as needed for nausea or vomiting. 30 tablet 5  . OVER THE COUNTER MEDICATION Place 1 drop into both eyes 3 (three) times daily as needed (dry eyes/ irritation). Clear Cooling Eye Drops    . pantoprazole (PROTONIX) 40 MG tablet Take 1 tablet (40 mg total) by mouth daily. 30 tablet 5  . polyethylene glycol (MIRALAX / GLYCOLAX) packet Take 17 g by mouth daily as needed.    . sodium fluoride (PREVIDENT 5000 PLUS) 1.1 % CREA dental cream Apply to tooth brush. Brush  teeth for 2 minutes. Spit out excess-DO NOT swallow. Repeat nightly. 1 Tube prn  . amoxicillin-clavulanate (AUGMENTIN) 250-62.5 MG/5ML suspension Take 10 mLs (500 mg total) by mouth 3 (three) times daily. (Patient not taking: Reported on 02/21/2016) 300 mL 0  . atorvastatin (LIPITOR) 10 MG tablet Place 1 tablet (10 mg total) into feeding tube daily. (Patient not taking: Reported on 02/21/2016) 90 tablet 3  . chlorpheniramine-HYDROcodone (TUSSIONEX PENNKINETIC ER) 10-8 MG/5ML SUER Take 5 mLs by mouth every 12 (twelve) hours as needed for cough. (Patient  not taking: Reported on 02/21/2016) 140 mL 0  . nitroGLYCERIN (NITROSTAT) 0.4 MG SL tablet Place 1 tablet (0.4 mg total) under the tongue every 5 (five) minutes as needed. (Patient not taking: Reported on 02/21/2016) 50 tablet 11   No current facility-administered medications on file prior to visit.     ALLERGIES:  Allergies  Allergen Reactions  . Benzonatate Swelling    "swells my throat"  . Codeine Itching  . Sitagliptin Phosphate Itching    IV FLUID ADMINISTRATION RECORD: Date Volume & IVF  02/07/15 1L NS  02/01/15 1L NS  01/30/15 1L NS   01/29/15 1L NS  01/28/15 (IVF recommended, but pt declined)  01/25/15 1L NS + 30 meq KCl  01/23/15 1L NS + 30 meq KCl  01/22/15 1L NS + 30 meq KCl (+ IV Zofran)  01/18/15 (last day of tx) 1L NS + 30 meq KCl  01/17/15 (during tx) 1L NS + 30 meq KCl  01/16/15 (during tx) 1L NS + 30 meq KCl   01/15/15 (during tx) 1L NS   01/14/15 (during tx) 1L NS   01/12/15 (during tx) 1L NS   01/10/15 (during tx) 1L NS   01/08/15 (during tx) 1L NS            PHYSICAL EXAM:  Vitals:   02/21/16 0949  BP: (!) 156/61  Pulse: 68  Resp: 17  Temp: 97.9 F (36.6 C)   Filed Weights   02/21/16 0949  Weight: 122 lb 9.6 oz (55.6 kg)    Weight Date  122 lb 9.6 oz (55.6 kg) 02/21/16  127 lb 1.6 oz (57.7 kg) 11/20/15  129 lb 11.2 oz (58.832 kg) 09/05/15  130 lb 6.4 oz  (59.149 kg) 07/25/15  125 lb 14.4 oz (57.108 kg) 05/30/15  120 lb 8 oz (54.658 kg) 05/17/15  115 lb 3.2 oz (52.254 kg) 05/03/15  114 lb 8 oz (51.937 kg) 04/29/15  115 lb 4.8 oz (52.3 kg) 04/23/15  115 lb 12.8 oz (52.527 kg) 04/15/15  119 lb 9.6 oz (54.25 kg) 03/13/15  121 lb 8 oz (55.112 kg) 02/26/15  120 lb 6.4 oz (54.613 kg) 02/18/15  122 lb 8 oz (55.566 kg) 02/11/15  124 lb 9.6 oz (56.518 kg) 02/07/15  125 lb 9.6 oz (56.972 kg) 02/04/15  127 lb 14.4 oz (58.015 kg) 01/28/15  130 lb 6.4 oz (59.149 kg) 01/21/15  132 lb 3.2 oz (59.966 kg) 01/14/15  136 lb 12.8 oz (62.052 kg) 01/07/15  142 lb 12.8 oz (64.774 kg) 12/24/14  154 lb 9.6 oz (70.126 kg) Pre-treatment: 10/31/14          General: Thin male in no distress. Accompanied by his wife.  HEENT: Head is atraumatic and normocephalic.  Pupils equal and reactive to light. Conjunctivae clear without exudate.  Sclerae anicteric.  Mucous membranes are pink and moist. Tongue is dry. Posterior oropharynx mildly erythematous with telangectasia, consistent with post-radiation changes.   Lymph: No cervical, supraclavicular, or infraclavicular lymphadenopathy.  Neck: No palpable masses. 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  Skin on neck is dry.  Cardiovascular: Normal rate and regular rhythm. Respiratory: Clear to auscultation bilaterally. Chest expansion symmetric; breathing non-labored. GI: Abdomen soft and flat. No tenderness to palpation. Bowel sounds normoactive. G-tube in place.  Neuro: No focal deficits. Steady gait.  Psych: Normal mood and affect for situation.  Skin: Warm and dry.   LABORATORY DATA:  None at this visit.  DIAGNOSTIC IMAGING:  None at this visit.  ASSESSMENT & PLAN:  Mr. Spinelli is a pleasant 70 y.o. male with history of cancer of the right tonsil, treated with IMRT alone; completed treatment on 01/18/15. Patient presents to survivorship clinic today for routine follow-up.  1.  Cancer of the right tonsil:  Mr. Karlberg continues to recover from definitive treatment with radiation therapy to his head and neck.  Clinically, there do not appear to be any signs of recurrence. However, I am concerned with his worsening sore throat  in recent months, weight loss, cough, fatigue/malaise, and nausea & vomiting. Therefore, I have placed orders to CT neck, chest, and abdomen to be done sometime within the next couple of weeks.  He will return to cancer center in 2 months to follow-up on his symptoms.    2. Nauseas & vomiting, worsening:  During Mr. Quinney treatment and acute recovery, he experienced significant nausea & vomiting.  He had GI evaluation with EGD and esophageal dilation with Dr. Hilarie Fredrickson; it was recommended he maintain Protonix and take Zofran as needed. He has lost 5 lbs as a result of the N&V, which is concerning.  CT abdomen ordered for evaluation.  I explained to Mr. Konkle that we will get the results of the CT abdomen and then I will likely refer him back to see Dr. Raquel James for additional evaluation.  I think anxiety could be playing a component in his nausea & vomiting. Therefore, I gave him a prescription for Ativan 0.5 mg po Q8Hprn, #90, no refills.  Since he doesn't feel like the Zofran is very effective any longer, I also gave him a prescription for Compazaine, 10 mg po Q6Hprn, #90, 4 refills.   3. Cough: He has had a persistent cough for the past several weeks. Certainly the cough could be secondary to what sounds like a recent viral illness. However, head & neck cancers often metastasize to the lungs, so I will order CT chest for further evaluation. I have low suspicion the results will reveal metastatic disease since his restaging PET scan in 05/2015 was negative, but he is anxious about his symptoms being related to his cancer.    4. Pain management: His throat pain has recently worsened.  This could be secondary to recent upper respiratory illness, but  have ordered CT imaging to ensure there is no concerns for malignancy contributing to his worsening pain.  He remains on Oxycodone alone for his pain management; I have increased the dose from 10 mg to 15 mg every 4 hours as needed. I recommended he try to take the 10 mg for pain 5-7/10, then take 15 mg for severe pain 8-10/10.  Prescription given today for Oxycodone 5mg /20mL, take 10-15 mL po Q4Hprn, #2020mL, no refills.  I think there could be a neuropathic component to his pain given the burning and expressed radiating pain; however, he was unable to tolerate gabapentin in the past because "it gave me the shakes and made me think crazy thoughts."    5. Bowel regimen/Constipation prevention: Encouraged him to continue his bowel regimen and he understands he may need to increase his bowel regimen with increase in prescription pain meds. He understands he should have a bowel movement every day or every other day, especially while taking opiates.     Dispo:  -Return to cancer center in 2 months to follow-up on his symptoms & after CT scans are completed. I'll recheck TSH at that visit.  -He knows to call me with any questions or concerns before that time.  A total of 35 minutes was spent in face-to-face care for this patient, with greater than 50% of that time in counseling and care-coordination.    [name], NP [place] [phone]

## 2016-03-03 MED FILL — oxyCODONE HCL 5 MG/5ML SOLN: 5 | 22 days supply | Qty: 2000 | Fill #0

## 2016-03-05 ENCOUNTER — Other Ambulatory Visit: Payer: Self-pay | Admitting: Adult Health

## 2016-03-05 DIAGNOSIS — C099 Malignant neoplasm of tonsil, unspecified: Secondary | ICD-10-CM

## 2016-03-06 ENCOUNTER — Ambulatory Visit (HOSPITAL_COMMUNITY): Admission: RE | Admit: 2016-03-06 | Payer: Medicare HMO | Source: Ambulatory Visit

## 2016-03-12 ENCOUNTER — Telehealth: Payer: Self-pay | Admitting: Adult Health

## 2016-03-12 NOTE — Telephone Encounter (Signed)
I received a call from Mr. Yokel with questions regarding the rescheduling of his CT scans. I returned his call and verified that the CT of the chest, neck, and abdomen have all been rescheduled to 03/18/16 at 10 AM. I reinforced the instructions that he should be NPO 4 hours before his scans. I also advised him to follow the instructions given to him by the radiology department regarding the oral contrast for CT of the abdomen. Reminded him that he will get an IV placed for the IV portion of the contrast for the scans. Encouraged him to call me with other questions or concerns. I will certainly give him a call with the results whenever they're made available to me.   [name], NP [place] [phone]

## 2016-03-17 ENCOUNTER — Telehealth: Payer: Self-pay | Admitting: Hematology and Oncology

## 2016-03-17 NOTE — Telephone Encounter (Signed)
Mailed records to [facility] risk adjustment °

## 2016-03-18 ENCOUNTER — Ambulatory Visit (HOSPITAL_COMMUNITY): Payer: Medicare HMO

## 2016-03-23 ENCOUNTER — Ambulatory Visit (HOSPITAL_COMMUNITY)
Admission: RE | Admit: 2016-03-23 | Discharge: 2016-03-23 | Disposition: A | Discharge status: Home / Self Care | Payer: Medicare HMO | Source: Ambulatory Visit | Attending: Adult Health | Admitting: Adult Health

## 2016-03-23 DIAGNOSIS — M5136 Other intervertebral disc degeneration, lumbar region: Secondary | ICD-10-CM | POA: Insufficient documentation

## 2016-03-23 DIAGNOSIS — M47896 Other spondylosis, lumbar region: Secondary | ICD-10-CM | POA: Insufficient documentation

## 2016-03-23 DIAGNOSIS — I2510 Atherosclerotic heart disease of native coronary artery without angina pectoris: Secondary | ICD-10-CM | POA: Insufficient documentation

## 2016-03-23 DIAGNOSIS — I700 Atherosclerosis of aorta: Secondary | ICD-10-CM | POA: Insufficient documentation

## 2016-03-23 DIAGNOSIS — R5383 Other fatigue: Secondary | ICD-10-CM | POA: Diagnosis not present

## 2016-03-23 DIAGNOSIS — I712 Thoracic aortic aneurysm, without rupture: Secondary | ICD-10-CM | POA: Diagnosis not present

## 2016-03-23 DIAGNOSIS — R059 Cough, unspecified: Secondary | ICD-10-CM

## 2016-03-23 DIAGNOSIS — C099 Malignant neoplasm of tonsil, unspecified: Secondary | ICD-10-CM | POA: Insufficient documentation

## 2016-03-23 DIAGNOSIS — R112 Nausea with vomiting, unspecified: Secondary | ICD-10-CM | POA: Insufficient documentation

## 2016-03-23 DIAGNOSIS — K1121 Acute sialoadenitis: Secondary | ICD-10-CM | POA: Insufficient documentation

## 2016-03-23 DIAGNOSIS — N281 Cyst of kidney, acquired: Secondary | ICD-10-CM | POA: Diagnosis not present

## 2016-03-23 DIAGNOSIS — R05 Cough: Secondary | ICD-10-CM | POA: Insufficient documentation

## 2016-03-23 DIAGNOSIS — Z931 Gastrostomy status: Secondary | ICD-10-CM | POA: Insufficient documentation

## 2016-03-23 LAB — POCT I-STAT CREATININE: CREATININE: 0.8 mg/dL (ref 0.61–1.24)

## 2016-03-23 MED ORDER — IOPAMIDOL (ISOVUE-300) INJECTION 61%
100.0000 mL | Freq: Once | INTRAVENOUS | Status: AC | PRN
  Administered 2016-03-23: 100 mL via INTRAVENOUS

## 2016-03-25 ENCOUNTER — Encounter: Payer: Self-pay | Admitting: Family Medicine

## 2016-03-25 ENCOUNTER — Telehealth (HOSPITAL_COMMUNITY): Payer: Self-pay | Admitting: Adult Health

## 2016-03-25 ENCOUNTER — Other Ambulatory Visit (HOSPITAL_COMMUNITY): Payer: Self-pay | Admitting: Adult Health

## 2016-03-25 ENCOUNTER — Telehealth: Payer: Self-pay | Admitting: *Deleted

## 2016-03-25 DIAGNOSIS — I7121 Aneurysm of the ascending aorta, without rupture: Secondary | ICD-10-CM | POA: Insufficient documentation

## 2016-03-25 DIAGNOSIS — C099 Malignant neoplasm of tonsil, unspecified: Secondary | ICD-10-CM

## 2016-03-25 DIAGNOSIS — I712 Thoracic aortic aneurysm, without rupture: Secondary | ICD-10-CM | POA: Insufficient documentation

## 2016-03-25 HISTORY — DX: Aneurysm of the ascending aorta, without rupture: I71.21

## 2016-03-25 MED ORDER — OXYCODONE HCL 5 MG PO TABS: 10.0000 mg | ORAL_TABLET | Freq: Four times a day (QID) | ORAL | 0 refills | Status: DC | PRN

## 2016-03-25 MED FILL — oxyCODONE HCL 5 MG TABS: 5 | 7 days supply | Qty: 90 | Fill #0

## 2016-03-25 NOTE — Telephone Encounter (Signed)
Attempted to reach patient to discuss the results of his recent CT scans.  Unable to speak with patient, but left a voicemail and asked him to return my call here at [facility] at [phone] and ask to speak with one of the nurses and they would alert me.    I spoke with Gayleen Orem, H&N Navigator and he is helping facilitate follow-up appt with Dr. Erik Obey (ENT).   [name], NP [place] [phone]

## 2016-03-25 NOTE — Telephone Encounter (Signed)
Oncology Nurse Navigator Documentation  Per Survivorship NP [place] guidance, called [facility] ENT to arrange appointment.  Spoke with  Michel Bickers, requested patient be contacted and next available appt arranged with Dr. Erik Obey to discuss findings of 03/23/16 CT neck.    She verbalized understanding.  Gayleen Orem, RN, BSN, [place] [facility] Oncology Nurse [place] at [place] [phone]

## 2016-03-25 NOTE — Telephone Encounter (Signed)
I spoke with Kevin Mcclain and Kevin Mcclain regarding the results of Kevin recent CT scans.  I shared with him that there was no evidence of malignancy to Kevin neck, chest, or abdomen.  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 I shared with him that I think it is important that he follow-up with Kevin PCP, who can then place a referral if needed.  We discussed that generally aneurysms can be monitored with annual CT or MRI imaging for continued monitoring.    With regards to the CT neck findings, I discussed them with Dr. Isidore Moos who agreed that he should see Kevin ENT physician when they have availability.  There is no evidence of recurrent disease or metastatic disease; there is evidence of inflammation & pharyngitis, which could be secondary to radiation or infectious per radiology report.  I spoke with Kevin Mcclain, H&N Navigator previously who will help facilitate getting him an appointment with Kevin Mcclain, who is the patient's ENT physician (previously charted that Kevin ENT physician was Dr. Janace Hoard in error).    Kevin Mcclain is also asking for a refill of Kevin oxycodone.  He is asking that I switch Kevin oxycodone from the liquid to pills, which I am happy to do.  My hope is that if Kevin throat pain is infectious, Kevin Mcclain will be able to help further. Paper prescription prepared and left at front desk for patient to pick-up at [facility].   -Oxycodone 10-15 mg po Q6Hprn, #90.    Kevin Craze, NP [place] [phone]

## 2016-03-25 NOTE — Progress Notes (Signed)
I spoke with [name] and his wife regarding the results of his recent CT scans.  I shared with him that there was no evidence of malignancy to his neck, chest, or abdomen.  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 I shared with him that I think it is important that he follow-up with his PCP, who can then place a referral if needed.  We discussed that generally aneurysms can be monitored with annual CT or MRI imaging for continued monitoring.    With regards to the CT neck findings, I discussed them with Dr. Isidore Moos who agreed that he should see his ENT physician when they have availability.  There is no evidence of recurrent disease or metastatic disease; there is evidence of inflammation & pharyngitis, which could be secondary to radiation or infectious per radiology report.  I spoke with Kevin Mcclain, H&N Navigator previously who will help facilitate getting him an appointment with [name], who is the patient's ENT physician (previously charted that his ENT physician was Dr. Janace Mcclain in error).    Kevin Mcclain is also asking for a refill of his oxycodone.  He is asking that I switch his oxycodone from the liquid to pills, which I am happy to do.  My hope is that if his throat pain is infectious, [name] will be able to help further. Paper prescription prepared and left at front desk for patient to pick-up at [facility].   -Oxycodone 10-15 mg po Q6Hprn, #90.    [name], NP [place] [phone]

## 2016-03-27 ENCOUNTER — Other Ambulatory Visit (HOSPITAL_COMMUNITY): Payer: Self-pay | Admitting: Adult Health

## 2016-03-27 ENCOUNTER — Ambulatory Visit: Payer: Self-pay | Admitting: Family Medicine

## 2016-03-27 ENCOUNTER — Telehealth: Payer: Self-pay | Admitting: Adult Health

## 2016-03-27 DIAGNOSIS — C099 Malignant neoplasm of tonsil, unspecified: Secondary | ICD-10-CM

## 2016-03-27 MED ORDER — OXYCODONE HCL 5 MG/5ML PO SOLN: 10.0000 mg | ORAL | 0 refills | Status: DC | PRN

## 2016-03-27 MED FILL — oxyCODONE HCL 5 MG/5ML SOLN: 5 | 29 days supply | Qty: 1750 | Fill #0

## 2016-03-27 NOTE — Telephone Encounter (Signed)
Patient attempted to contact Kevin Craze, NP at [facility] and was told she is only there on Tuesdays.  He tells me that he tried taking the Oxycodone tablets she prescribed for his pain, but they were much more nauseating than the Oxycodone solution that he previously got and he wants to know if he can get a prescription of the solution instead.  He really wants to talk Kevin Mcclain and discuss this with her.  Best number to contact him is [phone].  I assured patient that I would get the message to [facility] and she would contact him about changing out the medications.     Charlestine Massed, NP

## 2016-03-27 NOTE — Telephone Encounter (Signed)
Received message from Charlestine Massed, NP re: Mr. [place].   I returned Mr. Stemm call.  He reiterated that the nausea has been worse since transitioning from the Oxycodone solution to tablets.  He has tried taken only 10 mg at a time since picking up the prescription on Wednesday, "and the nausea just keeps getting worse. I can't take it anymore."  Therefore, he called requesting a new prescription for the oxycodone solution (his previous medication/dosing).   I am happy to give him a new prescription.  Advised him that I would put notation on the prescription to fill early, but cautioned him there may still be issues with his insurance covering the cost of the oxycodone solution.  Verbal understanding from patient.   I will write for the oxycodone solution and leave at the front desk at [facility].  He knows where to come to pick up the prescription and show photo ID to sign for the prescription.  Encouraged him to call with other questions or concerns.    [name], NP [place] [phone]

## 2016-04-01 DIAGNOSIS — Z85818 Personal history of malignant neoplasm of other sites of lip, oral cavity, and pharynx: Secondary | ICD-10-CM | POA: Diagnosis not present

## 2016-04-02 ENCOUNTER — Encounter: Payer: Self-pay | Admitting: Family Medicine

## 2016-04-02 ENCOUNTER — Ambulatory Visit (INDEPENDENT_AMBULATORY_CARE_PROVIDER_SITE_OTHER): Payer: Medicare HMO | Admitting: Family Medicine

## 2016-04-02 DIAGNOSIS — I10 Essential (primary) hypertension: Secondary | ICD-10-CM

## 2016-04-02 DIAGNOSIS — E1142 Type 2 diabetes mellitus with diabetic polyneuropathy: Secondary | ICD-10-CM | POA: Diagnosis not present

## 2016-04-02 DIAGNOSIS — I7121 Aneurysm of the ascending aorta, without rupture: Secondary | ICD-10-CM

## 2016-04-02 DIAGNOSIS — I712 Thoracic aortic aneurysm, without rupture: Secondary | ICD-10-CM

## 2016-04-02 DIAGNOSIS — F321 Major depressive disorder, single episode, moderate: Secondary | ICD-10-CM | POA: Insufficient documentation

## 2016-04-02 DIAGNOSIS — F320 Major depressive disorder, single episode, mild: Secondary | ICD-10-CM

## 2016-04-02 DIAGNOSIS — E782 Mixed hyperlipidemia: Secondary | ICD-10-CM | POA: Diagnosis not present

## 2016-04-02 MED ORDER — SERTRALINE HCL 50 MG PO TABS: 50.0000 mg | ORAL_TABLET | Freq: Every day | ORAL | 5 refills | Status: DC

## 2016-04-02 MED ORDER — RAMIPRIL 5 MG PO CAPS: 5.0000 mg | ORAL_CAPSULE | Freq: Every day | ORAL | 3 refills | Status: DC

## 2016-04-02 NOTE — Progress Notes (Signed)
Subjective:  Kevin Mcclain is a 71 y.o. year old very pleasant male patient who presents for/with See problem oriented charting ROS- continued pain swallowing, no chest pain. No shortness of breath. No si/hi. Some depressed mood. Admits to anhedonia   Past Medical History-  Patient Active Problem List   Diagnosis Date Noted  . Ascending aortic aneurysm ([place]) 03/25/2016    Priority: High  . G tube feedings ([place]) 06/10/2015    Priority: High  . Cancer of tonsillar fossa ([place]) 10/31/2014    Priority: High  . Osteoarthritis 11/18/2010    Priority: High  . CAD (coronary artery disease) 10/11/2008    Priority: High  . Type 2 diabetes mellitus with peripheral neuropathy ([place]) 01/24/2007    Priority: High  . Gout 03/28/2007    Priority: Medium  . HYPERLIPIDEMIA 01/24/2007    Priority: Medium  . Essential hypertension 01/24/2007    Priority: Medium  . GERD (gastroesophageal reflux disease) 06/10/2015    Priority: Low  . Arthropathy 03/10/2013    Priority: Low  . Low back pain 11/26/2011    Priority: Low  . Kidney stone 11/04/2011    Priority: Low  . Depression ([place]) 04/02/2016  . Diabetic neuropathy ([place]) 12/26/2015  . Herpes zoster 07/15/2015    Medications- reviewed and updated Current Outpatient Prescriptions  Medication Sig Dispense Refill  . aspirin 81 MG tablet Take 81 mg by mouth daily.    Marland Kitchen atorvastatin (LIPITOR) 10 MG tablet Place 1 tablet (10 mg total) into feeding tube daily. 90 tablet 3  . LORazepam (ATIVAN) 0.5 MG tablet Take 1 tablet (0.5 mg total) by mouth every 8 (eight) hours as needed for anxiety. 90 tablet 0  . metFORMIN (GLUCOPHAGE) 500 MG tablet Take 1 tablet (500 mg total) by mouth daily with breakfast. 90 tablet 3  . metoprolol succinate (TOPROL-XL) 25 MG 24 hr tablet Take 25 mg by mouth daily.    . nitroGLYCERIN (NITROSTAT) 0.4 MG SL tablet Place 1 tablet (0.4 mg total) under the tongue every 5 (five) minutes as needed. 50 tablet 11  . Nutritional  Supplements (FEEDING SUPPLEMENT, VITAL 1.5 CAL,) LIQD Begin Vital 1.5 at 20 cc/hr continuously on Friday, 1-24.  If tolerated, increase to 25 cc/hr day 3 and 30 cc/hr on Day 4. Flush with 120 cc free water QID plus and additional 720 cc water by mouth daily.  Please send supplies to home and instruct patient on continuous feeding pump. 1422 mL   . ondansetron (ZOFRAN) 4 MG tablet Take 1 tablet (4 mg total) by mouth every 8 (eight) hours as needed for nausea or vomiting. 30 tablet 5  . OVER THE COUNTER MEDICATION Place 1 drop into both eyes 3 (three) times daily as needed (dry eyes/ irritation). Clear Cooling Eye Drops    . oxyCODONE (ROXICODONE) 5 MG/5ML solution Take 10 mLs (10 mg total) by mouth every 4 (four) hours as needed for severe pain. 1750 mL 0  . pantoprazole (PROTONIX) 40 MG tablet Take 1 tablet (40 mg total) by mouth daily. 30 tablet 5  . polyethylene glycol (MIRALAX / GLYCOLAX) packet Take 17 g by mouth daily as needed.    . prochlorperazine (COMPAZINE) 10 MG tablet Take 1 tablet (10 mg total) by mouth every 6 (six) hours as needed for nausea or vomiting. 90 tablet 4  . sodium fluoride (PREVIDENT 5000 PLUS) 1.1 % CREA dental cream Apply to tooth brush. Brush teeth for 2 minutes. Spit out excess-DO NOT swallow. Repeat nightly. 1 Tube  prn  . ramipril (ALTACE) 5 MG capsule Take 1 capsule (5 mg total) by mouth daily. 90 capsule 3  . sertraline (ZOLOFT) 50 MG tablet Take 1 tablet (50 mg total) by mouth daily. 30 tablet 5   No current facility-administered medications for this visit.     Objective: BP 138/68 (BP Location: Left Arm, Patient Position: Sitting, Cuff Size: Normal)   Pulse (!) 56   Temp 98.1 F (36.7 C) (Other (Comment))   Ht 5\' 4"  (1.626 m)   Wt 124 lb 3.2 oz (56.3 kg)   SpO2 96%   BMI 21.32 kg/m  Gen: NAD, resting comfortably CV: RRR no murmurs rubs or gallops Lungs: CTAB no crackles, wheeze, rhonchi  Ext: no edema Skin: warm, dry  Assessment/Plan:  Type 2  diabetes mellitus with peripheral neuropathy (HCC) S: suspect controlled on metformin once a day. Sugars went into 140s without medicine fasting and preferred to be back on.  Lab Results  Component Value Date   HGBA1C 5.7 12/26/2015   HGBA1C 5.9 09/09/2015   HGBA1C 5.4 04/19/2015   A/P: defer a1c to next visit when update lipids as well. Suspect under 6.5  Essential hypertension S: controlled on metoprolol 25mg  XL on initial check. On my repeat systolic into 0000000. .  BP Readings from Last 3 Encounters:  04/02/16 138/68  02/21/16 (!) 156/61  01/28/16 138/68  A/P:Continue current meds:  But add ramipril 5mg  in due to ascending aortic aneurysm and importance of BP control- hopefully keep under 135 without orthostasis systolic   Ascending aortic aneurysm (HCC) S: discussed aneurysm found on workup for his cancer. 4 cm ascending aorta. Recommendations 1 year repeat CTA or MRA A/P: we opted to repeat in 1 year. If approaches 5.5 or symptomatic would need vascular workup. I noted under HTN goal <135 but per reading on ascending aneurysm may need to target even lower such as 105-120. Can discuss again at follow up.    HYPERLIPIDEMIA Atorvastatin 10mg  only 3 days a week and LDL is 109. Going to try to get to 4 days a week and recheck at follow up. Discussed role of this with aneurysm  Depression ([place]) S: limitations after cancer have really affected his life. He admits that he has had decreased interest in doing things and depressed mood. PHQ9 of 8 A/P: mild depression- offered counseling but he is not interested in this. He would prefer medication trial. Will try very low dose zoloft 50mg  and consider 100mg  at follow up I n6 weeks. Suicide precautions given and follow up advised.   6 weeks  Meds ordered this encounter  Medications  . ramipril (ALTACE) 5 MG capsule    Sig: Take 1 capsule (5 mg total) by mouth daily.    Dispense:  90 capsule    Refill:  3  . sertraline (ZOLOFT) 50 MG  tablet    Sig: Take 1 tablet (50 mg total) by mouth daily.    Dispense:  30 tablet    Refill:  5    Return precautions advised.  Garret Reddish, MD

## 2016-04-02 NOTE — Assessment & Plan Note (Signed)
Atorvastatin 10mg  only 3 days a week and LDL is 109. Going to try to get to 4 days a week and recheck at follow up. Discussed role of this with aneurysm

## 2016-04-02 NOTE — Assessment & Plan Note (Signed)
S: suspect controlled on metformin once a day. Sugars went into 140s without medicine fasting and preferred to be back on.  Lab Results  Component Value Date   HGBA1C 5.7 12/26/2015   HGBA1C 5.9 09/09/2015   HGBA1C 5.4 04/19/2015   A/P: defer a1c to next visit when update lipids as well. Suspect under 6.5

## 2016-04-02 NOTE — Assessment & Plan Note (Signed)
S: limitations after cancer have really affected his life. He admits that he has had decreased interest in doing things and depressed mood. PHQ9 of 8 A/P: mild depression- offered counseling but he is not interested in this. He would prefer medication trial. Will try very low dose zoloft 50mg  and consider 100mg  at follow up I n6 weeks. Suicide precautions given and follow up advised.

## 2016-04-02 NOTE — Assessment & Plan Note (Signed)
S: controlled on metoprolol 25mg  XL on initial check. On my repeat systolic into 0000000. .  BP Readings from Last 3 Encounters:  04/02/16 138/68  02/21/16 (!) 156/61  01/28/16 138/68  A/P:Continue current meds:  But add ramipril 5mg  in due to ascending aortic aneurysm and importance of BP control- hopefully keep under 135 without orthostasis systolic

## 2016-04-02 NOTE — Progress Notes (Signed)
Pre visit review using our clinic review tool, if applicable. No additional management support is needed unless otherwise documented below in the visit note. 

## 2016-04-02 NOTE — Assessment & Plan Note (Signed)
S: discussed aneurysm found on workup for his cancer. 4 cm ascending aorta. Recommendations 1 year repeat CTA or MRA A/P: we opted to repeat in 1 year. If approaches 5.5 or symptomatic would need vascular workup. I noted under HTN goal <135 but per reading on ascending aneurysm may need to target even lower such as 105-120. Can discuss again at follow up.

## 2016-04-02 NOTE — Patient Instructions (Addendum)
Let's add ramipril 5mg  back in  Trial low dose zoloft  Also try to do 4x a week cholesterol medicine. Recheck next visit.   Have you had your eye exam? Have to get this updated   Check in 6 weeks from now. Will update labs then.    Taking the medicine as directed and not missing any doses is one of the best things you can do to treat your depression.  Here are some things to keep in mind:  1) Side effects (stomach upset, some increased anxiety) may happen before you notice a benefit.  These side effects typically go away over time. 2) Changes to your dose of medicine or a change in medication all together is sometimes necessary 3) Most people need to be on medication at least 6-12 months 4) Many people will notice an improvement within two weeks but the full effect of the medication can take up to 4-6 weeks 5) Stopping the medication when you start feeling better often results in a return of symptoms 6) If you start having thoughts of hurting yourself or others after starting this medicine, call our office immediately at [phone] or seek care through 911.

## 2016-04-21 ENCOUNTER — Other Ambulatory Visit (HOSPITAL_COMMUNITY): Payer: Self-pay | Admitting: Adult Health

## 2016-04-21 ENCOUNTER — Telehealth (HOSPITAL_COMMUNITY): Payer: Self-pay | Admitting: *Deleted

## 2016-04-21 ENCOUNTER — Telehealth (HOSPITAL_COMMUNITY): Payer: Self-pay | Admitting: Adult Health

## 2016-04-21 DIAGNOSIS — C099 Malignant neoplasm of tonsil, unspecified: Secondary | ICD-10-CM

## 2016-04-21 MED ORDER — OXYCODONE HCL 5 MG/5ML PO SOLN: 10.0000 mg | ORAL | 0 refills | Status: DC | PRN

## 2016-04-21 MED FILL — oxyCODONE HCL 5 MG/5ML SOLN: 5 | 22 days supply | Qty: 2000 | Fill #0

## 2016-04-21 NOTE — Telephone Encounter (Signed)
Received message that patient had called trying to reach me.   Returned Kevin Mcclain call. He is requesting a refill of his oxycodone solution. He tells me that he saw Dr. 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  He has been using the oxycodone every 4 hours around-the-clock. Recommended we resume Fentanyl patch for more optimal pain control, but he declined.    Patient also with questions regarding having his feeding tube removed. Advised him that his weight will need to remain stable without tube feeding supplementation for a period of months. Also, Kevin Mcclain, SLP (patient's speech therapist) will need to determine that patient's swallow is safe and appropriate for the tube to be removed. Encouraged him to keep a log of his weights and push oral intake; if he notices his weight trending down, then he will need to resume tube feedings.   Oxycodone solution refilled with the following directions:  Oxycodone solution 5 mg/5 mL, take 10-15 mL Q4Hprn, #2057mL; no refills.    [place] Controlled Substance Registry reviewed and prescription refill is appropriate. Continued chronic pain management for patient's history of tonsil cancer is necessary at this time.      Prescription left for patient at front desk at [facility] for him to sign for with photo ID.  I will work on getting him in to be seen sometime within the next 3 months.    [name], NP [place] [phone]

## 2016-04-21 NOTE — Telephone Encounter (Signed)
Returned pt call; see separate phone encounter.   [name], NP [place] [phone]

## 2016-05-12 ENCOUNTER — Other Ambulatory Visit (HOSPITAL_COMMUNITY): Payer: Self-pay | Admitting: Adult Health

## 2016-05-12 ENCOUNTER — Encounter (HOSPITAL_COMMUNITY): Payer: Self-pay | Admitting: Adult Health

## 2016-05-12 DIAGNOSIS — C099 Malignant neoplasm of tonsil, unspecified: Secondary | ICD-10-CM

## 2016-05-12 MED ORDER — OXYCODONE HCL 5 MG/5ML PO SOLN: 10.0000 mg | ORAL | 0 refills | Status: DC | PRN

## 2016-05-12 MED FILL — oxyCODONE HCL 5 MG/5ML SOLN: 5 | 22 days supply | Qty: 2000 | Fill #0

## 2016-05-12 NOTE — Progress Notes (Signed)
Patient called [facility] requesting refill of oxycodone.   [place] Controlled Substance Registry reviewed and refill is appropriate at this time.    Prescription refilled and printed; he can come to clinic to pick up prescription after showing photo ID per clinic policy.  He has follow-up scheduled with me in 05/2016.    [name], NP [place] [phone]

## 2016-06-01 ENCOUNTER — Telehealth (HOSPITAL_COMMUNITY): Payer: Self-pay | Admitting: *Deleted

## 2016-06-02 ENCOUNTER — Other Ambulatory Visit (HOSPITAL_COMMUNITY): Payer: Self-pay | Admitting: Adult Health

## 2016-06-02 ENCOUNTER — Encounter (HOSPITAL_COMMUNITY): Payer: Self-pay | Admitting: Adult Health

## 2016-06-02 DIAGNOSIS — C099 Malignant neoplasm of tonsil, unspecified: Secondary | ICD-10-CM

## 2016-06-02 MED ORDER — OXYCODONE HCL 5 MG/5ML PO SOLN: 10.0000 mg | ORAL | 0 refills | Status: DC | PRN

## 2016-06-02 MED FILL — oxyCODONE HCL 5 MG/5ML SOLN: 5 | 22 days supply | Qty: 2000 | Fill #0

## 2016-06-02 NOTE — Telephone Encounter (Signed)
Prescription printed and placed in folder for patient pick-up.   [name], NP [place] [phone]

## 2016-06-02 NOTE — Progress Notes (Signed)
Patient called office requesting Oxycodone refill. [place] Controlled Substance Registry reviewed; authorized refill on 06/03/16 or after based on registry data.  Paper prescription printed and left at cancer center front desk for patient to retrieve at his convenience.    [name], NP [place] [phone]

## 2016-06-12 ENCOUNTER — Other Ambulatory Visit: Payer: Self-pay

## 2016-06-12 MED ORDER — SERTRALINE HCL 50 MG PO TABS: 50.0000 mg | ORAL_TABLET | Freq: Every day | ORAL | 1 refills | Status: DC

## 2016-06-22 ENCOUNTER — Ambulatory Visit (HOSPITAL_COMMUNITY): Payer: Self-pay | Admitting: Oncology

## 2016-06-22 NOTE — Progress Notes (Signed)
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      10/12/2014 Pathology Results    Accession: [id number]  right tonsil biopsy confirmed squamous cell carcinoma, HPV positive      10/12/2014 Procedure     he underwent biopsy of right tonsil      10/29/2014 Imaging     PET/CT scan showed 3.2 cm hypermetabolic mass in the right palatine tonsil, consistent with known tonsillar carcinoma.       11/06/2014 Imaging    CT neck: Right palatine tonsil mass measuring 2.1 x 2.1 x 2.5 cm.  Asymmetric right LNs in level 1B, 2, and level 3.       12/03/2014 - 01/18/2015 Radiation Therapy    Right tonsil and bilateral neck / 70 Gy in 35 fractions to gross disease, 63 Gy in 35 fractions to high risk nodal echelons, and 56 Gy in 35 fractions to intermediate risk nodal echelons      01/06/2015 Miscellaneous    ER VISIT for dysphagia.  No hospital admission. CT neck done and pt was able to tolerate po liquids afer IV zofran. Discharged home.       01/06/2015 Imaging    CT neck: IMPRESSION: Significant interval treatment response with no residual measurable right tonsil mass. Mild mucosal thickening and hyperenhancement in bilateral oropharynx & epiglottis, likely tx effect. No cervical adenpathy.      04/25/2015 Procedure    PEG placement       05/16/2015 PET scan    Restaging PET scan: Interval response to therapy. Resolution  of hypermetabolism associated with right tonsillar lesion. No evidence of residual or recurrence of tumor or metastatic disease.       03/23/2016 Imaging    CT neck for persistent pain: IMPRESSION: Acute RIGHT submandibular sialoadenitis, RIGHT neck inflammation and pharyngitis. Findings may be secondary to radiation or, infectious.  No convincing evidence of residual nor recurrent tumor.      03/23/2016 Imaging    CT chest/abd for cough/N&V: IMPRESSION: 1. No findings of metastatic disease to the chest or upper abdomen. 2. 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 Recommend annual imaging followup by CTA or MRA. This recommendation follows 2010 ACCF/AHA/AATS/ACR/ASA/SCA/SCAI/SIR/STS/SVM Guidelines for the Diagnosis and Management of Patients with Thoracic Aortic Disease. Circulation. 2010; 121: [id number] 3. Other imaging findings of potential clinical significance: Pneumobilia, query prior sphincterotomy. Coronary, aortic arch, and branch vessel atherosclerotic vascular disease. Aortoiliac atherosclerotic vascular disease. Foraminal impingement at L4-5 and L5-S1 due to spondylosis and degenerative disc disease. Bilateral renal cysts.        INTERVAL HISTORY:  Kevin Mcclain 71 y.o. male presents to [facility] after transferring his medical oncology care here since this provider's recent relocation to this office.  (He is my previous patient treated in [place] at [place]).  He is here for routine follow-up for h/o  Stage II right tonsil cancer.   Overall, he is doing well with the exception of continued mouth, throat, right ear, and neck pain. These have been chronic issues for Kevin Mcclain since completing radiation therapy in 01/2015.  Endorses feeling frustrated that he is always in pain. He feels like he may be ready to restart long-acting pain medications again (previously on Fentanyl patches during treatment and have been offered since that time, but  he declined).  With regards to the pain, reports feeling hopeless at times. Denies any suicidal ideation, but does feel depressed.   Last visit with ENT, Dr. Erik Obey, in 05/6268. Has not seen Dr. Isidore Moos with Radiation Oncology since 11/2015.  He tells me that he is scheduled to see his PCP, Dr. Yong Channel, next week for routine follow-up with lab work.    Appetite is slightly improved; "I'm trying my best to eat more." His weight is actually up about 3 lbs since his last visit with me in 01/2016. He has not used the PEG for tube feedings since 01/2016.    Endorses cold intolerance and fatigue. Bowels are moving regularly with stool softeners/laxatives.     REVIEW OF SYSTEMS:  Review of Systems  Constitutional: Positive for fatigue. Negative for chills and fever.  HENT:   Positive for sore throat. Negative for lump/mass and trouble swallowing.        Chronic dry mouth  Eyes: Negative.   Respiratory: Negative.   Cardiovascular: Negative.   Gastrointestinal: Negative.  Negative for abdominal pain, blood in stool, constipation, diarrhea, nausea and vomiting.  Endocrine: Negative.        Cold intolerance  Genitourinary: Negative.    Musculoskeletal: Negative.  Negative for arthralgias.  Skin: Negative.  Negative for rash.  Neurological:       Burning pain at times in throat, neck, right jaw, and right ear  Hematological: Negative.  Negative for adenopathy. Does not bruise/bleed easily.  Psychiatric/Behavioral: Positive for depression and sleep disturbance. Negative for suicidal ideas.     PAST MEDICAL/SURGICAL HISTORY:  Past Medical History:  Diagnosis Date  . Allergy   . Arthritis   . CAD (coronary artery disease)    severe CAD-s/p MI 2000 with PTCA, s/p CABG 2005-by Dr Vanetta Mulders at [facility] EF 50-55%  . Cancer ([place]) 10/12/2014   right tonsil=squamous cell  carcinaoma  . Diabetes mellitus type II   . GERD (gastroesophageal reflux disease)    zantac- prn  . Gout   . Hyperlipidemia    . Hypertension   . Kidney stones   . Shingles outbreak 04/23/15   left shoulder and left arm  . Vertigo    Past Surgical History:  Procedure Laterality Date  . CHOLECYSTECTOMY  2012  . COLONOSCOPY W/ POLYPECTOMY  2015  . CORONARY ARTERY BYPASS GRAFT  2005   6 grafts  . CYSTOSCOPY     kidney stone removal  . LITHOTRIPSY    . MULTIPLE EXTRACTIONS WITH ALVEOLOPLASTY N/A 11/14/2014   Procedure: Extraction of tooth #2 and 31 with alveoloplasty after sectioning of bridge at distal #29 with full mouth debridement of remaining dention;  Surgeon: Lenn Cal, DDS;  Location: [facility];  Service: Oral Surgery;  Laterality: N/A;  . NASAL SINUS SURGERY Right 11/14/2014   Procedure: RIGHT ENDOSCOPIC ANTROSTOMY AND ANTERIOR ETHMOIDECTOMY ;  Surgeon: Jodi Marble, MD;  Location: [facility];  Service: ENT;  Laterality: Right;     SOCIAL HISTORY:  Social History   Social History  . Marital  status: Married    Spouse name: N/A  . Number of children: N/A  . Years of education: N/A   Occupational History  . Not on file.   Social History Main Topics  . Smoking status: Never Smoker  . Smokeless tobacco: Never Used  . Alcohol use No  . Drug use: No  . Sexual activity: Not on file   Other Topics Concern  . Not on file   Social History Narrative   Married 35 years in October 2017.       Occupation :  Retired Arboriculturist for [employer].  Worked for [employer]: garden and work in yard    FAMILY HISTORY:  Family History  Problem Relation Age of Onset  . Heart attack Mother     deceased age 68 secondary  . Colon cancer Father   . Stroke Father     deceased age 22  . Heart disease Father     heart attack  . Stroke Sister 61  . Coronary artery disease Brother 75    s/p bypass surgery   . Coronary artery disease Brother 56    stent    CURRENT MEDICATIONS:  Outpatient Encounter Prescriptions as of 06/23/2016  Medication Sig  . aspirin 81 MG tablet Take 81  mg by mouth daily.  Marland Kitchen atorvastatin (LIPITOR) 10 MG tablet Place 1 tablet (10 mg total) into feeding tube daily.  Marland Kitchen LORazepam (ATIVAN) 0.5 MG tablet Take 1 tablet (0.5 mg total) by mouth every 8 (eight) hours as needed for anxiety.  . metFORMIN (GLUCOPHAGE) 500 MG tablet Take 1 tablet (500 mg total) by mouth daily with breakfast.  . metoprolol succinate (TOPROL-XL) 25 MG 24 hr tablet Take 25 mg by mouth daily.  . Nutritional Supplements (FEEDING SUPPLEMENT, VITAL 1.5 CAL,) LIQD Begin Vital 1.5 at 20 cc/hr continuously on Friday, 1-24.  If tolerated, increase to 25 cc/hr day 3 and 30 cc/hr on Day 4. Flush with 120 cc free water QID plus and additional 720 cc water by mouth daily.  Please send supplies to home and instruct patient on continuous feeding pump.  Marland Kitchen ondansetron (ZOFRAN) 4 MG tablet Take 1 tablet (4 mg total) by mouth every 8 (eight) hours as needed for nausea or vomiting.  Marland Kitchen OVER THE COUNTER MEDICATION Place 1 drop into both eyes 3 (three) times daily as needed (dry eyes/ irritation). Clear Cooling Eye Drops  . oxyCODONE (ROXICODONE) 5 MG/5ML solution Take 10-15 mLs (10-15 mg total) by mouth every 4 (four) hours as needed for severe pain.  . pantoprazole (PROTONIX) 40 MG tablet Take 1 tablet (40 mg total) by mouth daily.  . polyethylene glycol (MIRALAX / GLYCOLAX) packet Take 17 g by mouth daily as needed.  . prochlorperazine (COMPAZINE) 10 MG tablet Take 1 tablet (10 mg total) by mouth every 6 (six) hours as needed for nausea or vomiting.  . ramipril (ALTACE) 5 MG capsule Take 1 capsule (5 mg total) by mouth daily.  . sertraline (ZOLOFT) 50 MG tablet Take 1 tablet (50 mg total) by mouth daily.  . sodium fluoride (PREVIDENT 5000 PLUS) 1.1 % CREA dental cream Apply to tooth brush. Brush teeth for 2 minutes. Spit out excess-DO NOT swallow. Repeat nightly.  . [DISCONTINUED] oxyCODONE (OXY IR/ROXICODONE) 5 MG immediate release tablet   . [DISCONTINUED] oxyCODONE (ROXICODONE) 5 MG/5ML solution  Take 10-15 mLs (10-15 mg total) by mouth every 4 (four) hours as needed for severe pain.  . [  DISCONTINUED] oxyCODONE (ROXICODONE) 5 MG/5ML solution Take 10-15 mLs (10-15 mg total) by mouth every 4 (four) hours as needed for severe pain.  . fentaNYL (DURAGESIC - DOSED MCG/HR) 50 MCG/HR Place 1 patch (50 mcg total) onto the skin every 3 (three) days.  . nitroGLYCERIN (NITROSTAT) 0.4 MG SL tablet Place 1 tablet (0.4 mg total) under the tongue every 5 (five) minutes as needed.  . [DISCONTINUED] fentaNYL (DURAGESIC - DOSED MCG/HR) 25 MCG/HR patch Place 1 patch (25 mcg total) onto the skin every 3 (three) days.  . [DISCONTINUED] fentaNYL (DURAGESIC - DOSED MCG/HR) 50 MCG/HR Place 1 patch (50 mcg total) onto the skin every 3 (three) days.   No facility-administered encounter medications on file as of 06/23/2016.     ALLERGIES:  Allergies  Allergen Reactions  . Benzonatate Swelling    "swells my throat"  . Codeine Itching  . Sitagliptin Phosphate Itching     PHYSICAL EXAM:  ECOG Performance status: 1 - Symptomatic, but independent  Vitals:   06/23/16 1419  BP: (!) 140/53  Pulse: (!) 54  Resp: 16  Temp: 98.5 F (36.9 C)   Filed Weights   06/23/16 1419  Weight: 125 lb (56.7 kg)    Physical Exam  Constitutional: He is oriented to person, place, and time.  Thin male in no acute distress  HENT:  Head: Normocephalic.  Mouth/Throat: Oropharynx is clear and moist.  Posterior oropharynx with mild erythema and telangectasia consistent with radiation changes. No apparent mass  Eyes: Conjunctivae are normal. Pupils are equal, round, and reactive to light. No scleral icterus.  Neck: Normal range of motion.  Anterior neck fibrosis and mild/moderate lymphedema   Cardiovascular: Regular rhythm and normal heart sounds.   Bradycardia  Pulmonary/Chest: Effort normal and breath sounds normal. No respiratory distress. He has no wheezes.  Abdominal: Soft. Bowel sounds are normal. There is no  tenderness. There is no rebound and no guarding.  G-tube in place  Musculoskeletal: Normal range of motion. He exhibits no edema.  Lymphadenopathy:    He has no cervical adenopathy.  Neurological: He is alert and oriented to person, place, and time. No cranial nerve deficit. Gait normal.  Skin: Skin is warm and dry. No rash noted.  Psychiatric: Mood, memory, affect and judgment normal.  Nursing note and vitals reviewed.    LABORATORY DATA:  I have reviewed the labs as listed.  CBC    Component Value Date/Time   WBC 8.2 09/09/2015 1134   RBC 4.57 09/09/2015 1134   HGB 14.0 09/09/2015 1134   HGB 13.3 04/23/2015 1102   HCT 41.3 09/09/2015 1134   HCT 37.6 (L) 04/23/2015 1102   PLT 215.0 09/09/2015 1134   PLT 163 04/23/2015 1102   MCV 90.2 09/09/2015 1134   MCV 89.3 04/23/2015 1102   MCH 31.1 04/25/2015 1138   MCHC 34.0 09/09/2015 1134   RDW 13.8 09/09/2015 1134   RDW 13.2 04/23/2015 1102   LYMPHSABS 0.4 (L) 04/23/2015 1102   MONOABS 0.6 04/23/2015 1102   EOSABS 0.1 04/23/2015 1102   BASOSABS 0.0 04/23/2015 1102   CMP Latest Ref Rng & Units 03/23/2016 09/09/2015 05/16/2015  Glucose 70 - 99 mg/dL - 162(H) 116  BUN 6 - 23 mg/dL - 17 17.3  Creatinine 0.61 - 1.24 mg/dL 0.80 0.68 0.8  Sodium 135 - 145 mEq/L - 137 141  Potassium 3.5 - 5.1 mEq/L - 4.7 4.5  Chloride 96 - 112 mEq/L - 99 -  CO2 19 - 32 mEq/L -  32 31(H)  Calcium 8.4 - 10.5 mg/dL - 9.9 9.8  Total Protein 6.0 - 8.3 g/dL - 6.6 6.9  Total Bilirubin 0.2 - 1.2 mg/dL - 0.8 0.61  Alkaline Phos 39 - 117 U/L - 89 87  AST 0 - 37 U/L - 21 34  ALT 0 - 53 U/L - 26 49    PENDING LABS:    DIAGNOSTIC IMAGING:  CT neck: 03/23/16     PATHOLOGY:  (R) tonsil biopsy: 10/12/14     ASSESSMENT & PLAN:   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 s/p definitive treatment with radiation alone; completed treatment on 01/18/15.  -Reviewed that highest risk of recurrence is in the first 2 years  after diagnosis/treatment. Currently there is no clinical evidence of recurrent disease, which is reassuring.  -He hasn't seen Dr. Isidore Moos since 11/2015; I will make arrangements for him to return to see her in 08/2016. He will see Dr. Erik Obey again in 0/1779 per patient.  -Return to cancer center in 11/2016 for continued surveillance here at [facility]. At that time, we may be able to extend visits to every 6 months, with alternating visits with ENT.   At risk for hypothyroidism:  -Does report cold intolerance and fatigue. Last TSH in 03/2015 was normal at 1.878.  -Of course, patients treated with radiation therapy for H&N cancer are at increased risk of developing hypothyroidism over time d/t thyroid gland exposure to radiation.  Recommended we check TSH today. However, since patient is scheduled to see his PCP next week, will ask patient to see if TSH can be added on to labs and the results faxed to our office.   -Patient to discuss with PCP to obtain TSH, in lieu of collecting labs today.   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  -Current pain regimen with Oxycodone alone not ideal.  Previously used Fentanyl patches (tolerated well) and concomitant gabapentin (not tolerated as well) during acute recovery from radiation therapy.  We also attempted oxycodone tablets at one point, but he was unable to tolerate d/t N&V. -Discussed resuming Fentanyl patch for basal pain medication coverage. Reviewed the role and possible side effects of Fentanyl, as well as how to administer.  He agreed to resume Fentanyl patches. Morphine-equivalent dosing for current daily oxycodone use = 135 mg. Will start 50 mcg Fentanyl patch given possible cross-tolerance/reactivity between opioids.  -Goal will be for current oxycodone prescription to be month's supply with addition of long-acting pain medication.    -[place] Controlled Substance Reporting System reviewed.  -The  following paper prescriptions given to patient today:   *Fentanyl 50 mcg patch TD every 3 days, #10, no refills.   *Oxycodone 5 mg/5 mL, take 10-15 mL Q4Hprn, #2000 mL, no refills. May fill on 06/23/16 or after.   *Oxycodone 5 mg/5 mL, take 10-15 mL Q4Hprn, #2000 mL, no refills. May fill on 07/23/16 or after.     Depression and Peripheral neuropathy:  -Discussed adding Cymbalta to his medication regimen to optimize both his mood and his peripheral neuropathy. Clinically, he does appear depressed, which could be due to chronic pain.  -Discussed that since his peripheral neuropathy is not secondary to cancer treatments and likely d/t diabetes, I would prefer his PCP prescribe and manage this medication, as clinically indicated. I certainly think it would be reasonable to switch from his current Zoloft to Cymbalta for better mood optimization, as well as some neuropathic  pain benefit as well. Mr. Guild will talk to his PCP next week.  -Providing emotional support and reassurance today.   Improved protein-calorie malnutrition:  -Has been able to gain weight without use of feeding tube for the past 4 months. No dysphagia symptoms.  -It is appropriate to have PEG tube removed at this point, which is terrific.  Orders placed to have G-tube removed with IR team in [place] (as they placed the tube).     Dispo:  -PEG removed as soon as able; orders placed today.  -See Dr. Isidore Moos in 08/2016 in [place].  -Return to cancer center in 11/2016 for continued surveillance.    All questions were answered to patient's stated satisfaction. Encouraged patient to call with any new concerns or questions before his next visit to the cancer center and we can certain see him sooner, if needed.    Plan of care discussed with Dr. Talbert Cage, who agrees with the above aforementioned.    Orders placed this encounter:  Orders Placed This Encounter  Procedures  . IR GASTROSTOMY TUBE REMOVAL      [name], [place] [phone]

## 2016-06-23 ENCOUNTER — Encounter (HOSPITAL_COMMUNITY): Payer: Medicare HMO | Attending: Adult Health | Admitting: Adult Health

## 2016-06-23 ENCOUNTER — Encounter (HOSPITAL_COMMUNITY): Payer: Self-pay | Admitting: Adult Health

## 2016-06-23 VITALS — BP 140/53 | HR 54 | Temp 98.5°F | Resp 16 | Ht 64.0 in | Wt 125.0 lb

## 2016-06-23 DIAGNOSIS — C099 Malignant neoplasm of tonsil, unspecified: Secondary | ICD-10-CM

## 2016-06-23 DIAGNOSIS — R070 Pain in throat: Secondary | ICD-10-CM

## 2016-06-23 DIAGNOSIS — E46 Unspecified protein-calorie malnutrition: Secondary | ICD-10-CM | POA: Diagnosis not present

## 2016-06-23 DIAGNOSIS — G8929 Other chronic pain: Secondary | ICD-10-CM | POA: Diagnosis not present

## 2016-06-23 DIAGNOSIS — R69 Illness, unspecified: Secondary | ICD-10-CM | POA: Diagnosis not present

## 2016-06-23 DIAGNOSIS — F329 Major depressive disorder, single episode, unspecified: Secondary | ICD-10-CM | POA: Diagnosis not present

## 2016-06-23 DIAGNOSIS — B977 Papillomavirus as the cause of diseases classified elsewhere: Secondary | ICD-10-CM

## 2016-06-23 DIAGNOSIS — G620 Drug-induced polyneuropathy: Secondary | ICD-10-CM | POA: Diagnosis not present

## 2016-06-23 MED ORDER — FENTANYL 50 MCG/HR TD PT72: 50.0000 ug | MEDICATED_PATCH | TRANSDERMAL | 0 refills | Status: DC

## 2016-06-23 MED ORDER — OXYCODONE HCL 5 MG/5ML PO SOLN: 10.0000 mg | ORAL | 0 refills | Status: DC | PRN

## 2016-06-23 MED ORDER — FENTANYL 25 MCG/HR TD PT72: 25.0000 ug | MEDICATED_PATCH | TRANSDERMAL | 0 refills | Status: DC

## 2016-06-23 MED FILL — oxyCODONE HCL 5 MG/5ML SOLN: 5 | 23 days supply | Qty: 2000 | Fill #0

## 2016-06-23 MED FILL — fentaNYL 50 MCG/HR PT72: 50 | 30 days supply | Qty: 10 | Fill #0

## 2016-06-23 NOTE — Patient Instructions (Addendum)
[place] at [facility] Discharge Instructions  RECOMMENDATIONS MADE BY THE CONSULTANT AND ANY TEST RESULTS WILL BE SENT TO YOUR REFERRING PHYSICIAN.  You were seen today by [name] NP. Refill given on pain medications. Restart Fentanyl patches. PEG tube to be removed in [place]. To see Dr.Squire in July. Return in 6 months for follow up.   Thank you for choosing [place] at [facility] to provide your oncology and hematology care.  To afford each patient quality time with our provider, please arrive at least 15 minutes before your scheduled appointment time.    If you have a lab appointment with the [facility] please come in thru the  Main Entrance and check in at the main information desk  You need to re-schedule your appointment should you arrive 10 or more minutes late.  We strive to give you quality time with our providers, and arriving late affects you and other patients whose appointments are after yours.  Also, if you no show three or more times for appointments you may be dismissed from the clinic at the providers discretion.     Again, thank you for choosing [facility].  Our hope is that these requests will decrease the amount of time that you wait before being seen by our physicians.       _____________________________________________________________  Should you have questions after your visit to [facility], please contact our office at [phone] between the hours of 8:30 a.m. and 4:30 p.m.  Voicemails left after 4:30 p.m. will not be returned until the following business day.  For prescription refill requests, have your pharmacy contact our office.       Resources For Cancer Patients and their Caregivers ? American Cancer Society: Can assist with transportation, wigs, general needs, runs Look Good Feel Better.        509 730 6529 ? Cancer Care: Provides financial assistance,  online support groups, medication/co-pay assistance.  1-800-813-HOPE 585-301-0247) ? [facility] Assists [place] cancer patients and their families through emotional , educational and financial support.  (281)421-7914 ? [place] DSS Where to apply for food stamps, Medicaid and utility assistance. 463-502-4146 ? [facility]: Transportation to medical appointments. (218)768-6829 ? Social Security Administration: May apply for disability if have a Stage IV cancer. (979) 335-8789 (315)041-0493 ? [employer], Disability and Transit Services: Assists with nutrition, care and transit needs. [place] Support Programs: @10RELATIVEDAYS @ > Cancer Support Group  2nd Tuesday of the month 1pm-2pm, Journey Room  > Creative Journey  3rd Tuesday of the month 1130am-1pm, Journey Room  > Look Good Feel Better  1st Wednesday of the month 10am-12 noon, Journey Room (Call [place] to register [phone])

## 2016-06-25 ENCOUNTER — Ambulatory Visit (HOSPITAL_COMMUNITY)
Admission: RE | Admit: 2016-06-25 | Discharge: 2016-06-25 | Disposition: A | Discharge status: Home / Self Care | Payer: Medicare HMO | Source: Ambulatory Visit | Attending: Adult Health | Admitting: Adult Health

## 2016-06-25 ENCOUNTER — Encounter (HOSPITAL_COMMUNITY): Payer: Self-pay | Admitting: Interventional Radiology

## 2016-06-25 DIAGNOSIS — Z431 Encounter for attention to gastrostomy: Secondary | ICD-10-CM | POA: Insufficient documentation

## 2016-06-25 DIAGNOSIS — C099 Malignant neoplasm of tonsil, unspecified: Secondary | ICD-10-CM | POA: Diagnosis not present

## 2016-06-25 DIAGNOSIS — Z4589 Encounter for adjustment and management of other implanted devices: Secondary | ICD-10-CM | POA: Diagnosis not present

## 2016-06-25 HISTORY — PX: IR GASTROSTOMY TUBE REMOVAL: IMG5492

## 2016-06-25 MED ORDER — LIDOCAINE VISCOUS 2 % MT SOLN
OROMUCOSAL | Status: AC
  Filled 2016-06-25: qty 15

## 2016-06-25 MED ORDER — LIDOCAINE VISCOUS 2 % MT SOLN
OROMUCOSAL | Status: AC | PRN
  Administered 2016-06-25: 15 mL via OROMUCOSAL

## 2016-06-25 NOTE — Procedures (Signed)
Successful removal of pull through gastrostomy. No complications.  Ascencion Dike PA-C Interventional Radiology 06/25/2016 9:11 AM

## 2016-06-29 ENCOUNTER — Encounter: Payer: Self-pay | Admitting: Family Medicine

## 2016-06-29 ENCOUNTER — Ambulatory Visit (INDEPENDENT_AMBULATORY_CARE_PROVIDER_SITE_OTHER): Payer: Medicare HMO | Admitting: Family Medicine

## 2016-06-29 DIAGNOSIS — F320 Major depressive disorder, single episode, mild: Secondary | ICD-10-CM

## 2016-06-29 DIAGNOSIS — E782 Mixed hyperlipidemia: Secondary | ICD-10-CM | POA: Diagnosis not present

## 2016-06-29 DIAGNOSIS — E1142 Type 2 diabetes mellitus with diabetic polyneuropathy: Secondary | ICD-10-CM | POA: Diagnosis not present

## 2016-06-29 DIAGNOSIS — I10 Essential (primary) hypertension: Secondary | ICD-10-CM

## 2016-06-29 DIAGNOSIS — R69 Illness, unspecified: Secondary | ICD-10-CM | POA: Diagnosis not present

## 2016-06-29 LAB — CBC
HEMATOCRIT: 39.7 % (ref 39.0–52.0)
Hemoglobin: 13.6 g/dL (ref 13.0–17.0)
MCHC: 34.4 g/dL (ref 30.0–36.0)
MCV: 88.2 fl (ref 78.0–100.0)
Platelets: 218 10*3/uL (ref 150.0–400.0)
RBC: 4.5 Mil/uL (ref 4.22–5.81)
RDW: 14.3 % (ref 11.5–15.5)
WBC: 5.9 10*3/uL (ref 4.0–10.5)

## 2016-06-29 LAB — COMPREHENSIVE METABOLIC PANEL
ALBUMIN: 4.1 g/dL (ref 3.5–5.2)
ALT: 18 U/L (ref 0–53)
AST: 21 U/L (ref 0–37)
Alkaline Phosphatase: 73 U/L (ref 39–117)
BUN: 10 mg/dL (ref 6–23)
CHLORIDE: 102 meq/L (ref 96–112)
CO2: 31 meq/L (ref 19–32)
Calcium: 9.4 mg/dL (ref 8.4–10.5)
Creatinine, Ser: 0.82 mg/dL (ref 0.40–1.50)
GFR: 97.91 mL/min (ref >60.00)
Glucose, Bld: 88 mg/dL (ref 70–99)
POTASSIUM: 4 meq/L (ref 3.5–5.1)
SODIUM: 139 meq/L (ref 135–145)
Total Bilirubin: 0.6 mg/dL (ref 0.2–1.2)
Total Protein: 6.4 g/dL (ref 6.0–8.3)

## 2016-06-29 LAB — HEMOGLOBIN A1C: Hgb A1c MFr Bld: 5.2 % (ref 4.6–6.5)

## 2016-06-29 LAB — LDL CHOLESTEROL, DIRECT: LDL DIRECT: 100 mg/dL

## 2016-06-29 LAB — TSH

## 2016-06-29 MED ORDER — DULOXETINE HCL 30 MG PO CPEP: 30.0000 mg | ORAL_CAPSULE | Freq: Every day | ORAL | 5 refills | Status: DC

## 2016-06-29 NOTE — Assessment & Plan Note (Signed)
S: last visit started on zoloft 50mg . No SI . Symptoms improved but hoping for more pain control from medicine with long term neuropathy A/P: per avs "Once you start cymbalta. For first 3 days take half tablet of zoloft then stop the zoloft completely. Follow 2 months"

## 2016-06-29 NOTE — Assessment & Plan Note (Signed)
S: previously well controlled. On metformin once a day. Off meds cbgs into 140s so prefers to be on.  Lab Results  Component Value Date   HGBA1C 5.7 12/26/2015   HGBA1C 5.9 09/09/2015   HGBA1C 5.4 04/19/2015   A/P: update a1c today

## 2016-06-29 NOTE — Progress Notes (Signed)
Pre visit review using our clinic review tool, if applicable. No additional management support is needed unless otherwise documented below in the visit note. 

## 2016-06-29 NOTE — Patient Instructions (Addendum)
Once you start cymbalta. For first 3 days take half tablet of zoloft then stop the zoloft completely. Follow 2 months  Let us know immediately if any thoughts of hurting yourself  See if you can tolerate the 4x a week cholesterol medicine now. Referred you back to cardiology to discuss injectable option if this doesn't work  If you have had your eye exam within the last year, please sign release of information at check out desk. If you have not had an eye exam within a year, please get one at this time as this is important for your diabetes care  I would also like for you to sign up for an annual wellness visit with our nurse, Manuela Schwartz, who specializes in the annual wellness exam. This is a free benefit under medicare that may help [place] find additional ways to help you. Some highlights are reviewing medications, lifestyle, and doing a dementia screen.   Please stop by lab before you go

## 2016-06-29 NOTE — Assessment & Plan Note (Addendum)
S: tried to titrate atorvastatin 10mg  to 4 days a week up from 3 but didn't tolerate. Abdominal pain with statin.   Lab Results  Component Value Date   CHOL 153 09/09/2015   HDL 55.50 09/09/2015   LDLCALC 53 12/04/2013   LDLDIRECT 109.0 12/26/2015   TRIG 58.0 09/09/2015   CHOLHDL 3 12/04/2013   A/P: wants to get plugged back into cardiology and consider lipid clinic for PCSK9 inhibitor. Will update direct LDL

## 2016-06-29 NOTE — Assessment & Plan Note (Signed)
S: controlled on metoprolol 25mg  XL with addition of ramipril 5mg  last visit.  BP Readings from Last 3 Encounters:  06/29/16 (!) 144/60  06/23/16 (!) 140/53  04/02/16 138/68  A/P:Continue current meds:  Hopeful for SBP <135 with ascending aortic aneurysm though could push for lower- we opted to get bmet first but then consider increase of ramipril

## 2016-06-29 NOTE — Assessment & Plan Note (Signed)
Trial of cymbalta instead of zoloft to see if helps with neuropathy

## 2016-06-29 NOTE — Progress Notes (Signed)
Subjective:  Kevin Mcclain is a 71 y.o. year old very pleasant male patient who presents for/with See problem oriented charting ROS- No chest pain or shortness of breath. No edema or blurry vision.  Continued low back pain. Pain in hands as well  Past Medical History-  Patient Active Problem List   Diagnosis Date Noted  . Ascending aortic aneurysm ([place]) 03/25/2016    Priority: High  . Cancer of tonsillar fossa ([place]) 10/31/2014    Priority: High  . Osteoarthritis 11/18/2010    Priority: High  . CAD (coronary artery disease) 10/11/2008    Priority: High  . Type 2 diabetes mellitus with peripheral neuropathy ([place]) 01/24/2007    Priority: High  . Depression ([place]) 04/02/2016    Priority: Medium  . Diabetic neuropathy ([place]) 12/26/2015    Priority: Medium  . Gout 03/28/2007    Priority: Medium  . HYPERLIPIDEMIA 01/24/2007    Priority: Medium  . Essential hypertension 01/24/2007    Priority: Medium  . G tube feedings ([place]) 06/10/2015    Priority: Low  . GERD (gastroesophageal reflux disease) 06/10/2015    Priority: Low  . Arthropathy 03/10/2013    Priority: Low  . Low back pain 11/26/2011    Priority: Low  . Kidney stone 11/04/2011    Priority: Low  . Herpes zoster 07/15/2015    Medications- reviewed and updated Current Outpatient Prescriptions  Medication Sig Dispense Refill  . aspirin 81 MG tablet Take 81 mg by mouth daily.    Marland Kitchen atorvastatin (LIPITOR) 10 MG tablet Place 1 tablet (10 mg total) into feeding tube daily. 90 tablet 3  . fentaNYL (DURAGESIC - DOSED MCG/HR) 50 MCG/HR Place 1 patch (50 mcg total) onto the skin every 3 (three) days. 10 patch 0  . LORazepam (ATIVAN) 0.5 MG tablet Take 1 tablet (0.5 mg total) by mouth every 8 (eight) hours as needed for anxiety. 90 tablet 0  . metFORMIN (GLUCOPHAGE) 500 MG tablet Take 1 tablet (500 mg total) by mouth daily with breakfast. 90 tablet 3  . metoprolol succinate (TOPROL-XL) 25 MG 24 hr tablet Take 25 mg by mouth daily.     . Nutritional Supplements (FEEDING SUPPLEMENT, VITAL 1.5 CAL,) LIQD Begin Vital 1.5 at 20 cc/hr continuously on Friday, 1-24.  If tolerated, increase to 25 cc/hr day 3 and 30 cc/hr on Day 4. Flush with 120 cc free water QID plus and additional 720 cc water by mouth daily.  Please send supplies to home and instruct patient on continuous feeding pump. 1422 mL   . ondansetron (ZOFRAN) 4 MG tablet Take 1 tablet (4 mg total) by mouth every 8 (eight) hours as needed for nausea or vomiting. 30 tablet 5  . OVER THE COUNTER MEDICATION Place 1 drop into both eyes 3 (three) times daily as needed (dry eyes/ irritation). Clear Cooling Eye Drops    . oxyCODONE (ROXICODONE) 5 MG/5ML solution Take 10-15 mLs (10-15 mg total) by mouth every 4 (four) hours as needed for severe pain. 2000 mL 0  . pantoprazole (PROTONIX) 40 MG tablet Take 1 tablet (40 mg total) by mouth daily. 30 tablet 5  . polyethylene glycol (MIRALAX / GLYCOLAX) packet Take 17 g by mouth daily as needed.    . prochlorperazine (COMPAZINE) 10 MG tablet Take 1 tablet (10 mg total) by mouth every 6 (six) hours as needed for nausea or vomiting. 90 tablet 4  . ramipril (ALTACE) 5 MG capsule Take 1 capsule (5 mg total) by mouth daily. 90 capsule  3  . sodium fluoride (PREVIDENT 5000 PLUS) 1.1 % CREA dental cream Apply to tooth brush. Brush teeth for 2 minutes. Spit out excess-DO NOT swallow. Repeat nightly. 1 Tube prn  . DULoxetine (CYMBALTA) 30 MG capsule Take 1 capsule (30 mg total) by mouth daily. 30 capsule 5  . nitroGLYCERIN (NITROSTAT) 0.4 MG SL tablet Place 1 tablet (0.4 mg total) under the tongue every 5 (five) minutes as needed. 50 tablet 11   No current facility-administered medications for this visit.     Objective: BP (!) 144/60 (BP Location: Left Arm, Patient Position: Sitting, Cuff Size: Normal)   Pulse (!) 53   Temp 98.2 F (36.8 C) (Oral)   Ht 5\' 4"  (1.626 m)   Wt 126 lb 9.6 oz (57.4 kg)   SpO2 96%   BMI 21.73 kg/m  Gen: NAD,  resting comfortably CV: RRR no murmurs rubs or gallops Lungs: CTAB no crackles, wheeze, rhonchi Abdomen: soft/nontender/nondistended. Did not remove dressing today from recent g tube removal.  Ext: no edema Skin: warm, dry  Assessment/Plan:   HYPERLIPIDEMIA S: tried to titrate atorvastatin 10mg  to 4 days a week up from 3 but didn't tolerate. Abdominal pain with statin.   Lab Results  Component Value Date   CHOL 153 09/09/2015   HDL 55.50 09/09/2015   LDLCALC 53 12/04/2013   LDLDIRECT 109.0 12/26/2015   TRIG 58.0 09/09/2015   CHOLHDL 3 12/04/2013   A/P: wants to get plugged back into cardiology and consider lipid clinic for PCSK9 inhibitor. Will update direct LDL  Depression ([place]) S: last visit started on zoloft 50mg . No SI . Symptoms improved but hoping for more pain control from medicine with long term neuropathy A/P: per avs "Once you start cymbalta. For first 3 days take half tablet of zoloft then stop the zoloft completely. Follow 2 months"  Type 2 diabetes mellitus with peripheral neuropathy ([place]) S: previously well controlled. On metformin once a day. Off meds cbgs into 140s so prefers to be on.  Lab Results  Component Value Date   HGBA1C 5.7 12/26/2015   HGBA1C 5.9 09/09/2015   HGBA1C 5.4 04/19/2015   A/P: update a1c today  Essential hypertension S: controlled on metoprolol 25mg  XL with addition of ramipril 5mg  last visit.  BP Readings from Last 3 Encounters:  06/29/16 (!) 144/60  06/23/16 (!) 140/53  04/02/16 138/68  A/P:Continue current meds:  Hopeful for SBP <135 with ascending aortic aneurysm though could push for lower- we opted to get bmet first but then consider increase of ramipril  Diabetic neuropathy ([place]) Trial of cymbalta instead of zoloft to see if helps with neuropathy  Return in about 2 months (around 08/29/2016).  Orders Placed This Encounter  Procedures  . Hemoglobin A1c    [place]  . CBC    [place]  . Comprehensive metabolic panel     [place]  . LDL cholesterol, direct    [place]  . TSH    [place]  . Ambulatory referral to Cardiology    Referral Priority:   Routine    Referral Type:   Consultation    Referral Reason:   Specialty Services Required    Requested Specialty:   Cardiology    Number of Visits Requested:   1    Meds ordered this encounter  Medications  . DULoxetine (CYMBALTA) 30 MG capsule    Sig: Take 1 capsule (30 mg total) by mouth daily.    Dispense:  30 capsule    Refill:  5  Return precautions advised.  Garret Reddish, MD

## 2016-06-30 ENCOUNTER — Telehealth: Payer: Self-pay | Admitting: Family Medicine

## 2016-06-30 NOTE — Telephone Encounter (Signed)
Kevin Mcclain Pts wife returning the call.

## 2016-07-01 NOTE — Telephone Encounter (Signed)
Pt wife is returning jamie call. Pt wife is aware Roselyn Reef is not in this afternoon

## 2016-07-01 NOTE — Telephone Encounter (Signed)
Called and left a voicemail message asking for a return phone call 

## 2016-07-09 ENCOUNTER — Other Ambulatory Visit: Payer: Self-pay

## 2016-07-09 DIAGNOSIS — R7989 Other specified abnormal findings of blood chemistry: Secondary | ICD-10-CM

## 2016-07-09 MED ORDER — RAMIPRIL 5 MG PO CAPS: 5.0000 mg | ORAL_CAPSULE | Freq: Two times a day (BID) | ORAL | 1 refills | Status: DC

## 2016-07-13 ENCOUNTER — Other Ambulatory Visit (INDEPENDENT_AMBULATORY_CARE_PROVIDER_SITE_OTHER): Payer: Medicare HMO

## 2016-07-13 DIAGNOSIS — R946 Abnormal results of thyroid function studies: Secondary | ICD-10-CM

## 2016-07-13 DIAGNOSIS — R7989 Other specified abnormal findings of blood chemistry: Secondary | ICD-10-CM

## 2016-07-13 LAB — T4, FREE: Free T4: 0.71 ng/dL (ref 0.60–1.60)

## 2016-07-13 LAB — TSH: TSH: 8.38 u[IU]/mL — AB (ref 0.35–4.50)

## 2016-07-13 LAB — T3, FREE: T3, Free: 3.2 pg/mL (ref 2.3–4.2)

## 2016-07-14 NOTE — Telephone Encounter (Signed)
Kevin Mcclain pt is returning your call

## 2016-07-16 NOTE — Telephone Encounter (Signed)
Called and spoke with patient regarding lab results.

## 2016-07-23 ENCOUNTER — Other Ambulatory Visit (HOSPITAL_COMMUNITY): Payer: Self-pay | Admitting: Adult Health

## 2016-07-23 ENCOUNTER — Telehealth (HOSPITAL_COMMUNITY): Payer: Self-pay | Admitting: *Deleted

## 2016-07-23 ENCOUNTER — Encounter (HOSPITAL_COMMUNITY): Payer: Self-pay | Admitting: Adult Health

## 2016-07-23 DIAGNOSIS — C099 Malignant neoplasm of tonsil, unspecified: Secondary | ICD-10-CM

## 2016-07-23 DIAGNOSIS — R070 Pain in throat: Secondary | ICD-10-CM

## 2016-07-23 MED ORDER — FENTANYL 50 MCG/HR TD PT72: 50.0000 ug | MEDICATED_PATCH | TRANSDERMAL | 0 refills | Status: DC

## 2016-07-23 MED FILL — oxyCODONE HCL 5 MG/5ML SOLN: 5 | 23 days supply | Qty: 2000 | Fill #0

## 2016-07-23 NOTE — Progress Notes (Signed)
Patient called cancer center requesting refill of Fentanyl patch.   [place] Controlled Substance Reporting System reviewed and refill is appropriate on or after 07/23/16. Paper prescription printed & post-dated; Rx left at cancer center front desk for patient to retrieve after showing photo ID per clinic policy.   [place]CCSRS reviewed:     [name], NP [place] [phone]

## 2016-07-23 NOTE — Telephone Encounter (Signed)
Printed.  gwd 

## 2016-07-24 MED FILL — fentaNYL 50 MCG/HR PT72: 50 | 30 days supply | Qty: 10 | Fill #0

## 2016-08-20 ENCOUNTER — Encounter (HOSPITAL_COMMUNITY): Payer: Self-pay | Admitting: Adult Health

## 2016-08-20 ENCOUNTER — Other Ambulatory Visit (HOSPITAL_COMMUNITY): Payer: Self-pay | Admitting: Adult Health

## 2016-08-20 DIAGNOSIS — C099 Malignant neoplasm of tonsil, unspecified: Secondary | ICD-10-CM

## 2016-08-20 DIAGNOSIS — R070 Pain in throat: Secondary | ICD-10-CM

## 2016-08-20 MED ORDER — OXYCODONE HCL 5 MG/5ML PO SOLN: 10.0000 mg | ORAL | 0 refills | Status: DC | PRN

## 2016-08-20 MED ORDER — FENTANYL 50 MCG/HR TD PT72: 50.0000 ug | MEDICATED_PATCH | TRANSDERMAL | 0 refills | Status: DC

## 2016-08-20 MED FILL — oxyCODONE HCL 5 MG/5ML SOLN: 5 | 22 days supply | Qty: 2000 | Fill #0

## 2016-08-20 NOTE — Progress Notes (Signed)
Patient called cancer center requesting refill of Fentanyl patch and Oxycodone.   [place] Controlled Substance Reporting System reviewed and refill is appropriate on or after 08/20/16 (oxycodone) & 08/24/16 (fentanyl patch). Paper prescriptions printed & post-dated; Rxs left at cancer center front desk for patient to retrieve after showing photo ID per clinic policy.   NCCSRS reviewed:     [name], NP [place] [phone]

## 2016-08-21 MED FILL — fentaNYL 50 MCG/HR PT72: 50 | 30 days supply | Qty: 10 | Fill #0

## 2016-08-28 ENCOUNTER — Encounter: Payer: Self-pay | Admitting: Family Medicine

## 2016-08-28 ENCOUNTER — Ambulatory Visit (INDEPENDENT_AMBULATORY_CARE_PROVIDER_SITE_OTHER): Payer: Medicare HMO | Admitting: Family Medicine

## 2016-08-28 DIAGNOSIS — F320 Major depressive disorder, single episode, mild: Secondary | ICD-10-CM

## 2016-08-28 DIAGNOSIS — E1142 Type 2 diabetes mellitus with diabetic polyneuropathy: Secondary | ICD-10-CM

## 2016-08-28 DIAGNOSIS — E782 Mixed hyperlipidemia: Secondary | ICD-10-CM

## 2016-08-28 DIAGNOSIS — I10 Essential (primary) hypertension: Secondary | ICD-10-CM | POA: Diagnosis not present

## 2016-08-28 DIAGNOSIS — R69 Illness, unspecified: Secondary | ICD-10-CM | POA: Diagnosis not present

## 2016-08-28 MED ORDER — DULOXETINE HCL 60 MG PO CPEP: 60.0000 mg | ORAL_CAPSULE | Freq: Every day | ORAL | 5 refills | Status: DC

## 2016-08-28 MED ORDER — NITROGLYCERIN 0.4 MG SL SUBL
0.4000 mg | SUBLINGUAL_TABLET | SUBLINGUAL | 3 refills | Status: DC | PRN
Start: 2016-08-28 — End: 2021-12-10

## 2016-08-28 NOTE — Progress Notes (Signed)
Subjective:  Kevin Mcclain is a 71 y.o. year old very pleasant male patient who presents for/with See problem oriented charting ROS- some myalgias. No chest pain. Poor appetite but not losing weight. No edema. Still with chronic pain in feet from neuropathy   Past Medical History-  Patient Active Problem List   Diagnosis Date Noted  . Ascending aortic aneurysm ([place]) 03/25/2016    Priority: High  . Cancer of tonsillar fossa ([place]) 10/31/2014    Priority: High  . Osteoarthritis 11/18/2010    Priority: High  . CAD (coronary artery disease) 10/11/2008    Priority: High  . Type 2 diabetes mellitus with peripheral neuropathy ([place]) 01/24/2007    Priority: High  . Depression ([place]) 04/02/2016    Priority: Medium  . Diabetic neuropathy ([place]) 12/26/2015    Priority: Medium  . Gout 03/28/2007    Priority: Medium  . HYPERLIPIDEMIA 01/24/2007    Priority: Medium  . Essential hypertension 01/24/2007    Priority: Medium  . G tube feedings ([place]) 06/10/2015    Priority: Low  . GERD (gastroesophageal reflux disease) 06/10/2015    Priority: Low  . Arthropathy 03/10/2013    Priority: Low  . Low back pain 11/26/2011    Priority: Low  . Kidney stone 11/04/2011    Priority: Low  . Herpes zoster 07/15/2015    Medications- reviewed and updated Current Outpatient Prescriptions  Medication Sig Dispense Refill  . aspirin 81 MG tablet Take 81 mg by mouth daily.    Marland Kitchen atorvastatin (LIPITOR) 10 MG tablet Place 1 tablet (10 mg total) into feeding tube daily. 90 tablet 3  . DULoxetine (CYMBALTA) 30 MG capsule Take 1 capsule (30 mg total) by mouth daily. 30 capsule 5  . fentaNYL (DURAGESIC - DOSED MCG/HR) 50 MCG/HR Place 1 patch (50 mcg total) onto the skin every 3 (three) days. 10 patch 0  . metFORMIN (GLUCOPHAGE) 500 MG tablet Take 1 tablet (500 mg total) by mouth daily with breakfast. 90 tablet 3  . metoprolol succinate (TOPROL-XL) 25 MG 24 hr tablet Take 25 mg by mouth daily.    . Nutritional  Supplements (FEEDING SUPPLEMENT, VITAL 1.5 CAL,) LIQD Begin Vital 1.5 at 20 cc/hr continuously on Friday, 1-24.  If tolerated, increase to 25 cc/hr day 3 and 30 cc/hr on Day 4. Flush with 120 cc free water QID plus and additional 720 cc water by mouth daily.  Please send supplies to home and instruct patient on continuous feeding pump. 1422 mL   . OVER THE COUNTER MEDICATION Place 1 drop into both eyes 3 (three) times daily as needed (dry eyes/ irritation). Clear Cooling Eye Drops    . oxyCODONE (ROXICODONE) 5 MG/5ML solution Take 10-15 mLs (10-15 mg total) by mouth every 4 (four) hours as needed for severe pain. 2000 mL 0  . prochlorperazine (COMPAZINE) 10 MG tablet Take 1 tablet (10 mg total) by mouth every 6 (six) hours as needed for nausea or vomiting. 90 tablet 4  . ramipril (ALTACE) 5 MG capsule Take 1 capsule (5 mg total) by mouth 2 (two) times daily. 180 capsule 1  . sodium fluoride (PREVIDENT 5000 PLUS) 1.1 % CREA dental cream Apply to tooth brush. Brush teeth for 2 minutes. Spit out excess-DO NOT swallow. Repeat nightly. 1 Tube prn  . nitroGLYCERIN (NITROSTAT) 0.4 MG SL tablet Place 1 tablet (0.4 mg total) under the tongue every 5 (five) minutes as needed. 50 tablet 11   No current facility-administered medications for this visit.  Objective: BP 120/62 (BP Location: Left Arm, Patient Position: Sitting, Cuff Size: Normal)   Pulse (!) 54   Temp 98.1 F (36.7 C) (Oral)   Ht 5\' 4"  (1.626 m)   Wt 125 lb 3.2 oz (56.8 kg)   SpO2 97%   BMI 21.49 kg/m  Gen: NAD, resting comfortably CV: RRR no murmurs rubs or gallops Lungs: CTAB no crackles, wheeze, rhonchi Abdomen: soft/nontender/nondistended/normal bowel sounds. thin Ext: no edema Skin: warm, dry  Assessment/Plan:  Reviewed prior a1c great and current wt stable.   Depression ([place]) S: last visit titrated off zoloft 50mg  and onto cymbalta 30mg - hope was that it would help with pain as well with neuropathy. PHQ9 of 13 with no SI.   States mildly better than zoloft A/P:will titrate to 60mg  cymbalta and follow up in 2 months. Discussed side effects and as always should contact us immediately with an SI   Diabetic neuropathy ([place]) S: neuropathy with pain in both feet. cymbalta trial at 30mg . Minimal help A/P: titrate to 60mg  cymbalta with 2 month follow up  Essential hypertension S: controlled on metoprolol 25mg  and ramipril 5mg  now BID (ascending aortic aneurysm so prefer under 135 or 140 but was at least under [facility] goal 150 at last visit and even better today).  ASCVD 10 year risk calculation if age 51-79: on statin and trying to reduce lipids BP Readings from Last 3 Encounters:  08/28/16 120/62  06/29/16 (!) 144/60  06/23/16 (!) 140/53  A/P: We discussed blood pressure goal of <140/90. Continue current meds:  Excellent control with metoprolol 25mg  XL and ramipril 5mg  BID   HYPERLIPIDEMIA Encouraged follow up with Cardiology/lipid clinic -not yet called. Still with myalgias on atorvastatin 10mg  3x a week but better than 4x a week. Also had nausea with 4x a week.   Return in about 8 weeks (around 10/23/2016).  Meds ordered this encounter  Medications  . DISCONTD: DULoxetine (CYMBALTA) 60 MG capsule    Sig: Take 1 capsule (60 mg total) by mouth daily.    Dispense:  30 capsule    Refill:  5  . DULoxetine (CYMBALTA) 60 MG capsule    Sig: Take 1 capsule (60 mg total) by mouth daily.    Dispense:  30 capsule    Refill:  5  . nitroGLYCERIN (NITROSTAT) 0.4 MG SL tablet    Sig: Place 1 tablet (0.4 mg total) under the tongue every 5 (five) minutes as needed.    Dispense:  50 tablet    Refill:  3   Return precautions advised.  Garret Reddish, MD

## 2016-08-28 NOTE — Patient Instructions (Signed)
Blood pressure looks great!   Lets try cymbalta 60mg . If side effects are too great, we could trial 40mg 

## 2016-08-28 NOTE — Assessment & Plan Note (Signed)
S: controlled on metoprolol 25mg  and ramipril 5mg  now BID (ascending aortic aneurysm so prefer under 135 or 140 but was at least under [facility] goal 150 at last visit and even better today).  ASCVD 10 year risk calculation if age 73-79: on statin and trying to reduce lipids BP Readings from Last 3 Encounters:  08/28/16 120/62  06/29/16 (!) 144/60  06/23/16 (!) 140/53  A/P: We discussed blood pressure goal of <140/90. Continue current meds:  Excellent control with metoprolol 25mg  XL and ramipril 5mg  BID

## 2016-08-28 NOTE — Assessment & Plan Note (Signed)
S: last visit titrated off zoloft 50mg  and onto cymbalta 30mg - hope was that it would help with pain as well with neuropathy. PHQ9 of 13 with no SI.  States mildly better than zoloft A/P:will titrate to 60mg  cymbalta and follow up in 2 months. Discussed side effects and as always should contact us immediately with an SI

## 2016-08-28 NOTE — Assessment & Plan Note (Addendum)
Encouraged follow up with Cardiology/lipid clinic -not yet called. Still with myalgias on atorvastatin 10mg  3x a week but better than 4x a week. Also had nausea with 4x a week.

## 2016-08-28 NOTE — Assessment & Plan Note (Signed)
S: neuropathy with pain in both feet. cymbalta trial at 30mg . Minimal help A/P: titrate to 60mg  cymbalta with 2 month follow up

## 2016-09-03 ENCOUNTER — Encounter: Payer: Self-pay | Admitting: Radiation Oncology

## 2016-09-03 NOTE — Progress Notes (Signed)
Kevin Mcclain presents for follow up of radiation completed 01/18/15 to his Right Tonsil and bilateral neck.   Pain issues, if any: He continues to have burning to his neck with sensitivity to sun.  Using a feeding tube?: Removed 06/25/16 Weight changes, if any:  Swallowing issues, if any: He has a dry mouth which causes difficulty eating. He needs to [employer].  Smoking or chewing tobacco? No Using fluoride trays daily? He is using prevident toothpaste Last ENT visit was on: Dr. Erik Obey 5/70/17, to schedule next one for 6 months.  Other notable issues, if any:  [name] NP 06/23/16. He wakes up in the morning most everyday with a loud noise in his Right Ear for one month.  He continues to report taste changes.  He is asking for refills on oxycodone and fentanyl patch  BP 128/61   Pulse (!) 58   Temp 97.9 F (36.6 C)   Ht 5\' 4"  (1.626 m)   Wt 125 lb 6.4 oz (56.9 kg)   SpO2 99% Comment: room air  BMI 21.52 kg/m    Wt Readings from Last 3 Encounters:  09/08/16 125 lb 6.4 oz (56.9 kg)  08/28/16 125 lb 3.2 oz (56.8 kg)  06/29/16 126 lb 9.6 oz (57.4 kg)

## 2016-09-08 ENCOUNTER — Ambulatory Visit
Admission: RE | Admit: 2016-09-08 | Discharge: 2016-09-08 | Disposition: A | Discharge status: Home / Self Care | Payer: Medicare HMO | Source: Ambulatory Visit | Attending: Radiation Oncology | Admitting: Radiation Oncology

## 2016-09-08 ENCOUNTER — Encounter: Payer: Self-pay | Admitting: Radiation Oncology

## 2016-09-08 VITALS — BP 128/61 | HR 58 | Temp 97.9°F | Ht 64.0 in | Wt 125.4 lb

## 2016-09-08 DIAGNOSIS — R6889 Other general symptoms and signs: Secondary | ICD-10-CM

## 2016-09-08 DIAGNOSIS — H9313 Tinnitus, bilateral: Secondary | ICD-10-CM | POA: Diagnosis not present

## 2016-09-08 DIAGNOSIS — Z08 Encounter for follow-up examination after completed treatment for malignant neoplasm: Secondary | ICD-10-CM | POA: Diagnosis not present

## 2016-09-08 DIAGNOSIS — C090 Malignant neoplasm of tonsillar fossa: Secondary | ICD-10-CM

## 2016-09-08 DIAGNOSIS — Z8522 Personal history of malignant neoplasm of nasal cavities, middle ear, and accessory sinuses: Secondary | ICD-10-CM | POA: Diagnosis not present

## 2016-09-08 HISTORY — DX: Personal history of irradiation: Z92.3

## 2016-09-08 NOTE — Progress Notes (Signed)
Radiation Oncology         [phone] ________________________________  Name: Kevin Mcclain MRN: [id number]  Date: 09/08/2016  DOB: [birth year]  Follow-Up Visit Note  CC: Marin Olp, MD  Jodi Marble, MD  Diagnosis and Prior Radiotherapy:       ICD-10-CM   1. Cold intolerance R68.89 TSH    T4, free  2. 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  Patient's TSH was 8.38 on 07/13/16. Free T4 was 0.71 and free T3 was 3.2. His PCP decided to repeat tests in 6 weeks, though I don't see evidence of these tests being done. It looks like the patient did not call to schedule follow up lab.  On review of systems, the patient reports continued feeling of burning to his neck, as well as sensitivity to the sun. He reports ongoing dry mouth which causes some difficulty eating. He moistens his food as needed to aid in swallowing. The patient reports ongoing taste changes. He reports waking up with a loud noise, like buzzing, in his right ear; this has been ongoing for 1 month. The patient reports feeling cold al; the time, and is concerned about his TSH. He requests refills of oxycodone and fentanyl patches today. [name] is managing his pain at [facility] and his pain is much improved.   The patient's feeding tube was removed 06/25/16. He is not using tobacco products. The patient last saw Dr. Erik Obey on 7/51/02, and is due for 6 month follow up this month. He continues to follow with [name], NP at [facility] and will see her again on 12/17/16.  ALLERGIES:  is allergic to benzonatate;  codeine; and sitagliptin phosphate.  Meds: Current Outpatient Prescriptions  Medication Sig Dispense Refill  . aspirin 81 MG tablet Take 81 mg by mouth daily. Does not take everyday    . atorvastatin (LIPITOR) 10 MG tablet Place 1 tablet (10 mg total) into feeding tube daily. 90 tablet 3  . DULoxetine (CYMBALTA) 60 MG capsule Take 1 capsule (60 mg total) by mouth daily. 30 capsule 5  . fentaNYL (DURAGESIC - DOSED MCG/HR) 50 MCG/HR Place 1 patch (50 mcg total) onto the skin every 3 (three) days. 10 patch 0  . metFORMIN (GLUCOPHAGE) 500 MG tablet Take 1 tablet (500 mg total) by mouth daily with breakfast. 90 tablet 3  . metoprolol succinate (TOPROL-XL) 25 MG 24 hr tablet Take 25 mg by mouth daily.    . nitroGLYCERIN (NITROSTAT) 0.4 MG SL tablet Place 1 tablet (0.4 mg total) under the tongue every 5 (five) minutes as needed. 50 tablet 3  . OVER THE COUNTER MEDICATION Place 1 drop into both eyes 3 (three) times daily as needed (dry eyes/ irritation). Clear Cooling Eye Drops    . oxyCODONE (ROXICODONE) 5 MG/5ML solution Take 10-15 mLs (10-15 mg total) by mouth every 4 (four) hours as needed for severe pain. 2000 mL 0  . prochlorperazine (COMPAZINE) 10 MG tablet Take 1 tablet (10 mg total) by mouth every 6 (six) hours as needed for nausea or vomiting. 90 tablet 4  .  ramipril (ALTACE) 5 MG capsule Take 1 capsule (5 mg total) by mouth 2 (two) times daily. 180 capsule 1  . sodium fluoride (PREVIDENT 5000 PLUS) 1.1 % CREA dental cream Apply to tooth brush. Brush teeth for 2 minutes. Spit out excess-DO NOT swallow. Repeat nightly. 1 Tube prn   No current facility-administered medications for this encounter.      Physical Findings: The patient is in no acute distress. Patient is alert and oriented. Wt Readings from Last 3 Encounters:  09/08/16 125 lb 6.4 oz (56.9 kg)  08/28/16 125 lb 3.2 oz (56.8 kg)  06/29/16 126 lb 9.6 oz (57.4 kg)    height is 5\' 4"  (1.626 m) and weight is 125 lb 6.4 oz (56.9 kg).  His temperature is 97.9 F (36.6 C). His blood pressure is 128/61 and his pulse is 58 (abnormal). His oxygen saturation is 99%.  General: Alert and oriented, in no acute distress. HEENT: Head is normocephalic. Dry mucous membranes. Oral cavity and oropharynx are clear.  Neck: Neck is a little bit fibrotic from radiation, without evidence of lymphadenopathy in the cervical or supraclavicular regions. Skin is well healed within the treatment fields. Lungs. Clear to auscultation bilaterally. Heart: Regular, rhythm, and rate. No murmur heard.  Skin: see Neck Exam. Skin healed nicely, intact Extremities: No edema in all extremities. Psychiatric: Judgment and insight are intact. Affect is appropriate.  Lab Findings: Lab Results  Component Value Date   WBC 5.9 06/29/2016   HGB 13.6 06/29/2016   HCT 39.7 06/29/2016   MCV 88.2 06/29/2016   PLT 218.0 06/29/2016    Lab Results  Component Value Date   TSH 8.38 (H) 07/13/2016    Radiographic Findings: No results found.  Impression/Plan:    1) Head and Neck Cancer Status: NED.  2) Nutritional Status: no active issues, maintaining weight, but eating is a chore.  He works hard on this  PEG tube: removed 06/25/16  3) Risk Factors: The patient has been educated about risk factors including  tobacco abuse; they understand that avoidance of tobacco is important to prevent  other cancers.  4) Swallowing: Functional.   5) Dental: Encouraged to continue regular followup with dentistry, and dental hygiene including fluoride rinses.    6) Thyroid function: Recheck today-- Lab Results  Component Value Date   TSH 8.38 (H) 07/13/2016    7) Other: I offered to refer the patient to Audiology Clinic for evaluation of the buzzing and ringing in his ears. The patient would like to discuss this with Dr. Erik Obey and will schedule follow up as needed.  I also discussed a referral to a pain specialist for evaluation. The patient has declined at this  time.  I advised the patient that it would be best to keep his narcotic prescriptions managed by one provider, so I will defer to [name], NP for management of these prescriptions.  The patient may continue to use Vitamin E cream to the skin within the treatment fields.  8) Patient will follow up with me on an as needed basis.  He will follow up with Dr. Erik Obey and [name], NP as indicated.  I spent 20 minutes face to face with the patient and more than 50% of that time was spent in counseling and/or coordination of care.  _____________________________________   Eppie Gibson, MD  This document serves as a record of services personally performed by Eppie Gibson, MD. It was created on her behalf by Maryla Morrow, a trained medical scribe.  The creation of this record is based on the scribe's personal observations and the provider's statements to them. This document has been checked and approved by the attending provider.

## 2016-09-09 LAB — T4, FREE: T4,Free(Direct): 1.02 ng/dL (ref 0.82–1.77)

## 2016-09-09 LAB — TSH: TSH: 15.215 m(IU)/L — ABNORMAL HIGH (ref 0.320–4.118)

## 2016-09-11 ENCOUNTER — Telehealth: Payer: Self-pay

## 2016-09-11 ENCOUNTER — Other Ambulatory Visit: Payer: Self-pay | Admitting: Radiation Oncology

## 2016-09-11 DIAGNOSIS — E039 Hypothyroidism, unspecified: Secondary | ICD-10-CM | POA: Insufficient documentation

## 2016-09-11 DIAGNOSIS — E038 Other specified hypothyroidism: Secondary | ICD-10-CM

## 2016-09-11 MED ORDER — LEVOTHYROXINE SODIUM 25 MCG PO TABS: 25.0000 ug | ORAL_TABLET | Freq: Every day | ORAL | 5 refills | Status: DC

## 2016-09-11 MED FILL — LEVOTHYROXINE 25 MCG TABLET: 25 | 30 days supply | Qty: 30 | Fill #0

## 2016-09-11 NOTE — Telephone Encounter (Signed)
I called and spoke to Kevin Mcclain regarding his recent TSH lab. It showed that his thyroid gland is a little sluggish and likely the reason for feeling cold. He will need to have his levels supplemented. Kevin Mcclain has sent a prescription for Levothyroxine to the [facility] I have instructed him to take it every morning on an empty stomach with water and without a vitamin or mineral supplement. He is also aware that he needs to have his PCP Kevin Mcclain follow him with labs to check the levels. He has an appointment with him in August. He voiced his understanding of the above and will call with any questions if needed.

## 2016-09-15 ENCOUNTER — Encounter (HOSPITAL_COMMUNITY): Payer: Self-pay | Admitting: Adult Health

## 2016-09-15 ENCOUNTER — Other Ambulatory Visit (HOSPITAL_COMMUNITY): Payer: Self-pay | Admitting: Adult Health

## 2016-09-15 DIAGNOSIS — R070 Pain in throat: Secondary | ICD-10-CM

## 2016-09-15 DIAGNOSIS — C099 Malignant neoplasm of tonsil, unspecified: Secondary | ICD-10-CM

## 2016-09-15 MED ORDER — OXYCODONE HCL 5 MG/5ML PO SOLN: 10.0000 mg | ORAL | 0 refills | Status: DC | PRN

## 2016-09-15 MED ORDER — FENTANYL 50 MCG/HR TD PT72: 50.0000 ug | MEDICATED_PATCH | TRANSDERMAL | 0 refills | Status: DC

## 2016-09-15 NOTE — Progress Notes (Signed)
Patient called cancer center requesting refill of Fentanyl patch and Oxycodone.   [place] Controlled Substance Reporting System reviewed and refill is appropriate. Paper prescriptions printed & post-dated; Rxs left at cancer center front desk for patient to retrieve after showing photo ID per clinic policy.   NCCSRS reviewed:     [name], NP [place] [phone]

## 2016-09-16 MED FILL — oxyCODONE HCL 5 MG/5ML SOLN: 5 | 22 days supply | Qty: 2000 | Fill #0

## 2016-09-18 MED FILL — fentaNYL 50 MCG/HR PT72: 50 | 30 days supply | Qty: 10 | Fill #0

## 2016-09-21 ENCOUNTER — Telehealth: Payer: Self-pay

## 2016-09-21 ENCOUNTER — Other Ambulatory Visit: Payer: Self-pay

## 2016-09-21 DIAGNOSIS — E0590 Thyrotoxicosis, unspecified without thyrotoxic crisis or storm: Secondary | ICD-10-CM

## 2016-09-21 NOTE — Telephone Encounter (Signed)
Called and spoke with patient. I reviewed over his next appointment with him and I scheduled him for a lab appointment. I am entering in his orders.

## 2016-09-21 NOTE — Telephone Encounter (Signed)
-----   Message from Marin Olp, MD sent at 09/11/2016  3:22 PM EDT ----- Dr. Isidore Moos- thanks so much! Roselyn Reef- can you set him up for repeat TSH in 6 weeks and then we will titrate as needed?  Thanks,  Garret Reddish  ----- Message ----- From: Eppie Gibson, MD Sent: 09/11/2016   1:32 PM To: Ernst Spell, RN, Marin Olp, MD, #  Anderson [place], please call pt and tell them that according to [facility] labs, his thyroid gland is a little sluggish and likely the reason for feeling cold... so we should supplement his levels.  I called this into [facility] pharmacy:  Leovthyroxine 25 mcg tab, take 1 tab QAM on empty stomach.  Disp 30; 5 refills.  Please remind to take w/ water, 45-60 min before eating. Don't take w/ vitamins or supplements.  Please advise him to continue labs with his PCP to verify he is on the right dose for the long term. Since he will need to be on this for life, I think it's best he manage this with his PCP.  I am ccing Dr. Yong Channel - Dr. Yong Channel, please let me know if this is a problem with you. Perhaps he could see you for repeat labs in the next month or two, as you see fit?  Thanks! Eppie Gibson MD

## 2016-10-01 DIAGNOSIS — H903 Sensorineural hearing loss, bilateral: Secondary | ICD-10-CM | POA: Diagnosis not present

## 2016-10-01 DIAGNOSIS — H9313 Tinnitus, bilateral: Secondary | ICD-10-CM | POA: Diagnosis not present

## 2016-10-01 DIAGNOSIS — H9113 Presbycusis, bilateral: Secondary | ICD-10-CM | POA: Diagnosis not present

## 2016-10-01 DIAGNOSIS — Z85818 Personal history of malignant neoplasm of other sites of lip, oral cavity, and pharynx: Secondary | ICD-10-CM | POA: Diagnosis not present

## 2016-10-09 ENCOUNTER — Other Ambulatory Visit: Payer: Self-pay

## 2016-10-09 ENCOUNTER — Telehealth: Payer: Self-pay | Admitting: Family Medicine

## 2016-10-09 MED ORDER — DULOXETINE HCL 30 MG PO CPEP: 30.0000 mg | ORAL_CAPSULE | Freq: Every day | ORAL | 3 refills | Status: DC

## 2016-10-09 NOTE — Telephone Encounter (Signed)
Yes thanks, may decrease back to 30mg  cymbalta

## 2016-10-09 NOTE — Telephone Encounter (Signed)
Pt thinks 60 mg of cymbalta is too much and would like to go back on 30 mg. Pt needs refill on [facility] mg. [facility]

## 2016-10-13 ENCOUNTER — Other Ambulatory Visit (HOSPITAL_COMMUNITY): Payer: Self-pay | Admitting: Adult Health

## 2016-10-13 ENCOUNTER — Encounter (HOSPITAL_COMMUNITY): Payer: Self-pay | Admitting: Adult Health

## 2016-10-13 DIAGNOSIS — R070 Pain in throat: Secondary | ICD-10-CM

## 2016-10-13 DIAGNOSIS — C099 Malignant neoplasm of tonsil, unspecified: Secondary | ICD-10-CM

## 2016-10-13 MED ORDER — OXYCODONE HCL 5 MG/5ML PO SOLN: 10.0000 mg | ORAL | 0 refills | Status: DC | PRN

## 2016-10-13 MED ORDER — FENTANYL 50 MCG/HR TD PT72: 50.0000 ug | MEDICATED_PATCH | TRANSDERMAL | 0 refills | Status: DC

## 2016-10-13 MED FILL — LEVOTHYROXINE 25 MCG TABLET: 25 | 30 days supply | Qty: 30 | Fill #1

## 2016-10-13 NOTE — Progress Notes (Signed)
Patient called cancer center requesting refill of Fentanyl patch and Oxycodone liquid.   [place] Controlled Substance Reporting System reviewed and refill is appropriate. Paper prescription printed & post-dated; Rx left at cancer center front desk for patient to retrieve after showing photo ID per clinic policy.   NCCSRS reviewed:     [name], NP [place] [phone]

## 2016-10-14 MED FILL — oxyCODONE HCL 5 MG/5ML SOLN: 5 | 22 days supply | Qty: 2000 | Fill #0

## 2016-10-16 MED FILL — fentaNYL 50 MCG/HR PT72: 50 | 30 days supply | Qty: 10 | Fill #0

## 2016-10-17 ENCOUNTER — Other Ambulatory Visit: Payer: Self-pay | Admitting: Family Medicine

## 2016-10-28 ENCOUNTER — Other Ambulatory Visit (INDEPENDENT_AMBULATORY_CARE_PROVIDER_SITE_OTHER): Payer: Medicare HMO

## 2016-10-28 DIAGNOSIS — E0590 Thyrotoxicosis, unspecified without thyrotoxic crisis or storm: Secondary | ICD-10-CM

## 2016-10-28 LAB — TSH: TSH: 4.68 u[IU]/mL — ABNORMAL HIGH (ref 0.35–4.50)

## 2016-10-30 ENCOUNTER — Telehealth: Payer: Self-pay | Admitting: Family Medicine

## 2016-10-30 ENCOUNTER — Encounter: Payer: Self-pay | Admitting: Family Medicine

## 2016-10-30 ENCOUNTER — Ambulatory Visit (INDEPENDENT_AMBULATORY_CARE_PROVIDER_SITE_OTHER): Payer: Medicare HMO | Admitting: Family Medicine

## 2016-10-30 ENCOUNTER — Other Ambulatory Visit: Payer: Self-pay

## 2016-10-30 VITALS — BP 104/66 | HR 65 | Temp 98.1°F | Ht 64.0 in | Wt 124.2 lb

## 2016-10-30 DIAGNOSIS — I10 Essential (primary) hypertension: Secondary | ICD-10-CM | POA: Diagnosis not present

## 2016-10-30 DIAGNOSIS — E038 Other specified hypothyroidism: Secondary | ICD-10-CM

## 2016-10-30 DIAGNOSIS — R358 Other polyuria: Secondary | ICD-10-CM | POA: Diagnosis not present

## 2016-10-30 DIAGNOSIS — R3589 Other polyuria: Secondary | ICD-10-CM

## 2016-10-30 DIAGNOSIS — N401 Enlarged prostate with lower urinary tract symptoms: Secondary | ICD-10-CM

## 2016-10-30 DIAGNOSIS — F320 Major depressive disorder, single episode, mild: Secondary | ICD-10-CM | POA: Diagnosis not present

## 2016-10-30 DIAGNOSIS — E1142 Type 2 diabetes mellitus with diabetic polyneuropathy: Secondary | ICD-10-CM

## 2016-10-30 DIAGNOSIS — E039 Hypothyroidism, unspecified: Secondary | ICD-10-CM

## 2016-10-30 DIAGNOSIS — R351 Nocturia: Secondary | ICD-10-CM | POA: Diagnosis not present

## 2016-10-30 DIAGNOSIS — R69 Illness, unspecified: Secondary | ICD-10-CM | POA: Diagnosis not present

## 2016-10-30 LAB — CBC
HCT: 40.7 % (ref 39.0–52.0)
HEMOGLOBIN: 13.8 g/dL (ref 13.0–17.0)
MCHC: 33.9 g/dL (ref 30.0–36.0)
MCV: 89 fl (ref 78.0–100.0)
PLATELETS: 222 10*3/uL (ref 150.0–400.0)
RBC: 4.57 Mil/uL (ref 4.22–5.81)
RDW: 14 % (ref 11.5–15.5)
WBC: 5 10*3/uL (ref 4.0–10.5)

## 2016-10-30 LAB — COMPREHENSIVE METABOLIC PANEL
ALT: 20 U/L (ref 0–53)
AST: 22 U/L (ref 0–37)
Albumin: 4.2 g/dL (ref 3.5–5.2)
Alkaline Phosphatase: 65 U/L (ref 39–117)
BUN: 9 mg/dL (ref 6–23)
CALCIUM: 9.4 mg/dL (ref 8.4–10.5)
CHLORIDE: 97 meq/L (ref 96–112)
CO2: 31 meq/L (ref 19–32)
Creatinine, Ser: 0.91 mg/dL (ref 0.40–1.50)
GFR: 86.75 mL/min (ref >60.00)
GLUCOSE: 104 mg/dL — AB (ref 70–99)
Potassium: 4.6 mEq/L (ref 3.5–5.1)
Sodium: 134 mEq/L — ABNORMAL LOW (ref 135–145)
Total Bilirubin: 0.7 mg/dL (ref 0.2–1.2)
Total Protein: 6.2 g/dL (ref 6.0–8.3)

## 2016-10-30 LAB — TSH: TSH: 3.05 u[IU]/mL (ref 0.35–4.50)

## 2016-10-30 LAB — HEMOGLOBIN A1C: Hgb A1c MFr Bld: 6.1 % (ref 4.6–6.5)

## 2016-10-30 MED ORDER — LEVOTHYROXINE SODIUM 50 MCG PO TABS: 50.0000 ug | ORAL_TABLET | Freq: Every day | ORAL | 3 refills | Status: DC

## 2016-10-30 MED ORDER — TAMSULOSIN HCL 0.4 MG PO CAPS: 0.4000 mg | ORAL_CAPSULE | Freq: Every day | ORAL | 5 refills | Status: DC

## 2016-10-30 NOTE — Addendum Note (Signed)
Addended by: Tomi Likens on: 10/30/2016 02:01 PM   Modules accepted: Orders

## 2016-10-30 NOTE — Addendum Note (Signed)
Addended by: Marin Olp on: 10/30/2016 05:36 PM   Modules accepted: Orders

## 2016-10-30 NOTE — Assessment & Plan Note (Signed)
S: over the last year has noted increase in nighttime urination- now up to 3-4x a night usually. No history of enlarged prostate. Stream is weaker. Pees more in daytime  Apparently on flomax years ago A/P: get urine culture and UA. Retrial flomax. On exam- mild BPH

## 2016-10-30 NOTE — Assessment & Plan Note (Signed)
S: trial zoloft without help for depression. Changed to cymbalta 30mg  and helped some- but increased to 60mg  with plan 2 month follow up. Hope was this would help with neuropathy as well from diabetes  Low energy, poor sleep on higher dose. Before thyroid medicine- attitude was better, more energy, felt like doing more.  A/P: PHQ9 down to 10 with longer period on cymbalta 30 mg- will leave dose alone given SE on higher dose and desire to stay on same med by patient. Offered counseling- declines for now but may concsier

## 2016-10-30 NOTE — Addendum Note (Signed)
Addended by: Netta Neat D on: 10/30/2016 01:54 PM   Modules accepted: Orders

## 2016-10-30 NOTE — Assessment & Plan Note (Signed)
titrated to levothyroxine 50 mcg yesterday- repeat 6 weeks Lab Results  Component Value Date   TSH 4.68 (H) 10/28/2016

## 2016-10-30 NOTE — Progress Notes (Signed)
Subjective:  Kevin Mcclain is a 71 y.o. year old very pleasant male patient who presents for/with See problem oriented charting ROS- no chest pain or shortness of breath, some anhedonia, sleeps easily in daytime.    Past Medical History-  Patient Active Problem List   Diagnosis Date Noted  . Hypothyroidism 09/11/2016    Priority: High  . Ascending aortic aneurysm ([place]) 03/25/2016    Priority: High  . Cancer of tonsillar fossa ([place]) 10/31/2014    Priority: High  . Osteoarthritis 11/18/2010    Priority: High  . CAD (coronary artery disease) 10/11/2008    Priority: High  . Type 2 diabetes mellitus with peripheral neuropathy ([place]) 01/24/2007    Priority: High  . Depression ([place]) 04/02/2016    Priority: Medium  . Diabetic neuropathy ([place]) 12/26/2015    Priority: Medium  . Gout 03/28/2007    Priority: Medium  . HYPERLIPIDEMIA 01/24/2007    Priority: Medium  . Essential hypertension 01/24/2007    Priority: Medium  . G tube feedings ([place]) 06/10/2015    Priority: Low  . GERD (gastroesophageal reflux disease) 06/10/2015    Priority: Low  . Arthropathy 03/10/2013    Priority: Low  . Low back pain 11/26/2011    Priority: Low  . Kidney stone 11/04/2011    Priority: Low  . BPH associated with nocturia 10/30/2016  . Herpes zoster 07/15/2015    Medications- reviewed and updated Current Outpatient Prescriptions  Medication Sig Dispense Refill  . aspirin 81 MG tablet Take 81 mg by mouth daily. Does not take everyday    . atorvastatin (LIPITOR) 10 MG tablet Place 1 tablet (10 mg total) into feeding tube daily. 90 tablet 3  . DULoxetine (CYMBALTA) 30 MG capsule Take 1 capsule (30 mg total) by mouth daily. 90 capsule 3  . fentaNYL (DURAGESIC - DOSED MCG/HR) 50 MCG/HR Place 1 patch (50 mcg total) onto the skin every 3 (three) days. 10 patch 0  . levothyroxine (SYNTHROID, LEVOTHROID) 50 MCG tablet Take 1 tablet (50 mcg total) by mouth daily. 90 tablet 3  . metFORMIN (GLUCOPHAGE) 500  MG tablet Take 1 tablet (500 mg total) by mouth daily with breakfast. 90 tablet 3  . metoprolol succinate (TOPROL-XL) 25 MG 24 hr tablet TAKE 1 TABLET (25 MG TOTAL) BY MOUTH DAILY. TAKE WITH OR IMMEDIATELY FOLLOWING A MEAL. 90 tablet 1  . nitroGLYCERIN (NITROSTAT) 0.4 MG SL tablet Place 1 tablet (0.4 mg total) under the tongue every 5 (five) minutes as needed. 50 tablet 3  . OVER THE COUNTER MEDICATION Place 1 drop into both eyes 3 (three) times daily as needed (dry eyes/ irritation). Clear Cooling Eye Drops    . oxyCODONE (ROXICODONE) 5 MG/5ML solution Take 10-15 mLs (10-15 mg total) by mouth every 4 (four) hours as needed for severe pain. 2000 mL 0  . prochlorperazine (COMPAZINE) 10 MG tablet Take 1 tablet (10 mg total) by mouth every 6 (six) hours as needed for nausea or vomiting. 90 tablet 4  . ramipril (ALTACE) 5 MG capsule Take 1 capsule (5 mg total) by mouth 2 (two) times daily. 180 capsule 1  . sodium fluoride (PREVIDENT 5000 PLUS) 1.1 % CREA dental cream Apply to tooth brush. Brush teeth for 2 minutes. Spit out excess-DO NOT swallow. Repeat nightly. 1 Tube prn   No current facility-administered medications for this visit.     Objective: BP 104/66 (BP Location: Left Arm, Patient Position: Sitting, Cuff Size: Normal)   Pulse 65   Temp 98.1  F (36.7 C) (Oral)   Ht 5\' 4"  (1.626 m)   Wt 124 lb 3.2 oz (56.3 kg)   SpO2 97%   BMI 21.32 kg/m  Gen: NAD, resting comfortably No thyromegaly CV: RRR no murmurs rubs or gallops Lungs: CTAB no crackles, wheeze, rhonchi Abdomen: soft/nontender/nondistended/thin Ext: no edema Neuro: grossly normal, moves all extremities Rectal: normal tone, slightly enlarged prostate, no masses or tenderness  Assessment/Plan:  BPH associated with nocturia S: over the last year has noted increase in nighttime urination- now up to 3-4x a night usually. No history of enlarged prostate. Stream is weaker. Pees more in daytime  Apparently on flomax years  ago A/P: get urine culture and UA. Retrial flomax. On exam- mild BPH  Essential hypertension S: controlled on ramipril 5mg  BID, metoprolol 25mg - prefer <135 or 140 with ascending aortic aneurysm.  BP Readings from Last 3 Encounters:  10/30/16 104/66  09/08/16 128/61  08/28/16 120/62  A/P: We discussed blood pressure goal of <140/90. Continue current meds  Depression ([place]) S: trial zoloft without help for depression. Changed to cymbalta 30mg  and helped some- but increased to 60mg  with plan 2 month follow up. Hope was this would help with neuropathy as well from diabetes  Low energy, poor sleep on higher dose. Before thyroid medicine- attitude was better, more energy, felt like doing more.  A/P: PHQ9 down to 10 with longer period on cymbalta 30 mg- will leave dose alone given SE on higher dose and desire to stay on same med by patient. Offered counseling- declines for now but may concsier  Hypothyroidism titrated to levothyroxine 50 mcg yesterday- repeat 6 weeks Lab Results  Component Value Date   TSH 4.68 (H) 10/28/2016     Type 2 diabetes mellitus with peripheral neuropathy ([name]) Update a1c- doubt this is the cause of polyuria. Not on meds.   Future Appointments Date Time Provider [place]  12/10/2016 10:00 AM [facility] LAB [facility] None  12/17/2016 1:40 PM Holley Bouche, NP [facility] None   4 month  Orders Placed This Encounter  Procedures  . Urine Culture    [facility]  . Urinalysis    Standing Status:   Future    Standing Expiration Date:   10/30/2017  . Hemoglobin A1c    [place]  . CBC    [place]  . Comprehensive metabolic panel    [place]    Meds ordered this encounter  Medications  . levothyroxine (SYNTHROID, LEVOTHROID) 50 MCG tablet    Sig: Take 1 tablet (50 mcg total) by mouth daily.    Dispense:  90 tablet    Refill:  3    Return precautions advised.  Garret Reddish, MD

## 2016-10-30 NOTE — Telephone Encounter (Signed)
I sent this in and informed patient

## 2016-10-30 NOTE — Patient Instructions (Addendum)
Please stop by lab before you go  Trial flomax- watch out for feeling lightheaded with standing- will have to stop medicine if that is too much of an issue  Consider counseling- we have a lot of good folks within [facility]. For many patients- depression improves without increasing medicine further  Thanks for coming back in 6 weeks for thyroid recheck. Hopefully you will feel better on this higher dose.   I think its ok to stretch out to 4 months for our visits but obviously happy to see you sooner if needed

## 2016-10-30 NOTE — Telephone Encounter (Signed)
Per your OV note today:   Apparently on flomax years ago A/P: get urine culture and UA. Retrial flomax. On exam- mild BPH  Please advise of dosage and frequency and I will be happy to send in

## 2016-10-30 NOTE — Assessment & Plan Note (Signed)
Update a1c- doubt this is the cause of polyuria. Not on meds.

## 2016-10-30 NOTE — Assessment & Plan Note (Signed)
S: controlled on ramipril 5mg  BID, metoprolol 25mg - prefer <135 or 140 with ascending aortic aneurysm.  BP Readings from Last 3 Encounters:  10/30/16 104/66  09/08/16 128/61  08/28/16 120/62  A/P: We discussed blood pressure goal of <140/90. Continue current meds

## 2016-10-30 NOTE — Telephone Encounter (Signed)
° ° ° °  Pt call to say that there was suppose to a med called in to help pt with urinating a night. They check with the pharmacy and they did not have it    Pharmacy   [facility]

## 2016-11-05 ENCOUNTER — Other Ambulatory Visit: Payer: Self-pay

## 2016-11-05 DIAGNOSIS — E039 Hypothyroidism, unspecified: Secondary | ICD-10-CM

## 2016-11-09 ENCOUNTER — Other Ambulatory Visit: Payer: Self-pay | Admitting: Radiation Oncology

## 2016-11-09 ENCOUNTER — Telehealth (HOSPITAL_COMMUNITY): Payer: Self-pay | Admitting: *Deleted

## 2016-11-09 DIAGNOSIS — R070 Pain in throat: Secondary | ICD-10-CM

## 2016-11-09 DIAGNOSIS — C099 Malignant neoplasm of tonsil, unspecified: Secondary | ICD-10-CM

## 2016-11-09 MED ORDER — FENTANYL 50 MCG/HR TD PT72: 50.0000 ug | MEDICATED_PATCH | TRANSDERMAL | 0 refills | Status: DC

## 2016-11-09 MED ORDER — OXYCODONE HCL 5 MG/5ML PO SOLN: 10.0000 mg | ORAL | 0 refills | Status: DC | PRN

## 2016-11-09 MED FILL — oxyCODONE HCL 5 MG/5ML SOLN: 5 | 2 days supply | Qty: 180 | Fill #0

## 2016-11-10 ENCOUNTER — Other Ambulatory Visit (HOSPITAL_COMMUNITY): Payer: Self-pay | Admitting: Adult Health

## 2016-11-10 DIAGNOSIS — C099 Malignant neoplasm of tonsil, unspecified: Secondary | ICD-10-CM

## 2016-11-10 DIAGNOSIS — R070 Pain in throat: Secondary | ICD-10-CM

## 2016-11-10 MED ORDER — OXYCODONE HCL 5 MG/5ML PO SOLN: 10.0000 mg | ORAL | 0 refills | Status: DC | PRN

## 2016-11-10 MED ORDER — FENTANYL 50 MCG/HR TD PT72: 50.0000 ug | MEDICATED_PATCH | TRANSDERMAL | 0 refills | Status: DC

## 2016-11-10 MED FILL — oxyCODONE HCL 5 MG/5ML SOLN: 5 | 22 days supply | Qty: 2000 | Fill #0

## 2016-11-10 MED FILL — fentaNYL 50 MCG/HR PT72: 50 | 30 days supply | Qty: 10 | Fill #0

## 2016-11-10 NOTE — Telephone Encounter (Signed)
Pt's wife aware that pain medication refills are at the front desk of the cancer center for him to pick up. I apologized for the confusion and for their trouble.

## 2016-11-17 ENCOUNTER — Other Ambulatory Visit: Payer: Self-pay

## 2016-11-17 MED ORDER — RAMIPRIL 10 MG PO CAPS: 10.0000 mg | ORAL_CAPSULE | Freq: Every day | ORAL | 3 refills | Status: DC

## 2016-11-23 ENCOUNTER — Telehealth: Payer: Self-pay | Admitting: Family Medicine

## 2016-11-23 NOTE — Telephone Encounter (Signed)
pts wife is calling to see if Dr. Yong Channel would call something in for the pts nerves (body jumping when he is going to sleep and is sleeping) he has been on Ativan in the past and would like to see if he could get that again.  [facility] [place]  They are aware that Dr. Yong Channel is out of the office today and will be returning on 11/24/16.

## 2016-11-24 NOTE — Telephone Encounter (Signed)
We should avoid this considering he is on narcotic pain medications. It would be very high risk to combine these medications with Ativan.

## 2016-11-27 ENCOUNTER — Encounter: Payer: Self-pay | Admitting: Family Medicine

## 2016-11-27 ENCOUNTER — Ambulatory Visit (INDEPENDENT_AMBULATORY_CARE_PROVIDER_SITE_OTHER): Payer: Medicare HMO | Admitting: Family Medicine

## 2016-11-27 VITALS — BP 110/66 | HR 66 | Temp 97.7°F | Ht 64.0 in | Wt 127.2 lb

## 2016-11-27 DIAGNOSIS — M545 Low back pain, unspecified: Secondary | ICD-10-CM

## 2016-11-27 DIAGNOSIS — F320 Major depressive disorder, single episode, mild: Secondary | ICD-10-CM

## 2016-11-27 DIAGNOSIS — E038 Other specified hypothyroidism: Secondary | ICD-10-CM

## 2016-11-27 DIAGNOSIS — I10 Essential (primary) hypertension: Secondary | ICD-10-CM | POA: Diagnosis not present

## 2016-11-27 DIAGNOSIS — R69 Illness, unspecified: Secondary | ICD-10-CM | POA: Diagnosis not present

## 2016-11-27 MED ORDER — RAMIPRIL 5 MG PO CAPS: 10.0000 mg | ORAL_CAPSULE | Freq: Every day | ORAL | 5 refills | Status: DC

## 2016-11-27 MED ORDER — ESCITALOPRAM OXALATE 5 MG PO TABS: 5.0000 mg | ORAL_TABLET | Freq: Every day | ORAL | 5 refills | Status: DC

## 2016-11-27 MED ORDER — DULOXETINE HCL 20 MG PO CPEP: 20.0000 mg | ORAL_CAPSULE | Freq: Every day | ORAL | 0 refills | Status: DC

## 2016-11-27 NOTE — Telephone Encounter (Signed)
Patient was seen in the office today

## 2016-11-27 NOTE — Patient Instructions (Addendum)
Take 1 cymbalta 20mg  daily for 10 days Then take every other day 20mg  for 10 days  Then stop  Start lexapro 5mg  when you go down to every other day cymbalta   Recheck thyroid in 6 weeks. Ok to continue 25 mcg from now  Reduce ramipril from 10mg  to 5mg   If balance issues improve- may be able to restart flomax  Try 3x a day heat for 15 minutes for the low back. If no better in another 3 weeks could do some physical therapy. If worsens- we can refer you to orthopedics

## 2016-11-27 NOTE — Progress Notes (Signed)
Subjective:  Kevin Mcclain is a 71 y.o. year old very pleasant male patient who presents for/with See problem oriented charting ROS- feels imbalanced since being on cymbalta, did have one fall, no chest pain or shortness of breath. No edema.  Past Medical History-  Patient Active Problem List   Diagnosis Date Noted  . Hypothyroidism 09/11/2016    Priority: High  . Ascending aortic aneurysm ([place]) 03/25/2016    Priority: High  . Cancer of tonsillar fossa ([place]) 10/31/2014    Priority: High  . Osteoarthritis 11/18/2010    Priority: High  . CAD (coronary artery disease) 10/11/2008    Priority: High  . Type 2 diabetes mellitus with peripheral neuropathy ([place]) 01/24/2007    Priority: High  . Depression ([place]) 04/02/2016    Priority: Medium  . Diabetic neuropathy ([place]) 12/26/2015    Priority: Medium  . Gout 03/28/2007    Priority: Medium  . HYPERLIPIDEMIA 01/24/2007    Priority: Medium  . Essential hypertension 01/24/2007    Priority: Medium  . G tube feedings ([place]) 06/10/2015    Priority: Low  . GERD (gastroesophageal reflux disease) 06/10/2015    Priority: Low  . Arthropathy 03/10/2013    Priority: Low  . Low back pain 11/26/2011    Priority: Low  . Kidney stone 11/04/2011    Priority: Low  . BPH associated with nocturia 10/30/2016  . Herpes zoster 07/15/2015    Medications- reviewed and updated Current Outpatient Prescriptions  Medication Sig Dispense Refill  . aspirin 81 MG tablet Take 81 mg by mouth daily. Does not take everyday    . atorvastatin (LIPITOR) 10 MG tablet Place 1 tablet (10 mg total) into feeding tube daily. 90 tablet 3  . fentaNYL (DURAGESIC - DOSED MCG/HR) 50 MCG/HR Place 1 patch (50 mcg total) onto the skin every 3 (three) days. 10 patch 0  . levothyroxine (SYNTHROID, LEVOTHROID) 50 MCG tablet Take 1 tablet (50 mcg total) by mouth daily. 90 tablet 3  . metFORMIN (GLUCOPHAGE) 500 MG tablet Take 1 tablet (500 mg total) by mouth daily with breakfast. 90  tablet 3  . metoprolol succinate (TOPROL-XL) 25 MG 24 hr tablet TAKE 1 TABLET (25 MG TOTAL) BY MOUTH DAILY. TAKE WITH OR IMMEDIATELY FOLLOWING A MEAL. 90 tablet 1  . nitroGLYCERIN (NITROSTAT) 0.4 MG SL tablet Place 1 tablet (0.4 mg total) under the tongue every 5 (five) minutes as needed. 50 tablet 3  . OVER THE COUNTER MEDICATION Place 1 drop into both eyes 3 (three) times daily as needed (dry eyes/ irritation). Clear Cooling Eye Drops    . oxyCODONE (ROXICODONE) 5 MG/5ML solution Take 10-15 mLs (10-15 mg total) by mouth every 4 (four) hours as needed for severe pain. 2000 mL 0  . prochlorperazine (COMPAZINE) 10 MG tablet Take 1 tablet (10 mg total) by mouth every 6 (six) hours as needed for nausea or vomiting. 90 tablet 4  . ramipril (ALTACE) 5 MG capsule Take 2 capsules (10 mg total) by mouth daily. 30 capsule 5  . sodium fluoride (PREVIDENT 5000 PLUS) 1.1 % CREA dental cream Apply to tooth brush. Brush teeth for 2 minutes. Spit out excess-DO NOT swallow. Repeat nightly. 1 Tube prn  . tamsulosin (FLOMAX) 0.4 MG CAPS capsule Take 1 capsule (0.4 mg total) by mouth daily. 30 capsule 5  . DULoxetine (CYMBALTA) 20 MG capsule Take 1 capsule (20 mg total) by mouth daily. 30 capsule 0  . escitalopram (LEXAPRO) 5 MG tablet Take 1 tablet (5 mg total)  by mouth daily. 30 tablet 5   No current facility-administered medications for this visit.     Objective: BP 110/66 (BP Location: Left Arm, Patient Position: Sitting, Cuff Size: Large)   Pulse 66   Temp 97.7 F (36.5 C) (Oral)   Ht 5\' 4"  (1.626 m)   Wt 127 lb 3.2 oz (57.7 kg)   SpO2 92%   BMI 21.83 kg/m  Gen: NAD, resting comfortably CV: RRR no murmurs rubs or gallops Lungs: CTAB no crackles, wheeze, rhonchi Ext: no edema Skin: warm, dry Neuro: no leg weakness MSK: some muscle spasm in left low back and mildly tender to touch. Negative straight leg raise  Assessment/Plan:   Left low back pain S:2-3 weeks of left low backpain. Worse in the  morning. Better as the day goes on but is taking fentanyl. Chronic pain medicine. No pain into leg. No leg weakness. No bowel or bladder incontinence.  A/P: Suspect MSK strain. No red flags- do not think imbalance is from the back as source- could do PT for gait/balance training as well. For now from avs- Try 3x a day heat for 15 minutes for the low back. If no better in another 3 weeks could do some physical therapy. If worsens- we can refer you to orthopedics  Depression ([place]) S: sweaty with activity and off balance since going on the cymbalta. Never really helped with his pain. Had a fall a few weeks ago. Was on flomax- stopped it about a week ago after fall. Imbalance remains even off the flomax- not lightheaded  Prior zoloft with minimal help with depression.  A/P: Patient and wife firmly believe issues with balance are related to cymbalta. We will titrate onto lexapro 5mg  and off of cymbalta as per avs. Only up to 5mg  for now- follow up in 6 weeks and then consider titration to 10mg . He is also having anxiety issues- seems to not sleep as well and wife notes him being jittery at night- they strongly request ativan- I declined given also on narcotics. I discussed possibly seeing psychiatry for help with anxiety- they decline for now.   Hypothyroidism S: TSh was rechecked before intended but went into normal range before medication adjustment-patient decided to continue 25 mcg  A/P: I am fine with this plan- will recheck tsh in 6 weeks- can go up to 50 mcg if needed at that time. Initial TSH just a hair off anyway.   Essential hypertension S: controlled on ramipril 10mg .  BP Readings from Last 3 Encounters:  11/27/16 110/66  10/30/16 104/66  09/08/16 128/61  A/P: We discussed blood pressure goal of <140/90. Continue current meds but  Reduce ramipril from 10mg  to 5mg   Future Appointments Date Time Provider [place]  12/10/2016 10:00 AM [facility] LAB [facility] None  12/17/2016 1:40  PM Holley Bouche, NP [facility] None   6 weeks TSH check- would be reasonable to heck in- in person at that time as well to follow up on lexapro change. Sooner if needed.   Meds ordered this encounter  Medications  . DULoxetine (CYMBALTA) 20 MG capsule    Sig: Take 1 capsule (20 mg total) by mouth daily.    Dispense:  30 capsule    Refill:  0  . escitalopram (LEXAPRO) 5 MG tablet    Sig: Take 1 tablet (5 mg total) by mouth daily.    Dispense:  30 tablet    Refill:  5  . ramipril (ALTACE) 5 MG capsule  Sig: Take 2 capsules (10 mg total) by mouth daily.    Dispense:  30 capsule    Refill:  5    Return precautions advised.  Garret Reddish, MD

## 2016-11-28 NOTE — Assessment & Plan Note (Signed)
S: TSh was rechecked before intended but went into normal range before medication adjustment-patient decided to continue 25 mcg  A/P: I am fine with this plan- will recheck tsh in 6 weeks- can go up to 50 mcg if needed at that time. Initial TSH just a hair off anyway.

## 2016-11-28 NOTE — Assessment & Plan Note (Signed)
S: controlled on ramipril 10mg .  BP Readings from Last 3 Encounters:  11/27/16 110/66  10/30/16 104/66  09/08/16 128/61  A/P: We discussed blood pressure goal of <140/90. Continue current meds but  Reduce ramipril from 10mg  to 5mg 

## 2016-11-28 NOTE — Assessment & Plan Note (Signed)
S: sweaty with activity and off balance since going on the cymbalta. Never really helped with his pain. Had a fall a few weeks ago. Was on flomax- stopped it about a week ago after fall. Imbalance remains even off the flomax- not lightheaded  Prior zoloft with minimal help with depression.  A/P: Patient and wife firmly believe issues with balance are related to cymbalta. We will titrate onto lexapro 5mg  and off of cymbalta as per avs. Only up to 5mg  for now- follow up in 6 weeks and then consider titration to 10mg . He is also having anxiety issues- seems to not sleep as well and wife notes him being jittery at night- they strongly request ativan- I declined given also on narcotics. I discussed possibly seeing psychiatry for help with anxiety- they decline for now.

## 2016-12-04 ENCOUNTER — Telehealth (HOSPITAL_COMMUNITY): Payer: Self-pay | Admitting: *Deleted

## 2016-12-04 ENCOUNTER — Other Ambulatory Visit (HOSPITAL_COMMUNITY): Payer: Self-pay | Admitting: Oncology

## 2016-12-04 DIAGNOSIS — R070 Pain in throat: Secondary | ICD-10-CM

## 2016-12-04 DIAGNOSIS — C099 Malignant neoplasm of tonsil, unspecified: Secondary | ICD-10-CM

## 2016-12-04 MED ORDER — OXYCODONE HCL 5 MG/5ML PO SOLN: 10.0000 mg | ORAL | 0 refills | Status: DC | PRN

## 2016-12-04 MED ORDER — FENTANYL 50 MCG/HR TD PT72: 50.0000 ug | MEDICATED_PATCH | TRANSDERMAL | 0 refills | Status: DC

## 2016-12-10 ENCOUNTER — Other Ambulatory Visit: Payer: Medicare HMO

## 2016-12-10 MED FILL — fentaNYL 50 MCG/HR PT72: 50 | 30 days supply | Qty: 10 | Fill #0

## 2016-12-10 MED FILL — oxyCODONE HCL 5 MG/5ML SOLN: 5 | 22 days supply | Qty: 2000 | Fill #0

## 2016-12-17 ENCOUNTER — Encounter (HOSPITAL_COMMUNITY): Payer: Medicare HMO | Attending: Adult Health | Admitting: Adult Health

## 2016-12-17 ENCOUNTER — Encounter (HOSPITAL_COMMUNITY): Payer: Self-pay | Admitting: Adult Health

## 2016-12-17 DIAGNOSIS — R110 Nausea: Secondary | ICD-10-CM

## 2016-12-17 DIAGNOSIS — F418 Other specified anxiety disorders: Secondary | ICD-10-CM

## 2016-12-17 DIAGNOSIS — Z9181 History of falling: Secondary | ICD-10-CM | POA: Diagnosis not present

## 2016-12-17 DIAGNOSIS — E039 Hypothyroidism, unspecified: Secondary | ICD-10-CM

## 2016-12-17 DIAGNOSIS — R69 Illness, unspecified: Secondary | ICD-10-CM | POA: Diagnosis not present

## 2016-12-17 DIAGNOSIS — C099 Malignant neoplasm of tonsil, unspecified: Secondary | ICD-10-CM

## 2016-12-17 DIAGNOSIS — R070 Pain in throat: Secondary | ICD-10-CM

## 2016-12-17 DIAGNOSIS — Z23 Encounter for immunization: Secondary | ICD-10-CM | POA: Diagnosis not present

## 2016-12-17 DIAGNOSIS — G8929 Other chronic pain: Secondary | ICD-10-CM

## 2016-12-17 MED ORDER — INFLUENZA VAC SPLIT QUAD 0.5 ML IM SUSY
PREFILLED_SYRINGE | INTRAMUSCULAR | Status: AC
  Filled 2016-12-17: qty 0.5

## 2016-12-17 MED ORDER — OXYCODONE HCL 5 MG/5ML PO SOLN: 10.0000 mg | ORAL | 0 refills | Status: DC | PRN

## 2016-12-17 MED ORDER — FENTANYL 50 MCG/HR TD PT72: 50.0000 ug | MEDICATED_PATCH | TRANSDERMAL | 0 refills | Status: DC

## 2016-12-17 MED ORDER — INFLUENZA VAC SPLIT QUAD 0.5 ML IM SUSY
0.5000 mL | PREFILLED_SYRINGE | Freq: Once | INTRAMUSCULAR | Status: AC
  Administered 2016-12-17: 0.5 mL via INTRAMUSCULAR

## 2016-12-17 NOTE — Progress Notes (Signed)
Kevin Mcclain presents today for injection per MD orders. Fluarix administered IM in right deltoid. Administration without incident. Patient tolerated well. Patient tolerated treatment without incidence. Patient discharged ambulatory and in stable condition from clinic. Patient to follow up as scheduled.

## 2016-12-17 NOTE — Patient Instructions (Addendum)
[place] at [facility] Discharge Instructions  RECOMMENDATIONS MADE BY THE CONSULTANT AND ANY TEST RESULTS WILL BE SENT TO YOUR REFERRING PHYSICIAN.  You were seen today by [name], NP We have given you your flu shot today We have also given you a few months worth of your medication refills.   Follow up in 6 months See schedulers up front for appointments   Thank you for choosing [place] at [facility] to provide your oncology and hematology care.  To afford each patient quality time with our provider, please arrive at least 15 minutes before your scheduled appointment time.    If you have a lab appointment with the [place] please come in thru the  Main Entrance and check in at the main information desk  You need to re-schedule your appointment should you arrive 10 or more minutes late.  We strive to give you quality time with our providers, and arriving late affects you and other patients whose appointments are after yours.  Also, if you no show three or more times for appointments you may be dismissed from the clinic at the providers discretion.     Again, thank you for choosing [facility].  Our hope is that these requests will decrease the amount of time that you wait before being seen by our physicians.       _____________________________________________________________  Should you have questions after your visit to [facility], please contact our office at [phone] between the hours of 8:30 a.m. and 4:30 p.m.  Voicemails left after 4:30 p.m. will not be returned until the following business day.  For prescription refill requests, have your pharmacy contact our office.       Resources For Cancer Patients and their Caregivers ? American Cancer Society: Can assist with transportation, wigs, general needs, runs Look Good Feel Better.        919-585-2103 ? Cancer Care: Provides  financial assistance, online support groups, medication/co-pay assistance.  1-800-813-HOPE 437-645-6127) ? [place] Assists [place] cancer patients and their families through emotional , educational and financial support.  (434)227-2077 ? [place] DSS Where to apply for food stamps, Medicaid and utility assistance. 859-286-1735 ? [facility]: Transportation to medical appointments. 443 333 4431 ? Social Security Administration: May apply for disability if have a Stage IV cancer. 4582695034 408-744-8531 ? [employer], Disability and Transit Services: Assists with nutrition, care and transit needs. [place] Support Programs: @10RELATIVEDAYS @ > Cancer Support Group  2nd Tuesday of the month 1pm-2pm, Journey Room  > Creative Journey  3rd Tuesday of the month 1130am-1pm, Journey Room  > Look Good Feel Better  1st Wednesday of the month 10am-12 noon, Journey Room (Call [place] to register [phone])

## 2016-12-27 NOTE — Progress Notes (Signed)
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      10/12/2014 Pathology Results    Accession: [id number]  right tonsil biopsy confirmed squamous cell carcinoma, HPV positive      10/12/2014 Procedure     he underwent biopsy of right tonsil      10/29/2014 Imaging     PET/CT scan showed 3.2 cm hypermetabolic mass in the right palatine tonsil, consistent with known tonsillar carcinoma.       11/06/2014 Imaging    CT neck: Right palatine tonsil mass measuring 2.1 x 2.1 x 2.5 cm.  Asymmetric right LNs in level 1B, 2, and level 3.       12/03/2014 - 01/18/2015 Radiation Therapy    Right tonsil and bilateral neck / 70 Gy in 35 fractions to gross disease, 63 Gy in 35 fractions to high risk nodal echelons, and 56 Gy in 35 fractions to intermediate risk nodal echelons      01/06/2015 Miscellaneous    ER VISIT for dysphagia.  No hospital admission. CT neck done and pt was able to tolerate po liquids afer IV zofran. Discharged home.       01/06/2015 Imaging    CT neck: IMPRESSION: Significant interval treatment response with no residual measurable right tonsil mass. Mild mucosal thickening and hyperenhancement in bilateral oropharynx & epiglottis, likely tx effect. No cervical adenpathy.      04/25/2015 Procedure    PEG placement       05/16/2015 PET scan    Restaging PET scan: Interval response to therapy. Resolution  of hypermetabolism associated with right tonsillar lesion. No evidence of residual or recurrence of tumor or metastatic disease.       03/23/2016 Imaging    CT neck for persistent pain: IMPRESSION: Acute RIGHT submandibular sialoadenitis, RIGHT neck inflammation and pharyngitis. Findings may be secondary to radiation or, infectious.  No convincing evidence of residual nor recurrent tumor.      03/23/2016 Imaging    CT chest/abd for cough/N&V: IMPRESSION: 1. No findings of metastatic disease to the chest or upper abdomen. 2. 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 Recommend annual imaging followup by CTA or MRA. This recommendation follows 2010 ACCF/AHA/AATS/ACR/ASA/SCA/SCAI/SIR/STS/SVM Guidelines for the Diagnosis and Management of Patients with Thoracic Aortic Disease. Circulation. 2010; 121: H212-Y482 3. Other imaging findings of potential clinical significance: Pneumobilia, query prior sphincterotomy. Coronary, aortic arch, and branch vessel atherosclerotic vascular disease. Aortoiliac atherosclerotic vascular disease. Foraminal impingement at L4-5 and L5-S1 due to spondylosis and degenerative disc disease. Bilateral renal cysts.      06/25/2016 Procedure    PEG tube removed with IR.         INTERVAL HISTORY:  [name] 71 y.o. male presents to [facility] after transferring his medical oncology care here since this provider's recent relocation to this office.  (He is my  previous patient treated in [place] at [facility]).  He is here for routine follow-up for h/o Stage II right tonsil cancer.   Overall, he tells me that he has been feeling okay. Appetite and energy levels both 50%.  His weight has been stable.  He continues to struggle with chronic throat pain.  Also has neck and back pain. Remains on 50 mcg Fentanyl patch with PRN oxycodone solution.  Both are helpful in controlling his pain; "I still have some bad days where I have to take more  medicine, but my bad days are less often than my good days."    His PCP has been working with him on his depression/anxiety.  He was recently started on Cymbalta, but they are working on weaning him off of this medication and starting Lexapro instead.  He was having intolerable side effects on Cymbalta including gait instability and recent falls; his last fall was 2-3 weeks ago.  He continues to have intermittent nausea; the anti-emetics PRN are helpful.    He reports tinnitus; "it sounds like a high-pitched transformer sound or ringing bells in my ears."  He last saw [name], his ENT physician, in 06/2016. Per patient, "he did a scope through my nose and said everything looked okay from a cancer standpoint, just still really inflamed and scarred."  [name] thinks that he may have had nerve damage with the radiation therapy that has now caused the tinnitus.    He has not had his flu vaccine yet for this season.    REVIEW OF SYSTEMS:  Review of Systems  Constitutional: Positive for fatigue. Negative for chills and fever.  HENT:   Positive for sore throat and tinnitus.        Chronic xerostomia   Eyes: Negative.   Respiratory: Negative.   Cardiovascular: Negative.   Gastrointestinal: Positive for nausea. Negative for vomiting.  Endocrine: Negative.   Genitourinary: Negative.    Musculoskeletal: Positive for back pain, gait problem and neck pain.  Neurological: Positive for gait problem.  Hematological: Negative.   Psychiatric/Behavioral: Positive for depression. The patient is nervous/anxious.      PAST MEDICAL/SURGICAL HISTORY:  Past Medical History:  Diagnosis Date  . Allergy   . Arthritis   . CAD (coronary artery disease)    severe CAD-s/p MI 2000 with PTCA, s/p CABG 2005-by Dr Vanetta Mulders at [facility] EF 50-55%  . Cancer ([place]) 10/12/2014   right tonsil=squamous cell  carcinaoma  . Diabetes mellitus type II   . GERD (gastroesophageal reflux disease)    zantac- prn  .  Gout   . History of radiation therapy 12/03/14- 01/18/15   Right Tonsil and Bilateral neck.   . Hyperlipidemia   . Hypertension   . Kidney stones   . Shingles outbreak 04/23/15   left shoulder and left arm  . Vertigo    Past Surgical History:  Procedure Laterality Date  . CHOLECYSTECTOMY  2012  . COLONOSCOPY W/ POLYPECTOMY  2015  . CORONARY ARTERY BYPASS GRAFT  2005   6 grafts  . CYSTOSCOPY     kidney stone removal  . IR GASTROSTOMY TUBE REMOVAL  06/25/2016  . LITHOTRIPSY    . MULTIPLE EXTRACTIONS WITH ALVEOLOPLASTY N/A 11/14/2014   Procedure: Extraction of tooth #2 and 31 with alveoloplasty after sectioning of bridge at distal #29 with full mouth debridement of remaining dention;  Surgeon: Lenn Cal, DDS;  Location: [facility];  Service: Oral Surgery;  Laterality: N/A;  . NASAL  SINUS SURGERY Right 11/14/2014   Procedure: RIGHT ENDOSCOPIC ANTROSTOMY AND ANTERIOR ETHMOIDECTOMY ;  Surgeon: Jodi Marble, MD;  Location: [facility];  Service: ENT;  Laterality: Right;     SOCIAL HISTORY:  Social History   Social History  . Marital status: Married    Spouse name: N/A  . Number of children: N/A  . Years of education: N/A   Occupational History  . Not on file.   Social History Main Topics  . Smoking status: Never Smoker  . Smokeless tobacco: Never Used  . Alcohol use No  . Drug use: No  . Sexual activity: Not on file   Other Topics Concern  . Not on file   Social History Narrative   Married 35 years in October 2017.       Occupation :  Retired Arboriculturist for [employer].  Worked for [employer]: garden and work in yard    FAMILY HISTORY:  Family History  Problem Relation Age of Onset  . Heart attack Mother        deceased age 38 secondary  . Colon cancer Father   . Stroke Father        deceased age 106  . Heart disease Father        heart attack  . Stroke Sister 22  . Coronary artery disease Brother 23       s/p bypass surgery   .  Coronary artery disease Brother 85       stent    CURRENT MEDICATIONS:  Outpatient Encounter Prescriptions as of 12/17/2016  Medication Sig  . aspirin 81 MG tablet Take 81 mg by mouth daily. Does not take everyday  . atorvastatin (LIPITOR) 10 MG tablet Place 1 tablet (10 mg total) into feeding tube daily.  . DULoxetine (CYMBALTA) 20 MG capsule Take 1 capsule (20 mg total) by mouth daily.  Marland Kitchen escitalopram (LEXAPRO) 5 MG tablet Take 1 tablet (5 mg total) by mouth daily.  . fentaNYL (DURAGESIC - DOSED MCG/HR) 50 MCG/HR Place 1 patch (50 mcg total) onto the skin every 3 (three) days.  Marland Kitchen levothyroxine (SYNTHROID, LEVOTHROID) 50 MCG tablet Take 1 tablet (50 mcg total) by mouth daily.  . metFORMIN (GLUCOPHAGE) 500 MG tablet Take 1 tablet (500 mg total) by mouth daily with breakfast.  . metoprolol succinate (TOPROL-XL) 25 MG 24 hr tablet TAKE 1 TABLET (25 MG TOTAL) BY MOUTH DAILY. TAKE WITH OR IMMEDIATELY FOLLOWING A MEAL.  . nitroGLYCERIN (NITROSTAT) 0.4 MG SL tablet Place 1 tablet (0.4 mg total) under the tongue every 5 (five) minutes as needed.  . ondansetron (ZOFRAN) 4 MG tablet TAKE 1 TABLET (4 MG TOTAL) BY MOUTH EVERY 8 (EIGHT) HOURS AS NEEDED FOR NAUSEA OR VOMITING.  . OVER THE COUNTER MEDICATION Place 1 drop into both eyes 3 (three) times daily as needed (dry eyes/ irritation). Clear Cooling Eye Drops  . oxyCODONE (ROXICODONE) 5 MG/5ML solution Take 10-15 mLs (10-15 mg total) by mouth every 4 (four) hours as needed for severe pain.  Marland Kitchen prochlorperazine (COMPAZINE) 10 MG tablet Take 1 tablet (10 mg total) by mouth every 6 (six) hours as needed for nausea or vomiting.  . ramipril (ALTACE) 5 MG capsule Take 2 capsules (10 mg total) by mouth daily.  . sodium fluoride (PREVIDENT 5000 PLUS) 1.1 % CREA dental cream Apply to tooth brush. Brush teeth for 2 minutes. Spit out excess-DO NOT swallow. Repeat nightly.  . tamsulosin (FLOMAX) 0.4 MG  CAPS capsule Take 1 capsule (0.4 mg total) by mouth daily.  .  [DISCONTINUED] fentaNYL (DURAGESIC - DOSED MCG/HR) 50 MCG/HR Place 1 patch (50 mcg total) onto the skin every 3 (three) days.  . [DISCONTINUED] fentaNYL (DURAGESIC - DOSED MCG/HR) 50 MCG/HR Place 1 patch (50 mcg total) onto the skin every 3 (three) days.  . [DISCONTINUED] fentaNYL (DURAGESIC - DOSED MCG/HR) 50 MCG/HR Place 1 patch (50 mcg total) onto the skin every 3 (three) days.  . [DISCONTINUED] oxyCODONE (ROXICODONE) 5 MG/5ML solution Take 10-15 mLs (10-15 mg total) by mouth every 4 (four) hours as needed for severe pain.  . [DISCONTINUED] oxyCODONE (ROXICODONE) 5 MG/5ML solution Take 10-15 mLs (10-15 mg total) by mouth every 4 (four) hours as needed for severe pain.  . [DISCONTINUED] oxyCODONE (ROXICODONE) 5 MG/5ML solution Take 10-15 mLs (10-15 mg total) by mouth every 4 (four) hours as needed for severe pain.  . [EXPIRED] Influenza vac split quadrivalent PF (FLUARIX) injection 0.5 mL    No facility-administered encounter medications on file as of 12/17/2016.     ALLERGIES:  Allergies  Allergen Reactions  . Benzonatate Swelling    "swells my throat"  . Codeine Itching  . Sitagliptin Phosphate Itching     PHYSICAL EXAM:  ECOG Performance status: 1 - Symptomatic, but independent  Vitals:   12/17/16 1420  BP: (!) 143/65  Pulse: (!) 56  Resp: 18  Temp: 97.8 F (36.6 C)  SpO2: 100%   Filed Weights   12/17/16 1420  Weight: 132 lb 9.6 oz (60.1 kg)    Physical Exam  Constitutional: He is oriented to person, place, and time.  Thin male in no acute distress   HENT:  Head: Normocephalic.  Mouth/Throat: Oropharynx is clear and moist. No oropharyngeal exudate.  Posterior oropharynx with mild erythema and telangectasia secondary to XRT.   Eyes: Pupils are equal, round, and reactive to light. Conjunctivae are normal. No scleral icterus.  Neck: Normal range of motion.  Anterior neck fibrosis s/p XRT. Mild submental lymphedema. No palpable masses or nodularity.   Cardiovascular:  Normal rate and regular rhythm.   Pulmonary/Chest: Effort normal and breath sounds normal. No respiratory distress. He has no wheezes.  Abdominal: Soft. Bowel sounds are normal. There is no tenderness.  Musculoskeletal: Normal range of motion. He exhibits no edema.  Lymphadenopathy:    He has no cervical adenopathy.  Neurological: He is alert and oriented to person, place, and time. No cranial nerve deficit. Gait normal.  Skin: Skin is warm and dry. No rash noted.  Psychiatric: Memory and judgment normal.  Slightly anxious affect.   Nursing note and vitals reviewed.    LABORATORY DATA:  I have reviewed the labs as listed.  CBC    Component Value Date/Time   WBC 5.0 10/30/2016 1353   RBC 4.57 10/30/2016 1353   HGB 13.8 10/30/2016 1353   HGB 13.3 04/23/2015 1102   HCT 40.7 10/30/2016 1353   HCT 37.6 (L) 04/23/2015 1102   PLT 222.0 10/30/2016 1353   PLT 163 04/23/2015 1102   MCV 89.0 10/30/2016 1353   MCV 89.3 04/23/2015 1102   MCH 31.1 04/25/2015 1138   MCHC 33.9 10/30/2016 1353   RDW 14.0 10/30/2016 1353   RDW 13.2 04/23/2015 1102   LYMPHSABS 0.4 (L) 04/23/2015 1102   MONOABS 0.6 04/23/2015 1102   EOSABS 0.1 04/23/2015 1102   BASOSABS 0.0 04/23/2015 1102   CMP Latest Ref Rng & Units 10/30/2016 06/29/2016 03/23/2016  Glucose 70 - 99  mg/dL 104(H) 88 -  BUN 6 - 23 mg/dL 9 10 -  Creatinine 0.40 - 1.50 mg/dL 0.91 0.82 0.80  Sodium 135 - 145 mEq/L 134(L) 139 -  Potassium 3.5 - 5.1 mEq/L 4.6 4.0 -  Chloride 96 - 112 mEq/L 97 102 -  CO2 19 - 32 mEq/L 31 31 -  Calcium 8.4 - 10.5 mg/dL 9.4 9.4 -  Total Protein 6.0 - 8.3 g/dL 6.2 6.4 -  Total Bilirubin 0.2 - 1.2 mg/dL 0.7 0.6 -  Alkaline Phos 39 - 117 U/L 65 73 -  AST 0 - 37 U/L 22 21 -  ALT 0 - 53 U/L 20 18 -    PENDING LABS:    DIAGNOSTIC IMAGING:  CT neck: 03/23/16     PATHOLOGY:  (R) tonsil biopsy: 10/12/14     ASSESSMENT & PLAN:   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 s/p definitive treatment with radiation alone; completed treatment on 01/18/15.  -Remains without clinical evidence of recurrence, which is excellent. His risk of recurrence at this point is much lower.  Last saw [name] in 07/2701 with reported no recurrence on his exam.  Recommend continued follow-up visits with [name] as he deems appropriate.  -Return to cancer center in 6 months for follow-up.   Hypothyroidism:  -Last TSH was normal at 3.05 on 10/30/16. Currently on Synthroid 50 mcg daily. His hypothyroidism is managed by his PCP.  -Maintain follow-up with PCP as directed.   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  -Current pain regimen with 50 mcg Fentanyl patch and Oxycodone solution PRN has been adequate. Historically, he did not tolerate gabapentin well during acute recovery from radiation.  He has also tried oxycodone tablets in the past, which caused N&V.  -Continue Fentanyl patch and Oxycodone solution at current doses.  He is requesting several months' supply of his pain medications d/t past issues with Rxs being ready for pick-up when I am not in the office. This is not unreasonable. [place] Controlled Substance Reporting System reviewed and refills will be appropriate beginning in 12/2016.   I will give him several prescriptions to last him ~3 months.  The following paper prescriptions provided today. He will contact our office when he needs additional refills in ~3 months.   *Fentanyl patch 50 mcg transdermal Q72H, #10 (for 12/2016)  *Fentanyl patch 50 mcg transdermal Q72H, #10 (for 01/2017)  *Fentanyl patch 50 mcg transdermal Q72H, #10 (for 03/2017)  *Oxycodone soln 5mg /53mL, take 10-15mg   po Q4Hprn, #2072mL (for 12/2016)  *Oxycodone soln 5mg /79mL, take 10-15mg   po Q4Hprn, #2028mL (for 01/2017)  Oxycodone soln 5mg /35mL, take 10-15mg   po Q4Hprn, #2083mL (for 03/2017)   Depression and anxiety:  -Was started on Cymbalta, but  did not tolerate well.  His depression and anxiety are currently being managed by his PCP.  They are currently working on weaning him off of Cymbalta and he has started Lexapro.   -Provided support and reassurance today. I am hopeful Lexapro will provide more optimal control of his depression and anxiety symptoms, which I think are also contributing to his pain.  He is hypervigilant about his symptoms, which is likely secondary to an element of PTSD s/p cancer diagnosis and subsequent treatment.     Health maintenance:  -Annual flu vaccine given today by nursing.       Dispo:  -Continue to see [name] (ENT) as directed.  -Return to  cancer center in 6 months for continued follow-up.    All questions were answered to patient's stated satisfaction. Encouraged patient to call with any new concerns or questions before his next visit to the cancer center and we can certain see him sooner, if needed.    Plan of care discussed with Dr. Talbert Cage, who agrees with the above aforementioned.    Orders placed this encounter:  No orders of the defined types were placed in this encounter.     Kevin Craze, NP [place] [phone]

## 2017-01-01 MED FILL — oxyCODONE HCL 5 MG/5ML SOLN: 5 | 22 days supply | Qty: 2000 | Fill #0

## 2017-01-05 DIAGNOSIS — Z85818 Personal history of malignant neoplasm of other sites of lip, oral cavity, and pharynx: Secondary | ICD-10-CM | POA: Diagnosis not present

## 2017-01-07 ENCOUNTER — Other Ambulatory Visit: Payer: Self-pay

## 2017-01-07 MED ORDER — DULOXETINE HCL 20 MG PO CPEP: 20.0000 mg | ORAL_CAPSULE | Freq: Every day | ORAL | 0 refills | Status: DC

## 2017-01-08 MED FILL — fentaNYL 50 MCG/HR PT72: 50 | 30 days supply | Qty: 10 | Fill #0

## 2017-01-11 ENCOUNTER — Ambulatory Visit: Payer: Medicare HMO | Admitting: Family Medicine

## 2017-01-13 ENCOUNTER — Other Ambulatory Visit: Payer: Self-pay

## 2017-01-13 MED ORDER — METOPROLOL SUCCINATE ER 25 MG PO TB24: 25.0000 mg | ORAL_TABLET | Freq: Every day | ORAL | 1 refills | Status: DC

## 2017-01-15 ENCOUNTER — Ambulatory Visit (INDEPENDENT_AMBULATORY_CARE_PROVIDER_SITE_OTHER): Payer: Medicare HMO | Admitting: Family Medicine

## 2017-01-15 ENCOUNTER — Encounter: Payer: Self-pay | Admitting: Family Medicine

## 2017-01-15 VITALS — BP 132/78 | HR 51 | Temp 98.3°F | Ht 64.0 in | Wt 131.2 lb

## 2017-01-15 DIAGNOSIS — E538 Deficiency of other specified B group vitamins: Secondary | ICD-10-CM

## 2017-01-15 DIAGNOSIS — F320 Major depressive disorder, single episode, mild: Secondary | ICD-10-CM

## 2017-01-15 DIAGNOSIS — R69 Illness, unspecified: Secondary | ICD-10-CM | POA: Diagnosis not present

## 2017-01-15 DIAGNOSIS — I10 Essential (primary) hypertension: Secondary | ICD-10-CM

## 2017-01-15 DIAGNOSIS — E038 Other specified hypothyroidism: Secondary | ICD-10-CM

## 2017-01-15 LAB — VITAMIN B12

## 2017-01-15 LAB — TSH: TSH: 3.43 u[IU]/mL (ref 0.35–4.50)

## 2017-01-15 MED ORDER — RAMIPRIL 5 MG PO CAPS: ORAL_CAPSULE | ORAL | 3 refills | Status: DC

## 2017-01-15 MED ORDER — ESCITALOPRAM OXALATE 10 MG PO TABS: 10.0000 mg | ORAL_TABLET | Freq: Every day | ORAL | 5 refills | Status: DC

## 2017-01-15 MED ORDER — METOPROLOL SUCCINATE ER 25 MG PO TB24: 25.0000 mg | ORAL_TABLET | Freq: Every day | ORAL | 3 refills | Status: DC

## 2017-01-15 NOTE — Patient Instructions (Addendum)
If you can find shingrix- get it!   Increase lexapro to 10mg   Continue ramipril 10mg  in morning and 5 mg in evening- seems you have found the right combo  Please stop by lab before you go

## 2017-01-15 NOTE — Progress Notes (Signed)
Subjective:  Kevin Mcclain is a 71 y.o. year old very pleasant male patient who presents for/with See problem oriented charting ROS- depressed mood and anhedonia but no SI. Balance is much improved. No chest pain. No reported shortness of breath   Past Medical History-  Patient Active Problem List   Diagnosis Date Noted  . Hypothyroidism 09/11/2016    Priority: High  . Ascending aortic aneurysm ([place]) 03/25/2016    Priority: High  . Cancer of tonsillar fossa ([place]) 10/31/2014    Priority: High  . Osteoarthritis 11/18/2010    Priority: High  . CAD (coronary artery disease) 10/11/2008    Priority: High  . Type 2 diabetes mellitus with peripheral neuropathy ([place]) 01/24/2007    Priority: High  . Depression ([place]) 04/02/2016    Priority: Medium  . Diabetic neuropathy ([place]) 12/26/2015    Priority: Medium  . Gout 03/28/2007    Priority: Medium  . HYPERLIPIDEMIA 01/24/2007    Priority: Medium  . Essential hypertension 01/24/2007    Priority: Medium  . B12 deficiency 01/15/2017    Priority: Low  . G tube feedings ([place]) 06/10/2015    Priority: Low  . GERD (gastroesophageal reflux disease) 06/10/2015    Priority: Low  . Arthropathy 03/10/2013    Priority: Low  . Low back pain 11/26/2011    Priority: Low  . Kidney stone 11/04/2011    Priority: Low  . BPH associated with nocturia 10/30/2016  . Herpes zoster 07/15/2015    Medications- reviewed and updated Current Outpatient Medications  Medication Sig Dispense Refill  . aspirin 81 MG tablet Take 81 mg by mouth daily. Does not take everyday    . atorvastatin (LIPITOR) 10 MG tablet Place 1 tablet (10 mg total) into feeding tube daily. 90 tablet 3  . escitalopram (LEXAPRO) 5 MG tablet Take 1 tablet (5 mg total) by mouth daily. 30 tablet 5  . fentaNYL (DURAGESIC - DOSED MCG/HR) 50 MCG/HR Place 1 patch (50 mcg total) onto the skin every 3 (three) days. 10 patch 0  . levothyroxine (SYNTHROID, LEVOTHROID) 50 MCG tablet Take 1 tablet  (50 mcg total) by mouth daily. 90 tablet 3  . metFORMIN (GLUCOPHAGE) 500 MG tablet Take 1 tablet (500 mg total) by mouth daily with breakfast. 90 tablet 3  . metoprolol succinate (TOPROL-XL) 25 MG 24 hr tablet Take 1 tablet (25 mg total) daily by mouth. Take with or immediately following a meal. 90 tablet 1  . nitroGLYCERIN (NITROSTAT) 0.4 MG SL tablet Place 1 tablet (0.4 mg total) under the tongue every 5 (five) minutes as needed. 50 tablet 3  . ondansetron (ZOFRAN) 4 MG tablet TAKE 1 TABLET (4 MG TOTAL) BY MOUTH EVERY 8 (EIGHT) HOURS AS NEEDED FOR NAUSEA OR VOMITING.  5  . OVER THE COUNTER MEDICATION Place 1 drop into both eyes 3 (three) times daily as needed (dry eyes/ irritation). Clear Cooling Eye Drops    . oxyCODONE (ROXICODONE) 5 MG/5ML solution Take 10-15 mLs (10-15 mg total) by mouth every 4 (four) hours as needed for severe pain. 2000 mL 0  . prochlorperazine (COMPAZINE) 10 MG tablet Take 1 tablet (10 mg total) by mouth every 6 (six) hours as needed for nausea or vomiting. 90 tablet 4  . ramipril (ALTACE) 5 MG capsule Take 2 capsules (10 mg total) by mouth daily. 30 capsule 5  . sodium fluoride (PREVIDENT 5000 PLUS) 1.1 % CREA dental cream Apply to tooth brush. Brush teeth for 2 minutes. Spit out excess-DO NOT swallow.  Repeat nightly. 1 Tube prn   Objective: BP 132/78 (BP Location: Left Arm, Patient Position: Sitting, Cuff Size: Normal)   Pulse (!) 51   Temp 98.3 F (36.8 C) (Oral)   Ht 5\' 4"  (1.626 m)   Wt 131 lb 3.2 oz (59.5 kg)   SpO2 96%   BMI 22.52 kg/m  Gen: NAD, resting comfortably  Assessment/Plan:  Prior left low back pain resolved. Balance issues drastically improved off cymbalta- declines gait and balance PT referral as previously discussed.   Ask me to check vitamin D- he is takign 2k units a day- may not be covered by insurance without deficiency history and they decline  Essential hypertension S: controlled on ramipril 10mg  in Am and 5 mg in PM.also on  metoprolol BP Readings from Last 3 Encounters:  01/15/17 132/78  12/17/16 (!) 143/65  11/27/16 110/66  A/P: We discussed blood pressure goal of <140/90. Continue current meds  Hypothyroidism S: on levothyroxine 25 mcg. On last check ok- had been off on prior check Lab Results  Component Value Date   TSH 3.05 10/30/2016  A/P: update tsh and increase dose levothyroxine to 50 mcg if TSH elevated.   Depression ([place]) S:  Patient and wife were concerned balance issues were related to cymbalta- sweaty with activity and off balance and stated cymbalta never really helped with pain. Had been taken off of flomax due to fall week prior- was off balance even without the flomax though We started on leapro 5mg  as titrated him off cymbalta. Balance issues much better off cymbalta. Family also noted him being jittery at night and requested ativan- I declined given also on narcotics. They declined psychiatry visit.   Today, reports depression persists but balance issues resolved off cymbalta. Did not complain of jittery issues at night either A/P: PHQ9 of 15. No SI. Will titrate lexapro to 10mg  though that may be max dose at his age. Follow up 2 months to allow them to get through holidays  B12 deficiency History of deficiency in past. They request updated check since drawing blood- higher risk on metformin.    Future Appointments  Date Time Provider [place]  03/17/2017 11:15 AM Marin Olp, MD [facility] None  06/17/2017  1:10 PM [facility] None  06/17/2017  2:00 PM [facility] COVERING PROVIDER [facility] None    Orders Placed This Encounter  Procedures  . TSH    [place]  . Vitamin B12    Meds ordered this encounter  Medications  . ramipril (ALTACE) 5 MG capsule    Sig: Take 2 pills in the morning, Take 1 pill in the evening    Dispense:  135 capsule    Refill:  3  . metoprolol succinate (TOPROL-XL) 25 MG 24 hr tablet    Sig: Take 1 tablet (25 mg total) daily by mouth.  Take with or immediately following a meal.    Dispense:  90 tablet    Refill:  3  . escitalopram (LEXAPRO) 10 MG tablet    Sig: Take 1 tablet (10 mg total) daily by mouth.    Dispense:  30 tablet    Refill:  5    Return precautions advised.  Garret Reddish, MD

## 2017-01-15 NOTE — Assessment & Plan Note (Signed)
S:  Patient and wife were concerned balance issues were related to cymbalta- sweaty with activity and off balance and stated cymbalta never really helped with pain. Had been taken off of flomax due to fall week prior- was off balance even without the flomax though We started on leapro 5mg  as titrated him off cymbalta. Balance issues much better off cymbalta. Family also noted him being jittery at night and requested ativan- I declined given also on narcotics. They declined psychiatry visit.   Today, reports depression persists but balance issues resolved off cymbalta. Did not complain of jittery issues at night either A/P: PHQ9 of 15. No SI. Will titrate lexapro to 10mg  though that may be max dose at his age. Follow up 2 months to allow them to get through holidays

## 2017-01-15 NOTE — Assessment & Plan Note (Signed)
S: controlled on ramipril 10mg  in Am and 5 mg in PM.also on metoprolol BP Readings from Last 3 Encounters:  01/15/17 132/78  12/17/16 (!) 143/65  11/27/16 110/66  A/P: We discussed blood pressure goal of <140/90. Continue current meds

## 2017-01-15 NOTE — Assessment & Plan Note (Signed)
S: on levothyroxine 25 mcg. On last check ok- had been off on prior check Lab Results  Component Value Date   TSH 3.05 10/30/2016  A/P: update tsh and increase dose levothyroxine to 50 mcg if TSH elevated.

## 2017-01-15 NOTE — Assessment & Plan Note (Signed)
History of deficiency in past. They request updated check since drawing blood- higher risk on metformin.

## 2017-01-27 ENCOUNTER — Other Ambulatory Visit: Payer: Self-pay

## 2017-01-27 MED ORDER — ESCITALOPRAM OXALATE 10 MG PO TABS: 10.0000 mg | ORAL_TABLET | Freq: Every day | ORAL | 1 refills | Status: DC

## 2017-02-01 MED FILL — oxyCODONE HCL 5 MG/5ML SOLN: 5 | 22 days supply | Qty: 2000 | Fill #0

## 2017-02-05 MED FILL — fentaNYL 50 MCG/HR PT72: 50 | 30 days supply | Qty: 10 | Fill #0

## 2017-03-03 MED FILL — oxyCODONE HCL 5 MG/5ML SOLN: 5 | 22 days supply | Qty: 2000 | Fill #0

## 2017-03-09 MED FILL — fentaNYL 50 MCG/HR PT72: 50 | 30 days supply | Qty: 10 | Fill #0

## 2017-03-15 ENCOUNTER — Other Ambulatory Visit (HOSPITAL_COMMUNITY): Payer: Self-pay | Admitting: *Deleted

## 2017-03-15 DIAGNOSIS — C099 Malignant neoplasm of tonsil, unspecified: Secondary | ICD-10-CM

## 2017-03-16 ENCOUNTER — Other Ambulatory Visit: Payer: Self-pay

## 2017-03-16 ENCOUNTER — Inpatient Hospital Stay (HOSPITAL_COMMUNITY): Payer: Medicare HMO

## 2017-03-16 ENCOUNTER — Inpatient Hospital Stay (HOSPITAL_COMMUNITY): Payer: Medicare HMO | Attending: Adult Health | Admitting: Adult Health

## 2017-03-16 ENCOUNTER — Telehealth: Payer: Self-pay | Admitting: Family Medicine

## 2017-03-16 ENCOUNTER — Encounter (HOSPITAL_COMMUNITY): Payer: Self-pay | Admitting: Lab

## 2017-03-16 ENCOUNTER — Encounter (HOSPITAL_COMMUNITY): Payer: Self-pay | Admitting: Adult Health

## 2017-03-16 DIAGNOSIS — F419 Anxiety disorder, unspecified: Secondary | ICD-10-CM

## 2017-03-16 DIAGNOSIS — H9319 Tinnitus, unspecified ear: Secondary | ICD-10-CM | POA: Diagnosis not present

## 2017-03-16 DIAGNOSIS — M109 Gout, unspecified: Secondary | ICD-10-CM | POA: Insufficient documentation

## 2017-03-16 DIAGNOSIS — R5383 Other fatigue: Secondary | ICD-10-CM | POA: Diagnosis not present

## 2017-03-16 DIAGNOSIS — E039 Hypothyroidism, unspecified: Secondary | ICD-10-CM

## 2017-03-16 DIAGNOSIS — Z9049 Acquired absence of other specified parts of digestive tract: Secondary | ICD-10-CM | POA: Insufficient documentation

## 2017-03-16 DIAGNOSIS — R4584 Anhedonia: Secondary | ICD-10-CM | POA: Diagnosis not present

## 2017-03-16 DIAGNOSIS — R070 Pain in throat: Secondary | ICD-10-CM

## 2017-03-16 DIAGNOSIS — Z7982 Long term (current) use of aspirin: Secondary | ICD-10-CM | POA: Diagnosis not present

## 2017-03-16 DIAGNOSIS — M5136 Other intervertebral disc degeneration, lumbar region: Secondary | ICD-10-CM | POA: Diagnosis not present

## 2017-03-16 DIAGNOSIS — Z79899 Other long term (current) drug therapy: Secondary | ICD-10-CM | POA: Diagnosis not present

## 2017-03-16 DIAGNOSIS — E785 Hyperlipidemia, unspecified: Secondary | ICD-10-CM | POA: Diagnosis not present

## 2017-03-16 DIAGNOSIS — C099 Malignant neoplasm of tonsil, unspecified: Secondary | ICD-10-CM

## 2017-03-16 DIAGNOSIS — I2510 Atherosclerotic heart disease of native coronary artery without angina pectoris: Secondary | ICD-10-CM | POA: Diagnosis not present

## 2017-03-16 DIAGNOSIS — K219 Gastro-esophageal reflux disease without esophagitis: Secondary | ICD-10-CM | POA: Diagnosis not present

## 2017-03-16 DIAGNOSIS — R42 Dizziness and giddiness: Secondary | ICD-10-CM

## 2017-03-16 DIAGNOSIS — N281 Cyst of kidney, acquired: Secondary | ICD-10-CM

## 2017-03-16 DIAGNOSIS — E119 Type 2 diabetes mellitus without complications: Secondary | ICD-10-CM | POA: Diagnosis not present

## 2017-03-16 DIAGNOSIS — Z87442 Personal history of urinary calculi: Secondary | ICD-10-CM

## 2017-03-16 DIAGNOSIS — R1310 Dysphagia, unspecified: Secondary | ICD-10-CM

## 2017-03-16 DIAGNOSIS — I700 Atherosclerosis of aorta: Secondary | ICD-10-CM

## 2017-03-16 DIAGNOSIS — R110 Nausea: Secondary | ICD-10-CM | POA: Diagnosis not present

## 2017-03-16 DIAGNOSIS — R69 Illness, unspecified: Secondary | ICD-10-CM | POA: Diagnosis not present

## 2017-03-16 DIAGNOSIS — Z85819 Personal history of malignant neoplasm of unspecified site of lip, oral cavity, and pharynx: Secondary | ICD-10-CM | POA: Diagnosis not present

## 2017-03-16 DIAGNOSIS — M25512 Pain in left shoulder: Secondary | ICD-10-CM | POA: Diagnosis not present

## 2017-03-16 DIAGNOSIS — Z800 Family history of malignant neoplasm of digestive organs: Secondary | ICD-10-CM

## 2017-03-16 DIAGNOSIS — Z7984 Long term (current) use of oral hypoglycemic drugs: Secondary | ICD-10-CM | POA: Diagnosis not present

## 2017-03-16 DIAGNOSIS — F329 Major depressive disorder, single episode, unspecified: Secondary | ICD-10-CM | POA: Diagnosis not present

## 2017-03-16 DIAGNOSIS — Z923 Personal history of irradiation: Secondary | ICD-10-CM

## 2017-03-16 DIAGNOSIS — R531 Weakness: Secondary | ICD-10-CM

## 2017-03-16 DIAGNOSIS — I10 Essential (primary) hypertension: Secondary | ICD-10-CM | POA: Insufficient documentation

## 2017-03-16 LAB — CBC WITH DIFFERENTIAL/PLATELET
BASOS ABS: 0.1 10*3/uL (ref 0.0–0.1)
Basophils Relative: 1 %
EOS ABS: 0.3 10*3/uL (ref 0.0–0.7)
EOS PCT: 5 %
HCT: 40.9 % (ref 39.0–52.0)
Hemoglobin: 13.6 g/dL (ref 13.0–17.0)
LYMPHS PCT: 24 %
Lymphs Abs: 1.3 10*3/uL (ref 0.7–4.0)
MCH: 29.2 pg (ref 26.0–34.0)
MCHC: 33.3 g/dL (ref 30.0–36.0)
MCV: 87.8 fL (ref 78.0–100.0)
MONO ABS: 0.4 10*3/uL (ref 0.1–1.0)
Monocytes Relative: 8 %
Neutro Abs: 3.5 10*3/uL (ref 1.7–7.7)
Neutrophils Relative %: 62 %
PLATELETS: 172 10*3/uL (ref 150–400)
RBC: 4.66 MIL/uL (ref 4.22–5.81)
RDW: 13.2 % (ref 11.5–15.5)
WBC: 5.5 10*3/uL (ref 4.0–10.5)

## 2017-03-16 LAB — COMPREHENSIVE METABOLIC PANEL
ALT: 20 U/L (ref 17–63)
AST: 22 U/L (ref 15–41)
Albumin: 4.3 g/dL (ref 3.5–5.0)
Alkaline Phosphatase: 54 U/L (ref 38–126)
Anion gap: 8 (ref 5–15)
BUN: 11 mg/dL (ref 6–20)
CHLORIDE: 101 mmol/L (ref 101–111)
CO2: 29 mmol/L (ref 22–32)
Calcium: 9.5 mg/dL (ref 8.9–10.3)
Creatinine, Ser: 1 mg/dL (ref 0.61–1.24)
Glucose, Bld: 106 mg/dL — ABNORMAL HIGH (ref 65–99)
POTASSIUM: 3.8 mmol/L (ref 3.5–5.1)
Sodium: 138 mmol/L (ref 135–145)
Total Bilirubin: 1.2 mg/dL (ref 0.3–1.2)
Total Protein: 7 g/dL (ref 6.5–8.1)

## 2017-03-16 LAB — MAGNESIUM: MAGNESIUM: 1.9 mg/dL (ref 1.7–2.4)

## 2017-03-16 LAB — PHOSPHORUS: Phosphorus: 3.4 mg/dL (ref 2.5–4.6)

## 2017-03-16 MED ORDER — FENTANYL 50 MCG/HR TD PT72: 50.0000 ug | MEDICATED_PATCH | TRANSDERMAL | 0 refills | Status: DC

## 2017-03-16 MED ORDER — OXYCODONE HCL 5 MG/5ML PO SOLN: 10.0000 mg | ORAL | 0 refills | Status: DC | PRN

## 2017-03-16 MED ORDER — METFORMIN HCL 500 MG PO TABS: 500.0000 mg | ORAL_TABLET | Freq: Every day | ORAL | 1 refills | Status: DC

## 2017-03-16 NOTE — Telephone Encounter (Signed)
MEDICATION: metFORMIN (GLUCOPHAGE) 500 MG tablet  PHARMACY:   [id number] - [place], [place] - [address] [phone] (Phone) [phone] (Fax)     IS THIS A 90 DAY SUPPLY : yes  IS PATIENT OUT OF MEDICATION:   IF NOT; HOW MUCH IS LEFT:   LAST APPOINTMENT DATE: @11 /16/2018  NEXT APPOINTMENT DATE:@1 /16/2019  OTHER COMMENTS:    **Let patient know to contact pharmacy at the end of the day to make sure medication is ready. **  ** Please notify patient to allow 48-72 hours to process**  **Encourage patient to contact the pharmacy for refills or they can request refills through [facility]**

## 2017-03-16 NOTE — Patient Instructions (Signed)
[place] at [facility] Discharge Instructions  RECOMMENDATIONS MADE BY THE CONSULTANT AND ANY TEST RESULTS WILL BE SENT TO YOUR REFERRING PHYSICIAN.  You were seen today by [name] NP. Return as scheduled. Referring you back to Dr. Hilarie Fredrickson with GI in [place] for dysphagia. Referring you to Dr. 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 Don't forget to mention Dr. Merri Ray with psychology to your primary doctor.   Thank you for choosing [place] at [facility] to provide your oncology and hematology care.  To afford each patient quality time with our provider, please arrive at least 15 minutes before your scheduled appointment time.    If you have a lab appointment with the [place] [facility] please come in thru the  Main Entrance and check in at the main information desk  You need to re-schedule your appointment should you arrive 10 or more minutes late.  We strive to give you quality time with our providers, and arriving late affects you and other patients whose appointments are after yours.  Also, if you no show three or more times for appointments you may be dismissed from the clinic at the providers discretion.     Again, thank you for choosing [facility].  Our hope is that these requests will decrease the amount of time that you wait before being seen by our physicians.       _____________________________________________________________  Should you have questions after your visit to [facility], please contact our office at [phone] between the hours of 8:30 a.m. and 4:30 p.m.  Voicemails left after 4:30 p.m. will not be returned until the following business day.  For prescription refill requests, have your pharmacy contact our office.       Resources For Cancer Patients and their Caregivers ? American Cancer Society: Can assist with transportation,  wigs, general needs, runs Look Good Feel Better.        480-068-5154 ? Cancer Care: Provides financial assistance, online support groups, medication/co-pay assistance.  1-800-813-HOPE (307)332-3462) ? [place] Assists [place] cancer patients and their families through emotional , educational and financial support.  304-637-3004 ? [place] DSS Where to apply for food stamps, Medicaid and utility assistance. 984-791-5483 ? [employer]: Transportation to medical appointments. 863-801-6483 ? Social Security Administration: May apply for disability if have a Stage IV cancer. (308)768-2195 713-611-0846 ? [employer], Disability and Transit Services: Assists with nutrition, care and transit needs. [place] Support Programs: @10RELATIVEDAYS @ > Cancer Support Group  2nd Tuesday of the month 1pm-2pm, Journey Room  > Creative Journey  3rd Tuesday of the month 1130am-1pm, Journey Room  > Look Good Feel Better  1st Wednesday of the month 10am-12 noon, Journey Room (Call [place] to register [phone])

## 2017-03-16 NOTE — Progress Notes (Unsigned)
Referral to Dr Maryjean Ka. Records faxed on 1/15

## 2017-03-16 NOTE — Telephone Encounter (Signed)
Rx refill for Metformin was sent to pharmacy as requested.

## 2017-03-16 NOTE — Progress Notes (Signed)
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      10/12/2014 Pathology Results    Accession: [id number]  right tonsil biopsy confirmed squamous cell carcinoma, HPV positive      10/12/2014 Procedure     he underwent biopsy of right tonsil      10/29/2014 Imaging     PET/CT scan showed 3.2 cm hypermetabolic mass in the right palatine tonsil, consistent with known tonsillar carcinoma.       11/06/2014 Imaging    CT neck: Right palatine tonsil mass measuring 2.1 x 2.1 x 2.5 cm.  Asymmetric right LNs in level 1B, 2, and level 3.       12/03/2014 - 01/18/2015 Radiation Therapy    Right tonsil and bilateral neck / 70 Gy in 35 fractions to gross disease, 63 Gy in 35 fractions to high risk nodal echelons, and 56 Gy in 35 fractions to intermediate risk nodal echelons      01/06/2015 Miscellaneous    ER VISIT for dysphagia.  No hospital admission. CT neck done and pt was able to tolerate po liquids afer IV zofran. Discharged home.       01/06/2015 Imaging    CT neck: IMPRESSION: Significant interval treatment response with no residual measurable right tonsil mass. Mild mucosal thickening and hyperenhancement in bilateral oropharynx & epiglottis, likely tx effect. No cervical adenpathy.      04/25/2015 Procedure    PEG placement       05/16/2015 PET scan    Restaging PET scan: Interval response to therapy. Resolution  of hypermetabolism associated with right tonsillar lesion. No evidence of residual or recurrence of tumor or metastatic disease.       03/23/2016 Imaging    CT neck for persistent pain: IMPRESSION: Acute RIGHT submandibular sialoadenitis, RIGHT neck inflammation and pharyngitis. Findings may be secondary to radiation or, infectious.  No convincing evidence of residual nor recurrent tumor.      03/23/2016 Imaging    CT chest/abd for cough/N&V: IMPRESSION: 1. No findings of metastatic disease to the chest or upper abdomen. 2. 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 Recommend annual imaging followup by CTA or MRA. This recommendation follows 2010 ACCF/AHA/AATS/ACR/ASA/SCA/SCAI/SIR/STS/SVM Guidelines for the Diagnosis and Management of Patients with Thoracic Aortic Disease. Circulation. 2010; 121: H212-Y482 3. Other imaging findings of potential clinical significance: Pneumobilia, query prior sphincterotomy. Coronary, aortic arch, and branch vessel atherosclerotic vascular disease. Aortoiliac atherosclerotic vascular disease. Foraminal impingement at L4-5 and L5-S1 due to spondylosis and degenerative disc disease. Bilateral renal cysts.      06/25/2016 Procedure    PEG tube removed with IR.         INTERVAL HISTORY:  Kevin Mcclain 72 y.o. male presents to [facility] after transferring his medical oncology care here since this provider's recent relocation to this office.  (He is my  previous patient treated in [place] at [facility]).  He is here for routine follow-up for h/o Stage II right tonsil cancer.   Overall, he tells me that he has been feeling "okay."  Appetite 50%; energy levels 25%. He feels more fatigued and tired. His wife tells me that "he doesn't want to do much of anything except sleep. He isn't interested in doing things he used to like to do."  Remains on Lexapro (being managed by his PCP). It is sometimes difficult for him to eat, either d/t dry  mouth or to pain.  "It feels like a knife is stabbing me or I get sharp/shooting pains to the back of my throat, down the sides of my neck up to my ears on both sides."  Generally takes ~60 mg of oxycodone per day (~10 mg of oxycodone solution 6 times/day). Also remains on Fentanyl patch 50 mcg.   During his radiation treatments, he had shingles outbreak to his (L) arm. His has some numbness/tingling "deep pain" to this area, that also extends to his (L) thumb at times. Sensation comes and goes. No new rash or shingles outbreak.  He had a fall several months ago and fell on his left shoulder, which he thinks may also be contributing.   Reports having some intermittent choking sensations with liquids and solids. He has seen GI in the past and required esophageal dilation. He has PRN anti-emetics that help with his nausea (which is generally worse in the AM "when my pain is real bad from dry mouth").    Continues to struggle with intermittent tinnitus; "It sounds like crickets chirping in my ears."  He is considering having a hearing aid, but is putting this off for now.  He continues to see [name] with ENT as directed. He was told at his last visit with ENT that his current chronic pain symptoms may be permanent. His wife tells me, "Since he heard that from [name], Kevin Mcclain hasn't wanted to do much of anything except sleep. It's like the rug got pulled out from under him with that."      REVIEW OF SYSTEMS:  Review of Systems  per HPI. 14-point ROS completed and negative except as stated above.    PAST MEDICAL/SURGICAL HISTORY:  Past Medical History:  Diagnosis Date  . Allergy   . Arthritis   . CAD (coronary artery disease)    severe CAD-s/p MI 2000 with PTCA, s/p CABG 2005-by Dr Kevin Mcclain at [place] EF 50-55%  . Cancer ([place]) 10/12/2014   right tonsil=squamous cell  carcinaoma  . Diabetes mellitus type II   . GERD (gastroesophageal reflux disease)    zantac- prn  . Gout   . History  of radiation therapy 12/03/14- 01/18/15   Right Tonsil and Bilateral neck.   . Hyperlipidemia   . Hypertension   . Kidney stones   . Shingles outbreak 04/23/15   left shoulder and left arm  . Vertigo    Past Surgical History:  Procedure Laterality Date  . CHOLECYSTECTOMY  2012  . COLONOSCOPY W/ POLYPECTOMY  2015  . CORONARY ARTERY BYPASS GRAFT  2005   6 grafts  . CYSTOSCOPY     kidney stone removal  . IR GASTROSTOMY TUBE REMOVAL  06/25/2016  . LITHOTRIPSY    . MULTIPLE EXTRACTIONS WITH ALVEOLOPLASTY N/A 11/14/2014   Procedure: Extraction of tooth #2 and 31 with alveoloplasty after sectioning of bridge at distal #29 with full mouth debridement of remaining dention;  Surgeon: Lenn Cal, DDS;  Location: [facility];  Service: Oral Surgery;  Laterality: N/A;  . NASAL SINUS SURGERY Right 11/14/2014   Procedure: RIGHT ENDOSCOPIC ANTROSTOMY AND ANTERIOR ETHMOIDECTOMY ;  Surgeon: Jodi Marble, MD;  Location: [facility];  Service: ENT;  Laterality: Right;     SOCIAL HISTORY:  Social History   Socioeconomic History  . Marital status: Married    Spouse name: Not on file  . Number of children: Not on file  . Years of education: Not on file  . Highest education level: Not on file  Social Needs  . Financial resource strain: Not on file  . Food insecurity - worry: Not on file  . Food insecurity - inability: Not on file  . Transportation needs - medical: Not on file  . Transportation needs - non-medical: Not on file  Occupational History  . Not on file  Tobacco Use  . Smoking status: Never Smoker  . Smokeless tobacco: Never Used  Substance and Sexual Activity  . Alcohol use: No  . Drug use: No  . Sexual activity: Not on file  Other Topics Concern  . Not on file  Social History Narrative   Married 35 years in October 2017.       Occupation :  Retired Arboriculturist for [employer].  Worked for [employer]: garden and work in yard    FAMILY HISTORY:  Family  History  Problem Relation Age of Onset  . Heart attack Mother        deceased age 6 secondary  . Colon cancer Father   . Stroke Father        deceased age 15  . Heart disease Father        heart attack  . Stroke Sister 71  . Coronary artery disease Brother 22       s/p bypass surgery /stent    CURRENT MEDICATIONS:  Outpatient Encounter Medications as of 03/16/2017  Medication Sig  . aspirin 81 MG tablet Take 81 mg by mouth daily. Does not take everyday  . atorvastatin (LIPITOR) 10 MG tablet Place 1 tablet (10 mg total) into feeding tube daily.  Marland Kitchen escitalopram (LEXAPRO) 10 MG tablet Take 1 tablet (10 mg total) by mouth daily.  . fentaNYL (DURAGESIC - DOSED MCG/HR) 50 MCG/HR Place 1 patch (50 mcg total) onto the skin every 3 (three) days.  Marland Kitchen levothyroxine (SYNTHROID, LEVOTHROID) 50 MCG tablet Take 1 tablet (50 mcg total) by mouth daily.  . metFORMIN (GLUCOPHAGE) 500 MG tablet Take 1 tablet (500 mg total) by mouth daily with breakfast.  . metoprolol succinate (TOPROL-XL) 25 MG 24 hr tablet Take 1 tablet (25 mg total) daily by mouth. Take with or immediately following a meal.  . nitroGLYCERIN (NITROSTAT) 0.4 MG SL tablet Place 1 tablet (0.4 mg total) under the tongue every 5 (five) minutes as needed.  . ondansetron (ZOFRAN) 4 MG tablet TAKE 1 TABLET (4 MG TOTAL) BY MOUTH EVERY 8 (EIGHT) HOURS AS NEEDED FOR NAUSEA OR VOMITING.  . OVER THE COUNTER MEDICATION Place 1 drop into both eyes 3 (three) times daily as needed (dry eyes/ irritation). Clear Cooling Eye Drops  . oxyCODONE (ROXICODONE) 5 MG/5ML solution Take 10-15 mLs (10-15 mg total) by mouth every 4 (four) hours as needed for severe pain.  Marland Kitchen prochlorperazine (COMPAZINE) 10 MG tablet Take 1 tablet (10 mg total) by mouth every 6 (six) hours as needed for nausea or vomiting.  Marland Kitchen  ramipril (ALTACE) 5 MG capsule Take 2 pills in the morning, Take 1 pill in the evening  . sodium fluoride (PREVIDENT 5000 PLUS) 1.1 % CREA dental cream Apply to  tooth brush. Brush teeth for 2 minutes. Spit out excess-DO NOT swallow. Repeat nightly.  . [DISCONTINUED] fentaNYL (DURAGESIC - DOSED MCG/HR) 50 MCG/HR Place 1 patch (50 mcg total) onto the skin every 3 (three) days.  . [DISCONTINUED] fentaNYL (DURAGESIC - DOSED MCG/HR) 50 MCG/HR Place 1 patch (50 mcg total) onto the skin every 3 (three) days.  . [DISCONTINUED] fentaNYL (DURAGESIC - DOSED MCG/HR) 50 MCG/HR Place 1 patch (50 mcg total) onto the skin every 3 (three) days.  . [DISCONTINUED] oxyCODONE (ROXICODONE) 5 MG/5ML solution Take 10-15 mLs (10-15 mg total) by mouth every 4 (four) hours as needed for severe pain.  . [DISCONTINUED] oxyCODONE (ROXICODONE) 5 MG/5ML solution Take 10-15 mLs (10-15 mg total) by mouth every 4 (four) hours as needed for severe pain.  . [DISCONTINUED] oxyCODONE (ROXICODONE) 5 MG/5ML solution Take 10-15 mLs (10-15 mg total) by mouth every 4 (four) hours as needed for severe pain.   No facility-administered encounter medications on file as of 03/16/2017.     ALLERGIES:  Allergies  Allergen Reactions  . Benzonatate Swelling    "swells my throat"  . Codeine Itching  . Sitagliptin Phosphate Itching     PHYSICAL EXAM:  ECOG Performance status: 1 - Symptomatic, but independent  Vitals:   03/16/17 1434  BP: (!) 135/51  Pulse: (!) 48  Resp: 16  Temp: 98.1 F (36.7 C)  SpO2: 100%   Filed Weights   03/16/17 1434  Weight: 129 lb 6.4 oz (58.7 kg)    Physical Exam  Constitutional: He is oriented to person, place, and time.  Thin male in no acute distress  HENT:  Head: Normocephalic.  Posterior oropharynx mildly erythematous with mild telangectasia (as expected s/p XRT).  Xerostomia appreciated.   Eyes: Conjunctivae are normal. Pupils are equal, round, and reactive to light. No scleral icterus.  Neck: Normal range of motion. Neck supple.  Mild submental lymphedema.  Diffuse fibrosis to anterior neck s/p XRT; no palpable masses.   Cardiovascular: Regular  rhythm.  Bradycardic  Pulmonary/Chest: Effort normal and breath sounds normal. No respiratory distress. He has no wheezes.  Abdominal: Soft. Bowel sounds are normal. There is no tenderness.  Musculoskeletal: Normal range of motion. He exhibits no edema.  Lymphadenopathy:    He has no cervical adenopathy.       Right: No supraclavicular adenopathy present.       Left: No supraclavicular adenopathy present.  Neurological: He is alert and oriented to person, place, and time. No cranial nerve deficit. Gait normal.  Skin: Skin is warm and dry. No rash noted.  Psychiatric: Mood, memory, affect and judgment normal.  Nursing note and vitals reviewed.    LABORATORY DATA:  I have reviewed the labs as listed.  CBC    Component Value Date/Time   WBC 5.5 03/16/2017 1323   RBC 4.66 03/16/2017 1323   HGB 13.6 03/16/2017 1323   HGB 13.3 04/23/2015 1102   HCT 40.9 03/16/2017 1323   HCT 37.6 (L) 04/23/2015 1102   PLT 172 03/16/2017 1323   PLT 163 04/23/2015 1102   MCV 87.8 03/16/2017 1323   MCV 89.3 04/23/2015 1102   MCH 29.2 03/16/2017 1323   MCHC 33.3 03/16/2017 1323   RDW 13.2 03/16/2017 1323   RDW 13.2 04/23/2015 1102   LYMPHSABS 1.3 03/16/2017 1323  LYMPHSABS 0.4 (L) 04/23/2015 1102   MONOABS 0.4 03/16/2017 1323   MONOABS 0.6 04/23/2015 1102   EOSABS 0.3 03/16/2017 1323   EOSABS 0.1 04/23/2015 1102   BASOSABS 0.1 03/16/2017 1323   BASOSABS 0.0 04/23/2015 1102   CMP Latest Ref Rng & Units 03/16/2017 10/30/2016 06/29/2016  Glucose 65 - 99 mg/dL 106(H) 104(H) 88  BUN 6 - 20 mg/dL 11 9 10   Creatinine 0.61 - 1.24 mg/dL 1.00 0.91 0.82  Sodium 135 - 145 mmol/L 138 134(L) 139  Potassium 3.5 - 5.1 mmol/L 3.8 4.6 4.0  Chloride 101 - 111 mmol/L 101 97 102  CO2 22 - 32 mmol/L 29 31 31   Calcium 8.9 - 10.3 mg/dL 9.5 9.4 9.4  Total Protein 6.5 - 8.1 g/dL 7.0 6.2 6.4  Total Bilirubin 0.3 - 1.2 mg/dL 1.2 0.7 0.6  Alkaline Phos 38 - 126 U/L 54 65 73  AST 15 - 41 U/L 22 22 21   ALT 17 - 63 U/L  20 20 18     PENDING LABS:    DIAGNOSTIC IMAGING:  CT neck: 03/23/16     PATHOLOGY:  (R) tonsil biopsy: 10/12/14            ASSESSMENT & PLAN:   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 s/p definitive treatment with radiation alone; completed treatment on 01/18/15.  -Remains without clinical evidence of recurrence, which is excellent. His risk of recurrence at this point is much lower.  Last saw [name] in 04/8313 with reported no recurrence on his exam.  Recommend continued follow-up visits with [name] as he deems appropriate.  -Return to cancer center in 3 months for follow-up. Labs reviewed from today's visit and are completely within normal limits. Will not collect labs at next visit given stability of his blood work.   Hypothyroidism:  -Last TSH was normal at 3.05 on 10/30/16. Currently on Synthroid 50 mcg daily. His hypothyroidism is managed by his PCP. Certainly, his fatigue could be secondary to his hypothyroidism. However, I suspect that the majority of his fatigue is coming from his depression.   -Maintain follow-up with PCP as directed. He is reportedly scheduled to see his PCP tomorrow (03/17/17).   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 May have an element of neuropathic pain as well, given his persistence of symptoms.  I have low clinical suspicion that his continued pain is d/t cancer recurrence, but rather continued long-term side effects of his cancer treatments.   -Current pain regimen with 50 mcg Fentanyl patch and Oxycodone solution PRN has helped manage his pain somewhat. Historically, he did not tolerate gabapentin well during acute recovery from radiation.  He has also tried oxycodone tablets in the past, which caused N&V.  -I explained to pt/wife that generally, from a cancer recovery point of view, symptoms that persist after 2 years of therapy may be  permanent in varying degrees of severity.  Of course, this does not mean that we will not try to optimize his symptoms as much as we can.   -I think he may benefit from referral to Dr. Maryjean Ka with [facility] to see if there may be a different pain management approach.  Mr. Maish understands that his side effects of treatment may be permanent, but I think it is reasonable to try to optimize his pain control, as it affects his daily life.   -Continue Fentanyl patch and Oxycodone solution at current doses  for now. If Dr. Maryjean Ka sees patient and would like to assume management of pt's pain medications, I am happy to have him do so.  [place] Controlled Substance Reporting System reviewed and refills will be appropriate beginning in 12/2016.   I will give him several prescriptions to last him ~3 months.  The following paper prescriptions provided today. He will contact our office when he needs additional refills in ~3 months.   *Fentanyl patch 50 mcg transdermal Q72H, #10 (for 04/2017)  *Fentanyl patch 50 mcg transdermal Q72H, #10 (for 04/2017)  *Fentanyl patch 50 mcg transdermal Q72H, #10 (for 05/2017)  *Oxycodone soln 5mg /57mL, take 10-15mg   po Q4Hprn, #2064mL (for 04/2017)  *Oxycodone soln 5mg /31mL, take 10-15mg   po Q4Hprn, #2056mL (for 04/2017)  Oxycodone soln 5mg /80mL, take 10-15mg   po Q4Hprn, #2061mL (for 05/2017)   Dysphagia/Nausea:  -May be secondary to fibrosis s/p XRT for tonsil cancer.  -Has required EGD with esophageal dilation in the past by GI (Dr. Hilarie Fredrickson).  Taking Zofran/Compazine PRN for nausea. It has been quite some time since he has seen GI; will refer him back to see Dr. Hilarie Fredrickson to see if he thinks additional esophageal dilation is appropriate.    Depression and anxiety:  -Was started on Cymbalta, but did not tolerate well.  His depression and anxiety are currently being managed by his PCP.  Cymbalta was weaned off and he is now on Lexapro.  May need more optimal control of his  depression given expressed anhedonia today.   -Discussed possibility of discussing with his PCP the option of referral to psychiatry, specifically Dr. Daron Offer, with [facility] in [place].  I think this referral would be best from PCP, as he is currently managing pt's depression and anxiety.   -I think he has an element of PTSD and grief s/p his cancer diagnosis and subsequent treatment. We explored some of these feelings today and he was provided support and reassurance with active listening and validation of his concerns.  We discussed that it is not uncommon for cancer survivors to experience grief/loss, even years after a cancer diagnosis; there is also no timetable for grief and each person's experience is unique.  His wife voices that he "always feels better when he comes and talks to [place]."  In this light, I think he may benefit from professional counseling/support to continue to process his cancer experience and begin to more fully psychologically heal.  He is agreeable to psych referral and possible counseling, if deemed appropriate by his PCP.          Dispo:  -Refer to Dr. Maryjean Ka for additional pain management. -Refer back to Dr. Hilarie Fredrickson with GI for dysphagia/possible esophageal dilation.  -Continue to see [name] (ENT) as directed.  -Return to cancer center in 3 months for continued follow-up; no labs needed at that visit.    All questions were answered to patient's stated satisfaction. Encouraged patient to call with any new concerns or questions before his next visit to the cancer center and we can certain see him sooner, if needed.      Orders placed this encounter:  No orders of the defined types were placed in this encounter.     [name], NP [facility] [phone]

## 2017-03-17 ENCOUNTER — Encounter: Payer: Self-pay | Admitting: Family Medicine

## 2017-03-17 ENCOUNTER — Ambulatory Visit (INDEPENDENT_AMBULATORY_CARE_PROVIDER_SITE_OTHER): Payer: Medicare HMO | Admitting: Family Medicine

## 2017-03-17 VITALS — BP 140/62 | HR 50 | Temp 97.9°F | Ht 64.0 in | Wt 131.0 lb

## 2017-03-17 DIAGNOSIS — D692 Other nonthrombocytopenic purpura: Secondary | ICD-10-CM | POA: Diagnosis not present

## 2017-03-17 DIAGNOSIS — R69 Illness, unspecified: Secondary | ICD-10-CM | POA: Diagnosis not present

## 2017-03-17 DIAGNOSIS — I10 Essential (primary) hypertension: Secondary | ICD-10-CM

## 2017-03-17 DIAGNOSIS — F321 Major depressive disorder, single episode, moderate: Secondary | ICD-10-CM

## 2017-03-17 DIAGNOSIS — F320 Major depressive disorder, single episode, mild: Secondary | ICD-10-CM | POA: Diagnosis not present

## 2017-03-17 DIAGNOSIS — E038 Other specified hypothyroidism: Secondary | ICD-10-CM

## 2017-03-17 DIAGNOSIS — E1142 Type 2 diabetes mellitus with diabetic polyneuropathy: Secondary | ICD-10-CM | POA: Diagnosis not present

## 2017-03-17 LAB — TSH: TSH: 4.88 u[IU]/mL — AB (ref 0.35–4.50)

## 2017-03-17 LAB — HEMOGLOBIN A1C: HEMOGLOBIN A1C: 5.3 % (ref 4.6–6.5)

## 2017-03-17 MED ORDER — SERTRALINE HCL 100 MG PO TABS: 100.0000 mg | ORAL_TABLET | Freq: Every day | ORAL | 5 refills | Status: DC

## 2017-03-17 NOTE — Patient Instructions (Addendum)
Please stop by lab before you go- a1c and thyroid  We will call you within a few weeks about your referral to Dr. Daron Offer. If you do not hear within 3 weeks, give [place] a call. If they tell you someone then other Dr. Daron Offer let me know.   This will be your last day of lexapro 10mg - lets have you try zoloft 100mg  starting tomorrow.   Happy to check in a few months from now or sooner if you need [place]

## 2017-03-17 NOTE — Progress Notes (Signed)
Subjective:  Kevin Mcclain is a 72 y.o. year old very pleasant male patient who presents for/with See problem oriented charting ROS-no chest pain reported.  No edema.  No suicidal ideation.  Does have some depressed mood and anhedonia.  Wants to sleep a fair amount.  Past Medical History-  Patient Active Problem List   Diagnosis Date Noted  . Hypothyroidism 09/11/2016    Priority: High  . Ascending aortic aneurysm ([place]) 03/25/2016    Priority: High  . Cancer of tonsillar fossa ([place]) 10/31/2014    Priority: High  . Osteoarthritis 11/18/2010    Priority: High  . CAD (coronary artery disease) 10/11/2008    Priority: High  . Type 2 diabetes mellitus with peripheral neuropathy ([place]) 01/24/2007    Priority: High  . Depression, major, single episode, moderate ([place]) 04/02/2016    Priority: Medium  . Diabetic neuropathy ([place]) 12/26/2015    Priority: Medium  . Gout 03/28/2007    Priority: Medium  . HYPERLIPIDEMIA 01/24/2007    Priority: Medium  . Essential hypertension 01/24/2007    Priority: Medium  . B12 deficiency 01/15/2017    Priority: Low  . G tube feedings ([place]) 06/10/2015    Priority: Low  . GERD (gastroesophageal reflux disease) 06/10/2015    Priority: Low  . Arthropathy 03/10/2013    Priority: Low  . Low back pain 11/26/2011    Priority: Low  . Kidney stone 11/04/2011    Priority: Low  . Senile purpura ([place]) 03/17/2017  . BPH associated with nocturia 10/30/2016  . Herpes zoster 07/15/2015    Medications- reviewed and updated Current Outpatient Medications  Medication Sig Dispense Refill  . aspirin 81 MG tablet Take 81 mg by mouth daily. Does not take everyday    . atorvastatin (LIPITOR) 10 MG tablet Place 1 tablet (10 mg total) into feeding tube daily. 90 tablet 3  . escitalopram (LEXAPRO) 10 MG tablet Take 1 tablet (10 mg total) by mouth daily. 90 tablet 1  . fentaNYL (DURAGESIC - DOSED MCG/HR) 50 MCG/HR Place 1 patch (50 mcg total) onto the skin every 3  (three) days. 10 patch 0  . levothyroxine (SYNTHROID, LEVOTHROID) 50 MCG tablet Take 1 tablet (50 mcg total) by mouth daily. 90 tablet 3  . metFORMIN (GLUCOPHAGE) 500 MG tablet Take 1 tablet (500 mg total) by mouth daily with breakfast. 90 tablet 1  . metoprolol succinate (TOPROL-XL) 25 MG 24 hr tablet Take 1 tablet (25 mg total) daily by mouth. Take with or immediately following a meal. 90 tablet 3  . nitroGLYCERIN (NITROSTAT) 0.4 MG SL tablet Place 1 tablet (0.4 mg total) under the tongue every 5 (five) minutes as needed. 50 tablet 3  . ondansetron (ZOFRAN) 4 MG tablet TAKE 1 TABLET (4 MG TOTAL) BY MOUTH EVERY 8 (EIGHT) HOURS AS NEEDED FOR NAUSEA OR VOMITING.  5  . OVER THE COUNTER MEDICATION Place 1 drop into both eyes 3 (three) times daily as needed (dry eyes/ irritation). Clear Cooling Eye Drops    . oxyCODONE (ROXICODONE) 5 MG/5ML solution Take 10-15 mLs (10-15 mg total) by mouth every 4 (four) hours as needed for severe pain. 2000 mL 0  . prochlorperazine (COMPAZINE) 10 MG tablet Take 1 tablet (10 mg total) by mouth every 6 (six) hours as needed for nausea or vomiting. 90 tablet 4  . ramipril (ALTACE) 5 MG capsule Take 2 pills in the morning, Take 1 pill in the evening 135 capsule 3  . sodium fluoride (PREVIDENT 5000 PLUS) 1.1 %  CREA dental cream Apply to tooth brush. Brush teeth for 2 minutes. Spit out excess-DO NOT swallow. Repeat nightly. 1 Tube prn   No current facility-administered medications for this visit.     Objective: BP 140/62 (BP Location: Left Arm, Patient Position: Sitting, Cuff Size: Large)   Pulse (!) 50   Temp 97.9 F (36.6 C) (Oral)   Ht 5\' 4"  (1.626 m)   Wt 131 lb (59.4 kg)   SpO2 96%   BMI 22.49 kg/m  Gen: NAD, resting comfortably CV: RRR no murmurs rubs or gallops Lungs: CTAB no crackles, wheeze, rhonchi Abdomen: soft/nontender/nondistended/normal bowel sounds. No rebound or guarding.  Thin in areas but also has some abdominal obesity. Ext: no edema Skin:  warm, dry, several bruises over bilateral forearms  Assessment/Plan:  Other notes 1. Reports needing to see GI for potential esophageal dilation  Essential hypertension S: controlled on ramipril 10mg  AM, 5 mg PM and metoprolol yesterday- diastolic right at goal today though would prefer 138 or less.  BP Readings from Last 3 Encounters:  03/17/17 140/62  03/16/17 (!) 135/51  01/15/17 132/78  A/P: We discussed blood pressure goal of <140/90. Continue current meds- recheck at follow up  Hypothyroidism S: TSH ok on repeat on levothyroxine 50 mcg last visit- had considered titrating up Lab Results  Component Value Date   TSH 3.43 01/15/2017  A/P: Update TSH today  Depression, major, single episode, moderate (HCC) S:  PHQ9 of 15 last visit. Titrated lexapro to 10mg .  PHQ 9 remains elevated at 14.  No suicidal ideation.  Wife and patient report he actually felt best on the Zoloft before the requested trial of Cymbalta.  See my prior notes about trials of medication.  They also discussed with oncology yesterday potential referral to Dr. Daron Offer of [facility] psychiatry. A/P: Will refer to Dr. Daron Offer today.  Discussed [place] behavioral health referral which they declined for now.  Will trial Zoloft 100 mg daily.  Advised follow-up with me within 2 months.  Obviously he has any suicidal ideation he should be seen immediately-but that has not occurred and hopefully will not happen.  Type 2 diabetes mellitus with peripheral neuropathy (HCC) Has been controlled without medication.  We will update A1c.  Foot exam normal today.  Has not seen eye doctor in 2 years but he will schedule  Senile purpura ([place]) Patient complains of easy bruising on both arms and hands.  Has been going on for some time.  Has several bruises on both forearms and does not remember hitting anything.  -Discussed diagnosis of senile purpura.  Slightly atypical for him to have some response without remembering activity such as working  outside-but he will update me if he continues to get more lesions.  CBC reassuring yesterday with no thrombocytopenia    Future Appointments  Date Time Provider [place]  05/13/2017 10:00 AM Pyrtle, Lajuan Lines, MD [facility]  06/16/2017  2:00 PM Holley Bouche, NP [facility] None  Discussed follow-up within a few months.  Happy to see sooner if needed.  Hopefully he can see Dr. Daron Offer in the interim.  Lab/Order associations:  is- Plan: Hemoglobin A1c  Other specified hypothyroidism - Plan: TSH  Depression ([place]) - Plan: Ambulatory referral to Psychiatry   Meds ordered this encounter  Medications  . sertraline (ZOLOFT) 100 MG tablet    Sig: Take 1 tablet (100 mg total) by mouth daily.    Dispense:  30 tablet    Refill:  5  Return precautions advised.  Garret Reddish, MD

## 2017-03-17 NOTE — Assessment & Plan Note (Signed)
Patient complains of easy bruising on both arms and hands.  Has been going on for some time.  Has several bruises on both forearms and does not remember hitting anything.  -Discussed diagnosis of senile purpura.  Slightly atypical for him to have some response without remembering activity such as working outside-but he will update me if he continues to get more lesions.  CBC reassuring yesterday with no thrombocytopenia

## 2017-03-17 NOTE — Assessment & Plan Note (Signed)
S: controlled on ramipril 10mg  AM, 5 mg PM and metoprolol yesterday- diastolic right at goal today though would prefer 138 or less.  BP Readings from Last 3 Encounters:  03/17/17 140/62  03/16/17 (!) 135/51  01/15/17 132/78  A/P: We discussed blood pressure goal of <140/90. Continue current meds- recheck at follow up

## 2017-03-17 NOTE — Assessment & Plan Note (Addendum)
S:  PHQ9 of 15 last visit. Titrated lexapro to 10mg .  PHQ 9 remains elevated at 14.  No suicidal ideation.  Wife and patient report he actually felt best on the Zoloft before the requested trial of Cymbalta.  See my prior notes about trials of medication.  They also discussed with oncology yesterday potential referral to Dr. Daron Offer of [facility] psychiatry. A/P: Will refer to Dr. Daron Offer today.  Discussed [place] behavioral health referral which they declined for now.  Will trial Zoloft 100 mg daily.  Advised follow-up with me within 2 months.  Obviously he has any suicidal ideation he should be seen immediately-but that has not occurred and hopefully will not happen.

## 2017-03-17 NOTE — Assessment & Plan Note (Signed)
S: TSH ok on repeat on levothyroxine 50 mcg last visit- had considered titrating up Lab Results  Component Value Date   TSH 3.43 01/15/2017  A/P: Update TSH today

## 2017-03-17 NOTE — Assessment & Plan Note (Signed)
Has been controlled without medication.  We will update A1c.  Foot exam normal today.  Has not seen eye doctor in 2 years but he will schedule

## 2017-03-19 ENCOUNTER — Other Ambulatory Visit: Payer: Self-pay | Admitting: *Deleted

## 2017-03-19 MED ORDER — LEVOTHYROXINE SODIUM 75 MCG PO TABS: 75.0000 ug | ORAL_TABLET | Freq: Every day | ORAL | 1 refills | Status: DC

## 2017-03-23 ENCOUNTER — Ambulatory Visit: Payer: Medicare HMO | Admitting: Internal Medicine

## 2017-03-23 ENCOUNTER — Encounter: Payer: Self-pay | Admitting: *Deleted

## 2017-03-23 VITALS — BP 124/58 | HR 56 | Ht 62.75 in | Wt 127.1 lb

## 2017-03-23 DIAGNOSIS — R110 Nausea: Secondary | ICD-10-CM

## 2017-03-23 DIAGNOSIS — R1310 Dysphagia, unspecified: Secondary | ICD-10-CM

## 2017-03-23 DIAGNOSIS — Z85818 Personal history of malignant neoplasm of other sites of lip, oral cavity, and pharynx: Secondary | ICD-10-CM | POA: Diagnosis not present

## 2017-03-23 DIAGNOSIS — Z923 Personal history of irradiation: Secondary | ICD-10-CM | POA: Diagnosis not present

## 2017-03-23 NOTE — Patient Instructions (Signed)
You have been scheduled for a Barium Esophogram at [facility] (1st floor of the hospital) on Thursday 03/25/17 at 9:30 am. Please arrive 15 minutes prior to your appointment for registration. Make certain not to have anything to eat or drink 6 hours prior to your test. If you need to reschedule for any reason, please contact radiology at [phone] to do so. __________________________________________________________________ A barium swallow is an examination that concentrates on views of the esophagus. This tends to be a double contrast exam (barium and two liquids which, when combined, create a gas to distend the wall of the oesophagus) or single contrast (non-ionic iodine based). The study is usually tailored to your symptoms so a good history is essential. Attention is paid during the study to the form, structure and configuration of the esophagus, looking for functional disorders (such as aspiration, dysphagia, achalasia, motility and reflux) EXAMINATION You may be asked to change into a gown, depending on the type of swallow being performed. A radiologist and radiographer will perform the procedure. The radiologist will advise you of the type of contrast selected for your procedure and direct you during the exam. You will be asked to stand, sit or lie in several different positions and to hold a small amount of fluid in your mouth before being asked to swallow while the imaging is performed .In some instances you may be asked to swallow barium coated marshmallows to assess the motility of a solid food bolus. The exam can be recorded as a digital or video fluoroscopy procedure. POST PROCEDURE It will take 1-2 days for the barium to pass through your system. To facilitate this, it is important, unless otherwise directed, to increase your fluids for the next 24-48hrs and to resume your normal diet.  This test typically takes about 30 minutes to  perform. _________________________________________________________________________  Kevin Mcclain have been scheduled for an endoscopy. Please follow written instructions given to you at your visit today. If you use inhalers (even only as needed), please bring them with you on the day of your procedure. Your physician has requested that you go to www.startemmi.com and enter the access code given to you at your visit today. This web site gives a general overview about your procedure. However, you should still follow specific instructions given to you by our office regarding your preparation for the procedure.  If you are age 88 or older, your body mass index should be between 23-30. Your Body mass index is 22.7 kg/m. If this is out of the aforementioned range listed, please consider follow up with your Primary Care Provider.  If you are age 69 or younger, your body mass index should be between 19-25. Your Body mass index is 22.7 kg/m. If this is out of the aformentioned range listed, please consider follow up with your Primary Care Provider.

## 2017-03-24 ENCOUNTER — Encounter: Payer: Self-pay | Admitting: Internal Medicine

## 2017-03-24 ENCOUNTER — Telehealth: Payer: Self-pay | Admitting: Family Medicine

## 2017-03-24 NOTE — Telephone Encounter (Signed)
Called and left message for patient concerning referral. Informed patient that for psych referral, he would just need to contact [facility] at [phone]. No further action needed.

## 2017-03-24 NOTE — Progress Notes (Signed)
Subjective:    Patient ID: Kevin Mcclain, male    DOB: [birth year], 72 y.o.   MRN: [id number]  HPI Kevin Mcclain is a 72 yo male with past medical history of tonsillar cancer status post radiation (last radiation November 2016), GERD with dysphagia, diabetes, CAD, hypertension, hyperlipidemia, anxiety and depression.  He is here today with his wife.  He was added in the clinic because he reports that he has had a sore throat and having a harder time swallowing recently.  He feels like food is hanging up high in his chest and near his neck.  This can be present with both solids and liquids but seems to be worse with solid foods.  Pills can be difficult for him to swallow.  He also has noticed some coughing when he is eating.  His mouth remains dry but he uses Biotene spray.  He is not really having heartburn or reflux.  He continues his chronic opioid pain medication with fentanyl patch and liquid oxycodone.  He does continue to have chronic nausea without vomiting He had an upper endoscopy which I performed to evaluate dysphagia on 04/01/2015.  This showed some edema felt secondary to radiation of the arachnoid cartilage.  There was some mild resistance at the upper esophageal sphincter to passage of the endoscope and on withdrawal a superficial mucosal tear was seen across the upper esophageal sphincter.  The distal esophagus, stomach and examined small bowel were normal.  After that endoscopy he felt like his swallowing had improved.  His PEG tube was removed last year in April 2018.   Review of Systems As per HPI, otherwise negative  Current Medications, Allergies, Past Medical History, Past Surgical History, Family History and Social History were reviewed in [employer] record.     Objective:   Physical Exam BP (!) 124/58 (BP Location: Left Arm, Patient Position: Sitting, Cuff Size: Normal)   Pulse (!) 56   Ht 5' 2.75" (1.594 m) Comment: height measured without  shoes  Wt 127 lb 2 oz (57.7 kg)   BMI 22.70 kg/m  Constitutional: Well-developed and well-nourished. No distress. HEENT: Normocephalic and atraumatic. Oropharynx is clear and dry..  Limited jaw opening.  Conjunctivae are normal.  No scleral icterus. Neck: Anterior neck stiffness/fibrosis without palpable mass Cardiovascular: Normal rate, regular rhythm and intact distal pulses. No M/R/G Pulmonary/chest: Effort normal and breath sounds normal. No wheezing, rales or rhonchi. Abdominal: Soft, nontender, nondistended. Bowel sounds active throughout.  Extremities: no clubbing, cyanosis, or edema Neurological: Alert and oriented to person place and time. Skin: Skin is warm and dry. Psychiatric: Normal mood and affect. Behavior is normal.  CBC    Component Value Date/Time   WBC 5.5 03/16/2017 1323   RBC 4.66 03/16/2017 1323   HGB 13.6 03/16/2017 1323   HGB 13.3 04/23/2015 1102   HCT 40.9 03/16/2017 1323   HCT 37.6 (L) 04/23/2015 1102   PLT 172 03/16/2017 1323   PLT 163 04/23/2015 1102   MCV 87.8 03/16/2017 1323   MCV 89.3 04/23/2015 1102   MCH 29.2 03/16/2017 1323   MCHC 33.3 03/16/2017 1323   RDW 13.2 03/16/2017 1323   RDW 13.2 04/23/2015 1102   LYMPHSABS 1.3 03/16/2017 1323   LYMPHSABS 0.4 (L) 04/23/2015 1102   MONOABS 0.4 03/16/2017 1323   MONOABS 0.6 04/23/2015 1102   EOSABS 0.3 03/16/2017 1323   EOSABS 0.1 04/23/2015 1102   BASOSABS 0.1 03/16/2017 1323   BASOSABS 0.0 04/23/2015 1102   CMP  Component Value Date/Time   NA 138 03/16/2017 1323   NA 141 05/16/2015 1045   K 3.8 03/16/2017 1323   K 4.5 05/16/2015 1045   CL 101 03/16/2017 1323   CO2 29 03/16/2017 1323   CO2 31 (H) 05/16/2015 1045   GLUCOSE 106 (H) 03/16/2017 1323   GLUCOSE 116 05/16/2015 1045   BUN 11 03/16/2017 1323   BUN 17.3 05/16/2015 1045   CREATININE 1.00 03/16/2017 1323   CREATININE 0.8 05/16/2015 1045   CALCIUM 9.5 03/16/2017 1323   CALCIUM 9.8 05/16/2015 1045   PROT 7.0 03/16/2017 1323    PROT 6.9 05/16/2015 1045   ALBUMIN 4.3 03/16/2017 1323   ALBUMIN 3.9 05/16/2015 1045   AST 22 03/16/2017 1323   AST 34 05/16/2015 1045   ALT 20 03/16/2017 1323   ALT 49 05/16/2015 1045   ALKPHOS 54 03/16/2017 1323   ALKPHOS 87 05/16/2015 1045   BILITOT 1.2 03/16/2017 1323   BILITOT 0.61 05/16/2015 1045   GFRNONAA >60 03/16/2017 1323   GFRAA >60 03/16/2017 1323       Assessment & Plan:  72 yo male with past medical history of tonsillar cancer status post radiation (last radiation November 2016), GERD with dysphagia, diabetes, CAD, hypertension, hyperlipidemia, anxiety and depression.   1.  Dysphagia --all likely related to fibrosis from radiation with his tonsillar cancer.  He is having some coughing with swallowing raising the question of an aspiration element.  We will proceed with barium esophagram first followed by upper endoscopy.  We discussed the risk, benefits and alternatives and he is agreeable to proceed.  2.  Chronic nausea --likely in part related to chronic opioid pain medication.  He will continue Zofran and Compazine on an as-needed basis.  25 minutes spent with the patient today. Greater than 50% was spent in counseling and coordination of care with the patient

## 2017-03-25 ENCOUNTER — Ambulatory Visit (HOSPITAL_COMMUNITY)
Admission: RE | Admit: 2017-03-25 | Discharge: 2017-03-25 | Disposition: A | Discharge status: Home / Self Care | Payer: Medicare HMO | Source: Ambulatory Visit | Attending: Internal Medicine | Admitting: Internal Medicine

## 2017-03-25 ENCOUNTER — Encounter: Payer: Self-pay | Admitting: Internal Medicine

## 2017-03-25 DIAGNOSIS — R1310 Dysphagia, unspecified: Secondary | ICD-10-CM

## 2017-04-02 MED FILL — oxyCODONE HCL 5 MG/5ML SOLN: 5 | 22 days supply | Qty: 2000 | Fill #0

## 2017-04-06 ENCOUNTER — Encounter: Payer: Self-pay | Admitting: Internal Medicine

## 2017-04-06 ENCOUNTER — Telehealth: Payer: Self-pay | Admitting: Internal Medicine

## 2017-04-06 ENCOUNTER — Ambulatory Visit (AMBULATORY_SURGERY_CENTER): Payer: Medicare HMO | Admitting: Internal Medicine

## 2017-04-06 ENCOUNTER — Other Ambulatory Visit: Payer: Self-pay

## 2017-04-06 VITALS — BP 102/51 | HR 53 | Temp 98.4°F | Resp 11 | Ht 62.0 in | Wt 127.0 lb

## 2017-04-06 DIAGNOSIS — R1310 Dysphagia, unspecified: Secondary | ICD-10-CM | POA: Diagnosis not present

## 2017-04-06 MED ORDER — SODIUM CHLORIDE 0.9 % IV SOLN: 500.0000 mL | Freq: Once | INTRAVENOUS | Status: DC

## 2017-04-06 NOTE — Op Note (Signed)
[place] Patient Name: Kevin Mcclain Procedure Date: 04/06/2017 3:33 PM MRN: [id number] Endoscopist: Jerene Bears , MD Age: 72 Referring MD:  Date of Birth: 12-05-1945 Gender: Male Account #: [id number] Procedure:                Upper GI endoscopy Indications:              Dysphagia, history of tonsillar cancer s/p XRT,                            barium esophagram with tablet showed transient                            delay of tablet in mid and distal esophagus Medicines:                Monitored Anesthesia Care Procedure:                Pre-Anesthesia Assessment:                           - Prior to the procedure, a History and Physical                            was performed, and patient medications and                            allergies were reviewed. The patient's tolerance of                            previous anesthesia was also reviewed. The risks                            and benefits of the procedure and the sedation                            options and risks were discussed with the patient.                            All questions were answered, and informed consent                            was obtained. Prior Anticoagulants: The patient has                            taken no previous anticoagulant or antiplatelet                            agents. ASA Grade Assessment: III - A patient with                            severe systemic disease. After reviewing the risks                            and benefits, the patient was deemed in  satisfactory condition to undergo the procedure.                           After obtaining informed consent, the endoscope was                            passed under direct vision. Throughout the                            procedure, the patient's blood pressure, pulse, and                            oxygen saturations were monitored continuously. The                            Model GIF-HQ190  [id number]) scope was introduced                            through the mouth, and advanced to the second part                            of duodenum. The upper GI endoscopy was                            accomplished without difficulty. The patient                            tolerated the procedure well. Scope In: Scope Out: Findings:                 The examined esophagus was normal.                           No endoscopic abnormality was evident in the                            esophagus to explain the patient's complaint of                            dysphagia. It was decided, however, to proceed with                            dilation of the entire esophagus. A guidewire was                            placed and the scope was withdrawn. Dilation was                            performed with a Savary dilator with mild                            resistance at 15 mm.                           The cardia and gastric fundus  were normal on                            retroflexion.                           The entire examined stomach was normal.                           The examined duodenum was normal. Complications:            No immediate complications. Estimated Blood Loss:     Estimated blood loss: none. Impression:               - Normal esophageal mucosa.                           - No endoscopic esophageal abnormality to explain                            patient's dysphagia. Esophagus dilated.                           - Normal stomach.                           - Normal examined duodenum.                           - No specimens collected. Recommendation:           - Patient has a contact number available for                            emergencies. The signs and symptoms of potential                            delayed complications were discussed with the                            patient. Return to normal activities tomorrow.                            Written discharge  instructions were provided to the                            patient.                           - Resume previous diet.                           - Continue present medications.                           - Repeat upper endoscopy PRN. Jerene Bears, MD 04/06/2017 4:00:13 PM This report has been signed electronically.

## 2017-04-06 NOTE — Patient Instructions (Signed)
YOU HAD AN ENDOSCOPIC PROCEDURE TODAY AT [facility]:   Refer to the procedure report that was given to you for any specific questions about what was found during the examination.  If the procedure report does not answer your questions, please call your gastroenterologist to clarify.  If you requested that your care partner not be given the details of your procedure findings, then the procedure report has been included in a sealed envelope for you to review at your convenience later.  YOU SHOULD EXPECT: Some feelings of bloating in the abdomen. Passage of more gas than usual.  Walking can help get rid of the air that was put into your GI tract during the procedure and reduce the bloating. If you had a lower endoscopy (such as a colonoscopy or flexible sigmoidoscopy) you may notice spotting of blood in your stool or on the toilet paper. If you underwent a bowel prep for your procedure, you may not have a normal bowel movement for a few days.  Please Note:  You might notice some irritation and congestion in your nose or some drainage.  This is from the oxygen used during your procedure.  There is no need for concern and it should clear up in a day or so.  SYMPTOMS TO REPORT IMMEDIATELY:    Following upper endoscopy (EGD)  Vomiting of blood or coffee ground material  New chest pain or pain under the shoulder blades  Painful or persistently difficult swallowing  New shortness of breath  Fever of 100F or higher  Black, tarry-looking stools  For urgent or emergent issues, a gastroenterologist can be reached at any hour by calling [phone].   DIET:  Please follow the dilatation diet the rest of today.  Handout was given to your wife.  Drink plenty of fluids but you should avoid alcoholic beverages for 24 hours.  ACTIVITY:  You should plan to take it easy for the rest of today and you should NOT DRIVE or use heavy machinery until tomorrow (because of the sedation medicines used  during the test).    FOLLOW UP: Our staff will call the number listed on your records the next business day following your procedure to check on you and address any questions or concerns that you may have regarding the information given to you following your procedure. If we do not reach you, we will leave a message.  However, if you are feeling well and you are not experiencing any problems, there is no need to return our call.  We will assume that you have returned to your regular daily activities without incident.  If any biopsies were taken you will be contacted by phone or by letter within the next 1-3 weeks.  Please call us at [phone] if you have not heard about the biopsies in 3 weeks.    SIGNATURES/CONFIDENTIALITY: You and/or your care partner have signed paperwork which will be entered into your electronic medical record.  These signatures attest to the fact that that the information above on your After Visit Summary has been reviewed and is understood.  Full responsibility of the confidentiality of this discharge information lies with you and/or your care-partner.    Handout was given to your care partner on the esophageal dilatation diet to follow the rest of tonight. You may resume your current medications today. Your blood sugar was 81 in the recovery room. Please call if any questions or concerns.

## 2017-04-06 NOTE — Progress Notes (Signed)
Pt's states no medical or surgical changes since previsit or office visit. 

## 2017-04-06 NOTE — Telephone Encounter (Signed)
Spoke with wife- he ate at 9:00 a.m this and hasn't had solid food since.  I explained we need to wait 6 hours after last solid food for EGD- he is instructed to arrive at 2:30 for a 3:30 p.m. Procedure.  NPO after 12:30 p.m.   Understanding voiced

## 2017-04-06 NOTE — Progress Notes (Signed)
No problems noted in the recovery room. maw 

## 2017-04-06 NOTE — Progress Notes (Signed)
Called to room to assist during endoscopic procedure.  Patient ID and intended procedure confirmed with present staff. Received instructions for my participation in the procedure from the performing physician.  

## 2017-04-07 ENCOUNTER — Telehealth: Payer: Self-pay | Admitting: *Deleted

## 2017-04-07 NOTE — Telephone Encounter (Signed)
  Follow up Call-  Call back number 04/06/2017 04/01/2015  Post procedure Call Back phone  # [phone]  Permission to leave phone message Yes Yes  Some recent data might be hidden     Patient questions:  Do you have a fever, pain , or abdominal swelling? No. Pain Score  0 *  Have you tolerated food without any problems? Yes.    Have you been able to return to your normal activities? No.  Do you have any questions about your discharge instructions: Diet   No. Medications  No. Follow up visit  No.  Do you have questions or concerns about your Care? No.  Actions: * If pain score is 4 or above: No action needed, pain <4.

## 2017-04-09 MED FILL — fentaNYL 50 MCG/HR PT72: 50 | 30 days supply | Qty: 10 | Fill #0

## 2017-04-20 DIAGNOSIS — R69 Illness, unspecified: Secondary | ICD-10-CM | POA: Diagnosis not present

## 2017-04-27 DIAGNOSIS — R69 Illness, unspecified: Secondary | ICD-10-CM | POA: Diagnosis not present

## 2017-04-28 DIAGNOSIS — R52 Pain, unspecified: Secondary | ICD-10-CM | POA: Diagnosis not present

## 2017-04-28 DIAGNOSIS — Y842 Radiological procedure and radiotherapy as the cause of abnormal reaction of the patient, or of later complication, without mention of misadventure at the time of the procedure: Secondary | ICD-10-CM | POA: Diagnosis not present

## 2017-04-30 MED FILL — oxyCODONE HCL 5 MG/5ML SOLN: 5 | 22 days supply | Qty: 2000 | Fill #0

## 2017-05-11 ENCOUNTER — Other Ambulatory Visit: Payer: Self-pay

## 2017-05-11 MED FILL — fentaNYL 50 MCG/HR PT72: 50 | 30 days supply | Qty: 10 | Fill #0

## 2017-05-13 ENCOUNTER — Encounter: Payer: Self-pay | Admitting: Adult Health

## 2017-05-13 ENCOUNTER — Telehealth: Payer: Self-pay | Admitting: Internal Medicine

## 2017-05-13 ENCOUNTER — Ambulatory Visit: Payer: Self-pay | Admitting: Internal Medicine

## 2017-05-26 DIAGNOSIS — I10 Essential (primary) hypertension: Secondary | ICD-10-CM | POA: Diagnosis not present

## 2017-05-26 DIAGNOSIS — R52 Pain, unspecified: Secondary | ICD-10-CM | POA: Diagnosis not present

## 2017-05-26 DIAGNOSIS — Y842 Radiological procedure and radiotherapy as the cause of abnormal reaction of the patient, or of later complication, without mention of misadventure at the time of the procedure: Secondary | ICD-10-CM | POA: Diagnosis not present

## 2017-05-28 IMAGING — CT CT NECK W/ CM
3 of 5 series · 13 of 33 positions shown, 16 images · IV contrast (omnipaque)
Comparison: 11/06/2014 neck CT.  10/29/2014 PET-CT.

CLINICAL DATA: Right palatine tonsil cancer diagnosed in October 2014 status post ongoing radiation therapy. Patient presents with
nausea, vomiting, hemoptysis and dysphagia.

EXAM:
CT NECK WITH CONTRAST
TECHNIQUE: Multidetector CT imaging of the neck was performed using the
standard protocol following the bolus administration of intravenous
contrast.
CONTRAST:  100mL OMNIPAQUE IOHEXOL 300 MG/ML  SOLN

[Series 6: coronal st · coronal · 0.41mm/px · 3 of 109 slices shown]
[im 31/109  bone]
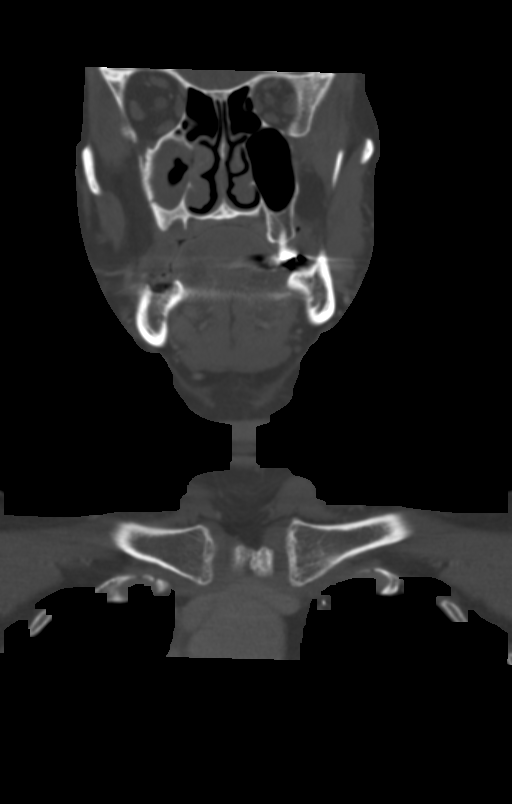
[im 47/109  bone]
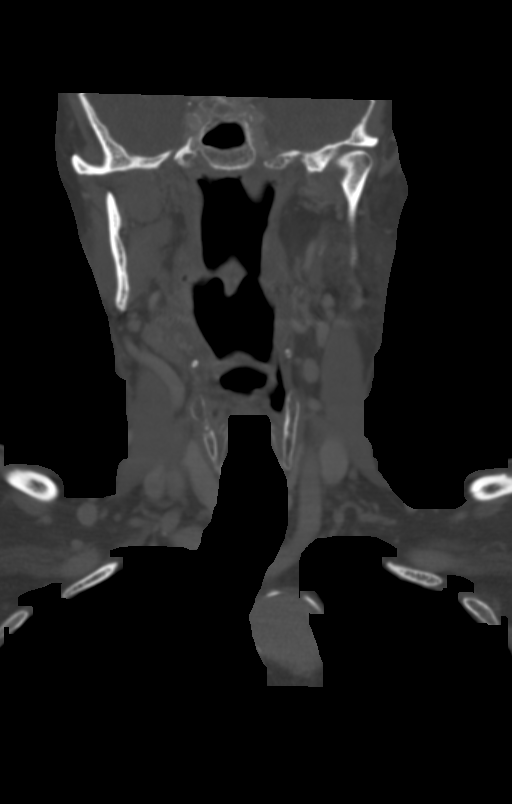
[im 62/109  bone]
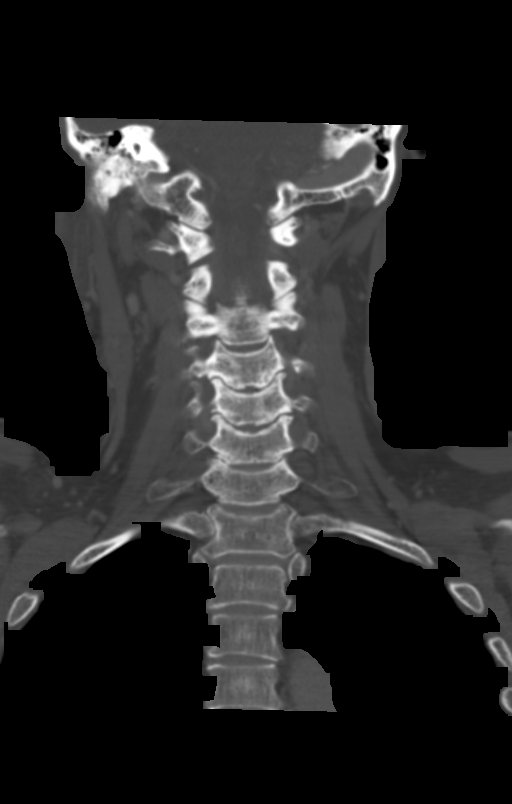

[Series 7: sagittal st · sagittal · 0.49mm/px · 5 of 108 slices shown, 6 images]
[im 36/108  bone]
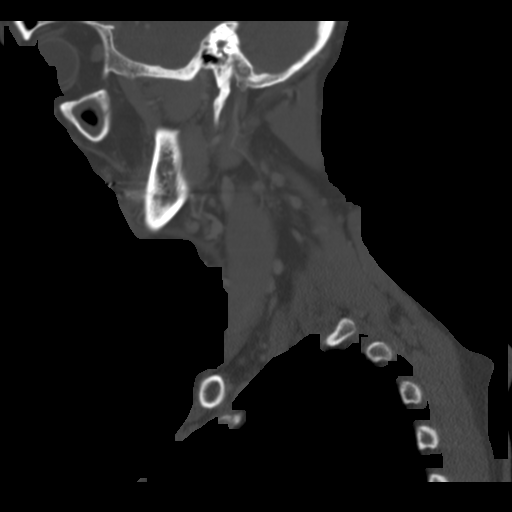
[im 45/108  bone]
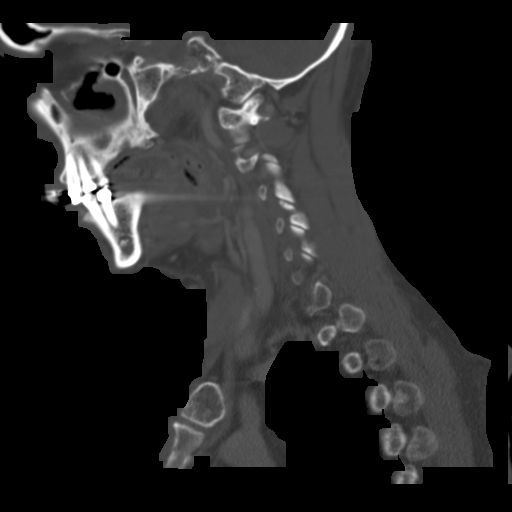
[im 54/108  soft-tissue]
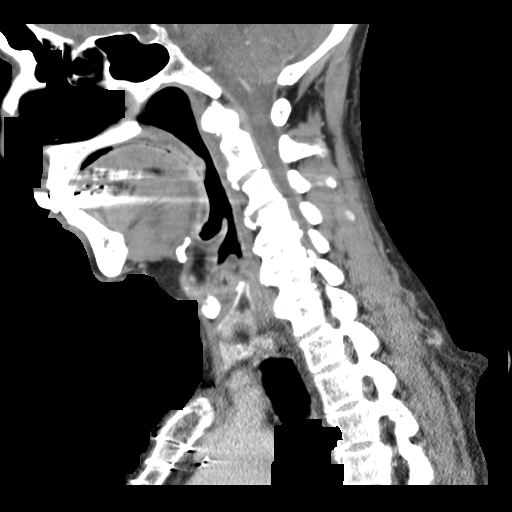
[im 54/108  bone]
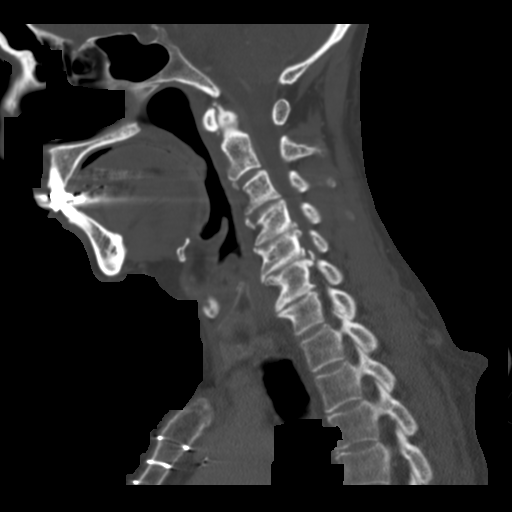
[im 63/108  bone]
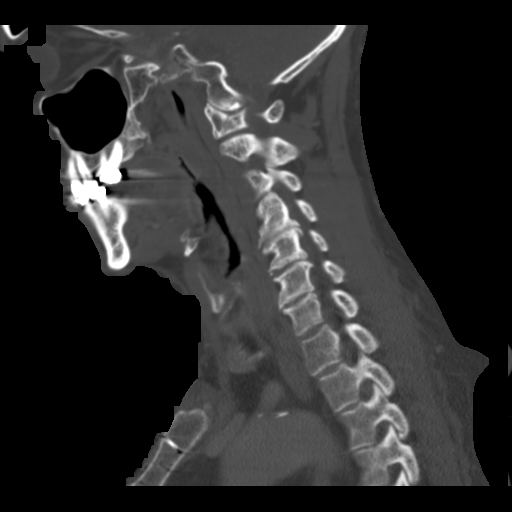
[im 72/108  bone]
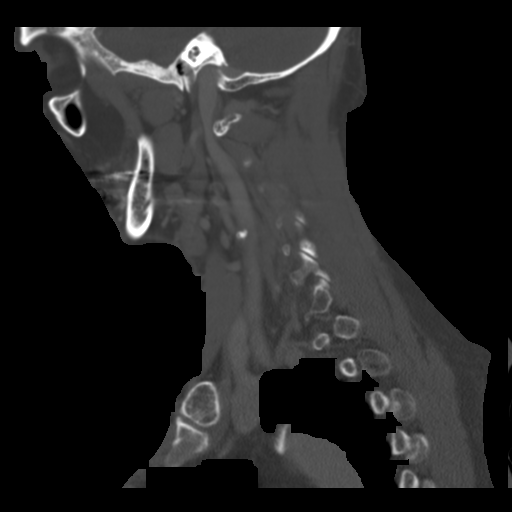

[Series 8: axial recons · axial · 0.39mm/px · z∈[-265,-74]mm · 5 of 151 slices shown, 7 images]
[im 26/151  soft-tissue]
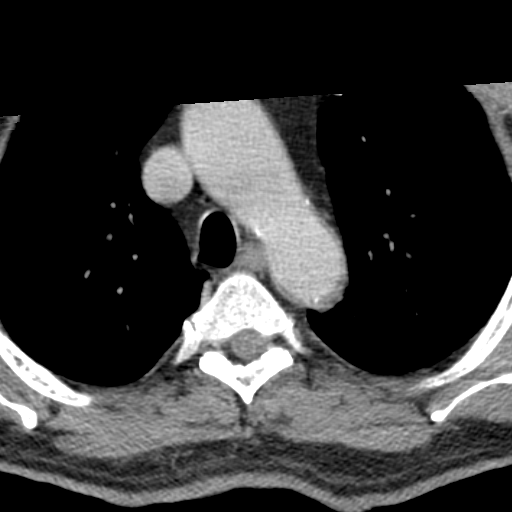
[im 26/151  bone]
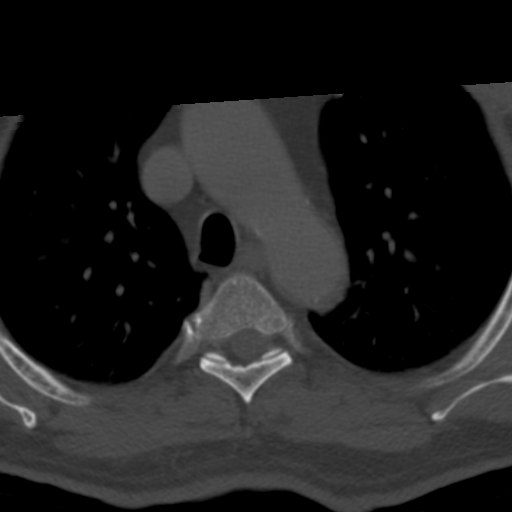
[im 51/151  bone]
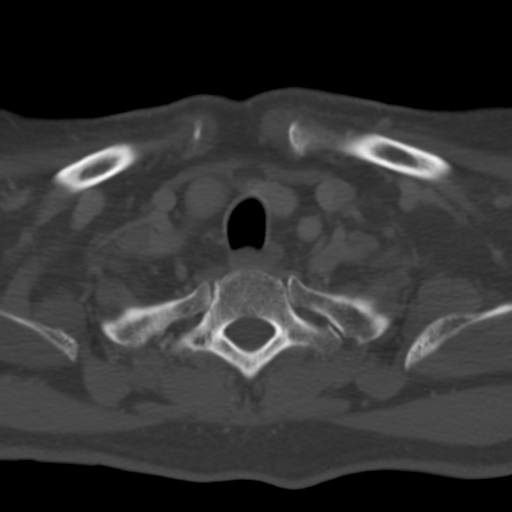
[im 76/151  bone]
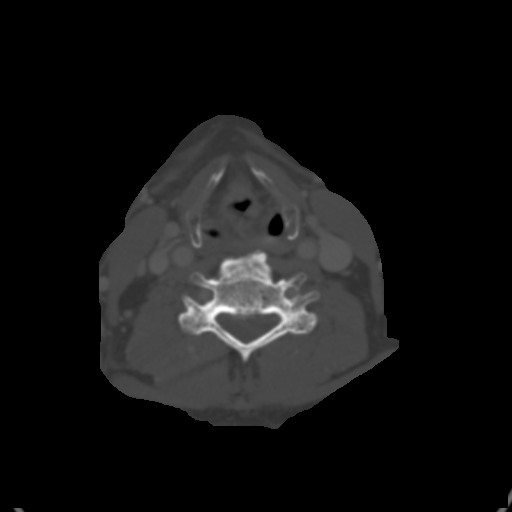
[im 101/151  bone]
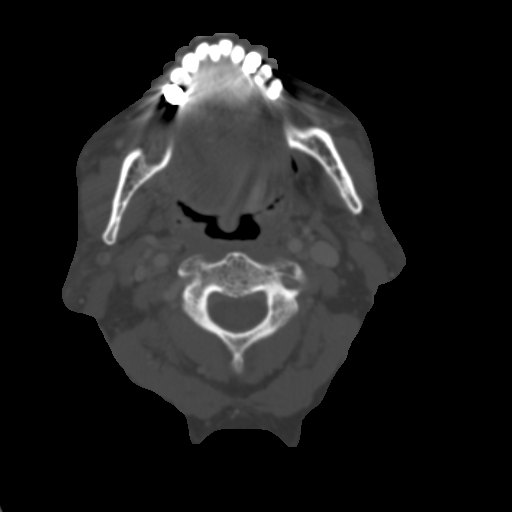
[im 126/151  soft-tissue]
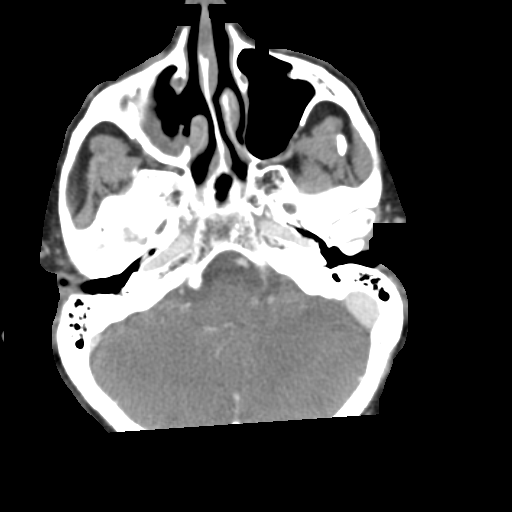
[im 126/151  bone]
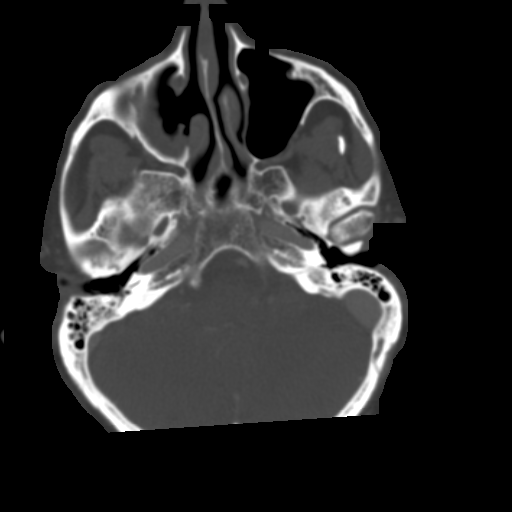

[13 of 33 positions shown; findings below may reference images not displayed]

FINDINGS: Pharynx and larynx: There has been a significant interval treatment
response in the right palatine tonsil tumor, with no residual
measurable right palatine tonsil mass. Mild mucosal thickening and
hyperenhancement in the bilateral oropharynx and epiglottis,
slightly asymmetric to the right, is likely treatment related.
Unremarkable nasopharynx. No laryngeal mass. No retropharyngeal or
peritonsillar fluid collection.

Salivary glands: Unremarkable .

Thyroid: Unremarkable.  No thyroid nodules.

Lymph nodes: No pathologically enlarged cervical nodes.

Vascular: Intracranial and extracranial carotid atherosclerosis
bilaterally, with no significant proximal internal carotid artery
stenoses.

Limited intracranial: No acute abnormality.

Visualized orbits: Intact .

Mastoids and visualized paranasal sinuses: Prominent mucosal
thickening throughout the right maxillary sinus with decreased
opacification of the right maxillary sinus. Status post right
maxillary antrostomy. Otherwise essentially clear visualized
paranasal sinuses. Mild partial opacification of the visualized
right mastoid air cells. On opacified visualized left mastoid air
cells.

Skeleton: No aggressive appearing focal osseous lesions. Visualized
median sternotomy wires are intact.

Upper chest: No acute consolidative airspace disease, significant
pulmonary nodules or lung masses.
IMPRESSION: 1. Significant interval treatment response with no residual
measurable right palatine tonsil mass.
2. Mild mucosal thickening and hyperenhancement in the bilateral
oropharynx and epiglottis, slightly asymmetric to the right, likely
treatment related. No retropharyngeal or peritonsillar fluid
collection.
3. No cervical adenopathy.
4. Persistent prominent right maxillary sinus mucosal thickening
with decreased right maxillary sinus opacification. Status post
right maxillary antrostomy.
5. New mild partial opacification of the right mastoid air cells,
nonspecific.

## 2017-05-31 MED FILL — oxyCODONE HCL 5 MG/5ML SOLN: 5 | 22 days supply | Qty: 2000 | Fill #0

## 2017-06-10 MED FILL — fentaNYL 50 MCG/HR PT72: 50 | 30 days supply | Qty: 10 | Fill #0

## 2017-06-16 ENCOUNTER — Inpatient Hospital Stay (HOSPITAL_COMMUNITY): Payer: Medicare HMO | Attending: Adult Health | Admitting: Adult Health

## 2017-06-16 ENCOUNTER — Encounter (HOSPITAL_COMMUNITY): Payer: Self-pay | Admitting: Adult Health

## 2017-06-16 DIAGNOSIS — Z79899 Other long term (current) drug therapy: Secondary | ICD-10-CM | POA: Insufficient documentation

## 2017-06-16 DIAGNOSIS — M109 Gout, unspecified: Secondary | ICD-10-CM | POA: Diagnosis not present

## 2017-06-16 DIAGNOSIS — Z7984 Long term (current) use of oral hypoglycemic drugs: Secondary | ICD-10-CM | POA: Diagnosis not present

## 2017-06-16 DIAGNOSIS — K219 Gastro-esophageal reflux disease without esophagitis: Secondary | ICD-10-CM | POA: Insufficient documentation

## 2017-06-16 DIAGNOSIS — C099 Malignant neoplasm of tonsil, unspecified: Secondary | ICD-10-CM | POA: Diagnosis not present

## 2017-06-16 DIAGNOSIS — R5383 Other fatigue: Secondary | ICD-10-CM

## 2017-06-16 DIAGNOSIS — E039 Hypothyroidism, unspecified: Secondary | ICD-10-CM | POA: Diagnosis not present

## 2017-06-16 DIAGNOSIS — Z8719 Personal history of other diseases of the digestive system: Secondary | ICD-10-CM | POA: Insufficient documentation

## 2017-06-16 DIAGNOSIS — I2510 Atherosclerotic heart disease of native coronary artery without angina pectoris: Secondary | ICD-10-CM | POA: Insufficient documentation

## 2017-06-16 DIAGNOSIS — Z87442 Personal history of urinary calculi: Secondary | ICD-10-CM | POA: Insufficient documentation

## 2017-06-16 DIAGNOSIS — R070 Pain in throat: Secondary | ICD-10-CM

## 2017-06-16 DIAGNOSIS — G253 Myoclonus: Secondary | ICD-10-CM | POA: Insufficient documentation

## 2017-06-16 DIAGNOSIS — Z86010 Personal history of colonic polyps: Secondary | ICD-10-CM | POA: Insufficient documentation

## 2017-06-16 DIAGNOSIS — Z923 Personal history of irradiation: Secondary | ICD-10-CM | POA: Diagnosis not present

## 2017-06-16 DIAGNOSIS — Z7982 Long term (current) use of aspirin: Secondary | ICD-10-CM | POA: Diagnosis not present

## 2017-06-16 DIAGNOSIS — F329 Major depressive disorder, single episode, unspecified: Secondary | ICD-10-CM

## 2017-06-16 DIAGNOSIS — E785 Hyperlipidemia, unspecified: Secondary | ICD-10-CM | POA: Diagnosis not present

## 2017-06-16 DIAGNOSIS — M069 Rheumatoid arthritis, unspecified: Secondary | ICD-10-CM | POA: Diagnosis not present

## 2017-06-16 DIAGNOSIS — E1142 Type 2 diabetes mellitus with diabetic polyneuropathy: Secondary | ICD-10-CM

## 2017-06-16 DIAGNOSIS — I10 Essential (primary) hypertension: Secondary | ICD-10-CM | POA: Insufficient documentation

## 2017-06-16 DIAGNOSIS — R69 Illness, unspecified: Secondary | ICD-10-CM | POA: Diagnosis not present

## 2017-06-16 MED ORDER — OXYCODONE HCL 5 MG/5ML PO SOLN: 10.0000 mg | ORAL | 0 refills | Status: DC | PRN

## 2017-06-16 MED ORDER — FENTANYL 50 MCG/HR TD PT72: 50.0000 ug | MEDICATED_PATCH | TRANSDERMAL | 0 refills | Status: DC

## 2017-06-16 NOTE — Patient Instructions (Signed)
[facility] Discharge Instructions  You saw Gretchen Dawson, NP, today See Amy at checkout for appointments.    Thank you for choosing [facility] at [facility] to provide your oncology and hematology care.  To afford each patient quality time with our provider, please arrive at least 15 minutes before your scheduled appointment time.   If you have a lab appointment with the Cancer Center please come in thru the  Main Entrance and check in at the main information desk  You need to re-schedule your appointment should you arrive 10 or more minutes late.  We strive to give you quality time with our providers, and arriving late affects you and other patients whose appointments are after yours.  Also, if you no show three or more times for appointments you may be dismissed from the clinic at the providers discretion.     Again, thank you for choosing [facility].  Our hope is that these requests will decrease the amount of time that you wait before being seen by our physicians.       _____________________________________________________________  Should you have questions after your visit to  Cancer Center, please contact our office at [phone] between the hours of 8:30 a.m. and 4:30 p.m.  Voicemails left after 4:30 p.m. will not be returned until the following business day.  For prescription refill requests, have your pharmacy contact our office.       Resources For Cancer Patients and their Caregivers ? [employer]: Can assist with transportation, wigs, general needs, runs Look Good Feel Better.        1-888-227-6333 ? Cancer Care: Provides financial assistance, online support groups, medication/co-pay assistance.  1-800-813-HOPE (4673) ? [facility] Assists [place] cancer patients and their families through emotional , educational and financial support.   336-427-4357 ? [place] DSS Where to apply for food stamps, Medicaid and utility assistance. 336-342-1394 ? [facility]: Transportation to medical appointments. 336-347-2287 ? Social Security Administration: May apply for disability if have a Stage IV cancer. 336-342-7796 1-800-772-1213 ? [place] [facility] Transit [facility]: Assists with nutrition, care and transit needs. 336-349-2343  Cancer Center Support Programs:   > Cancer Support Group  2nd Tuesday of the month 1pm-2pm, Journey Room   > Creative Journey  3rd Tuesday of the month 1130am-1pm, Journey Room     

## 2017-06-16 NOTE — Progress Notes (Signed)
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      10/12/2014 Pathology Results    Accession: [id number]  right tonsil biopsy confirmed squamous cell carcinoma, HPV positive      10/12/2014 Procedure     he underwent biopsy of right tonsil      10/29/2014 Imaging     PET/CT scan showed 3.2 cm hypermetabolic mass in the right palatine tonsil, consistent with known tonsillar carcinoma.       11/06/2014 Imaging    CT neck: Right palatine tonsil mass measuring 2.1 x 2.1 x 2.5 cm.  Asymmetric right LNs in level 1B, 2, and level 3.       12/03/2014 - 01/18/2015 Radiation Therapy    Right tonsil and bilateral neck / 70 Gy in 35 fractions to gross disease, 63 Gy in 35 fractions to high risk nodal echelons, and 56 Gy in 35 fractions to intermediate risk nodal echelons      01/06/2015 Miscellaneous    ER VISIT for dysphagia.  No hospital admission. CT neck done and pt was able to tolerate po liquids afer IV zofran. Discharged home.       01/06/2015 Imaging    CT neck: IMPRESSION: Significant interval treatment response with no residual measurable right tonsil mass. Mild mucosal thickening and hyperenhancement in bilateral oropharynx & epiglottis, likely tx effect. No cervical adenpathy.      04/25/2015 Procedure    PEG placement       05/16/2015 PET scan    Restaging PET scan: Interval response to therapy. Resolution  of hypermetabolism associated with right tonsillar lesion. No evidence of residual or recurrence of tumor or metastatic disease.       03/23/2016 Imaging    CT neck for persistent pain: IMPRESSION: Acute RIGHT submandibular sialoadenitis, RIGHT neck inflammation and pharyngitis. Findings may be secondary to radiation or, infectious.  No convincing evidence of residual nor recurrent tumor.      03/23/2016 Imaging    CT chest/abd for cough/N&V: IMPRESSION: 1. No findings of metastatic disease to the chest or upper abdomen. 2. 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 Recommend annual imaging followup by CTA or MRA. This recommendation follows 2010 ACCF/AHA/AATS/ACR/ASA/SCA/SCAI/SIR/STS/SVM Guidelines for the Diagnosis and Management of Patients with Thoracic Aortic Disease. Circulation. 2010; 121: H212-Y482 3. Other imaging findings of potential clinical significance: Pneumobilia, query prior sphincterotomy. Coronary, aortic arch, and branch vessel atherosclerotic vascular disease. Aortoiliac atherosclerotic vascular disease. Foraminal impingement at L4-5 and L5-S1 due to spondylosis and degenerative disc disease. Bilateral renal cysts.      06/25/2016 Procedure    PEG tube removed with IR.         INTERVAL HISTORY:  [name] 72 y.o. male presents to [facility] after transferring his medical oncology care here since this provider's recent relocation to this office.  (He is my  previous patient treated in [place] at [facility]).  He is here for routine follow-up for h/o Stage II right tonsil cancer.   Here today with his wife.   Since his last visit to the cancer center, he saw Kevin Mcclain with GI and had EGD with esophageal dilation. Chart reviewed; there was no reported abnormal findings on the patient's endoscopic exam.   Overall, he tells me he has been feeling "okay."  Continues to report intermittent problems swallowing. Appetite 50%; energy levels  25%. He continues to struggle with fatigue and feeling tired. "I just want to sleep a lot. I don't like to get my day started sometimes because I don't want to get up to face the pain."  Remains on Fentanyl patch with oxycodone soln for breakthrough pain; continues to require dose of oxycodone 10 mg approx every 4 hours.   Reports that his bowels are moving well.    His mouth stays very dry, which worsens his pain.  He has occasional N&V, which he manages with PRN anti-emetics, "but it's not too bad. 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  Endorses easy bleeding/bruising, which is chronic and stable.   He tells me that he has seen Kevin Mcclain at [facility] re: his pain for a couple of visits.  He was started on a medication (he cannot recall the name), but he had to stop it because he had hallucinations.  He remains on his Fentanyl and Oxycodone.    Now on Zoloft for his depression. Reports that he has "a lot of stuff I want to do in my mind, but I don't want to get up and do any of it."  His PCP is managing his depression/mood.    Continues to follow-up with Kevin Mcclain with ENT as directed.     REVIEW OF SYSTEMS:  Review of Systems - Oncologyper HPI. 14-point ROS completed and negative except as stated above.    PAST MEDICAL/SURGICAL HISTORY:  Past Medical History:  Diagnosis Date  . Allergy   . Arthritis   . CAD (coronary artery disease)    severe CAD-s/p MI 2000 with PTCA, s/p CABG 2005-by Dr Kevin Mcclain at [facility] EF 50-55%  . Cancer ([place]) 10/12/2014   right tonsil=squamous cell  carcinaoma  . Diabetes mellitus type II   . Diverticulosis   . Gastropathy 2017   reactive  . GERD (gastroesophageal reflux disease)    zantac- prn  . Gout   . History of radiation therapy 12/03/14- 01/18/15   Right Tonsil and Bilateral neck.   . Hyperlipidemia   .  Hyperplastic colon polyp   . Hypertension   . Kidney stones   . Rheumatoid arthritis ([place])   . Shingles outbreak 04/23/15   left shoulder and left arm  . Vertigo    Past Surgical History:  Procedure Laterality Date  . CHOLECYSTECTOMY  2012  . COLONOSCOPY W/ POLYPECTOMY  2015  . CORONARY ARTERY BYPASS GRAFT  2005   6 grafts  . CYSTOSCOPY     kidney stone removal  . IR GASTROSTOMY TUBE REMOVAL  06/25/2016  . LITHOTRIPSY    . MULTIPLE EXTRACTIONS WITH ALVEOLOPLASTY N/A 11/14/2014   Procedure: Extraction of tooth #2 and 31 with alveoloplasty after sectioning of bridge at distal #29 with full mouth debridement of remaining dention;  Surgeon: Lenn Cal, DDS;  Location: [facility];  Service: Oral Surgery;  Laterality: N/A;  . NASAL SINUS SURGERY Right 11/14/2014   Procedure: RIGHT ENDOSCOPIC ANTROSTOMY AND ANTERIOR ETHMOIDECTOMY ;  Surgeon: Jodi Marble, MD;  Location: [facility];  Service: ENT;  Laterality: Right;     SOCIAL HISTORY:  Social History   Socioeconomic History  . Marital status: Married    Spouse name: Not on file  . Number of children: Not on file  . Years of education: Not on file  . Highest education level: Not on file  Occupational History  . Not on file  Social Needs  . Financial resource strain: Not on file  . Food insecurity:    Worry: Not on file    Inability: Not on file  . Transportation needs:    Medical: Not on file    Non-medical: Not on file  Tobacco Use  . Smoking status: Never Smoker  . Smokeless tobacco: Never Used  Substance and Sexual Activity  . Alcohol use: No  . Drug use: No  . Sexual activity: Not on file  Lifestyle  . Physical activity:    Days per week: Not on file    Minutes per session: Not on file  . Stress: Not on file  Relationships  . Social connections:    Talks on phone: Not on file    Gets together: Not on file    Attends religious service: Not on file    Active member of club or organization: Not on file    Attends  meetings of clubs or organizations: Not on file    Relationship status: Not on file  . Intimate partner violence:    Fear of current or ex partner: Not on file    Emotionally abused: Not on file    Physically abused: Not on file    Forced sexual activity: Not on file  Other Topics Concern  . Not on file  Social History Narrative   Married 35 years in October 2017.       Occupation :  Retired Arboriculturist for [employer].  Worked for [employer]: garden and work in yard    FAMILY HISTORY:  Family History  Problem Relation Age of Onset  . Heart attack Mother        deceased age 75 secondary  . Colon cancer Father   . Stroke Father        deceased age 71  . Heart disease Father        heart attack  . Stroke Sister 1  . Coronary artery disease Brother 57       s/p bypass surgery /stent    CURRENT MEDICATIONS:  Outpatient Encounter Medications as of 06/16/2017  Medication Sig Note  . aspirin 81 MG tablet Take 81 mg by mouth daily. Does not take everyday   . atorvastatin (LIPITOR) 10 MG tablet Place 1 tablet (10 mg total) into feeding tube daily.   . carbamazepine (TEGRETOL XR) 100 MG 12 hr tablet Take 100 mg by mouth at bedtime.   Derrill Memo ON 09/08/2017] fentaNYL (DURAGESIC - DOSED MCG/HR) 50 MCG/HR Place 1 patch (50 mcg total) onto the skin every 3 (three) days.   Marland Kitchen levETIRAcetam (KEPPRA) 250 MG tablet Take 250 mg by mouth 2 (two) times daily.   Marland Kitchen levothyroxine (SYNTHROID, LEVOTHROID) 75 MCG tablet Take 1 tablet (75 mcg total) by mouth daily.   . metFORMIN (GLUCOPHAGE) 500 MG tablet Take 1 tablet (500 mg total) by  mouth daily with breakfast.   . metoprolol succinate (TOPROL-XL) 25 MG 24 hr tablet Take 1 tablet (25 mg total) daily by mouth. Take with or immediately following a meal.   . nitroGLYCERIN (NITROSTAT) 0.4 MG SL tablet Place 1 tablet (0.4 mg total) under the tongue every 5 (five) minutes as needed. 03/23/2017: On hand  . OVER THE COUNTER  MEDICATION Place 1 drop into both eyes 3 (three) times daily as needed (dry eyes/ irritation). Clear Cooling Eye Drops   . [START ON 08/29/2017] oxyCODONE (ROXICODONE) 5 MG/5ML solution Take 10-15 mLs (10-15 mg total) by mouth every 4 (four) hours as needed for severe pain.   Marland Kitchen prochlorperazine (COMPAZINE) 10 MG tablet Take 1 tablet (10 mg total) by mouth every 6 (six) hours as needed for nausea or vomiting.   . ramipril (ALTACE) 5 MG capsule Take 2 pills in the morning, Take 1 pill in the evening   . sertraline (ZOLOFT) 100 MG tablet Take 1 tablet (100 mg total) by mouth daily.   . sodium fluoride (PREVIDENT 5000 PLUS) 1.1 % CREA dental cream Apply to tooth brush. Brush teeth for 2 minutes. Spit out excess-DO NOT swallow. Repeat nightly.   . [DISCONTINUED] fentaNYL (DURAGESIC - DOSED MCG/HR) 50 MCG/HR Place 1 patch (50 mcg total) onto the skin every 3 (three) days.   . [DISCONTINUED] fentaNYL (DURAGESIC - DOSED MCG/HR) 50 MCG/HR Place 1 patch (50 mcg total) onto the skin every 3 (three) days.   . [DISCONTINUED] fentaNYL (DURAGESIC - DOSED MCG/HR) 50 MCG/HR Place 1 patch (50 mcg total) onto the skin every 3 (three) days.   . [DISCONTINUED] oxyCODONE (ROXICODONE) 5 MG/5ML solution Take 10-15 mLs (10-15 mg total) by mouth every 4 (four) hours as needed for severe pain.   . [DISCONTINUED] oxyCODONE (ROXICODONE) 5 MG/5ML solution Take 10-15 mLs (10-15 mg total) by mouth every 4 (four) hours as needed for severe pain.   . [DISCONTINUED] oxyCODONE (ROXICODONE) 5 MG/5ML solution Take 10-15 mLs (10-15 mg total) by mouth every 4 (four) hours as needed for severe pain.   . [DISCONTINUED] escitalopram (LEXAPRO) 10 MG tablet Take 1 tablet (10 mg total) by mouth daily. (Patient not taking: Reported on 06/16/2017)   . [DISCONTINUED] ondansetron (ZOFRAN) 4 MG tablet TAKE 1 TABLET (4 MG TOTAL) BY MOUTH EVERY 8 (EIGHT) HOURS AS NEEDED FOR NAUSEA OR VOMITING.    Facility-Administered Encounter Medications as of 06/16/2017   Medication  . 0.9 %  sodium chloride infusion    ALLERGIES:  Allergies  Allergen Reactions  . Benzonatate Swelling    "swells my throat"  . Codeine Itching  . Sitagliptin Phosphate Itching     PHYSICAL EXAM:  ECOG Performance status: 1 - Symptomatic, but independent  Vitals:   06/16/17 1408  BP: (!) 133/50  Pulse: (!) 54  Resp: 16  Temp: (!) 97.2 F (36.2 C)  SpO2: 99%   Filed Weights   06/16/17 1408  Weight: 129 lb 3.2 oz (58.6 kg)    Physical Exam  Constitutional: He is oriented to person, place, and time.  Thin male in no acute distress  HENT:  Head: Normocephalic.  Mouth/Throat: Oropharynx is clear and moist. No oropharyngeal exudate.  Limited jaw ROM (chronic)  Eyes: Pupils are equal, round, and reactive to light. Conjunctivae are normal. No scleral icterus.  Neck: Normal range of motion. Neck supple.  Cardiovascular: Normal rate and regular rhythm.  Pulmonary/Chest: Effort normal and breath sounds normal. No respiratory distress. He has no wheezes.  Abdominal: Soft. Bowel sounds are normal. There is no tenderness.  Musculoskeletal: Normal range of motion. He exhibits no edema.  Lymphadenopathy:    He has no cervical adenopathy.       Right: No supraclavicular adenopathy present.       Left: No supraclavicular adenopathy present.  Neurological: He is alert and oriented to person, place, and time. No cranial nerve deficit. Gait normal.  Skin: Skin is warm and dry. No rash noted.  Psychiatric: Mood, memory, affect and judgment normal.  Nursing note and vitals reviewed.    LABORATORY DATA:  I have reviewed the labs as listed.  CBC    Component Value Date/Time   WBC 5.5 03/16/2017 1323   RBC 4.66 03/16/2017 1323   HGB 13.6 03/16/2017 1323   HGB 13.3 04/23/2015 1102   HCT 40.9 03/16/2017 1323   HCT 37.6 (L) 04/23/2015 1102   PLT 172 03/16/2017 1323   PLT 163 04/23/2015 1102   MCV 87.8 03/16/2017 1323   MCV 89.3 04/23/2015 1102   MCH 29.2  03/16/2017 1323   MCHC 33.3 03/16/2017 1323   RDW 13.2 03/16/2017 1323   RDW 13.2 04/23/2015 1102   LYMPHSABS 1.3 03/16/2017 1323   LYMPHSABS 0.4 (L) 04/23/2015 1102   MONOABS 0.4 03/16/2017 1323   MONOABS 0.6 04/23/2015 1102   EOSABS 0.3 03/16/2017 1323   EOSABS 0.1 04/23/2015 1102   BASOSABS 0.1 03/16/2017 1323   BASOSABS 0.0 04/23/2015 1102   CMP Latest Ref Rng & Units 03/16/2017 10/30/2016 06/29/2016  Glucose 65 - 99 mg/dL 106(H) 104(H) 88  BUN 6 - 20 mg/dL '11 9 10  ' Creatinine 0.61 - 1.24 mg/dL 1.00 0.91 0.82  Sodium 135 - 145 mmol/L 138 134(L) 139  Potassium 3.5 - 5.1 mmol/L 3.8 4.6 4.0  Chloride 101 - 111 mmol/L 101 97 102  CO2 22 - 32 mmol/L '29 31 31  ' Calcium 8.9 - 10.3 mg/dL 9.5 9.4 9.4  Total Protein 6.5 - 8.1 g/dL 7.0 6.2 6.4  Total Bilirubin 0.3 - 1.2 mg/dL 1.2 0.7 0.6  Alkaline Phos 38 - 126 U/L 54 65 73  AST 15 - 41 U/L '22 22 21  ' ALT 17 - 63 U/L '20 20 18    ' PENDING LABS:    DIAGNOSTIC IMAGING:  CT neck: 03/23/16     PATHOLOGY:  (R) tonsil biopsy: 10/12/14            ASSESSMENT & PLAN:   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 s/p definitive treatment with radiation alone; completed treatment on 01/18/15.  -Remains without clinical evidence of recurrence, which is excellent. His risk of recurrence at this point is much lower.  Continues to see Kevin Mcclain with ENT as directed.  -Clinically, remains NED today.   -Reinforced the importance of adequate dental follow-up (which he is already doing).   -Shared with Mr. Chopin and his wife that I am leaving [place] and would like to transition his care back to Dr. Alvy Bimler at [facility].  They are agreeable to this transition, as they have met Dr. Alvy Bimler in the past.   -I will help make arrangements for Dr. Alvy Bimler to see the patient in ~2 months to re-establish care. Patient is concerned about ensuring his pain medication prescriptions are taken  care of, which I will help facilitate today (see below).   -Continue follow-up with ENT as directed as well.   Hypothyroidism:  -Last TSH was normal at 3.05  on 10/30/16. Currently on Synthroid 50 mcg daily. His hypothyroidism is managed by his PCP. -Maintain follow-up with PCP as directed.   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 May have an element of neuropathic pain as well, given his persistence of symptoms.  I have low clinical suspicion that his continued pain is d/t cancer recurrence, but rather continued long-term side effects of his cancer treatments.   -Current pain regimen with 50 mcg Fentanyl patch and Oxycodone solution PRN has helped manage his pain somewhat. Historically, he did not tolerate gabapentin well during acute recovery from radiation.  He has also tried oxycodone tablets in the past, which caused N&V.  -At his last visit, I referred him to Kevin Mcclain with [facility] to see if there were any additional pain management approaches available to Mr. Theurer.  The patient/wife tell me that they have seen Kevin Mcclain a few times; I do not have these records available for review. I will ask nursing to help me get those records.  Patient remains currently on Fentanyl and oxycodone for his pain management.   -Endorses some symptoms concerning for mild opiate toxicity with occasional myoclonus/"muscle jerking", fatigue, and occasional N&V.  The myoclonus is reportedly worse at night for the patient. He also has fatigue, which is often multifactorial, but opiates may be contributing.  We discussed the option of opiate rotation with a different pain medication at different dosage.  Understandably, he is resistant to changing pain regimen given that he has tried several different pain medications in the past and did not tolerate them well.  His mental status and mood/affect today both appear normal; no observed myoclonic  episodes during our visit today.  His toxicity symptoms appear mild and I think we can continue current pain regimen at this time.   Shared with him that in my upcoming departure from [facility],  I would recommend he see Dr. Alvy Bimler, medical oncologist at [facility] for continued follow-up in ~2 months.  He understands that she may recommend a different pain regimen for him, which I reinforced may be more helpful overall in managing his pain.  He is open to the idea, but wants to wait until after he sees Dr. Alvy Bimler in a couple of months, which is reasonable.   -[place] Controlled Substance Reporting System reviewed and refills will be appropriate beginning in 06/2017.  32-months supply for Fentanyl patch and Oxycodone solution [facility].     *Fentanyl patch 50 mcg transdermal Q72H, #10; may fill on or after 07/10/17  *Fentanyl patch 50 mcg transdermal Q72H, #10; may fill on or after 08/09/17  *Fentanyl patch 50 mcg transdermal Q72H, #10; may fill on or after 09/08/17  *Oxycodone soln 543m5mL, take 10-1551mpo Q4Hprn, #2000m43may fill on or after 06/30/17  *Oxycodone soln 5mg/97m, take 10-15mg 22mQ4Hprn, #2000mL; 33mfill on or after 07/30/17  *Oxycodone soln 5mg/5mL28make 10-15mg  po82mprn, #2000mL; may88ml on or after 08/29/17      Dysphagia/Nausea:  -May be secondary to fibrosis s/p XRT for tonsil cancer.  -Underwent recent EGD with esophageal dilation with Dr. Pyrtle ([facility] Fredricksonontinue PRN anti-emetics as needed.    Depression and anxiety:  -Was started on Cymbalta, but did not tolerate well.  His depression and anxiety are currently being managed by his PCP.  Cymbalta was weaned off and Lexapro was started. He is no longer taking Lexapro.  Now  on Zoloft 100 mg daily.  He continues to express decreased motivation and persistent fatigue, which may be multifactorial.   -Have discussed option of referral to psychiatry in the past; reportedly  patient's wife contacted [facility] several times via phone but has not had a return call from them.  I think they may still be willing to see psychiatry and/or counselor, but did not push that issue again with them today.  As we have discussed at several past visits, many of the emotions he is experiencing are normal in cancer survivorship. Becoming acclimated to a "new normal" is difficult for many patients. Continuing to offer additional supportive therapy resources to patient will be important going forward.       Dispo:  -Continue to see Kevin Mcclain (ENT) as directed.  -Refer back to Dr. Alvy Bimler at [facility] in ~2 months to re-establish care.    All questions were answered to patient's stated satisfaction. Encouraged patient to call with any new concerns or questions before his next visit to the cancer center and we can certain see him sooner, if needed.      Orders placed this encounter:  No orders of the defined types were placed in this encounter.     [name], NP [place] [phone]

## 2017-06-17 ENCOUNTER — Other Ambulatory Visit (HOSPITAL_COMMUNITY): Payer: Self-pay

## 2017-06-17 ENCOUNTER — Other Ambulatory Visit: Payer: Self-pay | Admitting: Adult Health

## 2017-06-17 ENCOUNTER — Ambulatory Visit (HOSPITAL_COMMUNITY): Payer: Self-pay

## 2017-06-22 ENCOUNTER — Inpatient Hospital Stay: Payer: Medicare HMO | Attending: Adult Health

## 2017-06-22 NOTE — Progress Notes (Signed)
Case Discussed in [place] - Patient has complex pain issues following head and neck radiotherapy from fibrosis without evidence of recurrence - Discussed referral back to primary care in addition to ambulatory palliative care. - Also discussed Trental/Vitamin E trial

## 2017-06-23 DIAGNOSIS — R52 Pain, unspecified: Secondary | ICD-10-CM | POA: Diagnosis not present

## 2017-06-23 DIAGNOSIS — Y842 Radiological procedure and radiotherapy as the cause of abnormal reaction of the patient, or of later complication, without mention of misadventure at the time of the procedure: Secondary | ICD-10-CM | POA: Diagnosis not present

## 2017-06-23 DIAGNOSIS — I10 Essential (primary) hypertension: Secondary | ICD-10-CM | POA: Diagnosis not present

## 2017-06-24 ENCOUNTER — Telehealth (HOSPITAL_COMMUNITY): Payer: Self-pay | Admitting: Adult Health

## 2017-06-24 NOTE — Telephone Encounter (Signed)
Attempted to reach pt to discuss recommendations from palliative care and share with him who will be following his care at [facility] in [place].    I have asked that he return my call at [phone] and ask to speak with the nurses and I will be happy to speak with them as well.    Awaiting return call.     [name], NP [place] [phone]

## 2017-06-25 ENCOUNTER — Encounter: Payer: Self-pay | Admitting: Radiation Oncology

## 2017-06-28 ENCOUNTER — Encounter: Payer: Self-pay | Admitting: Radiation Oncology

## 2017-06-29 ENCOUNTER — Telehealth (HOSPITAL_COMMUNITY): Payer: Self-pay | Admitting: Adult Health

## 2017-06-29 MED FILL — oxyCODONE HCL 5 MG/5ML SOLN: 5 | 30 days supply | Qty: 1800 | Fill #0

## 2017-06-29 NOTE — Telephone Encounter (Signed)
Received return call from patient's wife and from Kevin Mcclain on speaker phone.  They are confused with the follow-up plan going forward, as they were initially scheduled to see Dr. Tammi Klippel for follow-up and have now been rescheduled to see Dr. Isidore Moos for follow-up.    I explained the events that have occurred over the past week or so, including my presenting the patient's case at the Palliative Care Conference to discuss plan of care going forward.   Initial plan was to have patient follow-up with his PCP for pain management, with the assistance of outpatient palliative care support/recommendations.  However, Dr. Yong Channel (pt's PCP) does not feel comfortable managing Kevin Mcclain pain, as the dosages of his opiates are higher than he is accustomed to prescribing.    Discussed with Dr. Tammi Klippel with radiation oncology.  Given that the patient has long-term sequale of radiation therapy, including pain, he recommends having radiation oncologist follow him for additional pain management/oncologic follow-up.    He will see Dr. Isidore Moos at [facility] for follow-up, since she previously treated Kevin Mcclain for his tonsil cancer.  Shared with patient that Dr. Tammi Klippel had recommended the option of trying Trental + Vitamin E BID.  I will defer to rad onc to order these medications, so that subsequent refills can come from their department since I am leaving [place].    Patient with verbal understanding and agreement to this follow-up plan.  Per patient, Dr. Maryjean Ka with [facility] (who has been involved in his case and pain management) may be willing to contribute to his long term pain management as well.  Will defer to Dr. Isidore Moos and her team on how best to proceed going forward.   It has been an honor to participate in Kevin Mcclain care.     [name], NP [place] [phone]

## 2017-06-30 NOTE — Progress Notes (Signed)
Kevin Mcclain presents for follow up to help with pain management.   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 His pain is in the back of his throat. He also has left arm pain from his shoulder to his thumb, this pain has been there since having shingles during radiation in 2016 Using a feeding tube?: removed.  Weight changes, if any:  Wt Readings from Last 3 Encounters:  07/02/17 130 lb (59 kg)  06/16/17 129 lb 3.2 oz (58.6 kg)  04/06/17 127 lb (57.6 kg)   Swallowing issues, if any: He has pain when swallowing problems, he feels like at times food has difficulty going down past a certain point. He is eating softer foods only. He reports that food does well for the first bites, but then will get stuck.  Smoking or chewing tobacco? No Using fluoride trays daily? He is using prevident toothpaste.  Last ENT visit was on: Dr. Erik Obey 04/06/07 Other notable issues, if any:  Kevin Craze NP at [facility] has been managing his pain medicine. Per her telephone note 06/29/17 she is leaving [facility]. It has been recommended for Dr. Isidore Moos to manage his pain, as she treated him with radiation for his Right Tonsil cancer October- November 2016.   Kevin Craze NP 06/16/17 documents: -Kevin Mcclain Controlled Substance Reporting System reviewed and refills will be appropriate beginning in 06/2017.  62-month's supply for Fentanyl patch and Oxycodone solution e-scribed to [facility] per patient request.                *Fentanyl patch 50 mcg transdermal Q72H, #10; may fill on or after 07/10/17             *Fentanyl patch  50 mcg transdermal Q72H, #10; may fill on or after 08/09/17             *Fentanyl patch 50 mcg transdermal Q72H, #10; may fill on or after 09/08/17             *Oxycodone soln 5mg /18mL, take 10-15mg   po Q4Hprn, #2051mL; may fill on or after 06/30/17             *Oxycodone soln 5mg /76mL, take 10-15mg   po Q4Hprn, #2084mL; may fill on or after 07/30/17             *Oxycodone soln 5mg /86mL, take 10-15mg   po Q4Hprn, #2037mL; may fill on or after 08/29/17  BP (!) 134/53   Pulse (!) 58   Temp 98.1 F (36.7 C)   Resp 16   Ht 5\' 2"  (1.575 m)   Wt 130 lb (59 kg)   SpO2 100% Comment: room air  BMI 23.78 kg/m

## 2017-07-02 ENCOUNTER — Ambulatory Visit
Admission: RE | Admit: 2017-07-02 | Discharge: 2017-07-02 | Disposition: A | Discharge status: Home / Self Care | Payer: Medicare HMO | Source: Ambulatory Visit | Attending: Radiation Oncology | Admitting: Radiation Oncology

## 2017-07-02 ENCOUNTER — Encounter: Payer: Self-pay | Admitting: Radiation Oncology

## 2017-07-02 ENCOUNTER — Other Ambulatory Visit: Payer: Self-pay

## 2017-07-02 VITALS — BP 134/53 | HR 58 | Temp 98.1°F | Resp 16 | Ht 62.0 in | Wt 130.0 lb

## 2017-07-02 DIAGNOSIS — Z923 Personal history of irradiation: Secondary | ICD-10-CM | POA: Diagnosis not present

## 2017-07-02 DIAGNOSIS — Z7982 Long term (current) use of aspirin: Secondary | ICD-10-CM | POA: Diagnosis not present

## 2017-07-02 DIAGNOSIS — C090 Malignant neoplasm of tonsillar fossa: Secondary | ICD-10-CM

## 2017-07-02 DIAGNOSIS — Z79899 Other long term (current) drug therapy: Secondary | ICD-10-CM | POA: Diagnosis not present

## 2017-07-02 DIAGNOSIS — R1310 Dysphagia, unspecified: Secondary | ICD-10-CM | POA: Insufficient documentation

## 2017-07-02 DIAGNOSIS — Z8589 Personal history of malignant neoplasm of other organs and systems: Secondary | ICD-10-CM | POA: Insufficient documentation

## 2017-07-02 DIAGNOSIS — M79602 Pain in left arm: Secondary | ICD-10-CM | POA: Diagnosis not present

## 2017-07-02 DIAGNOSIS — Z85818 Personal history of malignant neoplasm of other sites of lip, oral cavity, and pharynx: Secondary | ICD-10-CM | POA: Diagnosis not present

## 2017-07-02 DIAGNOSIS — Z7984 Long term (current) use of oral hypoglycemic drugs: Secondary | ICD-10-CM | POA: Diagnosis not present

## 2017-07-02 DIAGNOSIS — R1312 Dysphagia, oropharyngeal phase: Secondary | ICD-10-CM | POA: Diagnosis not present

## 2017-07-02 DIAGNOSIS — Z08 Encounter for follow-up examination after completed treatment for malignant neoplasm: Secondary | ICD-10-CM | POA: Diagnosis not present

## 2017-07-02 DIAGNOSIS — E0789 Other specified disorders of thyroid: Secondary | ICD-10-CM | POA: Diagnosis not present

## 2017-07-02 MED ORDER — LARYNGOSCOPY SOLUTION RAD-ONC
15.0000 mL | Freq: Once | TOPICAL | Status: AC
  Administered 2017-07-02: 15 mL via TOPICAL
  Filled 2017-07-02: qty 15

## 2017-07-02 NOTE — Progress Notes (Signed)
Radiation Oncology         [phone] ________________________________  Name: Kevin Mcclain MRN: [id number]  Date: 07/02/2017  DOB: 09-Sep-1945  Re-Consultation Visit Note  CC: Marin Olp, MD  Holley Bouche, NP  Diagnosis and Prior Radiotherapy:       ICD-10-CM   1. 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  CT of the neck, chest, and abdomen in January 2018 showed no evidence of cancer. At the time of neck imaging, he had right submandibular sialoadenitis, right neck inflammation, and pharyngitis. The patient still follows with Dr. Erik Obey and last saw him in November. No evidence of disease at that time. His last TSH was 4.88, and I don't see a free T4 from that time. He is taking levothyroxine 75 mcg under the care of Dr. Yong Channel.    Of note, the patient had been followed by [name], NP, at [facility]. However, I understand that she will be leaving the [facility], and she was looking to transition his care to another provider. I understand that other providers in the [facility], as well as the patient's PCP, were not comfortable in managing the patient's pain and opiate prescriptions. He is currently taking fentanyl 50 mcg patches and 10-15 mg of Roxicodone every 4 hours prn. Patient reports he is seeing Dr. Maryjean Ka for pain management.    The patient has been having worsening difficulty with swallowing and feels like food gets stuck in  his upper esophagus. Esophagram from 03/25/2017 showed no definite findings to explain the patient's dysphasia. No gross stricture of mass lesion. Dr. Hilarie Fredrickson stretched his esophagus, but he reports it did not help his symptoms.   On review of systems, the patient reports bilateral hearing impairment. He wears hearing aids. He reports dysphasia. He chokes on food after a few bites and subsequently vomits. He states, "it gets hung on the little thing in the back of my throat." He reports constant 6/10 pain in the back of his throat. He describes it as pulsating pain and is the worst with swallowing. He reports his pain medications help but do not completely relieve the pain. He denies any bowel issues or constipation. He also has left arm pain from his shoulder to his thumb. This pain has been there since having shingles during radiation in 2016. His wife reports he has had some delayed cognitive function and it is unclear if this is related to the opiates.   ALLERGIES:  is allergic to benzonatate; codeine; and sitagliptin phosphate.  Meds: Current Outpatient Medications  Medication Sig Dispense Refill  . aspirin 81 MG tablet Take 81 mg by mouth daily. Does not take everyday    . atorvastatin (LIPITOR) 10 MG tablet Place 1 tablet (10 mg total) into feeding tube daily. 90 tablet 3  . [START ON 09/08/2017] fentaNYL (DURAGESIC - DOSED MCG/HR) 50 MCG/HR Place 1 patch (50 mcg total) onto the skin every 3 (three) days. 10  patch 0  . levothyroxine (SYNTHROID, LEVOTHROID) 75 MCG tablet Take 1 tablet (75 mcg total) by mouth daily. 90 tablet 1  . metFORMIN (GLUCOPHAGE) 500 MG tablet Take 1 tablet (500 mg total) by mouth daily with breakfast. 90 tablet 1  . metoprolol succinate (TOPROL-XL) 25 MG 24 hr tablet Take 1 tablet (25 mg total) daily by mouth. Take with or immediately following a meal. 90 tablet 3  . nitroGLYCERIN (NITROSTAT) 0.4 MG SL tablet Place 1 tablet (0.4 mg total) under the tongue every 5 (five)  minutes as needed. 50 tablet 3  . OVER THE COUNTER MEDICATION Place 1 drop into both eyes 3 (three) times daily as needed (dry eyes/ irritation). Clear Cooling Eye Drops    . [START ON 08/29/2017] oxyCODONE (ROXICODONE) 5 MG/5ML solution Take 10-15 mLs (10-15 mg total) by mouth every 4 (four) hours as needed for severe pain. 2000 mL 0  . prochlorperazine (COMPAZINE) 10 MG tablet Take 1 tablet (10 mg total) by mouth every 6 (six) hours as needed for nausea or vomiting. 90 tablet 4  . ramipril (ALTACE) 5 MG capsule Take 2 pills in the morning, Take 1 pill in the evening 135 capsule 3  . sertraline (ZOLOFT) 100 MG tablet Take 1 tablet (100 mg total) by mouth daily. 30 tablet 5  . sodium fluoride (PREVIDENT 5000 PLUS) 1.1 % CREA dental cream Apply to tooth brush. Brush teeth for 2 minutes. Spit out excess-DO NOT swallow. Repeat nightly. 1 Tube prn   Current Facility-Administered Medications  Medication Dose Route Frequency Provider Last Rate Last Dose  . 0.9 %  sodium chloride infusion  500 mL Intravenous Once Pyrtle, Lajuan Lines, MD        Physical Findings: Wt Readings from Last 3 Encounters:  07/02/17 130 lb (59 kg)  06/16/17 129 lb 3.2 oz (58.6 kg)  04/06/17 127 lb (57.6 kg)    height is 5\' 2"  (1.575 m) and weight is 130 lb (59 kg). His temperature is 98.1 F (36.7 C). His blood pressure is 134/53 (abnormal) and his pulse is 58 (abnormal). His respiration is 16 and oxygen saturation is 100%.  General: Alert and oriented, in no acute distress. HEENT: Head is normocephalic. Mucous membranes are somewhat dry. He has some telangiectasias in the oropharynx particularly on the left side. Oral cavity and oropharynx are otherwise clear.  Neck: No palpable masses in the cervical or supraclavicular regions. No significant lymphedema in his neck. He has mild fibrosis in the tissues of his neck. Heart: Regular in rate and rhythm with no murmurs, rubs, or gallops. Chest: Clear to auscultation bilaterally, with  no rhonchi, wheezes, or rales. Abdomen: Soft, nontender, nondistended, with no rigidity or guarding.  PROCEDURE NOTE: After obtaining consent and anesthetizing the nasal cavity with topical lidocaine and phenylephrine, the flexible endoscope was introduced and passed through the nasal cavity.  The patient's left base of tongue shows smooth mucosa with mild swelling, more so on the left compared to the right, but no sign of tumor. In the larynx there are no lesions apparent. The epiglottis and supraglottis appear healthy, and the true cords symmetrically mobile with good glottic closure.   Lab Findings: Lab Results  Component Value Date   WBC 5.5 03/16/2017   HGB 13.6 03/16/2017   HCT 40.9 03/16/2017   MCV 87.8 03/16/2017   PLT 172 03/16/2017    Lab Results  Component Value Date   TSH 4.88 (H) 03/17/2017    Radiographic Findings: No results found.  Impression/Plan:    1) Head and Neck Cancer Status: No evidence of disease on exam. He is however coping with late effects of treatment and pain levels that are unfortunately and unusually severe and chronic.  Luckily, he has a dedicated team taking care of him and I will be part of that team.  2) Nutritional Status: Weight stable.  3) Swallowing: Patient is having dysphagia and odynophagia. Esophagram did not show any findings to explain dysphasia and it was not helped by esophageal stretching.  We discussed nutritional modifications today to try.  5) Dental: Encouraged patient to see dentistry for regular followup with dentistry, and dental hygiene including fluoride rinses.   6) Thyroid function: He is taking levothyroxine 75 mcg under the care of Dr. Yong Channel.   Lab Results  Component Value Date   TSH 4.88 (H) 03/17/2017    7) Pain: Patient currently on fentanyl patch and Roxicodone. I will reach out to Dr. Maryjean Ka regarding the plan for his pain management as patient expressed that he is the point person for this.    8)  Follow-up with Dr. Erik Obey in 3 months and myself in 6 months. The patient was encouraged to call with any issues or questions before then.  I spent 40 minutes face to face with the patient and more than 50% of that time was spent in counseling and/or coordination of care. _____________________________________   Eppie Gibson, MD  This document serves as a record of services personally performed by Eppie Gibson, MD. It was created on her behalf by Rae Lips, a trained medical scribe. The creation of this record is based on the scribe's personal observations and the provider's statements to them. This document has been checked and approved by the attending provider.

## 2017-07-04 ENCOUNTER — Encounter: Payer: Self-pay | Admitting: Radiation Oncology

## 2017-07-05 ENCOUNTER — Other Ambulatory Visit: Payer: Self-pay | Admitting: Radiation Oncology

## 2017-07-05 DIAGNOSIS — C090 Malignant neoplasm of tonsillar fossa: Secondary | ICD-10-CM

## 2017-07-05 MED ORDER — PENTOXIFYLLINE ER 400 MG PO TBCR: EXTENDED_RELEASE_TABLET | ORAL | 5 refills | Status: DC

## 2017-07-05 MED ORDER — VITAMIN E 180 MG (400 UNIT) PO CAPS: ORAL_CAPSULE | ORAL | 5 refills | Status: DC

## 2017-07-05 NOTE — Progress Notes (Signed)
ICD-10-CM   1. Cancer of tonsillar fossa (HCC) C09.0 pentoxifylline (TRENTAL) 400 MG CR tablet    vitamin E 400 UNIT capsule   I spoke with Dr. Maryjean Ka today about [name].  Dr. Maryjean Ka is happy to be in charge of the patient's pain management.  I also let Dr. Maryjean Ka know that the patient and I had talked about trying [place] and vitamin E which can sometimes be helpful to address fibrosis, scar tissue, inflammation or other types of tissue damage related to radiotherapy.  Dr. Maryjean Ka is also aware that subjectively the patient's wife has noticed some cognitive decline and they are interested in seeing if other medications can be used to control his pain and not contribute to cognitive slowing.  I then spoke with Mrs. Kevin Mcclain to let her know that the prescriptions above are [facility] [place] and let her know about the phone call with Dr. Maryjean Ka.  She said that she would let [name] know.  She thanked me for the call.  -----------------------------------  Eppie Gibson, MD

## 2017-07-07 ENCOUNTER — Institutional Professional Consult (permissible substitution): Payer: Self-pay | Admitting: Radiation Oncology

## 2017-07-08 MED FILL — fentaNYL 50 MCG/HR PT72: 50 | 30 days supply | Qty: 10 | Fill #0

## 2017-07-29 DIAGNOSIS — R52 Pain, unspecified: Secondary | ICD-10-CM | POA: Diagnosis not present

## 2017-07-29 DIAGNOSIS — Z79891 Long term (current) use of opiate analgesic: Secondary | ICD-10-CM | POA: Diagnosis not present

## 2017-07-29 MED FILL — oxyCODONE HCL 5 MG/5ML SOLN: 5 | 30 days supply | Qty: 2000 | Fill #0

## 2017-08-10 MED FILL — fentaNYL 50 MCG/HR PT72: 50 | 30 days supply | Qty: 10 | Fill #0

## 2017-08-16 ENCOUNTER — Ambulatory Visit: Payer: Medicare HMO

## 2017-08-16 ENCOUNTER — Ambulatory Visit: Payer: Medicare HMO | Admitting: Radiation Oncology

## 2017-08-27 MED FILL — oxyCODONE HCL 5 MG/5ML SOLN: 5 | 30 days supply | Qty: 2000 | Fill #0

## 2017-09-05 ENCOUNTER — Other Ambulatory Visit: Payer: Self-pay | Admitting: Family Medicine

## 2017-09-07 ENCOUNTER — Ambulatory Visit: Payer: Self-pay | Admitting: Family Medicine

## 2017-09-08 MED FILL — fentaNYL 50 MCG/HR PT72: 50 | 30 days supply | Qty: 10 | Fill #0

## 2017-09-11 ENCOUNTER — Other Ambulatory Visit: Payer: Self-pay | Admitting: Family Medicine

## 2017-09-21 ENCOUNTER — Ambulatory Visit: Payer: Medicare HMO | Admitting: Family Medicine

## 2017-09-24 MED FILL — oxyCODONE HCL 5 MG/5ML SOLN: 5 | 30 days supply | Qty: 2000 | Fill #0

## 2017-09-29 DIAGNOSIS — Y842 Radiological procedure and radiotherapy as the cause of abnormal reaction of the patient, or of later complication, without mention of misadventure at the time of the procedure: Secondary | ICD-10-CM | POA: Diagnosis not present

## 2017-09-29 DIAGNOSIS — R52 Pain, unspecified: Secondary | ICD-10-CM | POA: Diagnosis not present

## 2017-10-05 ENCOUNTER — Other Ambulatory Visit: Payer: Self-pay | Admitting: Family Medicine

## 2017-10-29 ENCOUNTER — Ambulatory Visit: Payer: Medicare HMO | Admitting: Family Medicine

## 2017-11-09 ENCOUNTER — Encounter: Payer: Self-pay | Admitting: Family Medicine

## 2017-11-09 ENCOUNTER — Ambulatory Visit (INDEPENDENT_AMBULATORY_CARE_PROVIDER_SITE_OTHER): Payer: Medicare HMO | Admitting: Family Medicine

## 2017-11-09 VITALS — BP 130/60 | HR 54 | Temp 98.2°F | Ht 62.0 in | Wt 129.4 lb

## 2017-11-09 DIAGNOSIS — L989 Disorder of the skin and subcutaneous tissue, unspecified: Secondary | ICD-10-CM | POA: Diagnosis not present

## 2017-11-09 DIAGNOSIS — E782 Mixed hyperlipidemia: Secondary | ICD-10-CM | POA: Diagnosis not present

## 2017-11-09 DIAGNOSIS — E1142 Type 2 diabetes mellitus with diabetic polyneuropathy: Secondary | ICD-10-CM

## 2017-11-09 DIAGNOSIS — E039 Hypothyroidism, unspecified: Secondary | ICD-10-CM | POA: Diagnosis not present

## 2017-11-09 DIAGNOSIS — I10 Essential (primary) hypertension: Secondary | ICD-10-CM

## 2017-11-09 LAB — LIPID PANEL
CHOLESTEROL: 182 mg/dL (ref 0–200)
HDL: 42.9 mg/dL (ref >39.00)
LDL CALC: 106 mg/dL — AB (ref 0–99)
NonHDL: 138.92
Total CHOL/HDL Ratio: 4
Triglycerides: 166 mg/dL — ABNORMAL HIGH (ref 0.0–149.0)
VLDL: 33.2 mg/dL (ref 0.0–40.0)

## 2017-11-09 LAB — COMPREHENSIVE METABOLIC PANEL
ALT: 19 U/L (ref 0–53)
AST: 17 U/L (ref 0–37)
Albumin: 4 g/dL (ref 3.5–5.2)
Alkaline Phosphatase: 61 U/L (ref 39–117)
BUN: 14 mg/dL (ref 6–23)
CHLORIDE: 100 meq/L (ref 96–112)
CO2: 27 mEq/L (ref 19–32)
CREATININE: 0.92 mg/dL (ref 0.40–1.50)
Calcium: 9.1 mg/dL (ref 8.4–10.5)
GFR: 85.42 mL/min (ref >60.00)
Glucose, Bld: 92 mg/dL (ref 70–99)
Potassium: 3.6 mEq/L (ref 3.5–5.1)
Sodium: 135 mEq/L (ref 135–145)
Total Bilirubin: 0.5 mg/dL (ref 0.2–1.2)
Total Protein: 6.7 g/dL (ref 6.0–8.3)

## 2017-11-09 LAB — CBC
HCT: 38 % — ABNORMAL LOW (ref 39.0–52.0)
HEMOGLOBIN: 13.1 g/dL (ref 13.0–17.0)
MCHC: 34.6 g/dL (ref 30.0–36.0)
MCV: 86.4 fl (ref 78.0–100.0)
PLATELETS: 188 10*3/uL (ref 150.0–400.0)
RBC: 4.4 Mil/uL (ref 4.22–5.81)
RDW: 15 % (ref 11.5–15.5)
WBC: 5.4 10*3/uL (ref 4.0–10.5)

## 2017-11-09 LAB — HEMOGLOBIN A1C: Hgb A1c MFr Bld: 6.1 % (ref 4.6–6.5)

## 2017-11-09 LAB — TSH: TSH: 3.19 u[IU]/mL (ref 0.35–4.50)

## 2017-11-09 MED ORDER — ATORVASTATIN CALCIUM 10 MG PO TABS: 10.0000 mg | ORAL_TABLET | Freq: Every day | ORAL | 3 refills | Status: DC

## 2017-11-09 NOTE — Assessment & Plan Note (Signed)
S: controlled on Ramipril 10mg  in AM, metoprolol in evening BP Readings from Last 3 Encounters:  11/09/17 130/60  07/02/17 (!) 134/53  06/16/17 (!) 133/50  A/P: We discussed blood pressure goal of <140/90. Continue current meds

## 2017-11-09 NOTE — Assessment & Plan Note (Signed)
S: well controlled on atorvastatin 10mg  A/P: update lipid panel today (nonfasting)

## 2017-11-09 NOTE — Progress Notes (Signed)
Subjective:  Ulices Maack is a 72 y.o. year old very pleasant male patient who presents for/with See problem oriented charting ROS- some fatigue after an hour of yardwork   Past Medical History-  Patient Active Problem List   Diagnosis Date Noted  . Hypothyroidism 09/11/2016    Priority: High  . Ascending aortic aneurysm ([place]) 03/25/2016    Priority: High  . Cancer of tonsillar fossa ([place]) 10/31/2014    Priority: High  . Osteoarthritis 11/18/2010    Priority: High  . CAD (coronary artery disease) 10/11/2008    Priority: High  . Type 2 diabetes mellitus with peripheral neuropathy ([place]) 01/24/2007    Priority: High  . Depression, major, single episode, moderate ([place]) 04/02/2016    Priority: Medium  . Diabetic neuropathy ([place]) 12/26/2015    Priority: Medium  . Gout 03/28/2007    Priority: Medium  . HYPERLIPIDEMIA 01/24/2007    Priority: Medium  . Essential hypertension 01/24/2007    Priority: Medium  . B12 deficiency 01/15/2017    Priority: Low  . GERD (gastroesophageal reflux disease) 06/10/2015    Priority: Low  . Arthropathy 03/10/2013    Priority: Low  . Low back pain 11/26/2011    Priority: Low  . Kidney stone 11/04/2011    Priority: Low  . Senile purpura ([place]) 03/17/2017  . BPH associated with nocturia 10/30/2016  . Herpes zoster 07/15/2015    Medications- reviewed and updated Current Outpatient Medications  Medication Sig Dispense Refill  . aspirin 81 MG tablet Take 81 mg by mouth daily. Does not take everyday    . atorvastatin (LIPITOR) 10 MG tablet Take 1 tablet (10 mg total) by mouth daily. 90 tablet 3  . fentaNYL (DURAGESIC - DOSED MCG/HR) 50 MCG/HR Place 1 patch (50 mcg total) onto the skin every 3 (three) days. 10 patch 0  . levothyroxine (SYNTHROID, LEVOTHROID) 75 MCG tablet TAKE 1 TABLET BY MOUTH EVERY DAY 90 tablet 1  . metFORMIN (GLUCOPHAGE) 500 MG tablet TAKE 1 TABLET BY MOUTH EVERY DAY WITH BREAKFAST 90 tablet 1  . metoprolol succinate  (TOPROL-XL) 25 MG 24 hr tablet Take 1 tablet (25 mg total) daily by mouth. Take with or immediately following a meal. 90 tablet 3  . nitroGLYCERIN (NITROSTAT) 0.4 MG SL tablet Place 1 tablet (0.4 mg total) under the tongue every 5 (five) minutes as needed. 50 tablet 3  . OVER THE COUNTER MEDICATION Place 1 drop into both eyes 3 (three) times daily as needed (dry eyes/ irritation). Clear Cooling Eye Drops    . oxyCODONE (ROXICODONE) 5 MG/5ML solution Take 10-15 mLs (10-15 mg total) by mouth every 4 (four) hours as needed for severe pain. 2000 mL 0  . pentoxifylline (TRENTAL) 400 MG CR tablet Take 400mg  tab daily x 1 week, then 400mg  BID; take with food 60 tablet 5  . prochlorperazine (COMPAZINE) 10 MG tablet Take 1 tablet (10 mg total) by mouth every 6 (six) hours as needed for nausea or vomiting. 90 tablet 4  . ramipril (ALTACE) 5 MG capsule Take 2 pills in the morning, Take 1 pill in the evening 135 capsule 3  . sertraline (ZOLOFT) 100 MG tablet TAKE 1 TABLET BY MOUTH EVERY DAY 30 tablet 5  . sodium fluoride (PREVIDENT 5000 PLUS) 1.1 % CREA dental cream Apply to tooth brush. Brush teeth for 2 minutes. Spit out excess-DO NOT swallow. Repeat nightly. 1 Tube prn  . vitamin E 400 UNIT capsule Take 400 IU daily x 1 week, then 400  IU BID 60 capsule 5   No current facility-administered medications for this visit.     Objective: BP 130/60 (BP Location: Left Arm, Patient Position: Sitting, Cuff Size: Normal)   Pulse (!) 54   Temp 98.2 F (36.8 C) (Oral)   Ht 5\' 2"  (1.575 m)   Wt 129 lb 6.4 oz (58.7 kg)   SpO2 97%   BMI 23.67 kg/m  Gen: NAD, resting comfortably Dark brown/black lesion on left side of nose (wife reports has grown some recently but present for years- saw Dr. Wilhemina Bonito years ago- wanting new dermatologist) CV: RRR no murmurs rubs or gallops Lungs: CTAB no crackles, wheeze, rhonchi Abdomen: soft/nontender/nondistended/normal bowel sounds.  Ext: no edema Skin: warm, dry Diabetic  Foot Exam - Simple   Simple Foot Form Diabetic Foot exam was performed with the following findings:  Yes 11/09/2017  2:06 PM  Visual Inspection No deformities, no ulcerations, no other skin breakdown bilaterally:  Yes Sensation Testing Intact to touch and monofilament testing bilaterally:  Yes Pulse Check Posterior Tibialis and Dorsalis pulse intact bilaterally:  Yes Comments    Assessment/Plan:  Other notes: 1.  Trouble with activity- can do yardwork for about an hour but wears out after that- hotter weather is harder. No chest pain or shortness of breath. We discussed could be low CBGs, thyroid related, anemia related possibly- will update labs  2. Refer to derm for lesion on left side of nose- somewhat jagged edges - want to get their opinion on melanoma but dont strongly suspect given prior derm evaluation. He wants new dermatologist and referred to [facility]   Type 2 diabetes mellitus with peripheral neuropathy ([place]) S:  controlled on metformin 500mg  daily- no lows. Also has some loss of sensation/neuropathy in hands Lab Results  Component Value Date   HGBA1C 5.3 03/17/2017   HGBA1C 6.1 10/30/2016   HGBA1C 5.2 06/29/2016  A/P: update a1c today, with fatigue after working for an hour, we talked about cutting metformin in half or taking off if a1c still under 5.5- just in case sugars are running low. Foot exam stable- neuropathy in hands.    HYPERLIPIDEMIA S: well controlled on atorvastatin 10mg  A/P: update lipid panel today (nonfasting)  Hypothyroidism S: On thyroid medication- levothyroxine 75 mcg Lab Results  Component Value Date   TSH 4.88 (H) 03/17/2017  A/P: update tsh today  Essential hypertension S: controlled on Ramipril 10mg  in AM, metoprolol in evening BP Readings from Last 3 Encounters:  11/09/17 130/60  07/02/17 (!) 134/53  06/16/17 (!) 133/50  A/P: We discussed blood pressure goal of <140/90. Continue current meds   Future  Appointments  Date Time Provider [place]  01/07/2018  2:00 PM Eppie Gibson, MD [facility] None   Advised 6 month CPE   Lab/Order associations: Skin lesion of face - Plan: Ambulatory referral to Dermatology  Type 2 diabetes mellitus with peripheral neuropathy ([place]) - Plan: Hemoglobin A1c  HYPERLIPIDEMIA - Plan: CBC, Comprehensive metabolic panel, Lipid panel  Hypothyroidism, unspecified type - Plan: TSH  Essential hypertension  Meds ordered this encounter  Medications  . atorvastatin (LIPITOR) 10 MG tablet    Sig: Take 1 tablet (10 mg total) by mouth daily.    Dispense:  90 tablet    Refill:  3    Return precautions advised.  Garret Reddish, MD

## 2017-11-09 NOTE — Assessment & Plan Note (Signed)
S: On thyroid medication- levothyroxine 75 mcg Lab Results  Component Value Date   TSH 4.88 (H) 03/17/2017  A/P: update tsh today

## 2017-11-09 NOTE — Patient Instructions (Addendum)
Health Maintenance Due  Topic Date Due  . OPHTHALMOLOGY EXAM -pt will call to schedule appt 08/07/1955  . FOOT EXAM -done in office today 06/09/2016  . TETANUS/TDAP -pt will get at his pharmacy 01/23/2017  . HEMOGLOBIN A1C -done in office today 09/14/2017  . INFLUENZA VACCINE -please call our office to schedule this in October/November 09/30/2017   with fatigue after working for an hour, we talked about cutting metformin in half or taking off if a1c still under 5.5- just in case sugars are running low  We will call you within two weeks about your referral to dermatology. If you do not hear within 3 weeks, give [place] a call.   Please stop by lab before you go

## 2017-11-09 NOTE — Assessment & Plan Note (Addendum)
S:  controlled on metformin 500mg  daily- no lows. Also has some loss of sensation/neuropathy in hands Lab Results  Component Value Date   HGBA1C 5.3 03/17/2017   HGBA1C 6.1 10/30/2016   HGBA1C 5.2 06/29/2016  A/P: update a1c today, with fatigue after working for an hour, we talked about cutting metformin in half or taking off if a1c still under 5.5- just in case sugars are running low. Foot exam stable- neuropathy in hands.

## 2017-11-19 ENCOUNTER — Telehealth: Payer: Self-pay | Admitting: Family Medicine

## 2017-11-19 NOTE — Telephone Encounter (Signed)
See note  Copied from [place] [phone]. Topic: Inquiry >> Nov 19, 2017  4:57 PM Berneta Levins wrote: Jeneen Rinks with [employer] calling to do a medication reconciliation review.  States he can be reached at [phone]

## 2017-11-25 ENCOUNTER — Telehealth: Payer: Self-pay | Admitting: Family Medicine

## 2017-11-25 NOTE — Telephone Encounter (Signed)
Copied from [place] [id number]. Topic: Quick Communication - See Telephone Encounter >> Nov 25, 2017  1:48 PM Margot Ables wrote: CRM for notification. See Telephone encounter for: 11/25/17. Kelli Hope is contracted with the pts insurance and requesting call back for medication review.

## 2017-11-25 NOTE — Telephone Encounter (Signed)
See note

## 2017-12-09 DIAGNOSIS — H9113 Presbycusis, bilateral: Secondary | ICD-10-CM | POA: Diagnosis not present

## 2017-12-09 DIAGNOSIS — Z85818 Personal history of malignant neoplasm of other sites of lip, oral cavity, and pharynx: Secondary | ICD-10-CM | POA: Diagnosis not present

## 2017-12-11 ENCOUNTER — Other Ambulatory Visit: Payer: Self-pay | Admitting: Family Medicine

## 2017-12-14 ENCOUNTER — Other Ambulatory Visit: Payer: Self-pay | Admitting: Family Medicine

## 2017-12-14 DIAGNOSIS — H903 Sensorineural hearing loss, bilateral: Secondary | ICD-10-CM | POA: Diagnosis not present

## 2017-12-14 MED ORDER — RAMIPRIL 10 MG PO CAPS: 10.0000 mg | ORAL_CAPSULE | Freq: Every day | ORAL | 1 refills | Status: DC

## 2017-12-14 NOTE — Telephone Encounter (Signed)
Requested medication (s) are due for refill today:  yes  Requested medication (s) are on the active medication list:  yes  Future visit scheduled:  No   Last Refill: 01/15/17   Please review dose, as medication record is different than dose pt. Is currently taking; wife stated the pt. Is taking Ramipril 10 mg daily in AM.   Requested Prescriptions  Pending Prescriptions Disp Refills   ramipril (ALTACE) 5 MG capsule 135 capsule 3    Sig: Take 2 pills in the morning, Take 1 pill in the evening     Cardiovascular:  ACE Inhibitors Passed - 12/14/2017  4:27 PM      Passed - Cr in normal range and within 180 days    Creatinine  Date Value Ref Range Status  05/16/2015 0.8 0.7 - 1.3 mg/dL Final   Creatinine, Ser  Date Value Ref Range Status  11/09/2017 0.92 0.40 - 1.50 mg/dL Final         Passed - K in normal range and within 180 days    Potassium  Date Value Ref Range Status  11/09/2017 3.6 3.5 - 5.1 mEq/L Final  05/16/2015 4.5 3.5 - 5.1 mEq/L Final         Passed - Patient is not pregnant      Passed - Last BP in normal range    BP Readings from Last 1 Encounters:  11/09/17 130/60         Passed - Valid encounter within last 6 months    Recent Outpatient Visits          1 month ago Type 2 diabetes mellitus with peripheral neuropathy ([place])   [facility] [place], Brayton Mars, MD   9 months ago Type 2 diabetes mellitus with peripheral neuropathy [facility])   [place], Brayton Mars, MD   11 months ago Essential hypertension   [place], Brayton Mars, MD   1 year ago Depression [facility])   [place] [facility] [place], Brayton Mars, MD   1 year ago BPH associated with nocturia   [place] [facility], Brayton Mars, MD      Future Appointments            In 4 months Yong Channel, Brayton Mars, MD [place], [facility]

## 2017-12-14 NOTE — Telephone Encounter (Signed)
See note

## 2017-12-14 NOTE — Telephone Encounter (Signed)
Per OV note 11/09/17:  Essential hypertension S: controlled on Ramipril 10mg  in AM, metoprolol in evening    BP Readings from Last 3 Encounters:  11/09/17 130/60  07/02/17 (!) 134/53  06/16/17 (!) 133/50  A/P: We discussed blood pressure goal of <140/90. Continue current meds

## 2017-12-14 NOTE — Telephone Encounter (Signed)
Phone call to pt's. Wife to clarify if pt. Is taking Ramipril 5 mg. Capsule, 2 q AM, and 1 q PM.  Pt's wife stated that in December of 2018, his BP was low and he was advised to take Ramipril 10 mg q AM.  Stated the pt. is out of the Ramipril at this time.  Will send to office for review / recommendation of dose.  Wife verb. Understanding.

## 2017-12-14 NOTE — Telephone Encounter (Signed)
10mg  capsule sent to pharmacy

## 2017-12-14 NOTE — Telephone Encounter (Signed)
Copied from [place] [id number]. Topic: Quick Communication - Rx Refill/Question >> Dec 14, 2017  4:03 PM Mcneil, Ja-Kwan wrote: Medication: ramipril (ALTACE) 10 MG capsule  Has the patient contacted their pharmacy? yes   Preferred Pharmacy (with phone number or street name): [id number] - [place], [place] - [address] [phone] (Phone) [phone] (Fax)  Agent: Please be advised that RX refills may take up to 3 business days. We ask that you follow-up with your pharmacy.

## 2017-12-29 DIAGNOSIS — R52 Pain, unspecified: Secondary | ICD-10-CM | POA: Diagnosis not present

## 2018-01-03 ENCOUNTER — Telehealth: Payer: Self-pay | Admitting: *Deleted

## 2018-01-03 NOTE — Telephone Encounter (Signed)
CALLED PATIENT TO ASK ABOUT CHANGING FU FROM 2 PM ON 01-07-18 TO 10:40 AM ON 01-07-18, PATIENT AGREED TO CHANGE

## 2018-01-04 NOTE — Progress Notes (Signed)
Kevin Mcclain presents for follow up of radiation completed 01/18/15 to his right tonsil and bilateral neck.   Pain issues, if any: He reports chronic throat pain. He reports it as a 5/10 today. He is using a fentanyl patch and oxycodone. He tells me that the pain never really goes away. Using a feeding tube?: No Weight changes, if any:  Wt Readings from Last 3 Encounters:  01/07/18 132 lb 6 oz (60 kg)  11/09/17 129 lb 6.4 oz (58.7 kg)  07/02/17 130 lb (59 kg)   Swallowing issues, if any: He tells me that food gets stuck in his throat, it will cause him to cough (choke) at times.  Smoking or chewing tobacco? No Using fluoride trays daily? He is not using the trays. He brushes his teeth multiple times daily with a fluoride toothpaste.  Last ENT visit was on: Dr. Erik Obey 38/93/73. Hearing loss addressed and also recommended follow up with Dr. Enrique Sack due to ? 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 Other notable issues, if any:   BP (!) 139/48 (BP Location: Right Arm, Patient Position: Sitting)   Pulse (!) 52   Temp 97.7 F (36.5 C) (Oral)   Resp 18   Ht 5\' 4"  (1.626 m)   Wt 132 lb 6 oz (60 kg)   SpO2 100%   BMI 22.72 kg/m

## 2018-01-07 ENCOUNTER — Ambulatory Visit
Admission: RE | Admit: 2018-01-07 | Discharge: 2018-01-07 | Disposition: A | Discharge status: Home / Self Care | Payer: Medicare HMO | Source: Ambulatory Visit | Attending: Radiation Oncology | Admitting: Radiation Oncology

## 2018-01-07 ENCOUNTER — Encounter: Payer: Self-pay | Admitting: Radiation Oncology

## 2018-01-07 ENCOUNTER — Telehealth: Payer: Self-pay | Admitting: *Deleted

## 2018-01-07 ENCOUNTER — Other Ambulatory Visit: Payer: Self-pay

## 2018-01-07 VITALS — BP 139/48 | HR 52 | Temp 97.7°F | Resp 18 | Ht 64.0 in | Wt 132.4 lb

## 2018-01-07 DIAGNOSIS — Z79899 Other long term (current) drug therapy: Secondary | ICD-10-CM | POA: Insufficient documentation

## 2018-01-07 DIAGNOSIS — Z08 Encounter for follow-up examination after completed treatment for malignant neoplasm: Secondary | ICD-10-CM | POA: Diagnosis not present

## 2018-01-07 DIAGNOSIS — C090 Malignant neoplasm of tonsillar fossa: Secondary | ICD-10-CM

## 2018-01-07 DIAGNOSIS — Z7984 Long term (current) use of oral hypoglycemic drugs: Secondary | ICD-10-CM | POA: Insufficient documentation

## 2018-01-07 DIAGNOSIS — Z923 Personal history of irradiation: Secondary | ICD-10-CM | POA: Insufficient documentation

## 2018-01-07 DIAGNOSIS — Z85818 Personal history of malignant neoplasm of other sites of lip, oral cavity, and pharynx: Secondary | ICD-10-CM | POA: Diagnosis not present

## 2018-01-07 DIAGNOSIS — Z885 Allergy status to narcotic agent status: Secondary | ICD-10-CM | POA: Diagnosis not present

## 2018-01-07 DIAGNOSIS — Z79890 Hormone replacement therapy (postmenopausal): Secondary | ICD-10-CM | POA: Insufficient documentation

## 2018-01-07 DIAGNOSIS — R1310 Dysphagia, unspecified: Secondary | ICD-10-CM | POA: Diagnosis not present

## 2018-01-07 DIAGNOSIS — Z7982 Long term (current) use of aspirin: Secondary | ICD-10-CM | POA: Insufficient documentation

## 2018-01-07 NOTE — Telephone Encounter (Signed)
Patient was referred by Dr. Isidore Moos. Left message on machine for patient to call back and schedule appointment with Dental Medicine.

## 2018-01-07 NOTE — Progress Notes (Signed)
Radiation Oncology         [phone] ________________________________  Name: Kevin Mcclain MRN: [id number]  Date: 01/07/2018  DOB: Nov 14, 1945  Re-Consultation Visit Note  CC: Marin Olp, MD  Holley Bouche, NP  Diagnosis and Prior Radiotherapy:       ICD-10-CM   1. 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 He is accompanied by his wife. Joesph now has hearing aids and reports he didn't realize how much he was unable to hear until getting them.  At his last visit with Dr. Erik Obey, an area of poor healing in his mouth was noticed (exposed bone).  He has not seen Dr. Enrique Sack as of yet to examine this.  He continues to see his pain doctor, Dr. Maryjean Ka. However, in the morning he continues to have a lot of pain and feels as if something is stuck in this throat. His brain reacts as if he is choking on something causing him to cough a lot. Eating softer foods are easier for him to swallow. He does not take pentoxifylline anymore because it did not help his symptoms and it upset his stomach. He continues to take vitamin E.  He is doing much better now that he no longer has false hope about the chances of him making a full recovery regarding his throat pain.   ALLERGIES:  is allergic to benzonatate; codeine; and sitagliptin phosphate.  Meds: Current Outpatient Medications  Medication Sig Dispense Refill  . aspirin 81 MG tablet Take 81 mg by mouth daily. Does not take everyday    .  atorvastatin (LIPITOR) 10 MG tablet Take 1 tablet (10 mg total) by mouth daily. 90 tablet 3  . fentaNYL (DURAGESIC - DOSED MCG/HR) 50 MCG/HR Place 1 patch (50 mcg total) onto the skin every 3 (three) days. 10 patch 0  . levothyroxine (SYNTHROID, LEVOTHROID) 75 MCG tablet TAKE 1 TABLET BY MOUTH EVERY DAY 90 tablet 1  . metFORMIN (GLUCOPHAGE) 500 MG tablet TAKE 1 TABLET BY MOUTH EVERY DAY WITH BREAKFAST 90 tablet 1  . metoprolol succinate (TOPROL-XL) 25 MG 24 hr tablet Take 1 tablet (25 mg total) daily by mouth. Take with or immediately following a meal. 90 tablet 3  . nitroGLYCERIN (NITROSTAT) 0.4 MG SL tablet Place 1 tablet (0.4 mg total) under the tongue every 5 (five) minutes as needed. 50 tablet 3  . OVER THE COUNTER MEDICATION Place 1 drop into both eyes 3 (three) times daily as needed (dry eyes/ irritation). Clear Cooling Eye Drops    . oxyCODONE (ROXICODONE) 5 MG/5ML solution Take 10-15 mLs (10-15 mg total) by mouth every 4 (four) hours as needed for severe pain. 2000 mL 0  . prochlorperazine (COMPAZINE) 10 MG tablet Take 1 tablet (10 mg total) by mouth every 6 (six) hours as needed for nausea or vomiting. 90 tablet 4  . ramipril (ALTACE) 10 MG capsule Take 1 capsule (10 mg total) by mouth daily. 90 capsule 1  . ramipril (ALTACE) 5 MG  capsule Take 2 pills in the morning, Take 1 pill in the evening 135 capsule 3  . sertraline (ZOLOFT) 100 MG tablet TAKE 1 TABLET BY MOUTH EVERY DAY 90 tablet 2  . sodium fluoride (PREVIDENT 5000 PLUS) 1.1 % CREA dental cream Apply to tooth brush. Brush teeth for 2 minutes. Spit out excess-DO NOT swallow. Repeat nightly. 1 Tube prn  . vitamin E 400 UNIT capsule Take 400 IU daily x 1 week, then 400 IU BID 60 capsule 5   No current facility-administered medications for this encounter.    REVIEW OF SYSTEMS: A 10+ POINT REVIEW OF SYSTEMS WAS OBTAINED including neurology, dermatology, psychiatry, cardiac, respiratory, lymph, extremities, GI, GU, musculoskeletal,  constitutional, reproductive, HEENT. All pertinent positives are noted in the HPI. All others are negative.   Physical Findings: Wt Readings from Last 3 Encounters:  01/07/18 132 lb 6 oz (60 kg)  11/09/17 129 lb 6.4 oz (58.7 kg)  07/02/17 130 lb (59 kg)    height is 5\' 4"  (1.626 m) and weight is 132 lb 6 oz (60 kg). His oral temperature is 97.7 F (36.5 C). His blood pressure is 139/48 (abnormal) and his pulse is 52 (abnormal). His respiration is 18 and oxygen saturation is 100%.  General: Alert and oriented, in no acute distress. HEENT: Head is normocephalic. Mucous membranes are somewhat dry. Oral cavity and oropharynx are clear. Right jaw posterior molar bed has a little area where the mucosal tissue has eroded.  Neck: No palpable masses in the cervical or supraclavicular regions. No significant lymphedema in his neck. He has moderate fibrosis in the tissues of his neck. Heart: Regular in rate and rhythm with no murmurs, rubs, or gallops. Chest: Clear to auscultation bilaterally, with no rhonchi, wheezes, or rales.  Lab Findings: Lab Results  Component Value Date   WBC 5.4 11/09/2017   HGB 13.1 11/09/2017   HCT 38.0 (L) 11/09/2017   MCV 86.4 11/09/2017   PLT 188.0 11/09/2017    Lab Results  Component Value Date   TSH 3.19 11/09/2017    Radiographic Findings: No results found.  Impression/Plan:    1) Head and Neck Cancer Status: No evidence of disease on exam.   2) Nutritional Status: Weight stable.  3) Swallowing: Patient continues to have stable dysphagia and odynophagia. We discussed nutritional modifications today to try and he continues to see Dr. Maryjean Ka for his pain management.   5) Dental: Encouraged patient to see dentistry for regular followup with dentistry, and dental hygiene including fluoride rinses. Referral to Dr. Enrique Sack for ?exposed bone per ENT (not obvious today, but there is mucosal erosion)  6) Thyroid function: He is taking levothyroxine 75 mcg  under the care of Dr. Yong Channel.   Lab Results  Component Value Date   TSH 3.19 11/09/2017    7) Pain: Continue being followed by Dr. Maryjean Ka.    8) Follow-up with Dr. Erik Obey in 6 months and myself in 1 year. The patient was encouraged to call with any issues or questions before then.  I spent 20 minutes face to face with the patient and more than 50% of that time was spent in counseling and/or coordination of care. _____________________________________   Eppie Gibson, MD  This document serves as a record of services personally performed by Eppie Gibson, MD. It was created on her behalf by Margit Banda, a trained medical scribe. The creation of this record is based on the scribe's personal observations and the provider's statements to them. This document  has been checked and approved by the attending provider.

## 2018-01-07 NOTE — Telephone Encounter (Signed)
CALLED DR. KULINSKI 'S OFFICE TO ARRANGE AN APPT. FOR THIS PT., SPOKE WITH KATIE AND SHE WILL GET THIS ARRANGED

## 2018-01-10 ENCOUNTER — Encounter: Payer: Self-pay | Admitting: Radiation Oncology

## 2018-01-10 ENCOUNTER — Telehealth: Payer: Self-pay | Admitting: *Deleted

## 2018-01-10 ENCOUNTER — Telehealth (HOSPITAL_COMMUNITY): Payer: Self-pay

## 2018-01-10 ENCOUNTER — Other Ambulatory Visit: Payer: Self-pay | Admitting: Radiation Oncology

## 2018-01-10 DIAGNOSIS — C090 Malignant neoplasm of tonsillar fossa: Secondary | ICD-10-CM

## 2018-01-10 NOTE — Telephone Encounter (Signed)
Called patient to schedule patients appt with Dental Medicine. LMOM for patient to return our call. Kevin Mcclain

## 2018-01-10 NOTE — Telephone Encounter (Signed)
CALLED DR. KULINSKI'S OFFICE TO ARRANGE THIS APPT. FOR THIS PATIENT, SPOKE WITH KATIE OF DR. KULINSKI'S OFFICE AND SHE SAID THAT SHE WOULD CALL HIM TODAY AND GET HIM SCHEDULED

## 2018-01-17 ENCOUNTER — Ambulatory Visit (INDEPENDENT_AMBULATORY_CARE_PROVIDER_SITE_OTHER): Payer: Medicare HMO | Admitting: Family Medicine

## 2018-01-17 ENCOUNTER — Encounter: Payer: Self-pay | Admitting: Family Medicine

## 2018-01-17 VITALS — BP 128/74 | HR 64 | Temp 98.6°F | Ht 62.0 in | Wt 133.2 lb

## 2018-01-17 DIAGNOSIS — J329 Chronic sinusitis, unspecified: Secondary | ICD-10-CM

## 2018-01-17 MED ORDER — AMOXICILLIN-POT CLAVULANATE 875-125 MG PO TABS: 1.0000 | ORAL_TABLET | Freq: Two times a day (BID) | ORAL | 0 refills | Status: DC

## 2018-01-17 NOTE — Progress Notes (Signed)
   Subjective:  Kevin Mcclain is a 72 y.o. male who presents today for same-day appointment with a chief complaint of sinus congestion.   HPI:  Sinus Congestion, Acute problem Started 1-2 weeks ago. Worsened over that past few days. Associated with facial pressure, sore throat, chills, and malaise. Drainage is yellowish-green in color. No fevers. No treatments tried.    ROS: Per HPI  PMH: He reports that he has never smoked. He has never used smokeless tobacco. He reports that he does not drink alcohol or use drugs.  Objective:  Physical Exam: BP 128/74 (BP Location: Left Arm, Patient Position: Sitting, Cuff Size: Normal)   Pulse 64   Temp 98.6 F (37 C) (Oral)   Ht 5\' 2"  (1.575 m)   Wt 133 lb 3.2 oz (60.4 kg)   SpO2 97%   BMI 24.36 kg/m   Gen: NAD, resting comfortably HEENT: TMs with clear effusion bilaterally.  Nasal mucosa edematous and erythematous bilaterally with thick, yellow discharge.  Oropharynx erythematous without exudate. CV: RRR with no murmurs appreciated Pulm: NWOB, CTAB with no crackles, wheezes, or rhonchi  Assessment/Plan:  Sinusitis Given the symptoms have been persistent for 2 weeks, and are worsening, we will start a course of Augmentin today.  No red flags.  Recommended good oral hydration.  Recommended Tylenol/Motrin as needed.  Discussed reasons to return to care.  Follow-up as needed.  Algis Greenhouse. Jerline Pain, MD 01/17/2018 2:36 PM

## 2018-01-17 NOTE — Patient Instructions (Signed)
It was very nice to see you today!  You have a sinus infection.  Please start Augmentin.  Stay well-hydrated.  You can take Tylenol/Motrin as needed.  Let me know if your symptoms worsen or do not improve the next few days.  Take care, Dr Jerline Pain

## 2018-02-22 ENCOUNTER — Other Ambulatory Visit: Payer: Self-pay

## 2018-02-22 MED ORDER — METOPROLOL SUCCINATE ER 25 MG PO TB24: 25.0000 mg | ORAL_TABLET | Freq: Every day | ORAL | 3 refills | Status: DC

## 2018-03-09 ENCOUNTER — Other Ambulatory Visit: Payer: Self-pay | Admitting: Family Medicine

## 2018-03-30 DIAGNOSIS — R52 Pain, unspecified: Secondary | ICD-10-CM | POA: Diagnosis not present

## 2018-04-05 MED FILL — oxyCODONE HCL 5 MG/5ML SOLN: 5 | 24 days supply | Qty: 2200 | Fill #0

## 2018-04-25 MED FILL — fentaNYL 50 MCG/HR PT72: 50 | 30 days supply | Qty: 10 | Fill #0

## 2018-04-28 MED FILL — oxyCODONE HCL 5 MG/5ML SOLN: 5 | 24 days supply | Qty: 2200 | Fill #0

## 2018-05-10 ENCOUNTER — Ambulatory Visit: Payer: Self-pay | Admitting: Family Medicine

## 2018-05-20 MED FILL — oxyCODONE HCL 5 MG/5ML SOLN: 5 | 24 days supply | Qty: 2200 | Fill #0

## 2018-06-02 MED FILL — fentaNYL 50 MCG/HR PT72: 50 | 30 days supply | Qty: 10 | Fill #0

## 2018-06-11 ENCOUNTER — Other Ambulatory Visit: Payer: Self-pay | Admitting: Family Medicine

## 2018-06-15 MED FILL — oxyCODONE HCL 5 MG/5ML SOLN: 5 | 25 days supply | Qty: 2200 | Fill #0

## 2018-06-21 ENCOUNTER — Telehealth: Payer: Self-pay | Admitting: *Deleted

## 2018-06-21 NOTE — Telephone Encounter (Signed)
Called patient and lvm for a return call

## 2018-06-21 NOTE — Telephone Encounter (Signed)
Called Dr. Ritta Slot Office to inform that he wasn't ready to go back to the dentist @ this time, his wife told me that she would speak with her husband and maybe now he has changed his mind and would like to to back and see Dr. Enrique Sack, notified dental medicine and spoke with [facility]

## 2018-07-01 MED FILL — fentaNYL 50 MCG/HR PT72: 50 | 30 days supply | Qty: 10 | Fill #0

## 2018-07-08 MED FILL — oxyCODONE HCL 5 MG/5ML SOLN: 5 | 25 days supply | Qty: 2200 | Fill #0

## 2018-07-29 MED FILL — oxyCODONE HCL 5 MG/5ML SOLN: 5 | 25 days supply | Qty: 2200 | Fill #0

## 2018-08-04 DIAGNOSIS — I10 Essential (primary) hypertension: Secondary | ICD-10-CM | POA: Diagnosis not present

## 2018-08-04 DIAGNOSIS — R52 Pain, unspecified: Secondary | ICD-10-CM | POA: Diagnosis not present

## 2018-08-04 MED FILL — fentaNYL 50 MCG/HR PT72: 50 | 30 days supply | Qty: 10 | Fill #0

## 2018-08-23 MED FILL — oxyCODONE HCL 5 MG/5ML SOLN: 5 | 27 days supply | Qty: 2500 | Fill #0

## 2018-09-12 MED FILL — fentaNYL 50 MCG/HR PT72: 50 | 30 days supply | Qty: 10 | Fill #0

## 2018-09-14 ENCOUNTER — Other Ambulatory Visit: Payer: Self-pay | Admitting: Family Medicine

## 2018-09-19 MED FILL — oxyCODONE HCL 5 MG/5ML SOLN: 5 | 28 days supply | Qty: 2500 | Fill #0

## 2018-09-30 ENCOUNTER — Encounter: Payer: Self-pay | Admitting: Internal Medicine

## 2018-10-12 MED FILL — fentaNYL 50 MCG/HR PT72: 50 | 30 days supply | Qty: 10 | Fill #0

## 2018-10-15 MED FILL — oxyCODONE HCL 5 MG/5ML SOLN: 5 | 27 days supply | Qty: 2500 | Fill #0

## 2018-10-19 ENCOUNTER — Other Ambulatory Visit: Payer: Self-pay | Admitting: Family Medicine

## 2018-10-19 NOTE — Telephone Encounter (Signed)
Please see below.

## 2018-10-19 NOTE — Telephone Encounter (Signed)
Pt doesn't take them often but has finally run out and had been working in his yard and is now cramping and would like a refill for POTASSIUM CHLORIDE CR PO Sent to   [id number] - [place], [place] - [address] [phone] (Phone) [phone] (Fax)

## 2018-10-20 NOTE — Telephone Encounter (Signed)
FYI  Spoke to pt spouse [name] and notified to reach our to Dr.Squire office. [name] also advised that I will inform Dr.Hunter to see if he wants to start pt on Rx but advised that an appointment was needed. I inform [name] that Dr. Yong Channel is out of the office.  I recommended a ov or vv with another provider at a different office but spouse declined and said pt was set up by [facility] for 8/31/202.  I asked if pt can wait that long she stated "yes he will be okay and he needs blood work anyway". I advised if sx worsen or persist to take pt to local UC or ED. [name] verbalized understanding and stated she will reach out to Dr. Isidore Moos if anything else is needed.

## 2018-10-20 NOTE — Telephone Encounter (Signed)
Called pt no answer. Pt needs to reach out to Dr. Peyton Bottoms for refills as she has been the one handling K+.

## 2018-10-24 NOTE — Telephone Encounter (Signed)
Spoke with pt's wife, [name] and advised of PCP recommendation.

## 2018-10-24 NOTE — Telephone Encounter (Signed)
Called pt and left VM to call the office.  

## 2018-10-31 ENCOUNTER — Ambulatory Visit: Payer: Medicare HMO | Admitting: Family Medicine

## 2018-11-02 ENCOUNTER — Other Ambulatory Visit: Payer: Self-pay | Admitting: Family Medicine

## 2018-11-02 DIAGNOSIS — R030 Elevated blood-pressure reading, without diagnosis of hypertension: Secondary | ICD-10-CM | POA: Diagnosis not present

## 2018-11-02 DIAGNOSIS — C099 Malignant neoplasm of tonsil, unspecified: Secondary | ICD-10-CM | POA: Diagnosis not present

## 2018-11-02 DIAGNOSIS — Z5181 Encounter for therapeutic drug level monitoring: Secondary | ICD-10-CM | POA: Diagnosis not present

## 2018-11-02 DIAGNOSIS — R52 Pain, unspecified: Secondary | ICD-10-CM | POA: Diagnosis not present

## 2018-11-02 DIAGNOSIS — Z79899 Other long term (current) drug therapy: Secondary | ICD-10-CM | POA: Diagnosis not present

## 2018-11-02 DIAGNOSIS — Z79891 Long term (current) use of opiate analgesic: Secondary | ICD-10-CM | POA: Diagnosis not present

## 2018-11-11 MED FILL — fentaNYL 50 MCG/HR PT72: 50 | 30 days supply | Qty: 10 | Fill #0

## 2018-11-12 MED FILL — oxyCODONE HCL 5 MG/5ML SOLN: 5 | 28 days supply | Qty: 2500 | Fill #0

## 2018-11-14 ENCOUNTER — Other Ambulatory Visit: Payer: Self-pay | Admitting: Family Medicine

## 2018-11-14 NOTE — Telephone Encounter (Signed)
Last med check 11/09/17, needs OV. Please call to schedule. Thanks!

## 2018-11-15 NOTE — Telephone Encounter (Signed)
Attempted to call patient, phone kept ringing, no vociemail. Patient needs to schedule appointment in next available slot and can be added to the wait list and a message can be sent to clinical team to advise.

## 2018-12-08 ENCOUNTER — Other Ambulatory Visit: Payer: Self-pay | Admitting: Family Medicine

## 2018-12-12 MED FILL — fentaNYL 50 MCG/HR PT72: 50 | 30 days supply | Qty: 10 | Fill #0

## 2018-12-12 MED FILL — oxyCODONE HCL 5 MG/5ML SOLN: 5 | 28 days supply | Qty: 2500 | Fill #0

## 2019-01-03 ENCOUNTER — Telehealth: Payer: Self-pay | Admitting: Family Medicine

## 2019-01-03 NOTE — Telephone Encounter (Signed)
I left a message asking the patient to call and schedule Medicare AWV with Courtney ([facility]).  If patient calls back, please schedule Medicare Wellness Visit at next available opening.  VDM (Dee-Dee) °

## 2019-01-09 MED FILL — fentaNYL 50 MCG/HR PT72: 50 | 30 days supply | Qty: 10 | Fill #0

## 2019-01-11 NOTE — Progress Notes (Signed)
error 

## 2019-01-12 ENCOUNTER — Other Ambulatory Visit: Payer: Self-pay | Admitting: Family Medicine

## 2019-01-12 NOTE — Progress Notes (Signed)
  Radiation Oncology         [phone] ________________________________  Name: Kevin Mcclain MRN: [id number]  Date: 01/13/2019  DOB: 1945/03/05   I tried calling patient today for telemedicine follow-up but I could not reach him.  I left him a message.  We will try to reschedule him.  -----------------------------------  Eppie Gibson, MD

## 2019-01-13 ENCOUNTER — Ambulatory Visit
Admission: RE | Admit: 2019-01-13 | Discharge: 2019-01-13 | Disposition: A | Discharge status: Home / Self Care | Payer: Medicare HMO | Source: Ambulatory Visit | Attending: Radiation Oncology | Admitting: Radiation Oncology

## 2019-01-13 ENCOUNTER — Encounter: Payer: Self-pay | Admitting: Radiation Oncology

## 2019-01-13 DIAGNOSIS — C090 Malignant neoplasm of tonsillar fossa: Secondary | ICD-10-CM

## 2019-01-19 DIAGNOSIS — I10 Essential (primary) hypertension: Secondary | ICD-10-CM | POA: Diagnosis not present

## 2019-02-06 MED FILL — oxyCODONE HCL 5 MG/5ML SOLN: 5 | 27 days supply | Qty: 2500 | Fill #0

## 2019-02-06 MED FILL — fentaNYL 50 MCG/HR PT72: 50 | 30 days supply | Qty: 10 | Fill #0

## 2019-03-08 MED FILL — fentaNYL 50 MCG/HR PT72: 50 | 30 days supply | Qty: 10 | Fill #0

## 2019-03-08 MED FILL — oxyCODONE HCL 5 MG/5ML SOLN: 5 | 27 days supply | Qty: 2500 | Fill #0

## 2019-03-16 ENCOUNTER — Other Ambulatory Visit: Payer: Self-pay | Admitting: Family Medicine

## 2019-03-16 NOTE — Telephone Encounter (Signed)
Last OV 01/17/18 Next OV not scheduled

## 2019-04-06 DIAGNOSIS — Z85818 Personal history of malignant neoplasm of other sites of lip, oral cavity, and pharynx: Secondary | ICD-10-CM | POA: Diagnosis not present

## 2019-04-06 DIAGNOSIS — J310 Chronic rhinitis: Secondary | ICD-10-CM | POA: Diagnosis not present

## 2019-04-08 MED FILL — oxyCODONE HCL 5 MG/5ML SOLN: 5 | 30 days supply | Qty: 2500 | Fill #0

## 2019-04-08 MED FILL — fentaNYL 50 MCG/HR PT72: 50 | 30 days supply | Qty: 10 | Fill #0

## 2019-04-17 DIAGNOSIS — C099 Malignant neoplasm of tonsil, unspecified: Secondary | ICD-10-CM | POA: Diagnosis not present

## 2019-04-17 DIAGNOSIS — R52 Pain, unspecified: Secondary | ICD-10-CM | POA: Diagnosis not present

## 2019-04-17 DIAGNOSIS — I10 Essential (primary) hypertension: Secondary | ICD-10-CM | POA: Diagnosis not present

## 2019-04-17 DIAGNOSIS — R69 Illness, unspecified: Secondary | ICD-10-CM | POA: Diagnosis not present

## 2019-05-06 MED FILL — oxyCODONE HCL 5 MG/5ML SOLN: 5 | 30 days supply | Qty: 2500 | Fill #0

## 2019-05-06 MED FILL — fentaNYL 50 MCG/HR PT72: 50 | 30 days supply | Qty: 10 | Fill #0

## 2019-05-25 DIAGNOSIS — J310 Chronic rhinitis: Secondary | ICD-10-CM | POA: Diagnosis not present

## 2019-05-25 DIAGNOSIS — H6123 Impacted cerumen, bilateral: Secondary | ICD-10-CM | POA: Diagnosis not present

## 2019-05-25 DIAGNOSIS — H9113 Presbycusis, bilateral: Secondary | ICD-10-CM | POA: Diagnosis not present

## 2019-05-25 DIAGNOSIS — L821 Other seborrheic keratosis: Secondary | ICD-10-CM | POA: Diagnosis not present

## 2019-05-25 DIAGNOSIS — Z85818 Personal history of malignant neoplasm of other sites of lip, oral cavity, and pharynx: Secondary | ICD-10-CM | POA: Diagnosis not present

## 2019-05-29 DIAGNOSIS — L821 Other seborrheic keratosis: Secondary | ICD-10-CM | POA: Diagnosis not present

## 2019-05-29 DIAGNOSIS — C44319 Basal cell carcinoma of skin of other parts of face: Secondary | ICD-10-CM | POA: Diagnosis not present

## 2019-05-29 DIAGNOSIS — C44399 Other specified malignant neoplasm of skin of other parts of face: Secondary | ICD-10-CM | POA: Diagnosis not present

## 2019-05-29 DIAGNOSIS — C44311 Basal cell carcinoma of skin of nose: Secondary | ICD-10-CM | POA: Diagnosis not present

## 2019-06-01 ENCOUNTER — Telehealth: Payer: Self-pay | Admitting: Family Medicine

## 2019-06-01 NOTE — Telephone Encounter (Signed)
Called pt to schedule AWV w/ Loma Sousa. No answer

## 2019-06-06 MED FILL — oxyCODONE HCL 5 MG/5ML SOLN: 5 | 30 days supply | Qty: 2500 | Fill #0

## 2019-06-06 MED FILL — fentaNYL 50 MCG/HR PT72: 50 | 30 days supply | Qty: 10 | Fill #0

## 2019-06-15 ENCOUNTER — Telehealth: Payer: Self-pay | Admitting: Family Medicine

## 2019-06-15 NOTE — Telephone Encounter (Signed)
Left message for patient to call back and schedule Medicare Annual Wellness Visit (AWV) either virtually/audio only OR in office. Whatever the patients preference is.  Last AWV 01/19/14; please schedule at anytime with [facility] at [facility].

## 2019-06-27 ENCOUNTER — Other Ambulatory Visit: Payer: Self-pay | Admitting: Family Medicine

## 2019-06-29 DIAGNOSIS — C099 Malignant neoplasm of tonsil, unspecified: Secondary | ICD-10-CM | POA: Diagnosis not present

## 2019-06-29 DIAGNOSIS — R69 Illness, unspecified: Secondary | ICD-10-CM | POA: Diagnosis not present

## 2019-07-05 MED FILL — oxyCODONE HCL 5 MG/5ML SOLN: 5 | 30 days supply | Qty: 2500 | Fill #0

## 2019-07-05 MED FILL — fentaNYL 50 MCG/HR PT72: 50 | 30 days supply | Qty: 10 | Fill #0

## 2019-07-09 ENCOUNTER — Other Ambulatory Visit: Payer: Self-pay | Admitting: Family Medicine

## 2019-07-10 ENCOUNTER — Encounter: Payer: Self-pay | Admitting: Family Medicine

## 2019-07-10 ENCOUNTER — Other Ambulatory Visit: Payer: Self-pay

## 2019-07-10 ENCOUNTER — Ambulatory Visit (INDEPENDENT_AMBULATORY_CARE_PROVIDER_SITE_OTHER): Payer: Medicare HMO | Admitting: Family Medicine

## 2019-07-10 ENCOUNTER — Ambulatory Visit (INDEPENDENT_AMBULATORY_CARE_PROVIDER_SITE_OTHER): Payer: Medicare HMO

## 2019-07-10 VITALS — BP 132/74 | HR 60 | Temp 97.9°F | Ht 62.0 in | Wt 141.0 lb

## 2019-07-10 DIAGNOSIS — E538 Deficiency of other specified B group vitamins: Secondary | ICD-10-CM

## 2019-07-10 DIAGNOSIS — R112 Nausea with vomiting, unspecified: Secondary | ICD-10-CM

## 2019-07-10 DIAGNOSIS — G620 Drug-induced polyneuropathy: Secondary | ICD-10-CM

## 2019-07-10 DIAGNOSIS — Z0000 Encounter for general adult medical examination without abnormal findings: Secondary | ICD-10-CM

## 2019-07-10 DIAGNOSIS — E039 Hypothyroidism, unspecified: Secondary | ICD-10-CM | POA: Diagnosis not present

## 2019-07-10 DIAGNOSIS — R351 Nocturia: Secondary | ICD-10-CM

## 2019-07-10 DIAGNOSIS — M1000 Idiopathic gout, unspecified site: Secondary | ICD-10-CM | POA: Diagnosis not present

## 2019-07-10 DIAGNOSIS — C099 Malignant neoplasm of tonsil, unspecified: Secondary | ICD-10-CM

## 2019-07-10 DIAGNOSIS — E782 Mixed hyperlipidemia: Secondary | ICD-10-CM

## 2019-07-10 DIAGNOSIS — K219 Gastro-esophageal reflux disease without esophagitis: Secondary | ICD-10-CM

## 2019-07-10 DIAGNOSIS — R69 Illness, unspecified: Secondary | ICD-10-CM | POA: Diagnosis not present

## 2019-07-10 DIAGNOSIS — E1142 Type 2 diabetes mellitus with diabetic polyneuropathy: Secondary | ICD-10-CM | POA: Diagnosis not present

## 2019-07-10 DIAGNOSIS — F325 Major depressive disorder, single episode, in full remission: Secondary | ICD-10-CM

## 2019-07-10 DIAGNOSIS — I10 Essential (primary) hypertension: Secondary | ICD-10-CM

## 2019-07-10 DIAGNOSIS — D692 Other nonthrombocytopenic purpura: Secondary | ICD-10-CM | POA: Diagnosis not present

## 2019-07-10 DIAGNOSIS — I712 Thoracic aortic aneurysm, without rupture: Secondary | ICD-10-CM

## 2019-07-10 DIAGNOSIS — I700 Atherosclerosis of aorta: Secondary | ICD-10-CM | POA: Insufficient documentation

## 2019-07-10 DIAGNOSIS — I7121 Aneurysm of the ascending aorta, without rupture: Secondary | ICD-10-CM

## 2019-07-10 DIAGNOSIS — Z85819 Personal history of malignant neoplasm of unspecified site of lip, oral cavity, and pharynx: Secondary | ICD-10-CM

## 2019-07-10 MED ORDER — PROCHLORPERAZINE MALEATE 10 MG PO TABS: 10.0000 mg | ORAL_TABLET | Freq: Four times a day (QID) | ORAL | 5 refills | Status: DC | PRN

## 2019-07-10 MED ORDER — METFORMIN HCL 500 MG PO TABS: ORAL_TABLET | ORAL | 1 refills | Status: DC

## 2019-07-10 MED ORDER — RAMIPRIL 5 MG PO CAPS: ORAL_CAPSULE | ORAL | 3 refills | Status: DC

## 2019-07-10 MED ORDER — COLCHICINE 0.6 MG PO TABS: ORAL_TABLET | ORAL | 2 refills | Status: DC

## 2019-07-10 NOTE — Patient Instructions (Signed)
Health Maintenance Due  Topic Date Due  . 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  Plan was 1 year repeat so should have been done in March 23, 2017- he thinks he had CT imaging since that time- he is going to check in his records at home- if he has not had this I would repeat CT angiogram of chest  Please stop by lab before you go If you have mychart- we will send your results within 3 business days of [place] receiving them.  If you do not have mychart- we will call you about results within 5 business days of [place] receiving them.    Recommended follow up: Return in about 6 months (around 01/10/2020) for follow up- or sooner if needed.

## 2019-07-10 NOTE — Progress Notes (Signed)
Subjective:   Kevin Mcclain is a 74 y.o. male who presents for Medicare Annual/Subsequent preventive examination.  Review of Systems:   Cardiac Risk Factors include: advanced age (>78men, >2 women);diabetes mellitus;male gender;hypertension;dyslipidemia    Objective:    Vitals: BP 132/74   Pulse 60   Temp 97.9 F (36.6 C) (Temporal)   Ht 5\' 2"  (1.575 m)   Wt 141 lb (64 kg)   SpO2 99%   BMI 25.79 kg/m   Body mass index is 25.79 kg/m.  Advanced Directives 01/07/2018 07/02/2017 06/16/2017 03/16/2017 12/17/2016 09/08/2016 06/23/2016  Does Patient Have a Medical Advance Directive? No No No No No No No  Type of Advance Directive - - - - - - -  Copy of Healthcare Power of Attorney in Chart? - - - - - - -  Would patient like information on creating a medical advance directive? No - Patient declined No - Patient declined No - Patient declined No - Patient declined No - Patient declined Yes (MAU/Ambulatory/Procedural Areas - Information given) No - Patient declined    Tobacco Social History   Tobacco Use  Smoking Status Never Smoker  Smokeless Tobacco Never Used     Counseling given: Not Answered   Clinical Intake:  Pre-visit preparation completed: Yes  Pain : No/denies pain  Diabetes: Yes CBG done?: No Did pt. bring in CBG monitor from home?: No  How often do you need to have someone help you when you read instructions, pamphlets, or other written materials from your doctor or pharmacy?: 1 - Never  Interpreter Needed?: No  Information entered by :: Denman George LPN  Past Medical History:  Diagnosis Date  . Allergy   . Arthritis   . CAD (coronary artery disease)    severe CAD-s/p MI 2000 with PTCA, s/p CABG 2005-by Dr Vanetta Mulders at [place] EF 50-55%  . 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 Diabetes mellitus type II   . Diverticulosis   . Gastropathy 2017   reactive  . GERD (gastroesophageal reflux disease)    zantac- prn    . Gout   . History of radiation therapy 12/03/14- 01/18/15   Right Tonsil and Bilateral neck.   . Hyperlipidemia   . Hyperplastic colon polyp   . Hypertension   . Kidney stones   . Rheumatoid arthritis ([place])   . Shingles outbreak 04/23/15   left shoulder and left arm  . Vertigo    Past Surgical History:  Procedure Laterality Date  . CHOLECYSTECTOMY  2012  . COLONOSCOPY W/ POLYPECTOMY  2015  . CORONARY ARTERY BYPASS GRAFT  2005   6 grafts  . CYSTOSCOPY     kidney stone removal  . IR GASTROSTOMY TUBE REMOVAL  06/25/2016  . LITHOTRIPSY    . MULTIPLE EXTRACTIONS WITH ALVEOLOPLASTY N/A 11/14/2014   Procedure: Extraction of tooth #2 and 31 with alveoloplasty after sectioning of bridge at distal #29 with full mouth debridement of remaining dention;  Surgeon: Lenn Cal, DDS;  Location: [facility];  Service: Oral Surgery;  Laterality: N/A;  . NASAL SINUS SURGERY Right 11/14/2014   Procedure: RIGHT ENDOSCOPIC ANTROSTOMY AND ANTERIOR ETHMOIDECTOMY ;  Surgeon: Jodi Marble, MD;  Location: [facility] OR;  Service: ENT;  Laterality: Right;   Family History  Problem Relation Age of Onset  . Heart attack Mother        deceased age 17 secondary  . Colon cancer Father   . Stroke Father  deceased age 51  . Heart disease Father        heart attack  . Stroke Sister 15  . Coronary artery disease Brother 48       s/p bypass surgery /stent   Social History   Socioeconomic History  . Marital status: Married    Spouse name: Not on file  . Number of children: Not on file  . Years of education: Not on file  . Highest education level: Not on file  Occupational History  . Occupation: Retired   Tobacco Use  . Smoking status: Never Smoker  . Smokeless tobacco: Never Used  Substance and Sexual Activity  . Alcohol use: No  . Drug use: No  . Sexual activity: Not on file  Other Topics Concern  . Not on file  Social History Narrative   Married 35 years in October 2017.       Occupation :   Retired Arboriculturist for [employer].  Worked for [employer]: garden and work in yard   Social Determinants of Radio broadcast assistant Strain:   . Difficulty of Paying Living Expenses:   Food Insecurity:   . Worried About Charity fundraiser in the Last Year:   . Arboriculturist in the Last Year:   Transportation Needs:   . Film/video editor (Medical):   Marland Kitchen Lack of Transportation (Non-Medical):   Physical Activity:   . Days of Exercise per Week:   . Minutes of Exercise per Session:   Stress:   . Feeling of Stress :   Social Connections:   . Frequency of Communication with Friends and Family:   . Frequency of Social Gatherings with Friends and Family:   . Attends Religious Services:   . Active Member of Clubs or Organizations:   . Attends Archivist Meetings:   Marland Kitchen Marital Status:     Outpatient Encounter Medications as of 07/10/2019  Medication Sig  . aspirin 81 MG tablet Take 81 mg by mouth daily. Does not take everyday  . atorvastatin (LIPITOR) 10 MG tablet TAKE 1 TABLET BY MOUTH EVERY DAY  . fentaNYL (DURAGESIC - DOSED MCG/HR) 50 MCG/HR Place 1 patch (50 mcg total) onto the skin every 3 (three) days.  Marland Kitchen levothyroxine (SYNTHROID) 75 MCG tablet TAKE 1 TABLET BY MOUTH EVERY DAY  . metFORMIN (GLUCOPHAGE) 500 MG tablet TAKE 1 TABLET BY MOUTH EVERY DAY WITH BREAKFAST  . metoprolol succinate (TOPROL-XL) 25 MG 24 hr tablet TAKE 1 TABLET (25 MG TOTAL) BY MOUTH DAILY. TAKE WITH OR IMMEDIATELY FOLLOWING A MEAL.  . nitroGLYCERIN (NITROSTAT) 0.4 MG SL tablet Place 1 tablet (0.4 mg total) under the tongue every 5 (five) minutes as needed.  Marland Kitchen OVER THE COUNTER MEDICATION Place 1 drop into both eyes 3 (three) times daily as needed (dry eyes/ irritation). Clear Cooling Eye Drops  . prochlorperazine (COMPAZINE) 10 MG tablet Take 1 tablet (10 mg total) by mouth every 6 (six) hours as needed for nausea or vomiting.  . ramipril (ALTACE) 5 MG capsule Take 2  pills in the morning, Take 1 pill in the evening  . sertraline (ZOLOFT) 100 MG tablet Take 1 tablet (100 mg total) by mouth daily. Patient must keep his appt to get more refills.  . sodium fluoride (PREVIDENT 5000 PLUS) 1.1 % CREA dental cream Apply to tooth brush. Brush teeth for 2 minutes. Spit out excess-DO NOT swallow. Repeat nightly. (Patient not  taking: Reported on 07/10/2019)  . vitamin E 400 UNIT capsule Take 400 IU daily x 1 week, then 400 IU BID  . [DISCONTINUED] amoxicillin-clavulanate (AUGMENTIN) 875-125 MG tablet Take 1 tablet by mouth 2 (two) times daily.  . [DISCONTINUED] oxyCODONE (ROXICODONE) 5 MG/5ML solution Take 10-15 mLs (10-15 mg total) by mouth every 4 (four) hours as needed for severe pain.  . [DISCONTINUED] ramipril (ALTACE) 10 MG capsule TAKE 1 CAPSULE BY MOUTH EVERY DAY   No facility-administered encounter medications on file as of 07/10/2019.    Activities of Daily Living In your present state of health, do you have any difficulty performing the following activities: 07/10/2019  Hearing? Y  Vision? N  Difficulty concentrating or making decisions? N  Walking or climbing stairs? N  Dressing or bathing? N  Doing errands, shopping? N  Preparing Food and eating ? N  Using the Toilet? N  In the past six months, have you accidently leaked urine? N  Do you have problems with loss of bowel control? N  Managing your Medications? N  Managing your Finances? N  Housekeeping or managing your Housekeeping? N  Some recent data might be hidden    Patient Care Team: Marin Olp, MD as PCP - General (Family Medicine) Eppie Gibson, MD as Attending Physician (Radiation Oncology) Izora Gala, MD as Consulting Physician (Otolaryngology) Clydell Hakim, MD as Consulting Physician (Anesthesiology) Leroux-Martinez, Nancy Marus, AUD as Audiologist (Audiology)   Assessment:   This is a routine wellness examination for Kevin Mcclain.  Exercise Activities and Dietary  recommendations Current Exercise Habits: The patient does not participate in regular exercise at present  Goals   None     Fall Risk Fall Risk  07/10/2019 07/02/2017 09/08/2016 11/20/2015 05/17/2015  Falls in the past year? 0 No No No No  Number falls in past yr: 0 - - - -  Injury with Fall? 0 - - - -  Follow up Falls evaluation completed;Education provided;Falls prevention discussed - - - -   Is the patient's home free of loose throw rugs in walkways, pet beds, electrical cords, etc?   yes      Grab bars in the bathroom? yes      Handrails on the stairs?   yes      Adequate lighting?   yes  Timed Get Up and Go Performed: completed and within normal timeframe; no gait abnormalities noted   Depression Screen PHQ 2/9 Scores 07/10/2019 11/09/2017 07/02/2017 03/17/2017  PHQ - 2 Score 0 1 0 5  PHQ- 9 Score - 4 - 14    Cognitive Function- no cognitive concerns at this time    6CIT Screen 07/10/2019  What Year? 0 points  What month? 0 points  What time? 0 points  Count back from 20 0 points  Months in reverse 0 points  Repeat phrase 0 points  Total Score 0    Immunization History  Administered Date(s) Administered  . Influenza Split 12/30/2010, 11/18/2011  . Influenza Whole 12/22/2006, 12/27/2007, 11/05/2009  . Influenza, High Dose Seasonal PF 12/26/2015  . Influenza,inj,Quad PF,6+ Mos 12/06/2013, 12/17/2016  . Influenza-Unspecified 11/01/2014  . Pneumococcal Conjugate-13 10/10/2014  . Pneumococcal Polysaccharide-23 01/24/2007, 10/24/2012  . Td 01/24/2007    Qualifies for Shingles Vaccine? Discussed and patient will check with pharmacy for coverage.  Patient education handout provided   Screening Tests Health Maintenance  Topic Date Due  . OPHTHALMOLOGY EXAM  Never done  . HEMOGLOBIN A1C  05/10/2018  . FOOT EXAM  11/10/2018  .  COVID-19 Vaccine (1) 07/26/2019 (Originally 08/08/1961)  . TETANUS/TDAP  07/09/2020 (Originally 01/23/2017)  . INFLUENZA VACCINE  10/01/2019  .  COLONOSCOPY  11/16/2023  . Hepatitis C Screening  Completed  . PNA vac Low Risk Adult  Completed   Cancer Screenings: Lung: Low Dose CT Chest recommended if Age 57-80 years, 30 pack-year currently smoking OR have quit w/in 15years. Patient does not qualify. Colorectal: colonoscopy 11/15/13      Plan:  I have personally reviewed and addressed the Medicare Annual Wellness questionnaire and have noted the following in the patient's chart:  A. Medical and social history B. Use of alcohol, tobacco or illicit drugs  C. Current medications and supplements D. Functional ability and status E.  Nutritional status F.  Physical activity G. Advance directives H. List of other physicians I.  Hospitalizations, surgeries, and ER visits in previous 12 months J.  [place] such as hearing and vision if needed, cognitive and depression L. Referrals, records requested, and appointments- patient will self refer for diabetic eye exam   In addition, I have reviewed and discussed with patient certain preventive protocols, quality metrics, and best practice recommendations. A written personalized care plan for preventive services as well as general preventive health recommendations were provided to patient.   Signed,  Denman George, LPN  Nurse Health Advisor   Nurse Notes: no additional

## 2019-07-10 NOTE — Progress Notes (Signed)
Phone: [phone]   Subjective:  Patient presents today for their annual physical. Chief complaint-noted.   See problem oriented charting- ROS- full  review of systems was completed and negative  except for: fatigue, chronic throat pain, occasional mild headache with weather changes  The following were reviewed and entered/updated in epic: Past Medical History:  Diagnosis Date  . Allergy   . Arthritis   . CAD (coronary artery disease)    severe CAD-s/p MI 2000 with PTCA, s/p CABG 2005-by Dr Vanetta Mulders at [facility] EF 50-55%  . 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 Diabetes mellitus type II   . Diverticulosis   . Gastropathy 2017   reactive  . GERD (gastroesophageal reflux disease)    zantac- prn  . Gout   . History of radiation therapy 12/03/14- 01/18/15   Right Tonsil and Bilateral neck.   . Hyperlipidemia   . Hyperplastic colon polyp   . Hypertension   . Kidney stones   . Rheumatoid arthritis ([place])   . Shingles outbreak 04/23/15   left shoulder and left arm  . Vertigo    Patient Active Problem List   Diagnosis Date Noted  . Ascending aortic aneurysm ([place]) 03/25/2016    Priority: High  . 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 Osteoarthritis 11/18/2010    Priority: High  . CAD (coronary artery disease) 10/11/2008    Priority: High  . Type 2 diabetes mellitus with peripheral neuropathy ([place]) 01/24/2007    Priority: High  . BPH associated with nocturia 10/30/2016    Priority: Medium  . Hypothyroidism 09/11/2016    Priority: Medium  . Depression, major, single episode, moderate ([place]) 04/02/2016    Priority: Medium  . Diabetic neuropathy ([place]) 12/26/2015    Priority: Medium  . Gout 03/28/2007    Priority: Medium  . HYPERLIPIDEMIA 01/24/2007    Priority: Medium  . Essential hypertension 01/24/2007    Priority: Medium  . Senile purpura ([place]) 03/17/2017    Priority: Low  . B12  deficiency 01/15/2017    Priority: Low  . Herpes zoster 07/15/2015    Priority: Low  . GERD (gastroesophageal reflux disease) 06/10/2015    Priority: Low  . Arthropathy 03/10/2013    Priority: Low  . Low back pain 11/26/2011    Priority: Low  . Kidney stone 11/04/2011    Priority: Low  . Drug-induced polyneuropathy ([place]) 07/10/2019  . Atherosclerosis of aorta ([place]) 07/10/2019   Past Surgical History:  Procedure Laterality Date  . CHOLECYSTECTOMY  2012  . COLONOSCOPY W/ POLYPECTOMY  2015  . CORONARY ARTERY BYPASS GRAFT  2005   6 grafts  . CYSTOSCOPY     kidney stone removal  . IR GASTROSTOMY TUBE REMOVAL  06/25/2016  . LITHOTRIPSY    . MULTIPLE EXTRACTIONS WITH ALVEOLOPLASTY N/A 11/14/2014   Procedure: Extraction of tooth #2 and 31 with alveoloplasty after sectioning of bridge at distal #29 with full mouth debridement of remaining dention;  Surgeon: Lenn Cal, DDS;  Location: [facility];  Service: Oral Surgery;  Laterality: N/A;  . NASAL SINUS SURGERY Right 11/14/2014   Procedure: RIGHT ENDOSCOPIC ANTROSTOMY AND ANTERIOR ETHMOIDECTOMY ;  Surgeon: Jodi Marble, MD;  Location: [facility] OR;  Service: ENT;  Laterality: Right;    Family History  Problem Relation Age of Onset  . Heart attack Mother        deceased age 59 secondary  .  Colon cancer Father   . Stroke Father        deceased age 19  . Heart disease Father        heart attack  . Stroke Sister 98  . Coronary artery disease Brother 18       s/p bypass surgery /stent    Medications- reviewed and updated Current Outpatient Medications  Medication Sig Dispense Refill  . aspirin 81 MG tablet Take 81 mg by mouth daily. Does not take everyday    . atorvastatin (LIPITOR) 10 MG tablet TAKE 1 TABLET BY MOUTH EVERY DAY 90 tablet 3  . fentaNYL (DURAGESIC - DOSED MCG/HR) 50 MCG/HR Place 1 patch (50 mcg total) onto the skin every 3 (three) days. 10 patch 0  . levothyroxine (SYNTHROID) 75 MCG tablet TAKE 1 TABLET BY MOUTH EVERY DAY  90 tablet 0  . metFORMIN (GLUCOPHAGE) 500 MG tablet TAKE 1 TABLET BY MOUTH EVERY DAY WITH BREAKFAST and DINNER 180 tablet 1  . metoprolol succinate (TOPROL-XL) 25 MG 24 hr tablet TAKE 1 TABLET (25 MG TOTAL) BY MOUTH DAILY. TAKE WITH OR IMMEDIATELY FOLLOWING A MEAL. 90 tablet 3  . OVER THE COUNTER MEDICATION Place 1 drop into both eyes 3 (three) times daily as needed (dry eyes/ irritation). Clear Cooling Eye Drops    . prochlorperazine (COMPAZINE) 10 MG tablet Take 1 tablet (10 mg total) by mouth every 6 (six) hours as needed for nausea or vomiting. 30 tablet 5  . ramipril (ALTACE) 5 MG capsule Take 2 pills in the morning 180 capsule 3  . sertraline (ZOLOFT) 100 MG tablet Take 1 tablet (100 mg total) by mouth daily. Patient must keep his appt to get more refills. 30 tablet 0  . vitamin E 400 UNIT capsule Take 400 IU daily x 1 week, then 400 IU BID 60 capsule 5  . colchicine 0.6 MG tablet Take 2 tablets at first sign of gout flare and may repeat in 2 hours a single tablet then once daily until symptoms resolve 30 tablet 2  . nitroGLYCERIN (NITROSTAT) 0.4 MG SL tablet Place 1 tablet (0.4 mg total) under the tongue every 5 (five) minutes as needed. 50 tablet 3  . sodium fluoride (PREVIDENT 5000 PLUS) 1.1 % CREA dental cream Apply to tooth brush. Brush teeth for 2 minutes. Spit out excess-DO NOT swallow. Repeat nightly. (Patient not taking: Reported on 07/10/2019) 1 Tube prn   No current facility-administered medications for this visit.    Allergies-reviewed and updated Allergies  Allergen Reactions  . Benzonatate Swelling    "swells my throat"  . Codeine Itching  . Sitagliptin Phosphate Itching    Social History   Social History Narrative   Married 35 years in October 2017.       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 TM normal. Bridge of nose normal. Eyelids normal.  Neck: no thyromegaly or cervical lymphadenopathy  CV: RRR no murmurs rubs or gallops Lungs: CTAB no crackles, wheeze, rhonchi Abdomen: soft/nontender/nondistended/normal bowel sounds. No rebound or guarding.  Ext: no edema Skin:  warm, dry Neuro: grossly normal, moves all extremities, PERRLA    Assessment and Plan  74 y.o. male presenting for annual physical.  Health Maintenance counseling: 1. Anticipatory guidance: Patient counseled regarding regular dental exams q6 months, eye exams yearly,  avoiding smoking and second hand smoke, limiting alcohol to 2 beverages per day.   2. Risk factor reduction:  Advised patient of need for regular exercise and diet rich and fruits and vegetables to reduce risk of heart attack and stroke. Exercise- walking and remains active in yard- gets tired in yard but does as much as he can. Diet-No special diet.  Has been able to maintain weight and actually gain some recently which is encouraging Wt Readings from Last 3 Encounters:  07/10/19 141 lb (64 kg)  07/10/19 141 lb (64 kg)  01/17/18 133 lb 3.2 oz (60.4 kg)  3. Immunizations/screenings/ancillary studies- he has decided to hold off on covid 19 vaccination for now- I encouraged him to get this completed but he declines for now. Declines shingrix for now Immunization History  Administered Date(s) Administered  . Influenza Split 12/30/2010, 11/18/2011  . Influenza Whole 12/22/2006, 12/27/2007, 11/05/2009  . Influenza, High Dose Seasonal PF 12/26/2015  . Influenza,inj,Quad PF,6+ Mos 12/06/2013, 12/17/2016  . Influenza-Unspecified 11/01/2014  . Pneumococcal Conjugate-13 10/10/2014  . Pneumococcal Polysaccharide-23 01/24/2007, 10/24/2012  . Td 01/24/2007   4. Prostate cancer screening- past age based screening  recommendations Lab Results  Component Value Date   PSA 1.94 12/04/2013   PSA 1.26 04/30/2011   PSA 1.02 06/26/2008   5. Colon cancer screening - Due in Sept 16, 2025 for 10-year repeat 6. Skin cancer screening-  No recent derm visits- had a cancer removed from face by ENT recently though. advised regular sunscreen use. Denies worrisome, changing, or new skin lesions.  7. Never smoker  Status of chronic or acute concerns   Gets fatigued working in the yard- gradually worsening since cancer treatment. No chest pain or shortness of breath.  -offered to update EKG but he thinks he had one recently ( I dont see in cpu)  Ascending aortic aneurysm noted CT angiogram March 23, 2016 at 4.0 cm.  Plan was 1 year repeat so should have been done in March 23, 2017- he thinks he had CT imaging since that time- he is going to check in his records at home- if he has not had this I would repeat CT angiogram of chest- I wrote this on his AVS and he will check on this.   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 Constance Holster. Had 35 treatments at the time - Dr. Maryjean Ka is treating chronic pain on fentanyl patch and oxycodone liquid - compazine elps with nausea  Type 2 diabetes mellitus with peripheral neuropathy ([place])- occasional diarrhea on 500 in AM and 500mg  at night. Checks about 3x a week and usually 90-120.  Lab Results  Component Value Date   HGBA1C 6.1 11/09/2017   Diabetic polyneuropathy associated with type 2 diabetes mellitus (HCC) Drug-induced (radiation) polyneuropathy (HCC) -Neuropathy likely related to both diabetes and prior radiation. Has pain in hands and some in feet. Does not want to take more medicine for this at present though.  - voltaren gel doesn't work  Major depressive disorder with single episode, in full remission ([place]) -PHQ-9 at goal of 5 or less on Zoloft 100 mg-continue current medication  Senile purpura ([place])-  stable- noted- update cbc. Shows me bruising on left arm  where sleeve got twisted and he bruises up.   Atherosclerosis of aorta (HCC), Chronic HYPERLIPIDEMIA -Continue risk factor modification for aortic atherosclerosis which was incidentally noted in the past.  For this he is on atorvastatin 10 mg daily.  Also takes aspirin for primary prevention of cardiovascular disease.  Update lipid panel with labs  Hypothyroidism, unspecified type -Patient is compliant with levothyroxine 75 mcg.  Update TSH with labs today  Idiopathic gout, unspecified chronicity, unspecified site-  Lab Results  Component Value Date   LABURIC 3.5 (L) 10/10/2014  gout flares despite low uric acid levels. Colchicine has helped in past - will refill  Essential hypertension -Currently on metoprolol 25 mg extended release and on ramipril 5 mg  Blood pressure well controlled today on both of these  -home BP less than 120 which is more ideal with aortic aneurysm history  B12 deficiency- takes by mouth- update b12 level  Gastroesophageal reflux disease without esophagitis- better lately - not requiring medicine. Could try pepcid if needed- he was nervous about zantac with history of cancer which I understand  No history of rheumatoid arthritis- may have been erroneously entered in chart- saw as [place] diagnosis to be evaluated.   Recommended follow up: Return in about 6 months (around 01/10/2020) for follow up- or sooner if needed.  Lab/Order associations: non fasting   ICD-10-CM   1. Preventative health care  Z00.00 CBC with Differential/Platelet    Comprehensive metabolic panel    Lipid panel    Hemoglobin A1c    TSH    Vitamin B12  2. Type 2 diabetes mellitus with peripheral neuropathy (HCC)  E11.42 CBC with Differential/Platelet    Comprehensive metabolic panel    Lipid panel    Hemoglobin A1c  3. Diabetic polyneuropathy associated with type 2 diabetes mellitus (HCC)  E11.42   4. Major depressive disorder with  single episode, in full remission ([place])  F32.5   5. Senile purpura (HCC)  D69.2   6. Drug-induced polyneuropathy (HCC)  G62.0   7. Atherosclerosis of aorta (HCC) Chronic I70.0   8. Hypothyroidism, unspecified type  E03.9 TSH  9. 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 HYPERLIPIDEMIA  E78.2   11. Idiopathic gout, unspecified chronicity, unspecified site  M10.00   12. Essential hypertension  I10   13. B12 deficiency  E53.8 Vitamin B12  14. Gastroesophageal reflux disease without esophagitis  K21.9   15. Nausea and vomiting, intractability of vomiting not specified, unspecified vomiting type  R11.2 prochlorperazine (COMPAZINE) 10 MG tablet  16. Malignant neoplasm of tonsil (HCC)  C09.9 prochlorperazine (COMPAZINE) 10 MG tablet    Meds ordered this encounter  Medications  . metFORMIN (GLUCOPHAGE) 500 MG tablet    Sig: TAKE 1 TABLET BY MOUTH EVERY DAY WITH BREAKFAST and DINNER    Dispense:  180 tablet    Refill:  1  . colchicine 0.6 MG tablet    Sig: Take 2 tablets at first sign of gout flare and may repeat in 2 hours a single tablet then once daily until symptoms resolve    Dispense:  30 tablet    Refill:  2  . ramipril (ALTACE) 5 MG capsule    Sig: Take 2 pills in the morning    Dispense:  180 capsule    Refill:  3  . prochlorperazine (COMPAZINE) 10 MG tablet    Sig: Take 1 tablet (10 mg total) by mouth every 6 (six) hours as needed for  nausea or vomiting.    Dispense:  30 tablet    Refill:  5    Return precautions advised.  Garret Reddish, MD

## 2019-07-10 NOTE — Patient Instructions (Addendum)
Kevin Mcclain , Thank you for taking time to come for your Medicare Wellness Visit. I appreciate your ongoing commitment to your health goals. Please review the following plan we discussed and let me know if I can assist you in the future.   Screening recommendations/referrals: Colorectal Screening: up to date; last 11/15/13  Vision and Dental Exams: Recommended annual ophthalmology exams for early detection of glaucoma and other disorders of the eye Recommended annual dental exams for proper oral hygiene  Diabetic Exams: Diabetic Eye Exam: recommended yearly Diabetic Foot Exam: recommended yearly  Vaccinations: Influenza vaccine: recommended each fall Pneumococcal vaccine: up to date; last 10/10/14 Tdap vaccine: recommended every 10 years; Please call your insurance company to determine your out of pocket expense. You also receive this vaccine at your local pharmacy or Health Dept. Shingles vaccine: You may receive this vaccine at your local pharmacy. (see handout)  Covid vaccine: recommended   Advanced directives: Please bring a copy of your POA (Power of Attorney) and/or Living Will to your next appointment.  Goals: Recommend to drink at least 6-8 8oz glasses of water per day and consume a balanced diet rich in fresh fruits and vegetables.   Next appointment: Please schedule your Annual Wellness Visit with your Nurse Health Advisor in one year.  Preventive Care 76 Years and Older, Male Preventive care refers to lifestyle choices and visits with your health care provider that can promote health and wellness. What does preventive care include?  A yearly physical exam. This is also called an annual well check.  Dental exams once or twice a year.  Routine eye exams. Ask your health care provider how often you should have your eyes checked.  Personal lifestyle choices, including:  Daily care of your teeth and gums.  Regular physical activity.  Eating a healthy  diet.  Avoiding tobacco and drug use.  Limiting alcohol use.  Practicing safe sex.  Taking low doses of aspirin every day if recommended by your health care provider..  Taking vitamin and mineral supplements as recommended by your health care provider. What happens during an annual well check? The services and screenings done by your health care provider during your annual well check will depend on your age, overall health, lifestyle risk factors, and family history of disease. Counseling  Your health care provider may ask you questions about your:  Alcohol use.  Tobacco use.  Drug use.  Emotional well-being.  Home and relationship well-being.  Sexual activity.  Eating habits.  History of falls.  Memory and ability to understand (cognition).  Work and work Statistician. Screening  You may have the following tests or measurements:  Height, weight, and BMI.  Blood pressure.  Lipid and cholesterol levels. These may be checked every 5 years, or more frequently if you are over 60 years old.  Skin check.  Lung cancer screening. You may have this screening every year starting at age 23 if you have a 30-pack-year history of smoking and currently smoke or have quit within the past 15 years.  Fecal occult blood test (FOBT) of the stool. You may have this test every year starting at age 43.  Flexible sigmoidoscopy or colonoscopy. You may have a sigmoidoscopy every 5 years or a colonoscopy every 10 years starting at age 65.  Prostate cancer screening. Recommendations will vary depending on your family history and other risks.  Hepatitis C blood test.  Hepatitis B blood test.  Sexually transmitted disease (STD) testing.  Diabetes screening. This is done  by checking your blood sugar (glucose) after you have not eaten for a while (fasting). You may have this done every 1-3 years.  Abdominal aortic aneurysm (AAA) screening. You may need this if you are a current or former  smoker.  Osteoporosis. You may be screened starting at age 57 if you are at high risk. Talk with your health care provider about your test results, treatment options, and if necessary, the need for more tests. Vaccines  Your health care provider may recommend certain vaccines, such as:  Influenza vaccine. This is recommended every year.  Tetanus, diphtheria, and acellular pertussis (Tdap, Td) vaccine. You may need a Td booster every 10 years.  Zoster vaccine. You may need this after age 52.  Pneumococcal 13-valent conjugate (PCV13) vaccine. One dose is recommended after age 87.  Pneumococcal polysaccharide (PPSV23) vaccine. One dose is recommended after age 33. Talk to your health care provider about which screenings and vaccines you need and how often you need them. This information is not intended to replace advice given to you by your health care provider. Make sure you discuss any questions you have with your health care provider. Document Released: 03/15/2015 Document Revised: 11/06/2015 Document Reviewed: 12/18/2014 Elsevier Interactive Patient Education  2017 [employer] Prevention in the Home Falls can cause injuries. They can happen to people of all ages. There are many things you can do to make your home safe and to help prevent falls. What can I do on the outside of my home?  Regularly fix the edges of walkways and driveways and fix any cracks.  Remove anything that might make you trip as you walk through a door, such as a raised step or threshold.  Trim any bushes or trees on the path to your home.  Use bright outdoor lighting.  Clear any walking paths of anything that might make someone trip, such as rocks or tools.  Regularly check to see if handrails are loose or broken. Make sure that both sides of any steps have handrails.  Any raised decks and porches should have guardrails on the edges.  Have any leaves, snow, or ice cleared regularly.  Use sand or  salt on walking paths during winter.  Clean up any spills in your garage right away. This includes oil or grease spills. What can I do in the bathroom?  Use night lights.  Install grab bars by the toilet and in the tub and shower. Do not use towel bars as grab bars.  Use non-skid mats or decals in the tub or shower.  If you need to sit down in the shower, use a plastic, non-slip stool.  Keep the floor dry. Clean up any water that spills on the floor as soon as it happens.  Remove soap buildup in the tub or shower regularly.  Attach bath mats securely with double-sided non-slip rug tape.  Do not have throw rugs and other things on the floor that can make you trip. What can I do in the bedroom?  Use night lights.  Make sure that you have a light by your bed that is easy to reach.  Do not use any sheets or blankets that are too big for your bed. They should not hang down onto the floor.  Have a firm chair that has side arms. You can use this for support while you get dressed.  Do not have throw rugs and other things on the floor that can make you trip. What can  I do in the kitchen?  Clean up any spills right away.  Avoid walking on wet floors.  Keep items that you use a lot in easy-to-reach places.  If you need to reach something above you, use a strong step stool that has a grab bar.  Keep electrical cords out of the way.  Do not use floor polish or wax that makes floors slippery. If you must use wax, use non-skid floor wax.  Do not have throw rugs and other things on the floor that can make you trip. What can I do with my stairs?  Do not leave any items on the stairs.  Make sure that there are handrails on both sides of the stairs and use them. Fix handrails that are broken or loose. Make sure that handrails are as long as the stairways.  Check any carpeting to make sure that it is firmly attached to the stairs. Fix any carpet that is loose or worn.  Avoid having  throw rugs at the top or bottom of the stairs. If you do have throw rugs, attach them to the floor with carpet tape.  Make sure that you have a light switch at the top of the stairs and the bottom of the stairs. If you do not have them, ask someone to add them for you. What else can I do to help prevent falls?  Wear shoes that:  Do not have high heels.  Have rubber bottoms.  Are comfortable and fit you well.  Are closed at the toe. Do not wear sandals.  If you use a stepladder:  Make sure that it is fully opened. Do not climb a closed stepladder.  Make sure that both sides of the stepladder are locked into place.  Ask someone to hold it for you, if possible.  Clearly mark and make sure that you can see:  Any grab bars or handrails.  First and last steps.  Where the edge of each step is.  Use tools that help you move around (mobility aids) if they are needed. These include:  Canes.  Walkers.  Scooters.  Crutches.  Turn on the lights when you go into a dark area. Replace any light bulbs as soon as they burn out.  Set up your furniture so you have a clear path. Avoid moving your furniture around.  If any of your floors are uneven, fix them.  If there are any pets around you, be aware of where they are.  Review your medicines with your doctor. Some medicines can make you feel dizzy. This can increase your chance of falling. Ask your doctor what other things that you can do to help prevent falls. This information is not intended to replace advice given to you by your health care provider. Make sure you discuss any questions you have with your health care provider. Document Released: 12/13/2008 Document Revised: 07/25/2015 Document Reviewed: 03/23/2014 Elsevier Interactive Patient Education  2017 [employer].

## 2019-07-11 LAB — COMPREHENSIVE METABOLIC PANEL
ALT: 41 U/L (ref 0–53)
AST: 34 U/L (ref 0–37)
Albumin: 4.4 g/dL (ref 3.5–5.2)
Alkaline Phosphatase: 80 U/L (ref 39–117)
BUN: 18 mg/dL (ref 6–23)
CO2: 27 mEq/L (ref 19–32)
Calcium: 9.3 mg/dL (ref 8.4–10.5)
Chloride: 103 mEq/L (ref 96–112)
Creatinine, Ser: 1.07 mg/dL (ref 0.40–1.50)
GFR: 67.21 mL/min (ref >60.00)
Glucose, Bld: 99 mg/dL (ref 70–99)
Potassium: 4.2 mEq/L (ref 3.5–5.1)
Sodium: 138 mEq/L (ref 135–145)
Total Bilirubin: 0.7 mg/dL (ref 0.2–1.2)
Total Protein: 6.8 g/dL (ref 6.0–8.3)

## 2019-07-11 LAB — TSH: TSH: 9.91 u[IU]/mL — ABNORMAL HIGH (ref 0.35–4.50)

## 2019-07-11 LAB — CBC WITH DIFFERENTIAL/PLATELET
Basophils Absolute: 0.1 10*3/uL (ref 0.0–0.1)
Basophils Relative: 0.8 % (ref 0.0–3.0)
Eosinophils Absolute: 0.2 10*3/uL (ref 0.0–0.7)
Eosinophils Relative: 3.3 % (ref 0.0–5.0)
HCT: 40.4 % (ref 39.0–52.0)
Hemoglobin: 14 g/dL (ref 13.0–17.0)
Lymphocytes Relative: 16.4 % (ref 12.0–46.0)
Lymphs Abs: 1.1 10*3/uL (ref 0.7–4.0)
MCHC: 34.7 g/dL (ref 30.0–36.0)
MCV: 89.7 fl (ref 78.0–100.0)
Monocytes Absolute: 0.6 10*3/uL (ref 0.1–1.0)
Monocytes Relative: 8.3 % (ref 3.0–12.0)
Neutro Abs: 4.8 10*3/uL (ref 1.4–7.7)
Neutrophils Relative %: 71.2 % (ref 43.0–77.0)
Platelets: 180 10*3/uL (ref 150.0–400.0)
RBC: 4.51 Mil/uL (ref 4.22–5.81)
RDW: 14.5 % (ref 11.5–15.5)
WBC: 6.7 10*3/uL (ref 4.0–10.5)

## 2019-07-11 LAB — LIPID PANEL
Cholesterol: 137 mg/dL (ref 0–200)
HDL: 41.6 mg/dL (ref >39.00)
LDL Cholesterol: 68 mg/dL (ref 0–99)
NonHDL: 95.38
Total CHOL/HDL Ratio: 3
Triglycerides: 137 mg/dL (ref 0.0–149.0)
VLDL: 27.4 mg/dL (ref 0.0–40.0)

## 2019-07-11 LAB — HEMOGLOBIN A1C: Hgb A1c MFr Bld: 5.8 % (ref 4.6–6.5)

## 2019-07-11 LAB — PSA: PSA: 0.84 ng/mL (ref 0.10–4.00)

## 2019-07-11 LAB — VITAMIN B12: Vitamin B-12: 469 pg/mL (ref 211–911)

## 2019-07-12 ENCOUNTER — Other Ambulatory Visit: Payer: Self-pay

## 2019-07-12 DIAGNOSIS — E039 Hypothyroidism, unspecified: Secondary | ICD-10-CM

## 2019-07-12 MED ORDER — LEVOTHYROXINE SODIUM 88 MCG PO TABS
88.0000 ug | ORAL_TABLET | Freq: Every day | ORAL | 3 refills | Status: DC
Start: 2019-07-12 — End: 2020-03-29

## 2019-07-18 ENCOUNTER — Other Ambulatory Visit: Payer: Self-pay | Admitting: Family Medicine

## 2019-07-22 ENCOUNTER — Other Ambulatory Visit: Payer: Self-pay | Admitting: Family Medicine

## 2019-08-04 MED FILL — oxyCODONE HCL 5 MG/5ML SOLN: 5 | 30 days supply | Qty: 2500 | Fill #0

## 2019-08-04 MED FILL — fentaNYL 50 MCG/HR PT72: 50 | 30 days supply | Qty: 10 | Fill #0

## 2019-08-17 ENCOUNTER — Telehealth: Payer: Self-pay | Admitting: Family Medicine

## 2019-08-17 NOTE — Telephone Encounter (Signed)
Patients wife called in and wanted to see if the patients last lab results could be mailed to them for there records.

## 2019-08-17 NOTE — Telephone Encounter (Signed)
Labs printed and mailed to pt.  °

## 2019-08-21 ENCOUNTER — Other Ambulatory Visit: Payer: Self-pay

## 2019-09-02 MED FILL — fentaNYL 50 MCG/HR PT72: 50 | 30 days supply | Qty: 10 | Fill #0

## 2019-09-02 MED FILL — oxyCODONE HCL 5 MG/5ML SOLN: 5 | 30 days supply | Qty: 2500 | Fill #0

## 2019-09-12 DIAGNOSIS — R69 Illness, unspecified: Secondary | ICD-10-CM | POA: Diagnosis not present

## 2019-09-12 DIAGNOSIS — R52 Pain, unspecified: Secondary | ICD-10-CM | POA: Diagnosis not present

## 2019-09-12 DIAGNOSIS — C099 Malignant neoplasm of tonsil, unspecified: Secondary | ICD-10-CM | POA: Diagnosis not present

## 2019-09-25 DIAGNOSIS — C44311 Basal cell carcinoma of skin of nose: Secondary | ICD-10-CM | POA: Diagnosis not present

## 2019-10-03 MED FILL — oxyCODONE HCL 5 MG/5ML SOLN: 5 | 30 days supply | Qty: 2500 | Fill #0

## 2019-10-03 MED FILL — fentaNYL 50 MCG/HR PT72: 50 | 30 days supply | Qty: 10 | Fill #0

## 2019-10-24 ENCOUNTER — Telehealth: Payer: Self-pay | Admitting: Family Medicine

## 2019-10-24 NOTE — Progress Notes (Signed)
°  Chronic Care Management   Outreach Note  10/24/2019 Name: Kevin Mcclain MRN: [id number] DOB: 1945/08/01  Referred by: Marin Olp, MD Reason for referral : No chief complaint on file.   An unsuccessful telephone outreach was attempted today. The patient was referred to the pharmacist for assistance with care management and care coordination.   Follow Up Plan:   Earney Hamburg Upstream Scheduler

## 2019-11-01 ENCOUNTER — Telehealth: Payer: Self-pay | Admitting: Family Medicine

## 2019-11-01 NOTE — Progress Notes (Signed)
°  Chronic Care Management   Outreach Note  11/01/2019 Name: Kevin Mcclain MRN: [id number] DOB: [birth year]  Referred by: Marin Olp, MD Reason for referral : No chief complaint on file.   An unsuccessful telephone outreach was attempted today. The patient was referred to the pharmacist for assistance with care management and care coordination.   Follow Up Plan:   Earney Hamburg Upstream Scheduler

## 2019-11-03 MED FILL — fentaNYL 50 MCG/HR PT72: 50 | 30 days supply | Qty: 10 | Fill #0

## 2019-11-03 MED FILL — oxyCODONE HCL 5 MG/5ML SOLN: 5 | 30 days supply | Qty: 2500 | Fill #0

## 2019-11-09 ENCOUNTER — Telehealth: Payer: Self-pay | Admitting: Family Medicine

## 2019-11-09 NOTE — Progress Notes (Signed)
  Chronic Care Management   Note  11/09/2019 Name: Kevin Mcclain MRN: [id number] DOB: [birth year]  Kevin Mcclain is a 74 y.o. year old male who is a primary care patient of Yong Channel, Brayton Mars, MD. I reached out to [facility] by phone today in response to a referral sent by Mr. Jonte Cartwright's PCP, Marin Olp, MD.   [name] was given information about Chronic Care Management services today including:  1. CCM service includes personalized support from designated clinical staff supervised by his physician, including individualized plan of care and coordination with other care providers 2. 24/7 contact phone numbers for assistance for urgent and routine care needs. 3. Service will only be billed when office clinical staff spend 20 minutes or more in a month to coordinate care. 4. Only one practitioner may furnish and bill the service in a calendar month. 5. The patient may stop CCM services at any time (effective at the end of the month) by phone call to the office staff.   Patient wishes to consider information provided and/or speak with a member of the care team before deciding about enrollment in care management services.   Follow up plan:   Earney Hamburg Upstream Scheduler

## 2019-12-04 MED FILL — oxyCODONE HCL 5 MG/5ML SOLN: 5 | 30 days supply | Qty: 2500 | Fill #0

## 2019-12-04 MED FILL — fentaNYL 50 MCG/HR PT72: 50 | 30 days supply | Qty: 10 | Fill #0

## 2019-12-08 ENCOUNTER — Other Ambulatory Visit: Payer: Self-pay | Admitting: Family Medicine

## 2019-12-11 ENCOUNTER — Other Ambulatory Visit (HOSPITAL_COMMUNITY): Payer: Self-pay | Admitting: Neurosurgery

## 2019-12-11 DIAGNOSIS — F1120 Opioid dependence, uncomplicated: Secondary | ICD-10-CM | POA: Diagnosis not present

## 2019-12-11 DIAGNOSIS — R69 Illness, unspecified: Secondary | ICD-10-CM | POA: Diagnosis not present

## 2019-12-11 DIAGNOSIS — C099 Malignant neoplasm of tonsil, unspecified: Secondary | ICD-10-CM | POA: Diagnosis not present

## 2019-12-11 DIAGNOSIS — R52 Pain, unspecified: Secondary | ICD-10-CM | POA: Diagnosis not present

## 2019-12-16 ENCOUNTER — Other Ambulatory Visit: Payer: Self-pay | Admitting: Family Medicine

## 2019-12-19 ENCOUNTER — Telehealth: Payer: Self-pay | Admitting: *Deleted

## 2019-12-19 NOTE — Telephone Encounter (Signed)
Called patient to inquire about moving fu appt. to Tuesday 12-19-19 or Wednesday 12-20-19, patient's wife stated that they couldn't because other appts., patient's wife agreed to come on 01-17-20 @ 11:40 am

## 2019-12-21 NOTE — Progress Notes (Signed)
Mr. Zee presents for follow up of radiation completed on 01/18/2015 to his right tonsil and bilateral neck.   Pain issues, if any: Constant, on-going pain in his throat (even despite taking liquid pain medicine and fentanyl patch). Reports neck often swells and makes it difficult swollow.  Using a feeding tube?: N/A Weight changes, if any:  Wt Readings from Last 3 Encounters:  12/22/19 135 lb 6 oz (61.4 kg)  07/10/19 141 lb (64 kg)  07/10/19 141 lb (64 kg)   Swallowing issues, if any: Yes--is easily choked up when eating. When asked if there are certain foods he can tolerate his wife responded that "it's a day to day thing, and just depends on how his throat is feeling that day" Smoking or chewing tobacco? None Using fluoride trays daily? Yes--mixes flouride gel in with his toothpaste Last ENT visit was on: 09/25/2019 Saw Dr. 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 He feels like there may be something starting up again. On examination, the scar is well-healed. There is a 3 mm nodular slightly pigmented lesion along the left side lateral nasal dorsum. Otherwise the nose looks healthy internally as well. Oral cavity and pharynx without any new lesions. No palpable adenopathy. Recommend we watch this area. It may simply be a seborrheic keratosis which she has had in other places. Recheck in 2 months or sooner if it seems to get larger. If it changes then we will recommend biopsy and excision."   Last H&N exam by Dr. Constance Holster was on 06/05/2019: "Also 4-1/2 years post treatment for tonsil cancer. No new complaints. No palpable neck masses. Diffuse fibrosis of the neck is present. Oral cavity and pharynx is clear except for chronic radiation changes."  Other notable issues, if any: Nothing new of note--just ongoing concerns since completing treatment.  Vitals:   12/22/19 1115  BP: (!) 132/51  Pulse: (!) 57  Resp: 18  Temp: (!) 97.1 F (36.2 C)  SpO2:  97%

## 2019-12-22 ENCOUNTER — Other Ambulatory Visit: Payer: Self-pay

## 2019-12-22 ENCOUNTER — Ambulatory Visit
Admission: RE | Admit: 2019-12-22 | Discharge: 2019-12-22 | Disposition: A | Discharge status: Home / Self Care | Payer: Medicare HMO | Source: Ambulatory Visit | Attending: Radiation Oncology | Admitting: Radiation Oncology

## 2019-12-22 ENCOUNTER — Ambulatory Visit: Payer: Medicare HMO | Admitting: Radiation Oncology

## 2019-12-22 VITALS — BP 132/51 | HR 57 | Temp 97.1°F | Resp 18 | Ht 62.0 in | Wt 135.4 lb

## 2019-12-22 DIAGNOSIS — Z85819 Personal history of malignant neoplasm of unspecified site of lip, oral cavity, and pharynx: Secondary | ICD-10-CM | POA: Diagnosis not present

## 2019-12-22 DIAGNOSIS — Z85818 Personal history of malignant neoplasm of other sites of lip, oral cavity, and pharynx: Secondary | ICD-10-CM | POA: Insufficient documentation

## 2019-12-22 DIAGNOSIS — Z923 Personal history of irradiation: Secondary | ICD-10-CM | POA: Diagnosis not present

## 2019-12-22 DIAGNOSIS — I890 Lymphedema, not elsewhere classified: Secondary | ICD-10-CM | POA: Diagnosis not present

## 2019-12-22 DIAGNOSIS — C090 Malignant neoplasm of tonsillar fossa: Secondary | ICD-10-CM

## 2019-12-22 MED ORDER — LARYNGOSCOPY SOLUTION RAD-ONC
15.0000 mL | Freq: Once | TOPICAL | Status: AC
  Administered 2019-12-22: 15 mL via TOPICAL
  Filled 2019-12-22: qty 15

## 2019-12-22 NOTE — Progress Notes (Signed)
Radiation Oncology         [phone] ________________________________  Name: Kevin Mcclain MRN: [id number]  Date: 12/22/2019  DOB: 12/14/1945  Follow-up Visit Note  CC: Marin Olp, MD  Marin Olp, MD  Diagnosis and Prior Radiotherapy:       ICD-10-CM   1. Cancer of tonsillar fossa (HCC)  C09.0 laryngocopy solution for Rad-Onc    Fiberoptic laryngoscopy  2. 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   Pain issues, if any: Constant, on-going pain in his throat (even despite taking liquid pain medicine and fentanyl patch). Reports neck often swells and makes it difficult swollow.   Using a feeding tube?: N/A Weight changes, if any:  Wt Readings from Last 3 Encounters:  12/22/19 135 lb 6 oz (61.4 kg)  07/10/19 141 lb (64 kg)  07/10/19 141 lb (64 kg)   Swallowing issues, if any: Yes--is easily choked up when eating. When asked if there are certain foods he can tolerate his wife responded that "it's a day to day thing, and just depends on how his throat is feeling that day" Smoking or chewing tobacco? None Using fluoride trays daily? Yes--mixes flouride gel in with his toothpaste Last ENT visit was on: 09/25/2019 Saw Dr. 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 He feels like there may be something starting up again. On examination, the scar is well-healed. There is a 3 mm  nodular slightly pigmented lesion along the left side lateral nasal dorsum. Otherwise the nose looks healthy internally as well. Oral cavity and pharynx without any new lesions. No palpable adenopathy. Recommend we watch this area. It may simply be a seborrheic keratosis which she has had in other places. Recheck in 2 months or sooner if it seems to get larger. If it changes then we will recommend biopsy and excision."   Last H&N exam by Dr. Constance Holster was on 06/05/2019: "Also 4-1/2 years post treatment for tonsil cancer. No new complaints. No palpable neck masses. Diffuse fibrosis of the neck is present. Oral cavity and pharynx is clear except for chronic radiation changes."     ALLERGIES:  is allergic to benzonatate, codeine, and sitagliptin phosphate.  Meds: Current Outpatient Medications  Medication Sig Dispense Refill  . aspirin 81 MG tablet Take 81 mg by mouth daily. Does not take everyday    . atorvastatin (LIPITOR) 10 MG tablet TAKE 1 TABLET BY MOUTH EVERY DAY 90 tablet 3  . colchicine 0.6 MG tablet Take 2 tablets at first sign of gout flare and may repeat in 2 hours a single tablet then once daily until symptoms resolve 30 tablet 2  . fentaNYL (DURAGESIC - DOSED MCG/HR) 50 MCG/HR Place 1 patch (50 mcg total) onto the skin every 3 (three) days. 10 patch 0  . levothyroxine (SYNTHROID) 88 MCG tablet Take 1 tablet (88 mcg total) by mouth daily.  90 tablet 3  . metFORMIN (GLUCOPHAGE) 500 MG tablet TAKE 1 TABLET BY MOUTH DAILY WITH BREAKFAST AND TAKE 1 TABLET DAILY WITH DINNER 180 tablet 1  . metoprolol succinate (TOPROL-XL) 25 MG 24 hr tablet TAKE 1 TABLET (25 MG TOTAL) BY MOUTH DAILY. TAKE WITH OR IMMEDIATELY FOLLOWING A MEAL. 90 tablet 3  . OVER THE COUNTER MEDICATION Place 1 drop into both eyes 3 (three) times daily as needed (dry eyes/ irritation). Clear Cooling Eye Drops    . oxyCODONE (ROXICODONE) 5 MG/5ML solution Take 10-15 mg by mouth every 4 (four) hours as needed.    . prochlorperazine  (COMPAZINE) 10 MG tablet Take 1 tablet (10 mg total) by mouth every 6 (six) hours as needed for nausea or vomiting. 30 tablet 5  . ramipril (ALTACE) 5 MG capsule Take 2 pills in the morning 180 capsule 3  . sertraline (ZOLOFT) 100 MG tablet TAKE 1 TABLET (100 MG TOTAL) BY MOUTH DAILY. PATIENT MUST KEEP HIS APPT TO GET MORE REFILLS. 90 tablet 1  . sodium fluoride (PREVIDENT 5000 PLUS) 1.1 % CREA dental cream Apply to tooth brush. Brush teeth for 2 minutes. Spit out excess-DO NOT swallow. Repeat nightly. 1 Tube prn  . vitamin E 400 UNIT capsule Take 400 IU daily x 1 week, then 400 IU BID 60 capsule 5  . nitroGLYCERIN (NITROSTAT) 0.4 MG SL tablet Place 1 tablet (0.4 mg total) under the tongue every 5 (five) minutes as needed. 50 tablet 3   No current facility-administered medications for this encounter.    Physical Findings: Wt Readings from Last 3 Encounters:  12/22/19 135 lb 6 oz (61.4 kg)  07/10/19 141 lb (64 kg)  07/10/19 141 lb (64 kg)    height is 5\' 2"  (1.575 m) and weight is 135 lb 6 oz (61.4 kg). His temporal temperature is 97.1 F (36.2 C) (abnormal). His blood pressure is 132/51 (abnormal) and his pulse is 57 (abnormal). His respiration is 18 and oxygen saturation is 97%.  General: Alert and oriented, in no acute distress. HEENT: Head is normocephalic.  Oral cavity and oropharynx are clear.  Neck: No palpable masses in the cervical or supraclavicular regions.  Stable fibrosis. Anterior modest lymphedema. Heart: Regular in rate and rhythm with no murmurs, rubs, or gallops. Chest: Clear to auscultation bilaterally, with no rhonchi, wheezes, or rales.  PROCEDURE NOTE: After obtaining consent and anesthetizing the nasal cavity with topical lidocaine and phenylephrine, the flexible endoscope was introduced and passed through the nasal cavity.  No lesions identified in the nasopharynx, base of tongue, vallecula, hypopharynx, supraglottis, or glottis.  The true cords are symmetrically mobile  without any nodularity.  Lab Findings: Lab Results  Component Value Date   WBC 6.7 07/10/2019   HGB 14.0 07/10/2019   HCT 40.4 07/10/2019   MCV 89.7 07/10/2019   PLT 180.0 07/10/2019    Lab Results  Component Value Date   TSH 9.91 (H) 07/10/2019    Radiographic Findings: No results found.  Impression/Plan:    1) Head and Neck Cancer Status: No evidence of disease on exam.   He is doing well navigating his new normal after his cancer treatment completed 5 years ago. Anderson Malta Malmfelt will send a message to Sandi Mealy PA to schedule Kevin Mcclain for a 6 month follow up with him for Head and Neck survivorship.   I will see him back on an as-needed basis.  He and his wife are pleased with this plan.  He will continue  to follow with Dr. Constance Holster for a basal cell carcinoma on the nose.  He does continue to have some lymphedema in anterior neck which he is coped with for years.  Encouraged continued massage at home.  2) Nutritional Status: Weight decreased, push po as tolerated.  He and his wife are well-informed on nutritional strategies  Wt Readings from Last 3 Encounters:  12/22/19 135 lb 6 oz (61.4 kg)  07/10/19 141 lb (64 kg)  07/10/19 141 lb (64 kg)    3) Swallowing: Patient continues to have stable dysphagia and odynophagia. He continues to see a subspecialist (a new physician who has replaced Dr. Maryjean Ka ) for his pain management.   4) Dental: Encouraged patient to see dentistry for regular followup with dentistry, and dental hygiene including fluoride rinses.  He hesitates to see his dentist due to concerns about Covid and does not feel comfortable getting vaccinated.  Therefore, encourage the patient to maximize his oral hygiene at home and see a dentist as soon as he feels comfortable.  5) Thyroid function: He is taking levothyroxine under the care of Dr. Yong Channel and will continue lab work at their discretion.   Lab Results  Component Value Date   TSH 9.91 (H)  07/10/2019    6)  We discussed measures to reduce the risk of infection during the COVID-19 pandemic.  I listened to the patient and his wife expressed their concerns about the vaccine.  I talked about the relative small risks of the vaccine and the significant risks of the Covid virus.  I strongly recommended that they receive the vaccine.  I offered to arrange for the patient himself to get the [place] vaccine today in clinic if he wishes.  They are quite adamant that they do not want to receive the vaccine.  We talked about masking and social distancing and avoidance of gathering with people outside their home indoors; they know to contact me if they change their mind about the vaccine.    I wish the patient and his wife the very best.  It has been an honor getting to know them over the past several years.  They know to call if they have any questions or concerns that I can address in the future and look forward to meeting Sandi Mealy of survivorship for an extra layer of ongoing support given that he has some chronic symptoms following curative therapy.  On date of service, in total, I spent 30 minutes on this encounter. Patient was seen in person.  _____________________________________   Eppie Gibson, MD

## 2019-12-22 NOTE — Progress Notes (Signed)
Oncology Nurse Navigator Documentation  I met with [name] and his wife during their follow up appointment with Dr. Isidore Moos today. He is doing well navigating his new normal after his cancer treatment completed 5 years ago. I have sent a message to Kevin Mealy PA to schedule [name] for a 6 month follow up with him for Head and Neck survivorship.   Harlow Asa RN, BSN, OCN Head & Neck Oncology Nurse [place] at [facility] Phone # [phone]  Fax # [phone]

## 2019-12-26 ENCOUNTER — Encounter: Payer: Self-pay | Admitting: Radiation Oncology

## 2020-01-03 MED FILL — fentaNYL 50 MCG/HR PT72: 50 | 30 days supply | Qty: 10 | Fill #0

## 2020-01-03 MED FILL — oxyCODONE HCL 5 MG/5ML SOLN: 5 | 30 days supply | Qty: 2500 | Fill #0

## 2020-01-10 NOTE — Progress Notes (Deleted)
Phone [phone] In person visit   Subjective:   Kevin Mcclain is a 74 y.o. year old very pleasant male patient who presents for/with See problem oriented charting No chief complaint on file.   This visit occurred during the SARS-CoV-2 public health emergency.  Safety protocols were in place, including screening questions prior to the visit, additional usage of staff PPE, and extensive cleaning of exam room while observing appropriate contact time as indicated for disinfecting solutions.   Past Medical History-  Patient Active Problem List   Diagnosis Date Noted  . Drug-induced polyneuropathy ([place]) 07/10/2019  . Atherosclerosis of aorta ([place]) 07/10/2019  . Senile purpura ([place]) 03/17/2017  . B12 deficiency 01/15/2017  . BPH associated with nocturia 10/30/2016  . Hypothyroidism 09/11/2016  . Depression, major, single episode, moderate ([place]) 04/02/2016  . Ascending aortic aneurysm ([place]) 03/25/2016  . Diabetic neuropathy ([place]) 12/26/2015  . Herpes zoster 07/15/2015  . GERD (gastroesophageal reflux disease) 06/10/2015  . 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 Arthropathy 03/10/2013  . Low back pain 11/26/2011  . Kidney stone 11/04/2011  . Osteoarthritis 11/18/2010  . CAD (coronary artery disease) 10/11/2008  . Gout 03/28/2007  . Type 2 diabetes mellitus with peripheral neuropathy ([place]) 01/24/2007  . HYPERLIPIDEMIA 01/24/2007  . Essential hypertension 01/24/2007    Medications- reviewed and updated Current Outpatient Medications  Medication Sig Dispense Refill  . aspirin 81 MG tablet Take 81 mg by mouth daily. Does not take everyday    . atorvastatin (LIPITOR) 10 MG tablet TAKE 1 TABLET BY MOUTH EVERY DAY 90 tablet 3  . colchicine 0.6 MG tablet Take 2 tablets at first sign of gout flare and may repeat in 2 hours a single tablet then once daily until symptoms resolve 30 tablet 2  . fentaNYL (DURAGESIC - DOSED MCG/HR) 50 MCG/HR Place 1  patch (50 mcg total) onto the skin every 3 (three) days. 10 patch 0  . levothyroxine (SYNTHROID) 88 MCG tablet Take 1 tablet (88 mcg total) by mouth daily. 90 tablet 3  . metFORMIN (GLUCOPHAGE) 500 MG tablet TAKE 1 TABLET BY MOUTH DAILY WITH BREAKFAST AND TAKE 1 TABLET DAILY WITH DINNER 180 tablet 1  . metoprolol succinate (TOPROL-XL) 25 MG 24 hr tablet TAKE 1 TABLET (25 MG TOTAL) BY MOUTH DAILY. TAKE WITH OR IMMEDIATELY FOLLOWING A MEAL. 90 tablet 3  . nitroGLYCERIN (NITROSTAT) 0.4 MG SL tablet Place 1 tablet (0.4 mg total) under the tongue every 5 (five) minutes as needed. 50 tablet 3  . OVER THE COUNTER MEDICATION Place 1 drop into both eyes 3 (three) times daily as needed (dry eyes/ irritation). Clear Cooling Eye Drops    . oxyCODONE (ROXICODONE) 5 MG/5ML solution Take 10-15 mg by mouth every 4 (four) hours as needed.    . prochlorperazine (COMPAZINE) 10 MG tablet Take 1 tablet (10 mg total) by mouth every 6 (six) hours as needed for nausea or vomiting. 30 tablet 5  . ramipril (ALTACE) 5 MG capsule Take 2 pills in the morning 180 capsule 3  . sertraline (ZOLOFT) 100 MG tablet TAKE 1 TABLET (100 MG TOTAL) BY MOUTH DAILY. PATIENT MUST KEEP HIS APPT TO GET MORE REFILLS. 90 tablet 1  . sodium fluoride (PREVIDENT 5000 PLUS) 1.1 % CREA dental cream Apply to tooth brush. Brush teeth for 2 minutes. Spit out excess-DO NOT swallow. Repeat nightly. 1 Tube prn  . vitamin E 400 UNIT capsule Take 400 IU daily x 1 week, then 400 IU  BID 60 capsule 5   No current facility-administered medications for this visit.     Objective:  There were no vitals taken for this visit. Gen: NAD, resting comfortably CV: RRR no murmurs rubs or gallops Lungs: CTAB no crackles, wheeze, rhonchi Abdomen: soft/nontender/nondistended/normal bowel sounds. No rebound or guarding.  Ext: no edema Skin: warm, dry Neuro: grossly normal, moves all extremities  ***    Assessment and Plan  *** No specialty comments  available.  No problem-specific Assessment & Plan notes found for this encounter.   Recommended follow up: ***No follow-ups on file. Future Appointments  Date Time Provider [place]  01/11/2020 11:20 AM Marin Olp, MD [facility]    Lab/Order associations: No diagnosis found.  No orders of the defined types were placed in this encounter.   Time Spent: *** minutes of total time (4:15 PM***- 4:15 PM***) was spent on the date of the encounter performing the following actions: chart review prior to seeing the patient, obtaining history, performing a medically necessary exam, counseling on the treatment plan, placing orders, and documenting in our EHR.   Return precautions advised.  Thomes Cake, [place]

## 2020-01-11 ENCOUNTER — Ambulatory Visit: Payer: Medicare HMO | Admitting: Family Medicine

## 2020-01-13 ENCOUNTER — Other Ambulatory Visit: Payer: Self-pay | Admitting: Family Medicine

## 2020-01-17 ENCOUNTER — Ambulatory Visit: Payer: Medicare HMO | Admitting: Radiation Oncology

## 2020-01-29 ENCOUNTER — Telehealth: Payer: Self-pay | Admitting: Medical

## 2020-01-29 NOTE — Telephone Encounter (Signed)
Scheduled appt per 11/26 sch msg - mailed reminder letter with appt date and time

## 2020-02-02 MED FILL — oxyCODONE HCL 5 MG/5ML SOLN: 5 | 30 days supply | Qty: 2500 | Fill #0

## 2020-02-02 MED FILL — fentaNYL 50 MCG/HR PT72: 50 | 30 days supply | Qty: 10 | Fill #0

## 2020-02-13 ENCOUNTER — Other Ambulatory Visit: Payer: Self-pay | Admitting: Family Medicine

## 2020-02-14 ENCOUNTER — Other Ambulatory Visit (HOSPITAL_COMMUNITY): Payer: Self-pay | Admitting: Neurosurgery

## 2020-02-14 DIAGNOSIS — C099 Malignant neoplasm of tonsil, unspecified: Secondary | ICD-10-CM | POA: Diagnosis not present

## 2020-02-14 DIAGNOSIS — R52 Pain, unspecified: Secondary | ICD-10-CM | POA: Diagnosis not present

## 2020-02-14 DIAGNOSIS — C760 Malignant neoplasm of head, face and neck: Secondary | ICD-10-CM | POA: Diagnosis not present

## 2020-02-14 DIAGNOSIS — R69 Illness, unspecified: Secondary | ICD-10-CM | POA: Diagnosis not present

## 2020-02-28 ENCOUNTER — Other Ambulatory Visit (HOSPITAL_COMMUNITY): Payer: Self-pay | Admitting: Neurosurgery

## 2020-03-01 MED FILL — oxyCODONE HCL 5 MG/5ML SOLN: 5 | 30 days supply | Qty: 2500 | Fill #0

## 2020-03-01 MED FILL — fentaNYL 50 MCG/HR PT72: 50 | 30 days supply | Qty: 10 | Fill #0

## 2020-03-28 ENCOUNTER — Encounter: Payer: Self-pay | Admitting: Family Medicine

## 2020-03-28 ENCOUNTER — Ambulatory Visit (INDEPENDENT_AMBULATORY_CARE_PROVIDER_SITE_OTHER): Payer: Medicare HMO | Admitting: Family Medicine

## 2020-03-28 ENCOUNTER — Other Ambulatory Visit: Payer: Self-pay

## 2020-03-28 VITALS — BP 130/68 | HR 61 | Temp 98.2°F | Ht 62.0 in | Wt 133.4 lb

## 2020-03-28 DIAGNOSIS — I700 Atherosclerosis of aorta: Secondary | ICD-10-CM

## 2020-03-28 DIAGNOSIS — E039 Hypothyroidism, unspecified: Secondary | ICD-10-CM | POA: Diagnosis not present

## 2020-03-28 DIAGNOSIS — E1142 Type 2 diabetes mellitus with diabetic polyneuropathy: Secondary | ICD-10-CM | POA: Diagnosis not present

## 2020-03-28 DIAGNOSIS — G620 Drug-induced polyneuropathy: Secondary | ICD-10-CM | POA: Diagnosis not present

## 2020-03-28 DIAGNOSIS — D692 Other nonthrombocytopenic purpura: Secondary | ICD-10-CM | POA: Diagnosis not present

## 2020-03-28 DIAGNOSIS — M1000 Idiopathic gout, unspecified site: Secondary | ICD-10-CM

## 2020-03-28 DIAGNOSIS — E782 Mixed hyperlipidemia: Secondary | ICD-10-CM

## 2020-03-28 DIAGNOSIS — R112 Nausea with vomiting, unspecified: Secondary | ICD-10-CM

## 2020-03-28 DIAGNOSIS — F325 Major depressive disorder, single episode, in full remission: Secondary | ICD-10-CM

## 2020-03-28 DIAGNOSIS — I2510 Atherosclerotic heart disease of native coronary artery without angina pectoris: Secondary | ICD-10-CM

## 2020-03-28 DIAGNOSIS — Z79899 Other long term (current) drug therapy: Secondary | ICD-10-CM | POA: Diagnosis not present

## 2020-03-28 DIAGNOSIS — L989 Disorder of the skin and subcutaneous tissue, unspecified: Secondary | ICD-10-CM

## 2020-03-28 DIAGNOSIS — I10 Essential (primary) hypertension: Secondary | ICD-10-CM

## 2020-03-28 DIAGNOSIS — R69 Illness, unspecified: Secondary | ICD-10-CM | POA: Diagnosis not present

## 2020-03-28 LAB — VITAMIN B12: Vitamin B-12: 1202 pg/mL — ABNORMAL HIGH (ref 211–911)

## 2020-03-28 LAB — CBC WITH DIFFERENTIAL/PLATELET
Basophils Absolute: 0.1 10*3/uL (ref 0.0–0.1)
Basophils Relative: 1 % (ref 0.0–3.0)
Eosinophils Absolute: 0.2 10*3/uL (ref 0.0–0.7)
Eosinophils Relative: 3.1 % (ref 0.0–5.0)
HCT: 41.7 % (ref 39.0–52.0)
Hemoglobin: 14.4 g/dL (ref 13.0–17.0)
Lymphocytes Relative: 14.7 % (ref 12.0–46.0)
Lymphs Abs: 1.1 10*3/uL (ref 0.7–4.0)
MCHC: 34.6 g/dL (ref 30.0–36.0)
MCV: 87.6 fl (ref 78.0–100.0)
Monocytes Absolute: 0.6 10*3/uL (ref 0.1–1.0)
Monocytes Relative: 7.5 % (ref 3.0–12.0)
Neutro Abs: 5.5 10*3/uL (ref 1.4–7.7)
Neutrophils Relative %: 73.7 % (ref 43.0–77.0)
Platelets: 220 10*3/uL (ref 150.0–400.0)
RBC: 4.76 Mil/uL (ref 4.22–5.81)
RDW: 13.7 % (ref 11.5–15.5)
WBC: 7.5 10*3/uL (ref 4.0–10.5)

## 2020-03-28 LAB — COMPREHENSIVE METABOLIC PANEL
ALT: 34 U/L (ref 0–53)
AST: 27 U/L (ref 0–37)
Albumin: 4.3 g/dL (ref 3.5–5.2)
Alkaline Phosphatase: 54 U/L (ref 39–117)
BUN: 18 mg/dL (ref 6–23)
CO2: 29 mEq/L (ref 19–32)
Calcium: 9.7 mg/dL (ref 8.4–10.5)
Chloride: 105 mEq/L (ref 96–112)
Creatinine, Ser: 1.07 mg/dL (ref 0.40–1.50)
GFR: 67.35 mL/min (ref >60.00)
Glucose, Bld: 106 mg/dL — ABNORMAL HIGH (ref 70–99)
Potassium: 4.4 mEq/L (ref 3.5–5.1)
Sodium: 139 mEq/L (ref 135–145)
Total Bilirubin: 0.9 mg/dL (ref 0.2–1.2)
Total Protein: 6.8 g/dL (ref 6.0–8.3)

## 2020-03-28 LAB — TSH: TSH: 0.18 u[IU]/mL — ABNORMAL LOW (ref 0.35–4.50)

## 2020-03-28 LAB — HEMOGLOBIN A1C: Hgb A1c MFr Bld: 5.6 % (ref 4.6–6.5)

## 2020-03-28 LAB — URIC ACID: Uric Acid, Serum: 7 mg/dL (ref 4.0–7.8)

## 2020-03-28 MED ORDER — AMOXICILLIN-POT CLAVULANATE 875-125 MG PO TABS: 1.0000 | ORAL_TABLET | Freq: Two times a day (BID) | ORAL | 0 refills | Status: AC

## 2020-03-28 MED ORDER — NORTRIPTYLINE HCL 10 MG PO CAPS: 10.0000 mg | ORAL_CAPSULE | Freq: Every day | ORAL | 5 refills | Status: DC

## 2020-03-28 MED ORDER — COLCHICINE 0.6 MG PO TABS
ORAL_TABLET | ORAL | 5 refills | Status: DC
Start: 2020-03-28 — End: 2020-10-21

## 2020-03-28 MED ORDER — PROCHLORPERAZINE MALEATE 10 MG PO TABS: 10.0000 mg | ORAL_TABLET | Freq: Four times a day (QID) | ORAL | 5 refills | Status: DC | PRN

## 2020-03-28 MED ORDER — SERTRALINE HCL 100 MG PO TABS
100.0000 mg | ORAL_TABLET | Freq: Every day | ORAL | 3 refills | Status: DC
Start: 2020-03-28 — End: 2021-05-19

## 2020-03-28 NOTE — Patient Instructions (Addendum)
Please stop by lab before you go If you have mychart- we will send your results within 3 business days of [place] receiving them.  If you do not have mychart- we will call you about results within 5 business days of [place] receiving them.  *please also note that you will see labs on mychart as soon as they post. I will later go in and write notes on them- will say "notes from Dr. Yong Channel"  Trial nortriptyline- if you feel off at all on this certainly stop.   If no improvement in sinus symptoms within 10 days (7 days antibiotics) would be reasonable to see ENT with your history.

## 2020-03-28 NOTE — Progress Notes (Signed)
Phone [phone] In person visit   Subjective:   Kevin Mcclain is a 75 y.o. year old very pleasant male patient who presents for/with See problem oriented charting Chief Complaint  Patient presents with  . Follow-up    Blood work also.  A1c, B12 and thyroid    This visit occurred during the SARS-CoV-2 public health emergency.  Safety protocols were in place, including screening questions prior to the visit, additional usage of staff PPE, and extensive cleaning of exam room while observing appropriate contact time as indicated for disinfecting solutions.   Past Medical History-  Patient Active Problem List   Diagnosis Date Noted  . Ascending aortic aneurysm ([place]) 03/25/2016    Priority: High  . 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 Osteoarthritis 11/18/2010    Priority: High  . CAD (coronary artery disease) 10/11/2008    Priority: High  . Type 2 diabetes mellitus with peripheral neuropathy ([place]) 01/24/2007    Priority: High  . BPH associated with nocturia 10/30/2016    Priority: Medium  . Hypothyroidism 09/11/2016    Priority: Medium  . Depression, major, single episode, moderate ([place]) 04/02/2016    Priority: Medium  . Diabetic neuropathy ([place]) 12/26/2015    Priority: Medium  . Gout 03/28/2007    Priority: Medium  . HYPERLIPIDEMIA 01/24/2007    Priority: Medium  . Essential hypertension 01/24/2007    Priority: Medium  . Senile purpura ([place]) 03/17/2017    Priority: Low  . B12 deficiency 01/15/2017    Priority: Low  . Herpes zoster 07/15/2015    Priority: Low  . GERD (gastroesophageal reflux disease) 06/10/2015    Priority: Low  . Arthropathy 03/10/2013    Priority: Low  . Low back pain 11/26/2011    Priority: Low  . Kidney stone 11/04/2011    Priority: Low  . Drug-induced polyneuropathy ([place]) 07/10/2019  . Atherosclerosis of aorta ([place]) 07/10/2019    Medications- reviewed and updated Current  Outpatient Medications  Medication Sig Dispense Refill  . amoxicillin-clavulanate (AUGMENTIN) 875-125 MG tablet Take 1 tablet by mouth 2 (two) times daily for 7 days. 14 tablet 0  . Ascorbic Acid (VITAMIN C PO) Take by mouth.    Mar[place] Kitchen aspirin 81 MG tablet Take 81 mg by mouth daily. Does not take everyday    . atorvastatin (LIPITOR) 10 MG tablet TAKE 1 TABLET BY MOUTH EVERY DAY 90 tablet 3  . fentaNYL (DURAGESIC - DOSED MCG/HR) 50 MCG/HR Place 1 patch (50 mcg total) onto the skin every 3 (three) days. 10 patch 0  . levothyroxine (SYNTHROID) 88 MCG tablet Take 1 tablet (88 mcg total) by mouth daily. 90 tablet 3  . metFORMIN (GLUCOPHAGE) 500 MG tablet TAKE 1 TABLET BY MOUTH DAILY WITH BREAKFAST AND TAKE 1 TABLET DAILY WITH DINNER 180 tablet 1  . metoprolol succinate (TOPROL-XL) 25 MG 24 hr tablet TAKE 1 TABLET (25 MG TOTAL) BY MOUTH DAILY. TAKE WITH OR IMMEDIATELY FOLLOWING A MEAL. 90 tablet 3  . nitroGLYCERIN (NITROSTAT) 0.4 MG SL tablet Place 1 tablet (0.4 mg total) under the tongue every 5 (five) minutes as needed. 50 tablet 3  . nortriptyline (PAMELOR) 10 MG capsule Take 1 capsule (10 mg total) by mouth at bedtime. 30 capsule 5  . OVER THE COUNTER MEDICATION Place 1 drop into both eyes 3 (three) times daily as needed (dry eyes/ irritation). Clear Cooling Eye Drops    . oxyCODONE (ROXICODONE) 5 MG/5ML solution Take  10-15 mg by mouth every 4 (four) hours as needed.    . ramipril (ALTACE) 5 MG capsule Take 2 pills in the morning 180 capsule 3  . sodium fluoride (PREVIDENT 5000 PLUS) 1.1 % CREA dental cream Apply to tooth brush. Brush teeth for 2 minutes. Spit out excess-DO NOT swallow. Repeat nightly. 1 Tube prn  . VITAMIN D PO Take by mouth.    . vitamin E 400 UNIT capsule Take 400 IU daily x 1 week, then 400 IU BID 60 capsule 5  . colchicine 0.6 MG tablet Take 2 tablets at first sign of gout flare and may repeat in 2 hours a single tablet then once daily until symptoms resolve 30 tablet 5  .  prochlorperazine (COMPAZINE) 10 MG tablet Take 1 tablet (10 mg total) by mouth every 6 (six) hours as needed for nausea or vomiting. 30 tablet 5  . sertraline (ZOLOFT) 100 MG tablet Take 1 tablet (100 mg total) by mouth daily. 90 tablet 3   No current facility-administered medications for this visit.     Objective:  BP 130/68   Pulse 61   Temp 98.2 F (36.8 C) (Temporal)   Ht 5\' 2"  (1.575 m)   Wt 133 lb 6.4 oz (60.5 kg)   SpO2 97%   BMI 24.40 kg/m  Gen: NAD, resting comfortably CV: RRR no murmurs rubs or gallops Lungs: CTAB no crackles, wheeze, rhonchi Abdomen: soft/nontender/nondistended/normal bowel sounds.  Ext: no edema Skin: warm, dry     Assessment and Plan   #CAD- also on aspirin.  #hyperlipidemia #aortic atherosclerosis S: Medication: atorvastatin 10 mg Lab Results  Component Value Date   CHOL 137 07/10/2019   HDL 41.60 07/10/2019   LDLCALC 68 07/10/2019   LDLDIRECT 100.0 06/29/2016   TRIG 137.0 07/10/2019   CHOLHDL 3 07/10/2019   A/P: HLD  Stable. Continue current medications. LDL goal <70 No CAD symptoms reported - continue aspirin in addition to statin Continue risk factor modification for aortic atherosclerosis  # Sinus issues S:had prior fungal ball in sinuses. 1 month of symptoms with pressure and green mucus . Feels more run down.  A/P: past date that covid testing would be helpful.  Sounds like bacterial sinusitis- will treat with augmentin  # neuropathy - diabetes and chemotherapy both contribute S:pain is worse in daytime- in fingertips- also has numbness. Gabapentin in past caused shakiness.  At least 10/10 pain. Fall on cymbalta in past A/P: didn't tolerate gabapentin. We will trial nortriptyline. Hesitant to start with lyrica. Falls on cymbalta in past.  Could consider these in future. There are some risks with nortriptyline with age and being on sertraline but I think with level of pain its worth a trial- cymbalta conversion may be next  step  #hypothyroidism S: compliant On thyroid medication-levothyroxine 88 mcg adjusted dose  Lab Results  Component Value Date   TSH 9.91 (H) 07/10/2019   A/P:hopefully improved- update levels today  # Diabetes S: Medication: metformin 500mg  BID CBGs- does not check Exercise and diet- limited exercise Lab Results  Component Value Date   HGBA1C 5.8 07/10/2019   HGBA1C 6.1 11/09/2017   HGBA1C 5.3 03/17/2017  A/P: hopefully controlled- update a1c today  #hypertension S: medication: metoprolol 25 mg  XR daily, ramipril 5 mg BP Readings from Last 3 Encounters:  03/28/20 130/68  12/22/19 (!) 132/51  07/10/19 132/74  A/P: Stable. Continue current medications.   # Gout- has to be careful with foods- light flare twice a month colchicine  helps. Interesting that has this despite uric acid level below 5-  Lab Results  Component Value Date   LABURIC 3.5 (L) 10/10/2014   # Depression S: Medication: zoloft 100mg  Depression screen [facility] 2/9 03/28/2020 07/10/2019 07/10/2019  Decreased Interest 0 0 0  Down, Depressed, Hopeless 0 0 0  PHQ - 2 Score 0 0 0  Altered sleeping 0 0 -  Tired, decreased energy 3 3 -  Change in appetite 0 0 -  Feeling bad or failure about yourself  0 0 -  Trouble concentrating 0 0 -  Moving slowly or fidgety/restless 0 0 -  Suicidal thoughts 0 0 -  PHQ-9 Score 3 3 -  Difficult doing work/chores Somewhat difficult Not difficult at all -  Some recent data might be hidden   A/P: full remission- continue current meds. Discussed possible cymbalta with neuropathy but he did not do well on this in past- had falls   #senile purpura- stable easy bruising/bleeding  Recommended follow up: we did not schedule planned follow up but CPE 07/10/20 or later would eb reasonable Future Appointments  Date Time Provider [place]  06/24/2020  1:00 PM Tanner, Lyndon Code., PA-C [facility] None    Lab/Order associations:   ICD-10-CM   1. Type 2 diabetes mellitus with  peripheral neuropathy (HCC)  E11.42 Hemoglobin A1c    CBC with Differential/Platelet    Comprehensive metabolic panel  2. Diabetic polyneuropathy associated with type 2 diabetes mellitus (HCC)  E11.42   3. Hypothyroidism, unspecified type  E03.9 TSH  4. High risk medication use  Z79.899 Vitamin B12  5. Skin lesion  L98.9 Ambulatory referral to Dermatology  6. Nausea and vomiting, intractability of vomiting not specified, unspecified vomiting type  R11.2 prochlorperazine (COMPAZINE) 10 MG tablet  7. Idiopathic gout, unspecified chronicity, unspecified site  M10.00 Uric acid  8. Coronary artery disease involving native coronary artery of native heart without angina pectoris  I25.10   9. HYPERLIPIDEMIA  E78.2   10. Essential hypertension  I10   11. Drug-induced polyneuropathy (HCC) Chronic G62.0   12. Major depressive disorder with single episode, in full remission ([place]) Chronic F32.5   13. Atherosclerosis of aorta (HCC) Chronic I70.0   14. Senile purpura (HCC) Chronic D69.2     Meds ordered this encounter  Medications  . amoxicillin-clavulanate (AUGMENTIN) 875-125 MG tablet    Sig: Take 1 tablet by mouth 2 (two) times daily for 7 days.    Dispense:  14 tablet    Refill:  0  . nortriptyline (PAMELOR) 10 MG capsule    Sig: Take 1 capsule (10 mg total) by mouth at bedtime.    Dispense:  30 capsule    Refill:  5  . sertraline (ZOLOFT) 100 MG tablet    Sig: Take 1 tablet (100 mg total) by mouth daily.    Dispense:  90 tablet    Refill:  3  . colchicine 0.6 MG tablet    Sig: Take 2 tablets at first sign of gout flare and may repeat in 2 hours a single tablet then once daily until symptoms resolve    Dispense:  30 tablet    Refill:  5  . prochlorperazine (COMPAZINE) 10 MG tablet    Sig: Take 1 tablet (10 mg total) by mouth every 6 (six) hours as needed for nausea or vomiting.    Dispense:  30 tablet    Refill:  5    Return precautions advised.  Garret Reddish, MD

## 2020-03-28 NOTE — Assessment & Plan Note (Signed)
S:pain is worse in daytime- in fingertips- also has numbness. Gabapentin in past caused shakiness.  At least 10/10 pain. Fall on cymbalta in past. Fentanyl does not help this pain A/P: didn't tolerate gabapentin. We will trial nortriptyline. Hesitant to start with lyrica. Falls on cymbalta in past.  Could consider these in future. There are some risks with nortriptyline with age and being on sertraline but I think with level of pain its worth a trial- cymbalta conversion may be next step

## 2020-03-29 ENCOUNTER — Other Ambulatory Visit: Payer: Self-pay

## 2020-03-29 DIAGNOSIS — E039 Hypothyroidism, unspecified: Secondary | ICD-10-CM

## 2020-03-29 MED ORDER — ALLOPURINOL 100 MG PO TABS: 100.0000 mg | ORAL_TABLET | Freq: Every day | ORAL | 3 refills | Status: DC

## 2020-03-29 MED ORDER — METFORMIN HCL 500 MG PO TABS: 500.0000 mg | ORAL_TABLET | Freq: Every day | ORAL | 1 refills | Status: DC

## 2020-03-29 MED ORDER — LEVOTHYROXINE SODIUM 88 MCG PO TABS: ORAL_TABLET | ORAL | 3 refills | Status: DC

## 2020-03-29 MED FILL — ALLOPURINOL 100 MG TABS: 100 | 90 days supply | Qty: 90 | Fill #0

## 2020-03-29 MED FILL — LEVOTHYROXINE 88 MCG TABLET: 88 | 84 days supply | Qty: 78 | Fill #0

## 2020-03-29 MED FILL — METFORMIN HCL 500 MG TABS: 500 | 90 days supply | Qty: 90 | Fill #0

## 2020-04-02 ENCOUNTER — Telehealth: Payer: Self-pay

## 2020-04-02 MED ORDER — ALLOPURINOL 100 MG PO TABS: 100.0000 mg | ORAL_TABLET | Freq: Every day | ORAL | 3 refills | Status: DC

## 2020-04-02 MED ORDER — LEVOTHYROXINE SODIUM 88 MCG PO TABS: ORAL_TABLET | ORAL | 3 refills | Status: DC

## 2020-04-02 MED ORDER — METFORMIN HCL 500 MG PO TABS: 500.0000 mg | ORAL_TABLET | Freq: Every day | ORAL | 1 refills | Status: DC

## 2020-04-02 NOTE — Telephone Encounter (Signed)
.   LAST APPOINTMENT DATE: 03/28/2020   NEXT APPOINTMENT DATE:@3 /12/2020  MEDICATION:allopurinol (ZYLOPRIM) 100 MG tablet  levothyroxine (SYNTHROID) 88 MCG tablet  metFORMIN (GLUCOPHAGE) 500 MG tablet  PHARMACY:[id number] [place], [place] - [address] [place] [address] [place] [address]   CLINICAL FILLS OUT ALL BELOW:   LAST REFILL:  QTY:  REFILL DATE:    OTHER COMMENTS: PATIENT [facility]S [facility]    Okay for refill?  Please advise

## 2020-04-02 NOTE — Telephone Encounter (Signed)
Refills sent in

## 2020-04-03 MED FILL — oxyCODONE HCL 5 MG/5ML SOLN: 5 | 30 days supply | Qty: 2499 | Fill #0

## 2020-04-03 MED FILL — fentaNYL 50 MCG/HR PT72: 50 | 30 days supply | Qty: 10 | Fill #0

## 2020-04-09 DIAGNOSIS — D220 Melanocytic nevi of lip: Secondary | ICD-10-CM | POA: Diagnosis not present

## 2020-04-09 DIAGNOSIS — L821 Other seborrheic keratosis: Secondary | ICD-10-CM | POA: Diagnosis not present

## 2020-04-09 DIAGNOSIS — D485 Neoplasm of uncertain behavior of skin: Secondary | ICD-10-CM | POA: Diagnosis not present

## 2020-04-19 ENCOUNTER — Other Ambulatory Visit: Payer: Self-pay | Admitting: Family Medicine

## 2020-04-23 DIAGNOSIS — C44329 Squamous cell carcinoma of skin of other parts of face: Secondary | ICD-10-CM | POA: Diagnosis not present

## 2020-05-01 MED FILL — oxyCODONE HCL 5 MG/5ML SOLN: 5 | 30 days supply | Qty: 2499 | Fill #0

## 2020-05-01 MED FILL — fentaNYL 50 MCG/HR PT72: 50 | 30 days supply | Qty: 10 | Fill #0

## 2020-05-03 DIAGNOSIS — C44329 Squamous cell carcinoma of skin of other parts of face: Secondary | ICD-10-CM | POA: Diagnosis not present

## 2020-05-03 DIAGNOSIS — L821 Other seborrheic keratosis: Secondary | ICD-10-CM | POA: Diagnosis not present

## 2020-05-08 DIAGNOSIS — R52 Pain, unspecified: Secondary | ICD-10-CM | POA: Diagnosis not present

## 2020-05-08 DIAGNOSIS — Z79899 Other long term (current) drug therapy: Secondary | ICD-10-CM | POA: Diagnosis not present

## 2020-05-08 DIAGNOSIS — Y842 Radiological procedure and radiotherapy as the cause of abnormal reaction of the patient, or of later complication, without mention of misadventure at the time of the procedure: Secondary | ICD-10-CM | POA: Diagnosis not present

## 2020-05-08 DIAGNOSIS — C099 Malignant neoplasm of tonsil, unspecified: Secondary | ICD-10-CM | POA: Diagnosis not present

## 2020-05-08 DIAGNOSIS — C760 Malignant neoplasm of head, face and neck: Secondary | ICD-10-CM | POA: Diagnosis not present

## 2020-05-10 ENCOUNTER — Other Ambulatory Visit: Payer: Self-pay

## 2020-05-15 ENCOUNTER — Other Ambulatory Visit (HOSPITAL_COMMUNITY): Payer: Self-pay | Admitting: Neurosurgery

## 2020-05-20 ENCOUNTER — Telehealth: Payer: Self-pay

## 2020-05-20 DIAGNOSIS — E039 Hypothyroidism, unspecified: Secondary | ICD-10-CM

## 2020-05-20 NOTE — Telephone Encounter (Signed)
Pt called asking if Dr. Yong Channel would prescribe synthroid to pt instead of generic brand. Pt states the generic medication makes him feel bad. Please advise.

## 2020-05-20 NOTE — Telephone Encounter (Signed)
You can change it to synthroid same dose dispense as written- may be more expensive. Needs repeat TSH in 6 weeks

## 2020-05-20 NOTE — Telephone Encounter (Signed)
Please advise 

## 2020-05-21 ENCOUNTER — Other Ambulatory Visit: Payer: Self-pay | Admitting: Family Medicine

## 2020-05-21 ENCOUNTER — Other Ambulatory Visit: Payer: Self-pay

## 2020-05-21 MED ORDER — LEVOTHYROXINE SODIUM 88 MCG PO TABS
88.0000 ug | ORAL_TABLET | Freq: Every day | ORAL | 3 refills | Status: DC
Start: 2020-05-21 — End: 2020-05-21

## 2020-05-21 MED ORDER — LEVOTHYROXINE SODIUM 88 MCG PO TABS: 88.0000 ug | ORAL_TABLET | Freq: Every day | ORAL | 3 refills | Status: DC

## 2020-05-21 NOTE — Telephone Encounter (Signed)
Pt wife called asking if [facility] [place] not to [facility]. Pt has been scheduled for a lab visit on 07/02/2020.

## 2020-05-21 NOTE — Telephone Encounter (Signed)
Medication has been sent to the correct pharmacy.

## 2020-05-21 NOTE — Addendum Note (Signed)
Addended by: Clyde Lundborg A on: 05/21/2020 07:41 AM   Modules accepted: Orders

## 2020-05-21 NOTE — Telephone Encounter (Signed)
Can you please schedule pt for lab visit only in 6 weeks? Generic sent in and future TSH ordered.

## 2020-05-31 MED FILL — fentaNYL 50 MCG/HR PT72: 50 | 30 days supply | Qty: 10 | Fill #0

## 2020-05-31 MED FILL — oxyCODONE HCL 5 MG/5ML SOLN: 5 | 30 days supply | Qty: 2500 | Fill #0

## 2020-06-24 ENCOUNTER — Inpatient Hospital Stay: Payer: Medicare HMO | Attending: Medical | Admitting: Medical

## 2020-06-27 ENCOUNTER — Other Ambulatory Visit: Payer: Self-pay | Admitting: Neurosurgery

## 2020-06-27 ENCOUNTER — Other Ambulatory Visit (HOSPITAL_COMMUNITY): Payer: Self-pay

## 2020-06-27 MED ORDER — FENTANYL 50 MCG/HR TD PT72
1.0000 | MEDICATED_PATCH | TRANSDERMAL | 0 refills | Status: DC
  Filled 2020-06-27 – 2020-07-01 (×2): qty 10, 30d supply, fill #0

## 2020-06-27 MED ORDER — OXYCODONE HCL 5 MG/5ML PO SOLN
ORAL | 0 refills | Status: DC
  Filled 2020-06-27: qty 2500, 30d supply, fill #0

## 2020-06-28 ENCOUNTER — Other Ambulatory Visit (HOSPITAL_COMMUNITY): Payer: Self-pay

## 2020-07-01 ENCOUNTER — Other Ambulatory Visit (HOSPITAL_COMMUNITY): Payer: Self-pay

## 2020-07-02 ENCOUNTER — Other Ambulatory Visit: Payer: Medicare HMO

## 2020-07-19 ENCOUNTER — Ambulatory Visit: Payer: Medicare HMO

## 2020-07-26 ENCOUNTER — Other Ambulatory Visit (HOSPITAL_COMMUNITY): Payer: Self-pay

## 2020-07-26 MED ORDER — OXYCODONE HCL 5 MG/5ML PO SOLN
ORAL | 0 refills | Status: DC
  Filled 2020-07-26: qty 2500, 30d supply, fill #0

## 2020-07-30 ENCOUNTER — Other Ambulatory Visit (HOSPITAL_COMMUNITY): Payer: Self-pay

## 2020-08-06 ENCOUNTER — Other Ambulatory Visit (HOSPITAL_COMMUNITY): Payer: Self-pay

## 2020-08-06 MED ORDER — FENTANYL 50 MCG/HR TD PT72
1.0000 | MEDICATED_PATCH | TRANSDERMAL | 0 refills | Status: DC
  Filled 2020-08-06: qty 10, 30d supply, fill #0

## 2020-08-06 MED ORDER — FENTANYL 50 MCG/HR TD PT72
MEDICATED_PATCH | TRANSDERMAL | 0 refills | Status: DC
  Filled 2020-08-06: qty 10, 30d supply, fill #0

## 2020-08-08 ENCOUNTER — Other Ambulatory Visit (HOSPITAL_COMMUNITY): Payer: Self-pay

## 2020-08-08 DIAGNOSIS — R52 Pain, unspecified: Secondary | ICD-10-CM | POA: Diagnosis not present

## 2020-08-08 DIAGNOSIS — R69 Illness, unspecified: Secondary | ICD-10-CM | POA: Diagnosis not present

## 2020-08-08 DIAGNOSIS — C760 Malignant neoplasm of head, face and neck: Secondary | ICD-10-CM | POA: Diagnosis not present

## 2020-08-08 DIAGNOSIS — C099 Malignant neoplasm of tonsil, unspecified: Secondary | ICD-10-CM | POA: Diagnosis not present

## 2020-08-08 MED ORDER — FENTANYL 50 MCG/HR TD PT72
1.0000 | MEDICATED_PATCH | TRANSDERMAL | 0 refills | Status: DC
  Filled 2020-09-16: qty 10, 30d supply, fill #0

## 2020-08-08 MED ORDER — FENTANYL 50 MCG/HR TD PT72
1.0000 | MEDICATED_PATCH | TRANSDERMAL | 0 refills | Status: DC
  Filled 2020-10-16: qty 10, 30d supply, fill #0

## 2020-08-08 MED ORDER — OXYCODONE HCL 5 MG/5ML PO SOLN
10.0000 mg | ORAL | 0 refills | Status: DC | PRN
  Filled 2020-10-28: qty 2500, 30d supply, fill #0

## 2020-08-08 MED ORDER — FENTANYL 50 MCG/HR TD PT72
1.0000 | MEDICATED_PATCH | TRANSDERMAL | 0 refills | Status: DC
  Filled 2020-11-14: qty 10, 30d supply, fill #0

## 2020-08-08 MED ORDER — OXYCODONE HCL 5 MG/5ML PO SOLN
10.0000 mg | ORAL | 0 refills | Status: DC | PRN
  Filled 2020-08-29: qty 2500, 30d supply, fill #0

## 2020-08-08 MED ORDER — OXYCODONE HCL 5 MG/5ML PO SOLN
10.0000 mg | ORAL | 0 refills | Status: DC | PRN
  Filled 2020-09-27: qty 2500, 30d supply, fill #0

## 2020-08-16 ENCOUNTER — Other Ambulatory Visit (HOSPITAL_COMMUNITY): Payer: Self-pay

## 2020-08-22 ENCOUNTER — Other Ambulatory Visit: Payer: Self-pay | Admitting: Family Medicine

## 2020-08-26 ENCOUNTER — Other Ambulatory Visit (HOSPITAL_COMMUNITY): Payer: Self-pay

## 2020-08-29 ENCOUNTER — Other Ambulatory Visit (HOSPITAL_COMMUNITY): Payer: Self-pay

## 2020-09-06 ENCOUNTER — Other Ambulatory Visit (HOSPITAL_COMMUNITY): Payer: Self-pay

## 2020-09-16 ENCOUNTER — Other Ambulatory Visit (HOSPITAL_COMMUNITY): Payer: Self-pay

## 2020-09-24 ENCOUNTER — Other Ambulatory Visit (HOSPITAL_COMMUNITY): Payer: Self-pay

## 2020-09-27 ENCOUNTER — Other Ambulatory Visit (HOSPITAL_COMMUNITY): Payer: Self-pay

## 2020-10-09 ENCOUNTER — Other Ambulatory Visit (HOSPITAL_COMMUNITY): Payer: Self-pay

## 2020-10-16 ENCOUNTER — Other Ambulatory Visit (HOSPITAL_COMMUNITY): Payer: Self-pay

## 2020-10-17 ENCOUNTER — Other Ambulatory Visit (HOSPITAL_COMMUNITY): Payer: Self-pay

## 2020-10-20 ENCOUNTER — Other Ambulatory Visit: Payer: Self-pay | Admitting: Family Medicine

## 2020-10-22 ENCOUNTER — Other Ambulatory Visit (HOSPITAL_COMMUNITY): Payer: Self-pay

## 2020-10-28 ENCOUNTER — Other Ambulatory Visit (HOSPITAL_COMMUNITY): Payer: Self-pay

## 2020-11-06 ENCOUNTER — Other Ambulatory Visit (HOSPITAL_COMMUNITY): Payer: Self-pay

## 2020-11-07 ENCOUNTER — Other Ambulatory Visit (HOSPITAL_COMMUNITY): Payer: Self-pay

## 2020-11-07 DIAGNOSIS — C099 Malignant neoplasm of tonsil, unspecified: Secondary | ICD-10-CM | POA: Diagnosis not present

## 2020-11-07 DIAGNOSIS — R52 Pain, unspecified: Secondary | ICD-10-CM | POA: Diagnosis not present

## 2020-11-07 DIAGNOSIS — M5416 Radiculopathy, lumbar region: Secondary | ICD-10-CM | POA: Diagnosis not present

## 2020-11-07 DIAGNOSIS — C760 Malignant neoplasm of head, face and neck: Secondary | ICD-10-CM | POA: Diagnosis not present

## 2020-11-07 DIAGNOSIS — R69 Illness, unspecified: Secondary | ICD-10-CM | POA: Diagnosis not present

## 2020-11-07 DIAGNOSIS — F1120 Opioid dependence, uncomplicated: Secondary | ICD-10-CM | POA: Diagnosis not present

## 2020-11-07 MED ORDER — FENTANYL 50 MCG/HR TD PT72
1.0000 | MEDICATED_PATCH | TRANSDERMAL | 0 refills | Status: DC
  Filled 2021-01-24: qty 10, 30d supply, fill #0

## 2020-11-07 MED ORDER — FENTANYL 50 MCG/HR TD PT72: 1.0000 | MEDICATED_PATCH | TRANSDERMAL | 0 refills | Status: DC

## 2020-11-07 MED ORDER — OXYCODONE HCL 5 MG/5ML PO SOLN
10.0000 mg | ORAL | 0 refills | Status: DC | PRN
  Filled 2021-01-24: qty 2500, 30d supply, fill #0

## 2020-11-07 MED ORDER — OXYCODONE HCL 5 MG/5ML PO SOLN
10.0000 mg | ORAL | 0 refills | Status: DC | PRN
  Filled 2020-11-25: qty 2500, 30d supply, fill #0

## 2020-11-07 MED ORDER — OXYCODONE HCL 5 MG/5ML PO SOLN
10.0000 mg | ORAL | 0 refills | Status: DC | PRN
  Filled 2020-12-24: qty 2500, 30d supply, fill #0

## 2020-11-07 MED ORDER — FENTANYL 50 MCG/HR TD PT72
1.0000 | MEDICATED_PATCH | TRANSDERMAL | 0 refills | Status: DC
  Filled 2020-12-18: qty 10, 30d supply, fill #0

## 2020-11-13 ENCOUNTER — Telehealth: Payer: Self-pay | Admitting: Family Medicine

## 2020-11-13 DIAGNOSIS — C4492 Squamous cell carcinoma of skin, unspecified: Secondary | ICD-10-CM | POA: Diagnosis not present

## 2020-11-13 DIAGNOSIS — Z08 Encounter for follow-up examination after completed treatment for malignant neoplasm: Secondary | ICD-10-CM | POA: Diagnosis not present

## 2020-11-13 DIAGNOSIS — L570 Actinic keratosis: Secondary | ICD-10-CM | POA: Diagnosis not present

## 2020-11-13 DIAGNOSIS — L821 Other seborrheic keratosis: Secondary | ICD-10-CM | POA: Diagnosis not present

## 2020-11-13 DIAGNOSIS — R229 Localized swelling, mass and lump, unspecified: Secondary | ICD-10-CM | POA: Diagnosis not present

## 2020-11-13 DIAGNOSIS — Z85828 Personal history of other malignant neoplasm of skin: Secondary | ICD-10-CM | POA: Diagnosis not present

## 2020-11-13 DIAGNOSIS — D485 Neoplasm of uncertain behavior of skin: Secondary | ICD-10-CM | POA: Diagnosis not present

## 2020-11-13 NOTE — Telephone Encounter (Signed)
Left message for patient to schedule Annual Wellness Visit.  Please schedule with Nurse Health Advisor Tina Betterson, RN at [facility].  Please call [phone] ask for Kathy  

## 2020-11-14 ENCOUNTER — Other Ambulatory Visit (HOSPITAL_COMMUNITY): Payer: Self-pay

## 2020-11-15 ENCOUNTER — Other Ambulatory Visit (HOSPITAL_COMMUNITY): Payer: Self-pay

## 2020-11-15 DIAGNOSIS — D485 Neoplasm of uncertain behavior of skin: Secondary | ICD-10-CM | POA: Diagnosis not present

## 2020-11-22 ENCOUNTER — Other Ambulatory Visit (HOSPITAL_COMMUNITY): Payer: Self-pay

## 2020-11-22 DIAGNOSIS — L570 Actinic keratosis: Secondary | ICD-10-CM | POA: Diagnosis not present

## 2020-11-25 ENCOUNTER — Other Ambulatory Visit (HOSPITAL_COMMUNITY): Payer: Self-pay

## 2020-12-18 ENCOUNTER — Other Ambulatory Visit (HOSPITAL_COMMUNITY): Payer: Self-pay

## 2020-12-19 ENCOUNTER — Other Ambulatory Visit (HOSPITAL_COMMUNITY): Payer: Self-pay

## 2020-12-23 ENCOUNTER — Other Ambulatory Visit (HOSPITAL_COMMUNITY): Payer: Self-pay

## 2020-12-24 ENCOUNTER — Other Ambulatory Visit (HOSPITAL_COMMUNITY): Payer: Self-pay

## 2021-01-06 ENCOUNTER — Other Ambulatory Visit (HOSPITAL_COMMUNITY): Payer: Self-pay

## 2021-01-16 ENCOUNTER — Other Ambulatory Visit (HOSPITAL_COMMUNITY): Payer: Self-pay

## 2021-01-24 ENCOUNTER — Other Ambulatory Visit (HOSPITAL_COMMUNITY): Payer: Self-pay

## 2021-01-28 ENCOUNTER — Other Ambulatory Visit: Payer: Self-pay | Admitting: Family Medicine

## 2021-02-11 ENCOUNTER — Other Ambulatory Visit (HOSPITAL_COMMUNITY): Payer: Self-pay

## 2021-02-11 DIAGNOSIS — R52 Pain, unspecified: Secondary | ICD-10-CM | POA: Diagnosis not present

## 2021-02-11 DIAGNOSIS — C099 Malignant neoplasm of tonsil, unspecified: Secondary | ICD-10-CM | POA: Diagnosis not present

## 2021-02-11 DIAGNOSIS — R69 Illness, unspecified: Secondary | ICD-10-CM | POA: Diagnosis not present

## 2021-02-11 MED ORDER — OXYCODONE HCL 5 MG/5ML PO SOLN
10.0000 mg | ORAL | 0 refills | Status: DC | PRN
  Filled 2021-03-24 (×3): qty 2500, 28d supply, fill #0
  Filled 2021-03-24: qty 2500, 30d supply, fill #0
  Filled 2021-03-24: qty 2500, 28d supply, fill #0
  Filled 2021-03-24: qty 2500, 30d supply, fill #0
  Filled 2021-03-24: qty 2500, 28d supply, fill #0

## 2021-02-11 MED ORDER — FENTANYL 50 MCG/HR TD PT72
1.0000 | MEDICATED_PATCH | TRANSDERMAL | 0 refills | Status: DC
  Filled 2021-04-23: qty 10, 30d supply, fill #0

## 2021-02-11 MED ORDER — OXYCODONE HCL 5 MG/5ML PO SOLN
10.0000 mg | ORAL | 0 refills | Status: DC | PRN
  Filled 2021-02-11: qty 2500, 28d supply, fill #0
  Filled 2021-04-23: qty 2500, 30d supply, fill #0
  Filled ????-??-??: fill #0

## 2021-02-11 MED ORDER — FENTANYL 50 MCG/HR TD PT72
1.0000 | MEDICATED_PATCH | TRANSDERMAL | 0 refills | Status: DC
  Filled 2021-02-21: qty 10, 30d supply, fill #0

## 2021-02-11 MED ORDER — FENTANYL 50 MCG/HR TD PT72
1.0000 | MEDICATED_PATCH | TRANSDERMAL | 0 refills | Status: DC
  Filled 2021-03-24: qty 10, 30d supply, fill #0

## 2021-02-11 MED ORDER — OXYCODONE HCL 5 MG/5ML PO SOLN
10.0000 mg | ORAL | 0 refills | Status: DC | PRN
  Filled 2021-02-21: qty 2500, 30d supply, fill #0

## 2021-02-21 ENCOUNTER — Other Ambulatory Visit (HOSPITAL_COMMUNITY): Payer: Self-pay

## 2021-03-24 ENCOUNTER — Other Ambulatory Visit (HOSPITAL_COMMUNITY): Payer: Self-pay

## 2021-04-02 ENCOUNTER — Telehealth: Payer: Self-pay | Admitting: Family Medicine

## 2021-04-02 NOTE — Telephone Encounter (Signed)
Vml for pt to call back and sch yearly check up per Dr Yong Channel .- lc

## 2021-04-09 ENCOUNTER — Other Ambulatory Visit: Payer: Self-pay | Admitting: Family Medicine

## 2021-04-17 ENCOUNTER — Other Ambulatory Visit (HOSPITAL_COMMUNITY): Payer: Self-pay

## 2021-04-21 ENCOUNTER — Encounter: Payer: Self-pay | Admitting: Family Medicine

## 2021-04-21 ENCOUNTER — Ambulatory Visit (INDEPENDENT_AMBULATORY_CARE_PROVIDER_SITE_OTHER): Payer: Medicare HMO | Admitting: Family Medicine

## 2021-04-21 VITALS — BP 124/72 | HR 62 | Temp 98.5°F | Ht 62.0 in | Wt 137.4 lb

## 2021-04-21 DIAGNOSIS — I10 Essential (primary) hypertension: Secondary | ICD-10-CM

## 2021-04-21 DIAGNOSIS — E039 Hypothyroidism, unspecified: Secondary | ICD-10-CM | POA: Diagnosis not present

## 2021-04-21 DIAGNOSIS — E1142 Type 2 diabetes mellitus with diabetic polyneuropathy: Secondary | ICD-10-CM | POA: Diagnosis not present

## 2021-04-21 DIAGNOSIS — M1000 Idiopathic gout, unspecified site: Secondary | ICD-10-CM | POA: Diagnosis not present

## 2021-04-21 DIAGNOSIS — E782 Mixed hyperlipidemia: Secondary | ICD-10-CM | POA: Diagnosis not present

## 2021-04-21 DIAGNOSIS — R5383 Other fatigue: Secondary | ICD-10-CM | POA: Diagnosis not present

## 2021-04-21 DIAGNOSIS — J0141 Acute recurrent pansinusitis: Secondary | ICD-10-CM

## 2021-04-21 MED ORDER — AMOXICILLIN-POT CLAVULANATE 250-62.5 MG/5ML PO SUSR: 500.0000 mg | Freq: Three times a day (TID) | ORAL | 0 refills | Status: AC

## 2021-04-21 NOTE — Addendum Note (Signed)
Addended by: Marin Olp on: 04/21/2021 08:23 PM   Modules accepted: Orders

## 2021-04-21 NOTE — Patient Instructions (Signed)
Meds have been sent the the pharmacy °You can take tylenol for pain/fevers °If worsening symptoms, let us know or go to the Emergency room  ° ° °

## 2021-04-21 NOTE — Progress Notes (Signed)
Dr. Cherlynn Kaiser informed me patient was willing to come by for labs later this month- he is overdue for chronic labs and helped him arrange an appointment- I am ordering standard labs he is overdue for and we will need to schedule a follow up visit if not already scheduled  Garret Reddish

## 2021-04-21 NOTE — Progress Notes (Signed)
Subjective:     Patient ID: Kevin Mcclain, male    DOB: [birth year], 76 y.o.   MRN: [id number]  Chief Complaint  Patient presents with   Nasal Congestion   Headache   Sinus Problem    Sx started over 1 month ago Has not taking any medication or used any nasal sprays due to taking medications for cancer    HPI-here w/wife-pt [facility] Chief complaint: nasal congestion, runny and post nasal drip. Symptom onset: >1 month Pertinent positives: congestion, post nasal drip-food and vomit can get into sinuses from prev surgery.   Frontal ha-usu wakes up w/it and goes away and then back. No f/c.  Occ cough from drainage.  Vomited 2 days last wk(nothing new)  Occ dizzy.  Has had a lot of sinus issues in past.  Fungal ball in past as well.   Pertinent negatives: no f/c. No major cough Treatments tried: none  Throat ca-rad.  Hearing loss.   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    Hyperlipidemia    Hyperplastic colon polyp    Hypertension    Kidney stones    Rheumatoid arthritis ([place])    Shingles outbreak 04/23/15   left shoulder and left arm   Vertigo     Past Surgical History:  Procedure Laterality Date   CHOLECYSTECTOMY  2012   COLONOSCOPY W/ POLYPECTOMY  2015    CORONARY ARTERY BYPASS GRAFT  2005   6 grafts   CYSTOSCOPY     kidney stone removal   IR GASTROSTOMY TUBE REMOVAL  06/25/2016   LITHOTRIPSY     MULTIPLE EXTRACTIONS WITH ALVEOLOPLASTY N/A 11/14/2014   Procedure: Extraction of tooth #2 and 31 with alveoloplasty after sectioning of bridge at distal #29 with full mouth debridement of remaining dention;  Surgeon: Lenn Cal, DDS;  Location: [place];  Service: Oral Surgery;  Laterality: N/A;   NASAL SINUS SURGERY Right 11/14/2014   Procedure: RIGHT ENDOSCOPIC ANTROSTOMY AND ANTERIOR ETHMOIDECTOMY ;  Surgeon: Jodi Marble, MD;  Location: [place];  Service: ENT;  Laterality: Right;    Outpatient Medications Prior to Visit  Medication Sig Dispense Refill   allopurinol (ZYLOPRIM) 100 MG tablet Take 1 tablet (100 mg total) by mouth daily. 90 tablet 3   Ascorbic Acid (VITAMIN C PO) Take by mouth.     aspirin 81 MG tablet Take 81 mg by mouth daily. Does not take everyday     atorvastatin (LIPITOR) 10 MG tablet TAKE 1 TABLET BY MOUTH EVERY DAY 90 tablet 0   colchicine 0.6 MG tablet TAKE 2 TABLETS BY MOUTH AT 1ST SIGN OF GOUT  FLARE, MAY REPEAT 1 TABLET IN 2HRS, THEN ONCE DAILY UNTIL SYMPTOMS RESOLVE 90 tablet 1   fentaNYL (DURAGESIC) 50 MCG/HR Place 1 patch onto the skin every 72 hours 10 patch 0   fentaNYL (DURAGESIC) 50 MCG/HR Apply 1 patch every 72 hours 10 patch 0   fentaNYL (DURAGESIC) 50 MCG/HR Place 1 patch onto the skin every 72 hours. 10 patch 0   fentaNYL (DURAGESIC) 50 MCG/HR Place 1 patch onto the skin every 72 hours (Fill for 08-31-20) 10 patch 0   fentaNYL (DURAGESIC) 50 MCG/HR Place 1 patch onto the skin every 72 hours. Fill for 10-03-20 10 patch 0   fentaNYL (DURAGESIC) 50 MCG/HR Place 1 patch onto the skin every 72 hours. 10 patch 0   fentaNYL (DURAGESIC) 50 MCG/HR Place 1 patch onto the skin every 3 (three) days. 10 patch 0   fentaNYL (DURAGESIC) 50 MCG/HR Place 1 patch onto the skin every 3 (three) days (every 72 hours). 10 patch 0    fentaNYL (DURAGESIC) 50 MCG/HR Place 1 patch onto the skin every 3 (three) days. 10 patch 0   fentaNYL (DURAGESIC) 50 MCG/HR Apply1 patch onto the skin every 72 hours. 10 patch 0   fentaNYL (DURAGESIC) 50 MCG/HR Place 1 patch onto the skin every 3 (three) days. (DNF 02-22-21) 10 patch 0   [START ON 04/23/2021] fentaNYL (DURAGESIC) 50 MCG/HR Place 1 patch onto the skin every 3 (three) days. (DNF 04-23-21) 10 patch 0   levothyroxine (SYNTHROID) 88 MCG tablet Take 1 tablet (88 mcg total) by mouth daily. 90 tablet 3   metFORMIN (GLUCOPHAGE) 500 MG tablet Take 1 tablet (500 mg total) by mouth daily with breakfast. TAKE 1 TABLET BY MOUTH DAILY WITH BREAKFAST. 90 tablet 1   metoprolol succinate (TOPROL-XL) 25 MG 24 hr tablet TAKE 1 TABLET (25 MG TOTAL) BY MOUTH DAILY. TAKE WITH OR IMMEDIATELY FOLLOWING A MEAL. 90 tablet 3   nortriptyline (PAMELOR) 10 MG capsule TAKE 1 CAPSULE BY MOUTH AT BEDTIME. 90 capsule 2   OVER THE COUNTER MEDICATION Place 1 drop into both eyes 3 (three) times daily as needed (dry eyes/ irritation). Clear Cooling Eye Drops     oxyCODONE (ROXICODONE) 5 MG/5ML solution Take 10 to 15 milliliter by mouth every 4 hours as needed for breakthrough cancer pain. 2500 mL 0   oxyCODONE (ROXICODONE) 5 MG/5ML solution Take 10-15 mLs (10-15 mg total) by mouth every 4 (four) hours as needed for breakthrough cancer pain (DNF until 09-23-20) 2500 mL 0   oxyCODONE (ROXICODONE) 5 MG/5ML solution Take 10-15 mLs (10-15 mg total) by mouth every 4 (four) hours as needed  for breakthrough cancer pain (DNF until 08-24-20) 2500 mL 0   oxyCODONE (ROXICODONE) 5 MG/5ML solution Take 10-15 mLs by mouth every 4 (four) hours as needed  for breakthrough cancer pain 2500 mL 0   oxyCODONE (ROXICODONE) 5 MG/5ML solution Take 10-15 mLs (10-15 mg total) by mouth every 4 (four) hours as needed for breakthrough cancer pain 2500 mL 0   oxyCODONE (ROXICODONE) 5 MG/5ML solution Take 10-15 mLs (10-15 mg total) by mouth every 4 (four)  hours as needed for breakthrough cancer pain 2500 mL 0   oxyCODONE (ROXICODONE) 5 MG/5ML solution Take 10-15 mLs (10-15 mg total) by mouth every 4 (four) hours as needed for breakthrough cancer pain. 2500 mL 0   oxyCODONE (ROXICODONE) 5 MG/5ML solution Take 10-15 mLs (10-15 mg total) by mouth every 4 (four) hours as needed for breakthrough cancer pain. (DNF until 02-22-21) 2500  mL 0   [START ON 04/23/2021] oxyCODONE (ROXICODONE) 5 MG/5ML solution Take 10-15 mLs (10-15 mg total) by mouth every 4 (four) hours as needed for breakthrough cancer pain. (DNF until 04-23-21) 2500 mL 0   prochlorperazine (COMPAZINE) 10 MG tablet Take 1 tablet (10 mg total) by mouth every 6 (six) hours as needed for nausea or vomiting. 30 tablet 5   ramipril (ALTACE) 5 MG capsule TAKE 2 CAPSULES BY MOUTH EVERY MORNING 180 capsule 3   sertraline (ZOLOFT) 100 MG tablet Take 1 tablet (100 mg total) by mouth daily. 90 tablet 3   sodium fluoride (PREVIDENT 5000 PLUS) 1.1 % CREA dental cream Apply to tooth brush. Brush teeth for 2 minutes. Spit out excess-DO NOT swallow. Repeat nightly. 1 Tube prn   VITAMIN D PO Take by mouth.     vitamin E 400 UNIT capsule Take 400 IU daily x 1 week, then 400 IU BID 60 capsule 5   nitroGLYCERIN (NITROSTAT) 0.4 MG SL tablet Place 1 tablet (0.4 mg total) under the tongue every 5 (five) minutes as needed. 50 tablet 3   No facility-administered medications prior to visit.    Allergies  Allergen Reactions   Benzonatate Swelling    "swells my throat"   Codeine Itching   Sitagliptin Phosphate Itching   ROS  No energy.  Tired a lot. Not taking metformin daily as doesn't feel sugar is up.   Tired, tired of hurting all the time.  No labs recently Chronic L arm pain but "not my heart"  Hard to swallow large pills d/t throat.  No SI      Objective:     BP 124/72    Pulse 62    Temp 98.5 F (36.9 C) (Temporal)    Ht 5\' 2"  (1.575 m)    Wt 137 lb 6 oz (62.3 kg)    SpO2 97%    BMI 25.13 kg/m   Wt Readings from Last 3 Encounters:  04/21/21 137 lb 6 oz (62.3 kg)  03/28/20 133 lb 6.4 oz (60.5 kg)  12/22/19 135 lb 6 oz (61.4 kg)        Gen: WDWN NAD WM HOH HEENT: NCAT, conjunctiva not injected, sclera nonicteric TM -hearing aids.  L TM dull.  R normal, OP moist, no exudates.  Nares-?polyp L.  Maxillary sinuses TTP.  Frontal sl tender NECK:  supple, no thyromegaly, no nodes, no carotid bruits. Scarring and tight muscles CARDIAC: RRR, S1S2+, no murmur. DP 2+B LUNGS: CTAB. No wheezes EXT:  no edema MSK: no gross abnormalities.  NEURO: A&O x3.  CN II-XII intact.  PSYCH: normal mood. Good eye contact  Assessment & Plan:   Problem List Items Addressed This Visit   None Visit Diagnoses     Acute recurrent pansinusitis    -  Primary   Relevant Medications   amoxicillin-clavulanate (AUGMENTIN) 250-62.5 MG/5ML suspension   Other fatigue          Sinusitis-augmentin(liquid d/t dysphagia to big pills).  If not improving, needs CT sinuses and poss brain(h/o ca). Fatigue-will return for labs-also thyroid,DM.  Will d/w PCP.  Meds ordered this encounter  Medications   amoxicillin-clavulanate (AUGMENTIN) 250-62.5 MG/5ML suspension    Sig: Take 10 mLs (500 mg total) by mouth 3 (three) times daily for 10 days.    Dispense:  300 mL    Refill:  0    Wellington Hampshire, MD

## 2021-04-22 ENCOUNTER — Telehealth: Payer: Self-pay | Admitting: Family Medicine

## 2021-04-22 ENCOUNTER — Other Ambulatory Visit: Payer: Self-pay | Admitting: Family Medicine

## 2021-04-22 DIAGNOSIS — R112 Nausea with vomiting, unspecified: Secondary | ICD-10-CM

## 2021-04-22 NOTE — Telephone Encounter (Signed)
Pt wife called said pt took first dose of amoxicillin- she is trying to give him second dose but he feels ike throwing up- pt would like to know if you can call in something else.

## 2021-04-22 NOTE — Telephone Encounter (Signed)
Noted  

## 2021-04-22 NOTE — Telephone Encounter (Signed)
Please advise 

## 2021-04-23 ENCOUNTER — Other Ambulatory Visit (HOSPITAL_COMMUNITY): Payer: Self-pay

## 2021-04-24 ENCOUNTER — Other Ambulatory Visit: Payer: Self-pay | Admitting: Family Medicine

## 2021-04-29 ENCOUNTER — Other Ambulatory Visit: Payer: Medicare HMO

## 2021-04-30 ENCOUNTER — Other Ambulatory Visit (HOSPITAL_COMMUNITY): Payer: Self-pay

## 2021-04-30 DIAGNOSIS — R69 Illness, unspecified: Secondary | ICD-10-CM | POA: Diagnosis not present

## 2021-04-30 DIAGNOSIS — C099 Malignant neoplasm of tonsil, unspecified: Secondary | ICD-10-CM | POA: Diagnosis not present

## 2021-04-30 DIAGNOSIS — R52 Pain, unspecified: Secondary | ICD-10-CM | POA: Diagnosis not present

## 2021-04-30 MED ORDER — FENTANYL 50 MCG/HR TD PT72
1.0000 | MEDICATED_PATCH | TRANSDERMAL | 0 refills | Status: DC
  Filled 2021-05-23: qty 10, 30d supply, fill #0

## 2021-04-30 MED ORDER — FENTANYL 50 MCG/HR TD PT72
1.0000 | MEDICATED_PATCH | TRANSDERMAL | 0 refills | Status: DC
  Filled 2021-06-20: qty 10, 30d supply, fill #0
  Filled ????-??-??: fill #0

## 2021-04-30 MED ORDER — OXYCODONE HCL 5 MG/5ML PO SOLN
10.0000 mg | ORAL | 0 refills | Status: DC | PRN
  Filled 2021-05-23: qty 2500, 30d supply, fill #0

## 2021-04-30 MED ORDER — OXYCODONE HCL 5 MG/5ML PO SOLN
10.0000 mg | ORAL | 0 refills | Status: DC | PRN
  Filled 2021-06-20: qty 2500, 30d supply, fill #0
  Filled ????-??-??: fill #0

## 2021-05-16 ENCOUNTER — Other Ambulatory Visit: Payer: Self-pay | Admitting: Family Medicine

## 2021-05-22 ENCOUNTER — Other Ambulatory Visit (HOSPITAL_COMMUNITY): Payer: Self-pay

## 2021-05-23 ENCOUNTER — Other Ambulatory Visit (HOSPITAL_COMMUNITY): Payer: Self-pay

## 2021-05-31 ENCOUNTER — Other Ambulatory Visit: Payer: Self-pay | Admitting: Family Medicine

## 2021-06-19 ENCOUNTER — Other Ambulatory Visit (HOSPITAL_COMMUNITY): Payer: Self-pay

## 2021-06-20 ENCOUNTER — Other Ambulatory Visit (HOSPITAL_COMMUNITY): Payer: Self-pay

## 2021-06-27 ENCOUNTER — Telehealth: Payer: Self-pay | Admitting: Family Medicine

## 2021-06-27 NOTE — Telephone Encounter (Signed)
Patient refused to go to ED - awaiting triage notes- Kevin Mcclain can you please take a look?  ?

## 2021-06-27 NOTE — Telephone Encounter (Signed)
Patient called wanting to be seen by dr hunter or dr Jonni Sanger - patient is sweating, patient is having chest pain "chest bone" - patient has been triaged-  ?

## 2021-06-27 NOTE — Telephone Encounter (Signed)
Called patient to go over his symptoms and see how we can help. His wife answered the call and she stated that he can't talk right now, he took a shower and he is resting, she stated that he said he feels better now and the chest pain comes and goes. She said he is refusing to go to ER. I explained the importance of him getting evaluated asap for his symptoms and that he may need some imaging and studies to determine what's causing his pain, she stated that she and him realize that, but still won't go to ER. She said if he decided to go later today she will let us know.  ?We have no open slots in our office and consulted with Dr.Andy and Dr. Jerline Pain and they both agreed that he needs to go to ER.  ?

## 2021-06-30 ENCOUNTER — Ambulatory Visit: Payer: Medicare HMO | Admitting: Family Medicine

## 2021-06-30 ENCOUNTER — Encounter: Payer: Self-pay | Admitting: Family Medicine

## 2021-06-30 ENCOUNTER — Ambulatory Visit (INDEPENDENT_AMBULATORY_CARE_PROVIDER_SITE_OTHER): Payer: Medicare HMO | Admitting: Family Medicine

## 2021-06-30 VITALS — BP 130/62 | HR 64 | Temp 98.1°F | Ht 62.0 in | Wt 128.4 lb

## 2021-06-30 DIAGNOSIS — R071 Chest pain on breathing: Secondary | ICD-10-CM

## 2021-06-30 DIAGNOSIS — E039 Hypothyroidism, unspecified: Secondary | ICD-10-CM | POA: Diagnosis not present

## 2021-06-30 DIAGNOSIS — I7121 Aneurysm of the ascending aorta, without rupture: Secondary | ICD-10-CM | POA: Diagnosis not present

## 2021-06-30 DIAGNOSIS — R0602 Shortness of breath: Secondary | ICD-10-CM

## 2021-06-30 DIAGNOSIS — E1142 Type 2 diabetes mellitus with diabetic polyneuropathy: Secondary | ICD-10-CM

## 2021-06-30 DIAGNOSIS — I25119 Atherosclerotic heart disease of native coronary artery with unspecified angina pectoris: Secondary | ICD-10-CM | POA: Diagnosis not present

## 2021-06-30 DIAGNOSIS — R079 Chest pain, unspecified: Secondary | ICD-10-CM

## 2021-06-30 NOTE — Telephone Encounter (Signed)
I do not see that patient went to the emergency room-if he has not been evaluated lets try to work him in at 19 today and get an EKG under chest pain-I would likely be running behind as this is a work and but still ideally would arrive by 4 or so we can get his EKG and get basic story to see if we need blood work ?

## 2021-06-30 NOTE — Patient Instructions (Addendum)
Please stop by lab before you go ?If you have mychart- we will send your results within 3 business days of [place] receiving them.  ?If you do not have mychart- we will call you about results within 5 business days of [place] receiving them.  ?*please also note that you will see labs on mychart as soon as they post. I will later go in and write notes on them- will say "notes from Dr. Yong Channel"  ? ?Ideally trying to get CT of chest set up tomorrow (rule out worsening of aneurysm and rule out clot in lungs) ? ?Also did urgent referral to cardiology for their opinion with your heart history ? ? ?Recommended follow up: Return for as needed for new, worsening, persistent symptoms.Marland Kitchen if symptoms worsen such as worsening chest pai or shortness of breath please seek care in emergency department or call 911 ?

## 2021-06-30 NOTE — Progress Notes (Signed)
?Cardiology Office Note:   ? ?Date:  07/01/2021  ? ?Kevin Mcclain, DOB May 29, 1945, MRN [id number] ? ?PCP:  Marin Olp, MD ?  ?[facility] [facility] Providers ?Cardiologist:  None { ? ?Referring MD: Marin Olp, MD  ? ? ?History of Present Illness:   ? ?Kevin Mcclain is a 76 y.o. male with a hx of multiple Mis in the past, CAD s/p CABG in 2005, HTN, HLD, DMII, throat cancer s/p XRT, and GERD who was referred as an urgent visit by Dr. Yong Channel for further evaluation of chest pain.   ? ?Saw Dr. Yong Channel on 06/30/21 with 2 month history of chest pain and associated diaphoresis. Given his history of known CAD with prior CABG x6 in 2005, he was referred urgently to Cardiology for further evaluation. ? ?Today, the patient states that he has been having feeling like he has less energy as well as episodes of SOB where he feels like he "cannot get enough air." Has not been having significant chest pain or arm pain like he had with his MI in the past. No LE edema, orthopnea, PND, palpitations, lightheadedness or syncope. Finds that when he performs an activity, he can get more fatigued and diaphoretic than he used to and has been needing to sit and rest. Symptoms seem to be progressing over the past several months. He states he is able to remain active but is feeling more wiped out than usual. Notably, he has a history of CAD with multiple Mis in the past as well as history of CABG in 2005. He states that one of his MI occurred during a catheterization due to plaque being dislodged which prompted urgent bypass. Has not followed regularly with Cardiology since that time. ? ?Of note, he has a history of throat cancer and has required XRT. Also has history of esophageal strictures requiring dilation. He currently is having difficulty swallowing food and medications. Has frequent episodes of vomiting.  ? ?Past Medical History:  ?Diagnosis Date  ? Allergy   ? Arthritis   ? CAD (coronary artery disease)   ? severe CAD-s/p  MI 2000 with PTCA, s/p CABG 2005-by Dr Vanetta Mulders at [place] EF 50-55%  ? Cancer ([place]) 10/12/2014  ? 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 Diabetes mellitus type II   ? Diverticulosis   ? Gastropathy 2017  ? reactive  ? GERD (gastroesophageal reflux disease)   ? zantac- prn  ? Gout   ? History of radiation therapy 12/03/14- 01/18/15  ? Right Tonsil and Bilateral neck.   ? Hyperlipidemia   ? Hyperplastic colon polyp   ? Hypertension   ? Kidney stones   ? Rheumatoid arthritis ([place])   ? Shingles outbreak 04/23/15  ? left shoulder and left arm  ? Vertigo   ? ? ?Past Surgical History:  ?Procedure Laterality Date  ? CHOLECYSTECTOMY  2012  ? COLONOSCOPY W/ POLYPECTOMY  2015  ? CORONARY ARTERY BYPASS GRAFT  2005  ? 6 grafts  ? CYSTOSCOPY    ? kidney stone removal  ? IR GASTROSTOMY TUBE REMOVAL  06/25/2016  ? LITHOTRIPSY    ? MULTIPLE EXTRACTIONS WITH ALVEOLOPLASTY N/A 11/14/2014  ? Procedure: Extraction of tooth #2 and 31 with alveoloplasty after sectioning of bridge at distal #29 with full mouth debridement of remaining dention;  Surgeon: Lenn Cal, DDS;  Location: [place];  Service: Oral Surgery;  Laterality: N/A;  ? NASAL SINUS SURGERY Right 11/14/2014  ? Procedure: RIGHT ENDOSCOPIC ANTROSTOMY AND  ANTERIOR ETHMOIDECTOMY ;  Surgeon: Jodi Marble, MD;  Location: [place];  Service: ENT;  Laterality: Right;  ? ? ?Current Medications: ?Current Meds  ?Medication Sig  ? allopurinol (ZYLOPRIM) 100 MG tablet Take 1 tablet (100 mg total) by mouth daily.  ? Ascorbic Acid (VITAMIN C PO) Take by mouth.  ? aspirin 81 MG tablet Take 81 mg by mouth daily. Does not take everyday  ? atorvastatin (LIPITOR) 20 MG tablet Take 1 tablet (20 mg total) by mouth daily.  ? colchicine 0.6 MG tablet TAKE 2 TABLETS BY MOUTH AT 1ST SIGN OF GOUT FLARE, MAY REPEAT 1 TABLET IN 2HRS, THEN ONCE DAILY UNTIL SYMPTOMS RESOLVE  ? fentaNYL (DURAGESIC) 50 MCG/HR Place 1 patch onto the skin every 72 hours  ? levothyroxine (SYNTHROID) 88 MCG tablet TAKE 1  TAB 6 DAYS A WEEK AND THEN ON THE SEVENTH DAY ONLY TAKE 1/2 TABLET.  ? metFORMIN (GLUCOPHAGE) 500 MG tablet Take 1 tablet (500 mg total) by mouth daily with breakfast. TAKE 1 TABLET BY MOUTH DAILY WITH BREAKFAST.  ? metoprolol succinate (TOPROL-XL) 25 MG 24 hr tablet TAKE 1 TABLET (25 MG TOTAL) BY MOUTH DAILY. TAKE WITH OR IMMEDIATELY FOLLOWING A MEAL.  ? nortriptyline (PAMELOR) 10 MG capsule TAKE 1 CAPSULE BY MOUTH AT BEDTIME.  ? OVER THE COUNTER MEDICATION Place 1 drop into both eyes 3 (three) times daily as needed (dry eyes/ irritation). Clear Cooling Eye Drops  ? oxyCODONE (ROXICODONE) 5 MG/5ML solution Take 10 to 15 milliliter by mouth every 4 hours as needed for breakthrough cancer pain.  ? oxyCODONE (ROXICODONE) 5 MG/5ML solution Take 10-15 mLs (10-15 mg total) by mouth every 4 (four) hours as needed for breakthrough cancer pain (DNF until 09-23-20)  ? oxyCODONE (ROXICODONE) 5 MG/5ML solution Take 10-15 mLs (10-15 mg total) by mouth every 4 (four) hours as needed  for breakthrough cancer pain (DNF until 08-24-20)  ? oxyCODONE (ROXICODONE) 5 MG/5ML solution Take 10-15 mLs by mouth every 4 (four) hours as needed  for breakthrough cancer pain  ? oxyCODONE (ROXICODONE) 5 MG/5ML solution Take 10-15 mLs (10-15 mg total) by mouth every 4 (four) hours as needed for breakthrough cancer pain  ? oxyCODONE (ROXICODONE) 5 MG/5ML solution Take 10-15 mLs (10-15 mg total) by mouth every 4 (four) hours as needed for breakthrough cancer pain  ? oxyCODONE (ROXICODONE) 5 MG/5ML solution Take 10-15 mLs (10-15 mg total) by mouth every 4 (four) hours as needed for breakthrough cancer pain.  ? oxyCODONE (ROXICODONE) 5 MG/5ML solution Take 10-15 mLs (10-15 mg total) by mouth every 4 (four) hours as needed for breakthrough cancer pain. (DNF until 02-22-21)  ? oxyCODONE (ROXICODONE) 5 MG/5ML solution Take 10-15 mLs (10-15 mg total) by mouth every 4 (four) hours as needed for breakthrough cancer pain. (DNF until 04-23-21)  ? oxyCODONE  (ROXICODONE) 5 MG/5ML solution Take 10-15 mLs (10-15 mg total) by mouth every 4 (four) hours as needed for breakthrough cancer pain  ? oxyCODONE (ROXICODONE) 5 MG/5ML solution Take 10-15 mLs (10-15 mg total) by mouth every 4 (four) hours as needed for breakthrough cancer pain  ? prochlorperazine (COMPAZINE) 10 MG tablet TAKE 1 TABLET BY MOUTH EVERY 6 HOURS AS NEEDED FOR NAUSEA OR VOMITING.  ? ramipril (ALTACE) 5 MG capsule TAKE 2 CAPSULES BY MOUTH EVERY MORNING  ? sertraline (ZOLOFT) 100 MG tablet TAKE 1 TABLET BY MOUTH EVERY DAY  ? sodium fluoride (PREVIDENT 5000 PLUS) 1.1 % CREA dental cream Apply to tooth brush. Brush teeth for 2  minutes. Spit out excess-DO NOT swallow. Repeat nightly.  ? VITAMIN D PO Take by mouth.  ? vitamin E 400 UNIT capsule Take 400 IU daily x 1 week, then 400 IU BID  ? [DISCONTINUED] atorvastatin (LIPITOR) 10 MG tablet TAKE 1 TABLET BY MOUTH EVERY DAY  ?  ? ?Allergies:   Benzonatate, Codeine, and Sitagliptin phosphate  ? ?Social History  ? ?Socioeconomic History  ? Marital status: Married  ?  Spouse name: Not on file  ? Number of children: Not on file  ? Years of education: Not on file  ? Highest education level: Not on file  ?Occupational History  ? Occupation: Retired   ?Tobacco Use  ? Smoking status: Never  ? Smokeless tobacco: Never  ?Vaping Use  ? Vaping Use: Never used  ?Substance and Sexual Activity  ? Alcohol use: No  ? Drug use: No  ? Sexual activity: Not on file  ?Other Topics Concern  ? Not on file  ?Social History Narrative  ? Married 35 years in October 2017.   ?   ? Occupation :  Retired Arboriculturist for [employer].  Worked for [employer]  ?   ? Hobbies: garden and work in yard  ? ?Social Determinants of Health  ? ?Financial Resource Strain: Not on file  ?Food Insecurity: Not on file  ?Transportation Needs: Not on file  ?Physical Activity: Not on file  ?Stress: Not on file  ?Social Connections: Not on file  ?  ? ?Family History: ?The patient's family history  includes CAD in his sister; Colon cancer in his father; Coronary artery disease (age of onset: 32) in his brother; Heart attack in his mother; Heart disease in his father; Lymphoma in his sister; Stroke in his f

## 2021-06-30 NOTE — Telephone Encounter (Signed)
See below

## 2021-06-30 NOTE — Telephone Encounter (Signed)
Spoke with pt's spouse and she will bring him in at 4pm. Adding him to the schedule ?

## 2021-06-30 NOTE — Progress Notes (Signed)
?Phone [phone] ?In person visit ?  ?Subjective:  ? ?Kevin Mcclain is a 76 y.o. year old very pleasant male patient who presents for/with See problem oriented charting ?Chief Complaint  ?Patient presents with  ? Chest Pain  ?  Pt c/o chest pain and sweats over the weekend that has been going on for at least 2 months. He c/o sweating while sleeping.   ? ? ?Past Medical History-  ?Patient Active Problem List  ? Diagnosis Date Noted  ? Ascending aortic aneurysm ([place]) 03/25/2016  ?  Priority: High  ? 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  Priority: High  ? Osteoarthritis 11/18/2010  ?  Priority: High  ? CAD (coronary artery disease) 10/11/2008  ?  Priority: High  ? Type 2 diabetes mellitus with peripheral neuropathy ([place]) 01/24/2007  ?  Priority: High  ? BPH associated with nocturia 10/30/2016  ?  Priority: Medium   ? Hypothyroidism 09/11/2016  ?  Priority: Medium   ? Depression, major, single episode, moderate ([place]) 04/02/2016  ?  Priority: Medium   ? Diabetic neuropathy ([place]) 12/26/2015  ?  Priority: Medium   ? Gout 03/28/2007  ?  Priority: Medium   ? HYPERLIPIDEMIA 01/24/2007  ?  Priority: Medium   ? Essential hypertension 01/24/2007  ?  Priority: Medium   ? Senile purpura ([place]) 03/17/2017  ?  Priority: Low  ? B12 deficiency 01/15/2017  ?  Priority: Low  ? Herpes zoster 07/15/2015  ?  Priority: Low  ? GERD (gastroesophageal reflux disease) 06/10/2015  ?  Priority: Low  ? Arthropathy 03/10/2013  ?  Priority: Low  ? Low back pain 11/26/2011  ?  Priority: Low  ? Kidney stone 11/04/2011  ?  Priority: Low  ? Drug-induced polyneuropathy ([place]) 07/10/2019  ? Atherosclerosis of aorta ([place]) 07/10/2019  ? ? ?Medications- reviewed and updated ?Current Outpatient Medications  ?Medication Sig Dispense Refill  ? allopurinol (ZYLOPRIM) 100 MG tablet Take 1 tablet (100 mg total) by mouth daily. 90 tablet 3  ? Ascorbic Acid (VITAMIN C PO) Take by mouth.    ? aspirin 81 MG tablet Take 81 mg  by mouth daily. Does not take everyday    ? atorvastatin (LIPITOR) 10 MG tablet TAKE 1 TABLET BY MOUTH EVERY DAY 90 tablet 0  ? colchicine 0.6 MG tablet TAKE 2 TABLETS BY MOUTH AT 1ST SIGN OF GOUT FLARE, MAY REPEAT 1 TABLET IN 2HRS, THEN ONCE DAILY UNTIL SYMPTOMS RESOLVE 90 tablet 1  ? fentaNYL (DURAGESIC) 50 MCG/HR Place 1 patch onto the skin every 72 hours 10 patch 0  ? levothyroxine (SYNTHROID) 88 MCG tablet TAKE 1 TAB 6 DAYS A WEEK AND THEN ON THE SEVENTH DAY ONLY TAKE 1/2 TABLET. 90 tablet 3  ? metFORMIN (GLUCOPHAGE) 500 MG tablet Take 1 tablet (500 mg total) by mouth daily with breakfast. TAKE 1 TABLET BY MOUTH DAILY WITH BREAKFAST. 90 tablet 1  ? metoprolol succinate (TOPROL-XL) 25 MG 24 hr tablet TAKE 1 TABLET (25 MG TOTAL) BY MOUTH DAILY. TAKE WITH OR IMMEDIATELY FOLLOWING A MEAL. 90 tablet 3  ? nortriptyline (PAMELOR) 10 MG capsule TAKE 1 CAPSULE BY MOUTH AT BEDTIME. 90 capsule 2  ? OVER THE COUNTER MEDICATION Place 1 drop into both eyes 3 (three) times daily as needed (dry eyes/ irritation). Clear Cooling Eye Drops    ? oxyCODONE (ROXICODONE) 5 MG/5ML solution Take 10 to 15 milliliter by mouth every 4 hours as needed for breakthrough cancer pain.  2500 mL 0  ? oxyCODONE (ROXICODONE) 5 MG/5ML solution Take 10-15 mLs (10-15 mg total) by mouth every 4 (four) hours as needed for breakthrough cancer pain (DNF until 09-23-20) 2500 mL 0  ? oxyCODONE (ROXICODONE) 5 MG/5ML solution Take 10-15 mLs (10-15 mg total) by mouth every 4 (four) hours as needed  for breakthrough cancer pain (DNF until 08-24-20) 2500 mL 0  ? oxyCODONE (ROXICODONE) 5 MG/5ML solution Take 10-15 mLs by mouth every 4 (four) hours as needed  for breakthrough cancer pain 2500 mL 0  ? oxyCODONE (ROXICODONE) 5 MG/5ML solution Take 10-15 mLs (10-15 mg total) by mouth every 4 (four) hours as needed for breakthrough cancer pain 2500 mL 0  ? oxyCODONE (ROXICODONE) 5 MG/5ML solution Take 10-15 mLs (10-15 mg total) by mouth every 4 (four) hours as needed  for breakthrough cancer pain 2500 mL 0  ? oxyCODONE (ROXICODONE) 5 MG/5ML solution Take 10-15 mLs (10-15 mg total) by mouth every 4 (four) hours as needed for breakthrough cancer pain. 2500 mL 0  ? oxyCODONE (ROXICODONE) 5 MG/5ML solution Take 10-15 mLs (10-15 mg total) by mouth every 4 (four) hours as needed for breakthrough cancer pain. (DNF until 02-22-21) 2500 mL 0  ? oxyCODONE (ROXICODONE) 5 MG/5ML solution Take 10-15 mLs (10-15 mg total) by mouth every 4 (four) hours as needed for breakthrough cancer pain. (DNF until 04-23-21) 2500 mL 0  ? oxyCODONE (ROXICODONE) 5 MG/5ML solution Take 10-15 mLs (10-15 mg total) by mouth every 4 (four) hours as needed for breakthrough cancer pain 2500 mL 0  ? oxyCODONE (ROXICODONE) 5 MG/5ML solution Take 10-15 mLs (10-15 mg total) by mouth every 4 (four) hours as needed for breakthrough cancer pain 2500 mL 0  ? prochlorperazine (COMPAZINE) 10 MG tablet TAKE 1 TABLET BY MOUTH EVERY 6 HOURS AS NEEDED FOR NAUSEA OR VOMITING. 30 tablet 5  ? ramipril (ALTACE) 5 MG capsule TAKE 2 CAPSULES BY MOUTH EVERY MORNING 180 capsule 3  ? sertraline (ZOLOFT) 100 MG tablet TAKE 1 TABLET BY MOUTH EVERY DAY 90 tablet 3  ? sodium fluoride (PREVIDENT 5000 PLUS) 1.1 % CREA dental cream Apply to tooth brush. Brush teeth for 2 minutes. Spit out excess-DO NOT swallow. Repeat nightly. 1 Tube prn  ? VITAMIN D PO Take by mouth.    ? vitamin E 400 UNIT capsule Take 400 IU daily x 1 week, then 400 IU BID 60 capsule 5  ? nitroGLYCERIN (NITROSTAT) 0.4 MG SL tablet Place 1 tablet (0.4 mg total) under the tongue every 5 (five) minutes as needed. 50 tablet 3  ? ?No current facility-administered medications for this visit.  ? ?  ?Objective:  ?BP 130/62   Pulse 64   Temp 98.1 ?F (36.7 ?C)   Ht '5\' 2"'$  (1.575 m)   Wt 128 lb 6.4 oz (58.2 kg)   SpO2 98%   BMI 23.48 kg/m?  ?Gen: NAD, resting comfortably ?CV: RRR no murmurs rubs or gallops ?Lungs: CTAB no crackles, wheeze, rhonchi- grabs at chest when takes deep  breath and states that is normal area of pain for him but not feeling today ?Ext: no edema ?Skin: warm, dry ?Neuro: grossly normal, moves all extremities ? ? ?EKG: sinus rhythm with rate 68, normal axis, normal pr and qt interval (borderline prolonged) with prolonged QRS  intervals, no hypertrophy, possible q waves in v1 and v2 btu appear largely stable from last EKG 09/12/20, t wavs In opposite direction as v1-v4  qrs compex with flattening in avl and  avf,  st or t wave changes ? ?  ? ?Assessment and Plan  ? ?# chest pain/difficulty with deep breath in patient with CAD history and aneurysm history ?S:  ?Symptoms include 2 months of symptoms getting more common.  ?Inability to take a deep breath at times and other times can breathe ok. Two months of symptoms and getting worse. Feels winded and can cause chest discomfort ?Fatigue for 2 months that is worsening.  ?Night sweats for 2 months and worsening- wife describes as if he has been working out in yard but he is simply laying there no clothes on.  ?He declines chest pain but wife states he has had to take nitroglycerin at times- states he rubs his chest and states he is hurting acros chest- nitroglycerin has not helped. Pain is not worse with activity per his report- wife states she saw him pushing out trash cans and when he came back he was exhausted/shortness of breath.  ?He feels like symptoms are somewhat mild but wife is very concerned.  ? ?Denies active symptoms today - states they come and go- were worse over the weekend ? ?He has multiple risk factors for this being heart related with known CAD history (history of angioplasty at age 15, CABG x6 in 2005 at [facility]), family history in mother at age 21 due to myocardial infarction, brother with CABG history, personal aortic atherosclerosis, diabetes.  Last seen by cardiology in 2010 but records are not visible ? ?Known aortic aneurysm on 04/01/16- "he mild thoracic aortic aneurysm is unchanged in caliber  from08/29/2016. I discussed this with Dr. Erik Obey at 84:66 a.m. on ?04/01/2016." ? ?A/P: 76 year old male with known CAD as well as known ascending aortic aneurysm whose  follow-up has been less frequent during COVID-19 p

## 2021-07-01 ENCOUNTER — Encounter: Payer: Self-pay | Admitting: Radiation Oncology

## 2021-07-01 ENCOUNTER — Ambulatory Visit: Payer: Medicare HMO | Admitting: Cardiology

## 2021-07-01 ENCOUNTER — Encounter: Payer: Self-pay | Admitting: Cardiology

## 2021-07-01 VITALS — BP 126/60 | HR 61 | Ht 62.0 in | Wt 129.0 lb

## 2021-07-01 DIAGNOSIS — R011 Cardiac murmur, unspecified: Secondary | ICD-10-CM | POA: Diagnosis not present

## 2021-07-01 DIAGNOSIS — I25118 Atherosclerotic heart disease of native coronary artery with other forms of angina pectoris: Secondary | ICD-10-CM | POA: Diagnosis not present

## 2021-07-01 DIAGNOSIS — R0602 Shortness of breath: Secondary | ICD-10-CM | POA: Diagnosis not present

## 2021-07-01 DIAGNOSIS — E785 Hyperlipidemia, unspecified: Secondary | ICD-10-CM

## 2021-07-01 DIAGNOSIS — I10 Essential (primary) hypertension: Secondary | ICD-10-CM | POA: Diagnosis not present

## 2021-07-01 DIAGNOSIS — I7121 Aneurysm of the ascending aorta, without rupture: Secondary | ICD-10-CM

## 2021-07-01 DIAGNOSIS — K222 Esophageal obstruction: Secondary | ICD-10-CM | POA: Diagnosis not present

## 2021-07-01 LAB — COMPREHENSIVE METABOLIC PANEL
ALT: 17 U/L (ref 0–53)
AST: 22 U/L (ref 0–37)
Albumin: 3.5 g/dL (ref 3.5–5.2)
Alkaline Phosphatase: 82 U/L (ref 39–117)
BUN: 18 mg/dL (ref 6–23)
CO2: 28 mEq/L (ref 19–32)
Calcium: 8.8 mg/dL (ref 8.4–10.5)
Chloride: 99 mEq/L (ref 96–112)
Creatinine, Ser: 1.08 mg/dL (ref 0.40–1.50)
GFR: 66.01 mL/min (ref >60.00)
Glucose, Bld: 158 mg/dL — ABNORMAL HIGH (ref 70–99)
Potassium: 3.5 mEq/L (ref 3.5–5.1)
Sodium: 138 mEq/L (ref 135–145)
Total Bilirubin: 1.1 mg/dL (ref 0.2–1.2)
Total Protein: 6.1 g/dL (ref 6.0–8.3)

## 2021-07-01 LAB — CBC WITH DIFFERENTIAL/PLATELET
Basophils Absolute: 0.1 10*3/uL (ref 0.0–0.1)
Basophils Relative: 1.1 % (ref 0.0–3.0)
Eosinophils Absolute: 0.2 10*3/uL (ref 0.0–0.7)
Eosinophils Relative: 2.6 % (ref 0.0–5.0)
HCT: 38.1 % — ABNORMAL LOW (ref 39.0–52.0)
Hemoglobin: 13 g/dL (ref 13.0–17.0)
Lymphocytes Relative: 11.3 % — ABNORMAL LOW (ref 12.0–46.0)
Lymphs Abs: 0.9 10*3/uL (ref 0.7–4.0)
MCHC: 34.1 g/dL (ref 30.0–36.0)
MCV: 87.2 fl (ref 78.0–100.0)
Monocytes Absolute: 0.6 10*3/uL (ref 0.1–1.0)
Monocytes Relative: 7.9 % (ref 3.0–12.0)
Neutro Abs: 6.1 10*3/uL (ref 1.4–7.7)
Neutrophils Relative %: 77.1 % — ABNORMAL HIGH (ref 43.0–77.0)
Platelets: 374 10*3/uL (ref 150.0–400.0)
RBC: 4.37 Mil/uL (ref 4.22–5.81)
RDW: 13.7 % (ref 11.5–15.5)
WBC: 7.8 10*3/uL (ref 4.0–10.5)

## 2021-07-01 LAB — TSH: TSH: 8.93 u[IU]/mL — ABNORMAL HIGH (ref 0.35–5.50)

## 2021-07-01 LAB — HEMOGLOBIN A1C: Hgb A1c MFr Bld: 6.3 % (ref 4.6–6.5)

## 2021-07-01 MED ORDER — ATORVASTATIN CALCIUM 20 MG PO TABS: 20.0000 mg | ORAL_TABLET | Freq: Every day | ORAL | 3 refills | Status: DC

## 2021-07-01 NOTE — Patient Instructions (Signed)
Medication Instructions:  ? ?INCREASE YOUR ATORVASTATIN (LIPITOR) TO 20 MG BY MOUTH DAILY ? ?*If you need a refill on your cardiac medications before your next appointment, please call your pharmacy* ? ? ?Lab Work: ? ?IN 6 WEEKS HERE IN THE OFFICE--LIPIDS--COME FASTING TO THIS LAB APPOINTMENT ? ?If you have labs (blood work) drawn today and your tests are completely normal, you will receive your results only by: ?MyChart Message (if you have MyChart) OR ?A paper copy in the mail ?If you have any lab test that is abnormal or we need to change your treatment, we will call you to review the results. ? ? ?Testing/Procedures: ? ?Your physician has requested that you have an echocardiogram. Echocardiography is a painless test that uses sound waves to create images of your heart. It provides your doctor with information about the size and shape of your heart and how well your heart?s chambers and valves are working. This procedure takes approximately one hour. There are no restrictions for this procedure.  SCHEDULE THIS TO BE DONE THIS WEEK PER DR. Johney Frame ? ? ? ?How to Prepare for Your Cardiac PET/CT Stress Test: ? ?1. Please do not take these medications before your test:  ? ?Medications that may interfere with the cardiac pharmacological stress agent (ex. nitrates or beta-blockers) the day of the exam.  HOLD YOUR METOPROLOL THE DAY OF THIS TEST ? ?Your remaining medications may be taken with water. ? ?2. Nothing to eat or drink, except water, 3 hours prior to arrival time.   ?NO caffeine/decaffeinated products, or chocolate 12 hours prior to arrival. ? ?3. NO perfume, cologne or lotion ? ?4. Total time is 1 to 2 hours; you may want to bring reading material for the waiting time. ? ?5. Please report to Admitting at the [facility] 60 minutes early for your test. ? [facility] ? [address], [place] [address] ? ?Diabetic Preparation:  ?Hold oral medications.  HOLD METFORMIN ?You may take NPH and  Lantus insulin. ?Do not take Humalog or Humulin R (Regular Insulin) the day of your test. ?Check blood sugars prior to leaving the house. ?If able to eat breakfast prior to 3 hour fasting, you may take all medications, including your insulin, ?Do not worry if you miss your breakfast dose of insulin - start at your next meal. ? ?IF YOU THINK YOU MAY BE PREGNANT, OR ARE NURSING PLEASE INFORM THE TECHNOLOGIST. ? ?In preparation for your appointment, medication and supplies will be purchased.  Appointment availability is limited, so if you need to cancel or reschedule, please call the Radiology Department at [phone]  24 hours in advance to avoid a cancellation fee of $100.00 ? ?What to Expect After you Arrive: ? ?Once you arrive and check in for your appointment, you will be taken to a preparation room within the Radiology Department.  A technologist or Nurse will obtain your medical history, verify that you are correctly prepped for the exam, and explain the procedure.  Afterwards,  an IV will be started in your arm and electrodes will be placed on your skin for EKG monitoring during the stress portion of the exam. Then you will be escorted to the PET/CT scanner.  There, staff will get you positioned on the scanner and obtain a blood pressure and EKG.  During the exam, you will continue to be connected to the EKG and blood pressure machines.  A small, safe amount of a radioactive tracer will be injected in your  IV to obtain a series of pictures of your heart along with an injection of a stress agent.   ? ?After your Exam: ? ?It is recommended that you eat a meal and drink a caffeinated beverage to counter act any effects of the stress agent.  Drink plenty of fluids for the remainder of the day and urinate frequently for the first couple of hours after the exam.  Your doctor will inform you of your test results within 7-10 business days. ? ?For questions about your test or how to prepare for your test, please  call: ?Marchia Bond, Cardiac Imaging Nurse Navigator  ?Gordy Clement, Cardiac Imaging Nurse Navigator ?Office: [phone] ? ? ? ?Follow-Up: ? ?3 MONTHS WITH DR. Johney Frame OR AN EXTENDER ? ? ?Important Information About Sugar ? ? ? ? ? ? ?

## 2021-07-02 ENCOUNTER — Other Ambulatory Visit: Payer: Self-pay

## 2021-07-02 DIAGNOSIS — E039 Hypothyroidism, unspecified: Secondary | ICD-10-CM

## 2021-07-02 MED ORDER — LEVOTHYROXINE SODIUM 88 MCG PO TABS: 88.0000 ug | ORAL_TABLET | Freq: Every day | ORAL | 3 refills | Status: DC

## 2021-07-04 ENCOUNTER — Ambulatory Visit (HOSPITAL_COMMUNITY)
Admission: RE | Admit: 2021-07-04 | Discharge: 2021-07-04 | Disposition: A | Discharge status: Home / Self Care | Payer: Medicare HMO | Source: Ambulatory Visit | Attending: Cardiology | Admitting: Cardiology

## 2021-07-04 DIAGNOSIS — R0602 Shortness of breath: Secondary | ICD-10-CM | POA: Diagnosis not present

## 2021-07-04 DIAGNOSIS — E785 Hyperlipidemia, unspecified: Secondary | ICD-10-CM | POA: Diagnosis not present

## 2021-07-04 DIAGNOSIS — I10 Essential (primary) hypertension: Secondary | ICD-10-CM | POA: Diagnosis not present

## 2021-07-04 DIAGNOSIS — R011 Cardiac murmur, unspecified: Secondary | ICD-10-CM | POA: Diagnosis not present

## 2021-07-04 DIAGNOSIS — I080 Rheumatic disorders of both mitral and aortic valves: Secondary | ICD-10-CM | POA: Insufficient documentation

## 2021-07-04 DIAGNOSIS — I2510 Atherosclerotic heart disease of native coronary artery without angina pectoris: Secondary | ICD-10-CM | POA: Insufficient documentation

## 2021-07-04 DIAGNOSIS — E119 Type 2 diabetes mellitus without complications: Secondary | ICD-10-CM | POA: Diagnosis not present

## 2021-07-04 LAB — ECHOCARDIOGRAM COMPLETE
Area-P 1/2: 5.09 cm2
MV M vel: 4.99 m/s
MV Peak grad: 99.6 mmHg
P 1/2 time: 381 msec
Radius: 0.5 cm
S' Lateral: 4.2 cm
Single Plane A2C EF: 47.1 %
Single Plane A4C EF: 54.4 %

## 2021-07-04 NOTE — Progress Notes (Incomplete)
?  Echocardiogram ?2D Echocardiogram has been performed. ? ?Kevin Mcclain ?07/04/2021, 3:01 PM ?

## 2021-07-07 ENCOUNTER — Telehealth: Payer: Self-pay

## 2021-07-07 ENCOUNTER — Ambulatory Visit (HOSPITAL_BASED_OUTPATIENT_CLINIC_OR_DEPARTMENT_OTHER): Payer: Medicare HMO

## 2021-07-07 MED ORDER — LEVOTHYROXINE SODIUM 100 MCG PO TABS: 100.0000 ug | ORAL_TABLET | Freq: Every day | ORAL | 3 refills | Status: DC

## 2021-07-07 NOTE — Telephone Encounter (Signed)
Spoke to [place] today and gave her the appointment time for the patients CT today- she also mentioned that she was told we would be increasing levothyroxine to 88 mcg but she stated he is already on 88 mcg and wants to know if it should be increased-please advise ?

## 2021-07-07 NOTE — Telephone Encounter (Signed)
My apologies-should be increased to levothyroxine 100 mcg-team please update this daily #90 with 3 refills but still having come in for repeat TSH at previously planned time ?

## 2021-07-07 NOTE — Telephone Encounter (Signed)
Rx sent in

## 2021-07-08 ENCOUNTER — Telehealth: Payer: Self-pay | Admitting: *Deleted

## 2021-07-08 DIAGNOSIS — I340 Nonrheumatic mitral (valve) insufficiency: Secondary | ICD-10-CM

## 2021-07-08 NOTE — Telephone Encounter (Signed)
Spoke with the pt and wife and informed them both that Dr. Johney Frame does want the pt to get a Cardiac MRI done for MR. Both parties aware this will be in addition to all his other test that our scheduled.  ?Informed both parties that I will put the order in the system and send a message to our Scheduler to call him back and arrange this appt.  ?Both parties verbalized understanding and agrees with this plan. ? ? ? ? ? ? ?

## 2021-07-08 NOTE — Telephone Encounter (Signed)
-----   Message from Freada Bergeron, MD sent at 07/08/2021 10:53 AM EDT ----- ?Can we ensure CMR is ordered for MR ?

## 2021-07-10 ENCOUNTER — Other Ambulatory Visit (HOSPITAL_COMMUNITY): Payer: Self-pay

## 2021-07-10 DIAGNOSIS — R52 Pain, unspecified: Secondary | ICD-10-CM | POA: Diagnosis not present

## 2021-07-10 DIAGNOSIS — R69 Illness, unspecified: Secondary | ICD-10-CM | POA: Diagnosis not present

## 2021-07-10 DIAGNOSIS — C099 Malignant neoplasm of tonsil, unspecified: Secondary | ICD-10-CM | POA: Diagnosis not present

## 2021-07-10 MED ORDER — FENTANYL 50 MCG/HR TD PT72
1.0000 | MEDICATED_PATCH | TRANSDERMAL | 0 refills | Status: DC
  Filled 2021-08-18: qty 10, 30d supply, fill #0

## 2021-07-10 MED ORDER — FENTANYL 50 MCG/HR TD PT72
1.0000 | MEDICATED_PATCH | TRANSDERMAL | 0 refills | Status: DC
  Filled 2021-07-10 – 2021-07-18 (×2): qty 10, 30d supply, fill #0

## 2021-07-10 MED ORDER — OXYCODONE HCL 5 MG/5ML PO SOLN
10.0000 mg | ORAL | 0 refills | Status: DC | PRN
  Filled 2021-07-18: qty 2500, 30d supply, fill #0

## 2021-07-10 MED ORDER — OXYCODONE HCL 5 MG/5ML PO SOLN
10.0000 mg | ORAL | 0 refills | Status: DC | PRN
  Filled 2021-08-18: qty 2500, 30d supply, fill #0

## 2021-07-10 MED ORDER — OXYCODONE HCL 5 MG/5ML PO SOLN
10.0000 mg | ORAL | 0 refills | Status: DC | PRN
Start: 2021-09-15 — End: 2021-10-28
  Filled 2021-09-15: qty 2500, 30d supply, fill #0

## 2021-07-10 MED ORDER — FENTANYL 50 MCG/HR TD PT72
1.0000 | MEDICATED_PATCH | TRANSDERMAL | 0 refills | Status: DC
  Filled 2021-09-15: qty 10, 30d supply, fill #0

## 2021-07-15 ENCOUNTER — Ambulatory Visit (HOSPITAL_BASED_OUTPATIENT_CLINIC_OR_DEPARTMENT_OTHER): Payer: Medicare HMO

## 2021-07-15 ENCOUNTER — Other Ambulatory Visit (HOSPITAL_COMMUNITY): Payer: Self-pay

## 2021-07-17 ENCOUNTER — Ambulatory Visit (HOSPITAL_BASED_OUTPATIENT_CLINIC_OR_DEPARTMENT_OTHER)
Admission: RE | Admit: 2021-07-17 | Discharge: 2021-07-17 | Disposition: A | Discharge status: Home / Self Care | Payer: Medicare HMO | Source: Ambulatory Visit | Attending: Family Medicine | Admitting: Family Medicine

## 2021-07-17 DIAGNOSIS — J9811 Atelectasis: Secondary | ICD-10-CM | POA: Diagnosis not present

## 2021-07-17 DIAGNOSIS — Z9049 Acquired absence of other specified parts of digestive tract: Secondary | ICD-10-CM | POA: Diagnosis not present

## 2021-07-17 DIAGNOSIS — R071 Chest pain on breathing: Secondary | ICD-10-CM | POA: Insufficient documentation

## 2021-07-17 DIAGNOSIS — J90 Pleural effusion, not elsewhere classified: Secondary | ICD-10-CM | POA: Diagnosis not present

## 2021-07-17 DIAGNOSIS — R531 Weakness: Secondary | ICD-10-CM | POA: Diagnosis not present

## 2021-07-17 MED ORDER — IOHEXOL 350 MG/ML SOLN
100.0000 mL | Freq: Once | INTRAVENOUS | Status: AC | PRN
  Administered 2021-07-17: 62 mL via INTRAVENOUS

## 2021-07-18 ENCOUNTER — Other Ambulatory Visit (HOSPITAL_COMMUNITY): Payer: Self-pay

## 2021-07-18 ENCOUNTER — Ambulatory Visit (INDEPENDENT_AMBULATORY_CARE_PROVIDER_SITE_OTHER): Payer: Medicare HMO

## 2021-07-18 DIAGNOSIS — Z0000 Encounter for general adult medical examination without abnormal findings: Secondary | ICD-10-CM | POA: Diagnosis not present

## 2021-07-18 NOTE — Progress Notes (Signed)
Virtual Visit via Telephone Note  I connected with  Kevin Mcclain on 07/18/21 at  2:00 PM EDT by telephone and verified that I am speaking with the correct person using two identifiers.  Medicare Annual Wellness visit completed telephonically due to Covid-19 pandemic.   Persons participating in this call: This Health Coach and this patient along with wife [name]   Location: Patient: Home Provider: Office   I discussed the limitations, risks, security and privacy concerns of performing an evaluation and management service by telephone and the availability of in person appointments. The patient expressed understanding and agreed to proceed.  Unable to perform video visit due to video visit attempted and failed and/or patient does not have video capability.   Some vital signs may be absent or patient reported.   Willette Brace, LPN   Subjective:   Kevin Mcclain is a 76 y.o. male who presents for Medicare Annual/Subsequent preventive examination.  Review of Systems     Cardiac Risk Factors include: advanced age (>33mn, >>105women);dyslipidemia;male gender;hypertension;diabetes mellitus     Objective:    There were no vitals filed for this visit. There is no height or weight on file to calculate BMI.     07/18/2021    2:09 PM 12/22/2019   11:36 AM 01/07/2018   10:55 AM 07/02/2017    8:45 AM 06/16/2017    2:08 PM 03/16/2017    2:30 PM 12/17/2016    1:36 PM  Advanced Directives  Does Patient Have a Medical Advance Directive? No No No No No No No  Type of Advance Directive [place] Chart? No - copy requested        Would patient like information on creating a medical advance directive?  No - Patient declined No - Patient declined No - Patient declined No - Patient declined No - Patient declined No - Patient declined    Current Medications (verified) Outpatient Encounter Medications as of 07/18/2021   Medication Sig   allopurinol (ZYLOPRIM) 100 MG tablet Take 1 tablet (100 mg total) by mouth daily.   Ascorbic Acid (VITAMIN C PO) Take by mouth.   aspirin 81 MG tablet Take 81 mg by mouth daily. Does not take everyday   atorvastatin (LIPITOR) 20 MG tablet Take 1 tablet (20 mg total) by mouth daily.   colchicine 0.6 MG tablet TAKE 2 TABLETS BY MOUTH AT 1ST SIGN OF GOUT FLARE, MAY REPEAT 1 TABLET IN 2HRS, THEN ONCE DAILY UNTIL SYMPTOMS RESOLVE   fentaNYL (DURAGESIC) 50 MCG/HR Place 1 patch onto the skin every 3 (three) days.DNF until 07/18/21   levothyroxine (SYNTHROID) 100 MCG tablet Take 1 tablet (100 mcg total) by mouth daily before breakfast.   metFORMIN (GLUCOPHAGE) 500 MG tablet Take 1 tablet (500 mg total) by mouth daily with breakfast. TAKE 1 TABLET BY MOUTH DAILY WITH BREAKFAST.   metoprolol succinate (TOPROL-XL) 25 MG 24 hr tablet TAKE 1 TABLET (25 MG TOTAL) BY MOUTH DAILY. TAKE WITH OR IMMEDIATELY FOLLOWING A MEAL.   nortriptyline (PAMELOR) 10 MG capsule TAKE 1 CAPSULE BY MOUTH AT BEDTIME.   OVER THE COUNTER MEDICATION Place 1 drop into both eyes 3 (three) times daily as needed (dry eyes/ irritation). Clear Cooling Eye Drops   oxyCODONE (ROXICODONE) 5 MG/5ML solution Take 10 to 15 milliliter by mouth every 4 hours as needed for breakthrough cancer pain.   prochlorperazine (COMPAZINE) 10 MG tablet TAKE 1  TABLET BY MOUTH EVERY 6 HOURS AS NEEDED FOR NAUSEA OR VOMITING.   ramipril (ALTACE) 5 MG capsule TAKE 2 CAPSULES BY MOUTH EVERY MORNING   sertraline (ZOLOFT) 100 MG tablet TAKE 1 TABLET BY MOUTH EVERY DAY   sodium fluoride (PREVIDENT 5000 PLUS) 1.1 % CREA dental cream Apply to tooth brush. Brush teeth for 2 minutes. Spit out excess-DO NOT swallow. Repeat nightly.   VITAMIN D PO Take by mouth.   vitamin E 400 UNIT capsule Take 400 IU daily x 1 week, then 400 IU BID   fentaNYL (DURAGESIC) 50 MCG/HR Place 1 patch onto the skin every 72 hours   [START ON 09/15/2021] fentaNYL (DURAGESIC) 50  MCG/HR Place 1 patch onto the skin every 3 (three) days.DNF until 09/15/21   [START ON 08/17/2021] fentaNYL (DURAGESIC) 50 MCG/HR Place 1 patch onto the skin every 3 (three) days.DNF until 08/17/21   nitroGLYCERIN (NITROSTAT) 0.4 MG SL tablet Place 1 tablet (0.4 mg total) under the tongue every 5 (five) minutes as needed.   oxyCODONE (ROXICODONE) 5 MG/5ML solution Take 10-15 mLs (10-15 mg total) by mouth every 4 (four) hours as needed for breakthrough cancer pain (DNF until 09-23-20)   oxyCODONE (ROXICODONE) 5 MG/5ML solution Take 10-15 mLs (10-15 mg total) by mouth every 4 (four) hours as needed  for breakthrough cancer pain (DNF until 08-24-20)   oxyCODONE (ROXICODONE) 5 MG/5ML solution Take 10-15 mLs by mouth every 4 (four) hours as needed  for breakthrough cancer pain   oxyCODONE (ROXICODONE) 5 MG/5ML solution Take 10-15 mLs (10-15 mg total) by mouth every 4 (four) hours as needed for breakthrough cancer pain   oxyCODONE (ROXICODONE) 5 MG/5ML solution Take 10-15 mLs (10-15 mg total) by mouth every 4 (four) hours as needed for breakthrough cancer pain   oxyCODONE (ROXICODONE) 5 MG/5ML solution Take 10-15 mLs (10-15 mg total) by mouth every 4 (four) hours as needed for breakthrough cancer pain.   oxyCODONE (ROXICODONE) 5 MG/5ML solution Take 10-15 mLs (10-15 mg total) by mouth every 4 (four) hours as needed for breakthrough cancer pain. (DNF until 02-22-21)   oxyCODONE (ROXICODONE) 5 MG/5ML solution Take 10-15 mLs (10-15 mg total) by mouth every 4 (four) hours as needed for breakthrough cancer pain. (DNF until 04-23-21)   oxyCODONE (ROXICODONE) 5 MG/5ML solution Take 10-15 mLs (10-15 mg total) by mouth every 4 (four) hours as needed for breakthrough cancer pain   oxyCODONE (ROXICODONE) 5 MG/5ML solution Take 10-15 mLs (10-15 mg total) by mouth every 4 (four) hours as needed for breakthrough cancer pain   [START ON 09/15/2021] oxyCODONE (ROXICODONE) 5 MG/5ML solution Take 10-15 mLs (10-15 mg total) by mouth  every 4 (four) hours as needed for breakthrough cancer pain (DNFB 09-15-21)   [START ON 08/17/2021] oxyCODONE (ROXICODONE) 5 MG/5ML solution Take 10-15 mLs (10-15 mg total) by mouth every 4 (four) hours as needed for breakthrough cancer pain (DNFB 08-17-21)   oxyCODONE (ROXICODONE) 5 MG/5ML solution Take 10-15 mLs (10-15 mg total) by mouth every 4 (four) hours as needed for breakthrough cancer pain (DNFB 07-18-21)   No facility-administered encounter medications on file as of 07/18/2021.    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    Hyperlipidemia    Hyperplastic colon polyp    Hypertension    Kidney stones    Rheumatoid arthritis ([place])    Shingles outbreak 04/23/15   left shoulder and left arm   Vertigo    Past Surgical History:  Procedure Laterality Date   CHOLECYSTECTOMY  2012   COLONOSCOPY W/ POLYPECTOMY  2015   CORONARY ARTERY BYPASS GRAFT  2005   6 grafts   CYSTOSCOPY     kidney stone removal   IR GASTROSTOMY TUBE REMOVAL  06/25/2016   LITHOTRIPSY     MULTIPLE EXTRACTIONS WITH ALVEOLOPLASTY N/A 11/14/2014   Procedure: Extraction of tooth #2 and 31 with alveoloplasty after sectioning of bridge at distal #29 with full mouth debridement of remaining dention;  Surgeon: Lenn Cal, DDS;  Location: [place];  Service: Oral Surgery;  Laterality: N/A;   NASAL SINUS SURGERY Right 11/14/2014   Procedure: RIGHT ENDOSCOPIC ANTROSTOMY AND ANTERIOR ETHMOIDECTOMY ;   Surgeon: Jodi Marble, MD;  [facility];  Service: ENT;  Laterality: Right;   Family History  Problem Relation Age of Onset   Heart attack Mother        deceased age 38 secondary   Colon cancer Father    Stroke Father        deceased age 60   Heart disease Father        heart attack   Stroke Sister 19   Coronary artery disease Brother 53       s/p bypass surgery /stent   CAD Sister        potentially   Lymphoma Sister    Social History   Socioeconomic History   Marital status: Married    Spouse name: Not on file   Number of children: Not on file   Years of education: Not on file   Highest education level: Not on file  Occupational History   Occupation: Retired   Tobacco Use   Smoking status: Never   Smokeless tobacco: Never  Vaping Use   Vaping Use: Never used  Substance and Sexual Activity   Alcohol use: No   Drug use: No   Sexual activity: Not on file  Other Topics Concern   Not on file  Social History Narrative   Married 35 years in October 2017.       Occupation :  Retired Arboriculturist for [employer].  Worked for [employer]: garden and work in yard   Social Determinants of Radio broadcast assistant Strain: Low Risk    Difficulty of Paying Living Expenses: Not hard at [employer] Insecurity: No Food Insecurity   Worried About Charity fundraiser in the Last Year: Never true   Arboriculturist in the Last Year: Never true  Transportation Needs: No Data processing manager (Medical): No   Lack of Transportation (Non-Medical): No  Physical Activity: Inactive   Days of Exercise per Week: 0 days   Minutes of Exercise per Session: 0 min  Stress: No Stress Concern Present   Feeling of Stress : Not at all  Social Connections: Moderately Isolated   Frequency of Communication  with Friends and Family: Never   Frequency of Social Gatherings with Friends and Family: Twice a week   Attends Religious Services: 1 to  4 times per year   Active Member of [employer] or Organizations: No   Attends Music therapist: Never   Marital Status: Married    Tobacco Counseling Counseling given: Not Answered   Clinical Intake:  Pre-visit preparation completed: Yes  Pain : No/denies pain     BMI - recorded: 23.59 Nutritional Status: BMI of 19-24  Normal Nutritional Risks: None Diabetes: Yes CBG done?: No Did pt. bring in CBG monitor from home?: No  How often do you need to have someone help you when you read instructions, pamphlets, or other written materials from your doctor or pharmacy?: 1 - Never  Diabetic?Nutrition Risk Assessment:  Has the patient had any N/V/D within the last 2 months?  Yes vomiting at times  Does the patient have any non-healing wounds?  No  Has the patient had any unintentional weight loss or weight gain?  No   Diabetes:  Is the patient diabetic?  Yes  If diabetic, was a CBG obtained today?  No  Did the patient bring in their glucometer from home?  No  How often do you monitor your CBG's? N/A.   Financial Strains and Diabetes Management:  Are you having any financial strains with the device, your supplies or your medication? No .  Does the patient want to be seen by Chronic Care Management for management of their diabetes?  No  Would the patient like to be referred to a Nutritionist or for Diabetic Management?  No   Diabetic Exams:  Diabetic Eye Exam: Overdue for diabetic eye exam. Pt has been advised about the importance in completing this exam. Patient advised to call and schedule an eye exam. Diabetic Foot Exam: Overdue, Pt has been advised about the importance in completing this exam. Pt is scheduled for diabetic foot exam on next appt .   Interpreter Needed?: No  Information entered by :: Charlott Rakes, LPN   Activities of Daily Living    07/18/2021    2:11 PM  In your present state of health, do you have any difficulty performing the following  activities:  Hearing? 1  Comment wears hearing aids  Vision? 0  Difficulty concentrating or making decisions? 0  Walking or climbing stairs? 1  Comment take time  Dressing or bathing? 0  Doing errands, shopping? 0  Preparing Food and eating ? N  Using the Toilet? N  In the past six months, have you accidently leaked urine? N  Do you have problems with loss of bowel control? N  Managing your Medications? N  Managing your Finances? N  Housekeeping or managing your Housekeeping? N    Patient Care Team: Marin Olp, MD as PCP - General (Family Medicine) Eppie Gibson, MD as Attending Physician (Radiation Oncology) Izora Gala, MD as Consulting Physician (Otolaryngology) Clydell Hakim, MD (Inactive) as Consulting Physician (Anesthesiology) Leroux-Martinez, Nancy Marus, AUD as Audiologist (Audiology)  Indicate any recent Medical Services you may have received from other than [facility] providers in the past year (date may be approximate).     Assessment:   This is a routine wellness examination for [place].  Hearing/Vision screen Hearing Screening - Comments:: Pt wears a hearing issues  Vision Screening - Comments:: Encouraged to follow up with provider   Dietary issues and exercise activities discussed: Current Exercise Habits: The patient does not participate in regular  exercise at present   Goals Addressed             This Visit's Progress    Patient Stated       None at this time        Depression Screen    07/18/2021    2:07 PM 06/30/2021    3:06 PM 03/28/2020   11:30 AM 07/10/2019    3:49 PM 07/10/2019    3:30 PM 11/09/2017    1:50 PM 07/02/2017    8:46 AM  PHQ 2/9 Scores  PHQ - 2 Score 0 0 0 0 0 1 0  PHQ- 9 Score  0 '3 3  4     '$ Fall Risk    07/18/2021    2:10 PM 06/30/2021    3:06 PM 07/10/2019    3:40 PM 07/10/2019    3:30 PM 07/02/2017    8:46 AM  Fall Risk   Falls in the past year? 0 0 0 0 No  Number falls in past yr: 0 0 0 0   Injury with Fall? 0 0 0  0   Risk for fall due to :  No Fall Risks     Follow up Falls prevention discussed Falls evaluation completed  Falls evaluation completed;Education provided;Falls prevention discussed     FALL RISK PREVENTION PERTAINING TO THE HOME:  Any stairs in or around the home? Yes  If so, are there any without handrails? No  Home free of loose throw rugs in walkways, pet beds, electrical cords, etc? Yes  Adequate lighting in your home to reduce risk of falls? Yes   ASSISTIVE DEVICES UTILIZED TO PREVENT FALLS:  Life alert? No  Use of a cane, walker or w/c? No  Grab bars in the bathroom? No  Shower chair or bench in shower? Yes  Elevated toilet seat or a handicapped toilet? No   TIMED UP AND GO:  Was the test performed? No .  Cognitive Function: declined pt is very [facility]         07/10/2019    3:30 PM  6CIT Screen  What Year? 0 points  What month? 0 points  What time? 0 points  Count back from 20 0 points  Months in reverse 0 points  Repeat phrase 0 points  Total Score 0 points    Immunizations Immunization History  Administered Date(s) Administered   Influenza Split 12/30/2010, 11/18/2011   Influenza Whole 12/22/2006, 12/27/2007, 11/05/2009   Influenza, High Dose Seasonal PF 12/26/2015   Influenza,inj,Quad PF,6+ Mos 12/06/2013, 12/17/2016   Influenza-Unspecified 11/01/2014   Pneumococcal Conjugate-13 10/10/2014   Pneumococcal Polysaccharide-23 01/24/2007, 10/24/2012   Td 01/24/2007    TDAP status: Due, Education has been provided regarding the importance of this vaccine. Advised may receive this vaccine at local pharmacy or Health Dept. Aware to provide a copy of the vaccination record if obtained from local pharmacy or Health Dept. Verbalized acceptance and understanding.  Flu Vaccine status: Declined, Education has been provided regarding the importance of this vaccine but patient still declined. Advised may receive this vaccine at local pharmacy or Health Dept. Aware to  provide a copy of the vaccination record if obtained from local pharmacy or Health Dept. Verbalized acceptance and understanding.  Pneumococcal vaccine status: Up to date  Covid-19 vaccine status: Declined, Education has been provided regarding the importance of this vaccine but patient still declined. Advised may receive this vaccine at local pharmacy or Health Dept.or vaccine clinic. Aware to provide a copy of  the vaccination record if obtained from local pharmacy or Health Dept. Verbalized acceptance and understanding.  Qualifies for Shingles Vaccine? Yes   Zostavax completed No   Shingrix Completed?: No.    Education has been provided regarding the importance of this vaccine. Patient has been advised to call insurance company to determine out of pocket expense if they have not yet received this vaccine. Advised may also receive vaccine at local pharmacy or Health Dept. Verbalized acceptance and understanding.  Screening Tests Health Maintenance  Topic Date Due   COVID-19 Vaccine (1) Never done   OPHTHALMOLOGY EXAM  Never done   Zoster Vaccines- Shingrix (1 of 2) Never done   TETANUS/TDAP  01/23/2017   FOOT EXAM  07/09/2020   INFLUENZA VACCINE  09/30/2021   HEMOGLOBIN A1C  12/31/2021   Pneumonia Vaccine 67+ Years old  Completed   Hepatitis C Screening  Completed   HPV VACCINES  Aged Out   COLONOSCOPY (Pts 45-29yr Insurance coverage will need to be confirmed)  Discontinued    Health Maintenance  Health Maintenance Due  Topic Date Due   COVID-19 Vaccine (1) Never done   OPHTHALMOLOGY EXAM  Never done   Zoster Vaccines- Shingrix (1 of 2) Never done   TETANUS/TDAP  01/23/2017   FOOT EXAM  07/09/2020    Colorectal cancer screening: No longer required.    Additional Screening:  Hepatitis C Screening:  Completed 09/09/15  Vision Screening: Recommended annual ophthalmology exams for early detection of glaucoma and other disorders of the eye. Is the patient up to date with  their annual eye exam?  No  Who is the provider or what is the name of the office in which the patient attends annual eye exams? Encouraged to follow up  If pt is not established with a provider, would they like to be referred to a provider to establish care? No .   Dental Screening: Recommended annual dental exams for proper oral hygiene  Community Resource Referral / Chronic Care Management: CRR required this visit?  No   CCM required this visit?  No      Plan:     I have personally reviewed and noted the following in the patient's chart:   Medical and social history Use of alcohol, tobacco or illicit drugs  Current medications and supplements including opioid prescriptions. Patient is currently taking opioid prescriptions. Information provided to patient regarding non-opioid alternatives. Patient advised to discuss non-opioid treatment plan with their provider. Functional ability and status Nutritional status Physical activity Advanced directives List of other physicians Hospitalizations, surgeries, and ER visits in previous 12 months Vitals Screenings to include cognitive, depression, and falls Referrals and appointments  In addition, I have reviewed and discussed with patient certain preventive protocols, quality metrics, and best practice recommendations. A written personalized care plan for preventive services as well as general preventive health recommendations were provided to patient.     TWillette Brace LPN   [id number]  Nurse Notes: None

## 2021-07-18 NOTE — Patient Instructions (Signed)
[name] , Thank you for taking time to come for your Medicare Wellness Visit. I appreciate your ongoing commitment to your health goals. Please review the following plan we discussed and let me know if I can assist you in the future.   Screening recommendations/referrals: Colonoscopy: no longer required  Recommended yearly ophthalmology/optometry visit for glaucoma screening and checkup Recommended yearly dental visit for hygiene and checkup  Vaccinations: Influenza vaccine: declined at this time  Pneumococcal vaccine: Up to date Tdap vaccine: due  Shingles vaccine: Shingrix discussed. Please contact your pharmacy for coverage information.    Covid-19: Declined   Advanced directives: Please bring a copy of your health care power of attorney and living will to the office at your convenience.  Conditions/risks identified: None at this time   Next appointment: Follow up in one year for your annual wellness visit.   Preventive Care 12 Years and Older, Male Preventive care refers to lifestyle choices and visits with your health care provider that can promote health and wellness. What does preventive care include? A yearly physical exam. This is also called an annual well check. Dental exams once or twice a year. Routine eye exams. Ask your health care provider how often you should have your eyes checked. Personal lifestyle choices, including: Daily care of your teeth and gums. Regular physical activity. Eating a healthy diet. Avoiding tobacco and drug use. Limiting alcohol use. Practicing safe sex. Taking low doses of aspirin every day. Taking vitamin and mineral supplements as recommended by your health care provider. What happens during an annual well check? The services and screenings done by your health care provider during your annual well check will depend on your age, overall health, lifestyle risk factors, and family history of disease. Counseling  Your health care  provider may ask you questions about your: Alcohol use. Tobacco use. Drug use. Emotional well-being. Home and relationship well-being. Sexual activity. Eating habits. History of falls. Memory and ability to understand (cognition). Work and work Statistician. Screening  You may have the following tests or measurements: Height, weight, and BMI. Blood pressure. Lipid and cholesterol levels. These may be checked every 5 years, or more frequently if you are over 56 years old. Skin check. Lung cancer screening. You may have this screening every year starting at age 38 if you have a 30-pack-year history of smoking and currently smoke or have quit within the past 15 years. Fecal occult blood test (FOBT) of the stool. You may have this test every year starting at age 53. Flexible sigmoidoscopy or colonoscopy. You may have a sigmoidoscopy every 5 years or a colonoscopy every 10 years starting at age 34. Prostate cancer screening. Recommendations will vary depending on your family history and other risks. Hepatitis C blood test. Hepatitis B blood test. Sexually transmitted disease (STD) testing. Diabetes screening. This is done by checking your blood sugar (glucose) after you have not eaten for a while (fasting). You may have this done every 1-3 years. Abdominal aortic aneurysm (AAA) screening. You may need this if you are a current or former smoker. Osteoporosis. You may be screened starting at age 59 if you are at high risk. Talk with your health care provider about your test results, treatment options, and if necessary, the need for more tests. Vaccines  Your health care provider may recommend certain vaccines, such as: Influenza vaccine. This is recommended every year. Tetanus, diphtheria, and acellular pertussis (Tdap, Td) vaccine. You may need a Td booster every 10 years. Zoster  vaccine. You may need this after age 85. Pneumococcal 13-valent conjugate (PCV13) vaccine. One dose is  recommended after age 42. Pneumococcal polysaccharide (PPSV23) vaccine. One dose is recommended after age 26. Talk to your health care provider about which screenings and vaccines you need and how often you need them. This information is not intended to replace advice given to you by your health care provider. Make sure you discuss any questions you have with your health care provider. Document Released: 03/15/2015 Document Revised: 11/06/2015 Document Reviewed: 12/18/2014 Elsevier Interactive Patient Education  2017 [place] Prevention in the Home Falls can cause injuries. They can happen to people of all ages. There are many things you can do to make your home safe and to help prevent falls. What can I do on the outside of my home? Regularly fix the edges of walkways and driveways and fix any cracks. Remove anything that might make you trip as you walk through a door, such as a raised step or threshold. Trim any bushes or trees on the path to your home. Use bright outdoor lighting. Clear any walking paths of anything that might make someone trip, such as rocks or tools. Regularly check to see if handrails are loose or broken. Make sure that both sides of any steps have handrails. Any raised decks and porches should have guardrails on the edges. Have any leaves, snow, or ice cleared regularly. Use sand or salt on walking paths during winter. Clean up any spills in your garage right away. This includes oil or grease spills. What can I do in the bathroom? Use night lights. Install grab bars by the toilet and in the tub and shower. Do not use towel bars as grab bars. Use non-skid mats or decals in the tub or shower. If you need to sit down in the shower, use a plastic, non-slip stool. Keep the floor dry. Clean up any water that spills on the floor as soon as it happens. Remove soap buildup in the tub or shower regularly. Attach bath mats securely with double-sided non-slip rug  tape. Do not have throw rugs and other things on the floor that can make you trip. What can I do in the bedroom? Use night lights. Make sure that you have a light by your bed that is easy to reach. Do not use any sheets or blankets that are too big for your bed. They should not hang down onto the floor. Have a firm chair that has side arms. You can use this for support while you get dressed. Do not have throw rugs and other things on the floor that can make you trip. What can I do in the kitchen? Clean up any spills right away. Avoid walking on wet floors. Keep items that you use a lot in easy-to-reach places. If you need to reach something above you, use a strong step stool that has a grab bar. Keep electrical cords out of the way. Do not use floor polish or wax that makes floors slippery. If you must use wax, use non-skid floor wax. Do not have throw rugs and other things on the floor that can make you trip. What can I do with my stairs? Do not leave any items on the stairs. Make sure that there are handrails on both sides of the stairs and use them. Fix handrails that are broken or loose. Make sure that handrails are as long as the stairways. Check any carpeting to make sure that it  is firmly attached to the stairs. Fix any carpet that is loose or worn. Avoid having throw rugs at the top or bottom of the stairs. If you do have throw rugs, attach them to the floor with carpet tape. Make sure that you have a light switch at the top of the stairs and the bottom of the stairs. If you do not have them, ask someone to add them for you. What else can I do to help prevent falls? Wear shoes that: Do not have high heels. Have rubber bottoms. Are comfortable and fit you well. Are closed at the toe. Do not wear sandals. If you use a stepladder: Make sure that it is fully opened. Do not climb a closed stepladder. Make sure that both sides of the stepladder are locked into place. Ask someone to  hold it for you, if possible. Clearly mark and make sure that you can see: Any grab bars or handrails. First and last steps. Where the edge of each step is. Use tools that help you move around (mobility aids) if they are needed. These include: Canes. Walkers. Scooters. Crutches. Turn on the lights when you go into a dark area. Replace any light bulbs as soon as they burn out. Set up your furniture so you have a clear path. Avoid moving your furniture around. If any of your floors are uneven, fix them. If there are any pets around you, be aware of where they are. Review your medicines with your doctor. Some medicines can make you feel dizzy. This can increase your chance of falling. Ask your doctor what other things that you can do to help prevent falls. This information is not intended to replace advice given to you by your health care provider. Make sure you discuss any questions you have with your health care provider. Document Released: 12/13/2008 Document Revised: 07/25/2015 Document Reviewed: 03/23/2014 Elsevier Interactive Patient Education  2017 [employer].

## 2021-08-08 ENCOUNTER — Telehealth (HOSPITAL_COMMUNITY): Payer: Self-pay | Admitting: *Deleted

## 2021-08-08 NOTE — Telephone Encounter (Signed)
Reaching out to patient to offer assistance regarding upcoming cardiac imaging study; pt's wife answered phone and verbalizes understanding of appt date/time, parking situation and where to check in, pre-test NPO status  and verified current allergies; name and call back number provided for further questions should they arise  Gordy Clement RN [facility] [phone] office [phone] cell  Patient's wife aware he is avoid caffeine 12 hours prior to his PET scan.

## 2021-08-12 ENCOUNTER — Encounter (HOSPITAL_COMMUNITY)
Admission: RE | Admit: 2021-08-12 | Discharge: 2021-08-12 | Disposition: A | Discharge status: Home / Self Care | Payer: Medicare HMO | Source: Ambulatory Visit | Attending: Cardiology | Admitting: Cardiology

## 2021-08-12 ENCOUNTER — Other Ambulatory Visit: Payer: Medicare HMO

## 2021-08-12 DIAGNOSIS — R0602 Shortness of breath: Secondary | ICD-10-CM

## 2021-08-12 DIAGNOSIS — E785 Hyperlipidemia, unspecified: Secondary | ICD-10-CM | POA: Diagnosis not present

## 2021-08-12 DIAGNOSIS — R011 Cardiac murmur, unspecified: Secondary | ICD-10-CM

## 2021-08-12 LAB — NM PET CT CARDIAC PERFUSION MULTI W/ABSOLUTE BLOODFLOW
LV dias vol: 141 mL (ref 62–150)
LV sys vol: 98 mL
Nuc Rest EF: 32 %
Nuc Stress EF: 30 %

## 2021-08-12 MED ORDER — RUBIDIUM RB82 GENERATOR (RUBYFILL)
15.5800 | PACK | Freq: Once | INTRAVENOUS | Status: AC
  Administered 2021-08-12: 15.58 via INTRAVENOUS

## 2021-08-12 MED ORDER — REGADENOSON 0.4 MG/5ML IV SOLN
INTRAVENOUS | Status: AC
  Administered 2021-08-12: 0.4 mg via INTRAVENOUS
  Filled 2021-08-12: qty 5

## 2021-08-12 MED ORDER — RUBIDIUM RB82 GENERATOR (RUBYFILL)
15.4800 | PACK | Freq: Once | INTRAVENOUS | Status: AC
  Administered 2021-08-12: 15.48 via INTRAVENOUS

## 2021-08-13 ENCOUNTER — Telehealth: Payer: Self-pay | Admitting: Cardiology

## 2021-08-13 NOTE — Telephone Encounter (Signed)
Called multiple times and got VM. Left message to call back about stress test results.

## 2021-08-13 NOTE — Telephone Encounter (Signed)
Went ahead and scheduled the pt to come into the office to see Richardson Dopp PA-C on next Tuesday 6/20 at 2:45 pm to arrange LHC, update H&P, EKG, pre-cath labs, and instructions at that time, for abnormal Cardiac PET Scan.  Dr. Johney Frame will endorse to the pt appt date and time when he returns a call back.

## 2021-08-14 ENCOUNTER — Other Ambulatory Visit (HOSPITAL_COMMUNITY): Payer: Self-pay

## 2021-08-14 ENCOUNTER — Other Ambulatory Visit: Payer: Self-pay | Admitting: Family Medicine

## 2021-08-14 ENCOUNTER — Telehealth: Payer: Self-pay | Admitting: Cardiology

## 2021-08-14 ENCOUNTER — Other Ambulatory Visit: Payer: Medicare HMO

## 2021-08-14 DIAGNOSIS — R0602 Shortness of breath: Secondary | ICD-10-CM | POA: Diagnosis not present

## 2021-08-14 DIAGNOSIS — E785 Hyperlipidemia, unspecified: Secondary | ICD-10-CM

## 2021-08-14 DIAGNOSIS — R011 Cardiac murmur, unspecified: Secondary | ICD-10-CM | POA: Diagnosis not present

## 2021-08-14 LAB — LIPID PANEL
Chol/HDL Ratio: 2.9 ratio (ref 0.0–5.0)
Cholesterol, Total: 126 mg/dL (ref 100–199)
HDL: 44 mg/dL (ref >39)
LDL Chol Calc (NIH): 65 mg/dL (ref 0–99)
Triglycerides: 90 mg/dL (ref 0–149)
VLDL Cholesterol Cal: 17 mg/dL (ref 5–40)

## 2021-08-14 NOTE — Telephone Encounter (Signed)
Patient's spouse returned call. Spoke with Karlene Einstein who advised Dr. Johney Frame will be calling the patient on her lunch today to discuss further. Advised pt's wife of this and the scheduled appt on 06/20 with Richardson Dopp. Wife verbalized understanding.

## 2021-08-14 NOTE — Telephone Encounter (Signed)
Kevin Bergeron, MD at 08/14/2021 11:09 AM  Status: Signed  Called and spoke to his wife about his PET results (patient is unable to hear on the phone). She will discuss with the patient further. 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 Will discuss further at visit with Richardson Dopp on 08/19/21.   Gwyndolyn Kaufman, MD

## 2021-08-14 NOTE — Telephone Encounter (Signed)
Called and spoke to his wife about his PET results (patient is unable to hear on the phone). She will discuss with the patient further. 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 Will discuss further at visit with Richardson Dopp on 08/19/21.  Gwyndolyn Kaufman, MD

## 2021-08-18 ENCOUNTER — Other Ambulatory Visit (HOSPITAL_COMMUNITY): Payer: Self-pay

## 2021-08-19 ENCOUNTER — Encounter: Payer: Self-pay | Admitting: Physician Assistant

## 2021-08-19 ENCOUNTER — Ambulatory Visit: Payer: Medicare HMO | Admitting: Physician Assistant

## 2021-08-19 VITALS — BP 132/72 | HR 59 | Ht 64.0 in | Wt 126.0 lb

## 2021-08-19 DIAGNOSIS — I10 Essential (primary) hypertension: Secondary | ICD-10-CM

## 2021-08-19 DIAGNOSIS — I5020 Unspecified systolic (congestive) heart failure: Secondary | ICD-10-CM | POA: Diagnosis not present

## 2021-08-19 DIAGNOSIS — I700 Atherosclerosis of aorta: Secondary | ICD-10-CM

## 2021-08-19 DIAGNOSIS — I7121 Aneurysm of the ascending aorta, without rupture: Secondary | ICD-10-CM

## 2021-08-19 DIAGNOSIS — I25119 Atherosclerotic heart disease of native coronary artery with unspecified angina pectoris: Secondary | ICD-10-CM

## 2021-08-19 DIAGNOSIS — I340 Nonrheumatic mitral (valve) insufficiency: Secondary | ICD-10-CM

## 2021-08-19 DIAGNOSIS — E785 Hyperlipidemia, unspecified: Secondary | ICD-10-CM

## 2021-08-19 HISTORY — DX: Unspecified systolic (congestive) heart failure: I50.20

## 2021-08-19 HISTORY — DX: Nonrheumatic mitral (valve) insufficiency: I34.0

## 2021-08-19 LAB — BASIC METABOLIC PANEL
BUN/Creatinine Ratio: 16 (ref 10–24)
BUN: 14 mg/dL (ref 8–27)
CO2: 28 mmol/L (ref 20–29)
Calcium: 9.1 mg/dL (ref 8.6–10.2)
Chloride: 102 mmol/L (ref 96–106)
Creatinine, Ser: 0.89 mg/dL (ref 0.76–1.27)
Glucose: 118 mg/dL — ABNORMAL HIGH (ref 70–99)
Potassium: 3.7 mmol/L (ref 3.5–5.2)
Sodium: 138 mmol/L (ref 134–144)
eGFR: 88 mL/min/{1.73_m2} (ref >59)

## 2021-08-19 LAB — CBC
Hematocrit: 34.8 % — ABNORMAL LOW (ref 37.5–51.0)
Hemoglobin: 11.8 g/dL — ABNORMAL LOW (ref 13.0–17.7)
MCH: 29 pg (ref 26.6–33.0)
MCHC: 33.9 g/dL (ref 31.5–35.7)
MCV: 86 fL (ref 79–97)
Platelets: 200 10*3/uL (ref 150–450)
RBC: 4.07 x10E6/uL — ABNORMAL LOW (ref 4.14–5.80)
RDW: 14.6 % (ref 11.6–15.4)
WBC: 6.4 10*3/uL (ref 3.4–10.8)

## 2021-08-19 NOTE — Patient Instructions (Addendum)
Medication Instructions:  Your physician recommends that you continue on your current medications as directed. Please refer to the Current Medication list given to you today.  *If you need a refill on your cardiac medications before your next appointment, please call your pharmacy*   Lab Work: TODAY:  BMET & CBC  If you have labs (blood work) drawn today and your tests are completely normal, you will receive your results only by: [place] (if you have MyChart) OR A paper copy in the mail If you have any lab test that is abnormal or we need to change your treatment, we will call you to review the results.   Testing/Procedures: Your physician has requested that you have a cardiac catheterization. Cardiac catheterization is used to diagnose and/or treat various heart conditions. Doctors may recommend this procedure for a number of different reasons. The most common reason is to evaluate chest pain. Chest pain can be a symptom of coronary artery disease (CAD), and cardiac catheterization can show whether plaque is narrowing or blocking your heart's arteries. This procedure is also used to evaluate the valves, as well as measure the blood flow and oxygen levels in different parts of your heart. For further information please visit [email or web address]. Please follow instruction sheet,BELOW:   [facility] OFFICE [place], [place] [address] Dept: [phone] Loc: [place]  08/19/2021  You are scheduled for a Cardiac Catheterization on Monday, July 3 with Dr. Sherren Mocha.  1. Please arrive at the Main Entrance A at [facility]: [place], [place] [address] at 7:30 AM (This time is two hours before your procedure to ensure your preparation). Free valet parking service is available.   Special note: Every effort is made to have your procedure done  on time. Please understand that emergencies sometimes delay scheduled procedures.  2. Diet: Do not eat solid foods after midnight.  You may have clear liquids until 5 AM upon the day of the procedure.  3. Labs: You will need to have blood drawn on TODAY   4. Medication instructions in preparation for your procedure:   Contrast Allergy: No  Do not take Diabetes Med Glucophage (Metformin) on the day of the procedure and HOLD 48 HOURS AFTER THE PROCEDURE.  On the morning of your procedure, take Aspirin and any morning medicines NOT listed above.  You may use sips of water.  5. Plan to go home the same day, you will only stay overnight if medically necessary. 6. You MUST have a responsible adult to drive you home. 7. An adult MUST be with you the first 24 hours after you arrive home. 8. Bring a current list of your medications, and the last time and date medication taken. 9. Bring ID and current insurance cards. 10.Please wear clothes that are easy to get on and off and wear slip-on shoes.  Thank you for allowing [place] to care for you!   -- [facility]   Follow-Up: At [facility], you and your health needs are our priority.  As part of our continuing mission to provide you with exceptional heart care, we have created designated Provider Care Teams.  These Care Teams include your primary Cardiologist (physician) and Advanced Practice Providers (APPs -  Physician Assistants and Nurse Practitioners) who all work together to provide you with the care you need, when you need it.  We recommend signing  up for the patient portal called "MyChart".  Sign up information is provided on this After Visit Summary.  MyChart is used to connect with patients for Virtual Visits (Telemedicine).  Patients are able to view lab/test results, encounter notes, upcoming appointments, etc.  Non-urgent messages can be sent to your provider as well.   To learn more about what you can do  with MyChart, go to [email or web address].    Your next appointment:   2 week(s) after Cath, 09/17/21 arrive at 9:05  The format for your next appointment:   In Person  Provider:   None     Other Instructions   Important Information About Sugar

## 2021-08-19 NOTE — Progress Notes (Signed)
Cardiology Office Note:    Date:  08/19/2021   ID:  Kevin Mcclain, DOB December 18, 1945, MRN [id number]  PCP:  Marin Olp, MD  [facility] Providers Cardiologist:  Freada Bergeron, MD     Referring MD: Marin Olp, MD   Chief Complaint:  F/u for CAD, CHF; arrange cardiac cath    Patient Profile: Coronary artery disease  Hx of MI x 3 in the past S/p CABG in 2005 ([facility]) Mitral regurgitation  Echo 06/2021: Moderate to severe MR HFmrEF (heart failure with mildly reduced ejection fraction)  Echo 06/2021: EF 45-50, inferior HK Ascending aortic aneurysm  CT 06/2021: 4.1 cm  Diabetes mellitus  Hypertension  Hyperlipidemia  Throat CA s/p XRT GERD Gout  Rheumatoid arthritis  Aortic atherosclerosis   Prior CV Studies: NM PET CT CARDIAC PERFUSION MULTI W/ABSOLUTE BLOODFLOW 08/12/2021   Findings are consistent with prior myocardial infarction with peri-infarct ischemia. The study is high risk due to presence of anterior infarct with peri-infarct ischemia, drop in EF with stress, and moderately reduced LVEF. Recommend cath for further evaluation.   LV perfusion is abnormal. There is evidence of infarction with peri-infarct ischemia. There is a medium defect with severe reduction in uptake present in the apical to basal anterior and apex location(s) that is partially reversible. There is abnormal wall motion in the defect area. Consistent with peri-infarct ischemia. Defect 2: There is a small defect with mild reduction in uptake present in the basal inferior location(s) that is partially reversible. There is abnormal wall motion in the defect area. Consistent with peri-infarct ischemia.   Rest left ventricular function is abnormal. Rest global function is moderately reduced. There were multiple regional abnormalities. Rest EF: 32 %. Stress left ventricular function is abnormal. Stress global function is moderately reduced. There were multiple regional abnormalities. Stress EF: 30  %. End diastolic cavity size is severely enlarged. TID is not present.   Myocardial blood flow reserve is not reported in this patient as patient is s/p CABG which affects diagnostic accuracy.   Patient is s/p CABG. There is severe native vessel coronary calcium present on the attenuation correction CT images. Coronary calcifications were present in the left anterior descending artery, left circumflex artery and right coronary artery distribution(s).  NONCARDIAC IMPRESSION: 1. Moderate right and small left pleural effusions, both slightly reduced compared 07/17/2021. 2.  Aortic Atherosclerosis (ICD10-I70.0). 3. Pneumobilia. 4. Thoracic spondylosis.  ECHO COMPLETE WO IMAGING ENHANCING AGENT 07/04/2021 EF 45-50, GRII DD, inferior AK, normal RVSF, moderately elevated PASP (RVSP 52.3), moderate-severe mitral regurgitation, mild-moderate AI   Chest CTA 07/17/2021 IMPRESSION: 1. No evidence of pulmonary embolism. 2. Large right and small left pleural effusions with associated atelectasis. Smooth septal line thickening and ground-glass opacities, right greater than left, are suggestive of pulmonary edema. 3. Pneumobilia is partially imaged. 4. Ascending aorta measuring 4.1 cm. Recommend follow-up every 12 months and vascular consultation. This recommendation follows ACR consensus guidelines: White Paper of the ACR Incidental Findings Committee II on Vascular Findings. J Am Coll Radiol 2013; 10:789-794. Aortic Atherosclerosis (ICD10-I70.0).  History of Present Illness:   Kevin Mcclain is a 76 y.o. male with the above problem list.  He was evaluated by Dr. Johney Frame in May 2023 for chest pain, shortness of breath and decreased exercise tolerance.  He was noted to have a loud systolic murmur on exam.  He was set up for Cardiac PET and Echocardiogram.  The echocardiogram demonstrated mildly reduced EF at 45-50 and mod to severe MR.  He is getting set up for a cardiac MRI.  His PCP did get a CTA  of the chest which ruled out pulmonary embolism but did show bilateral pleural effusions (R>>L).  His Cardiac PET was read as high risk due to anterior infarct with peri-infarct ischemia and drop in EF with stress.  He returns to arrange cardiac catheterization.    He is here today with his wife and daughter Clarene Reamer, NP).  We discussed the results of his echocardiogram and Myocardial PET scan.  He notes improved symptoms since he was last seen.  He remains short of breath with some activities and also has chest pain with over exertion.  He has not had syncope.  He did feel poorly after his PET scan.  He had N/V x 2 and did fall.  He has not had orthopnea, paroxysmal nocturnal dyspnea, leg edema.      Past Medical History:  Diagnosis Date   Allergy    Arthritis    Ascending aortic aneurysm ([place]) 03/25/2016   03/23/16 CTA chest Mildly aneurysmal ascending aorta at 4.0 cm.  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    Hyperlipidemia    Hyperplastic colon polyp    Hypertension    Kidney stones    Moderate to severe mitral regurgitation 08/19/2021   Echocardiogram 06/2021: EF 45-50, mod to severe MR   Rheumatoid arthritis ([place])    Shingles outbreak 04/23/15   left shoulder and left arm   Vertigo    Current Medications: Current Meds  Medication Sig    allopurinol (ZYLOPRIM) 100 MG tablet Take 1 tablet (100 mg total) by mouth daily.   Ascorbic Acid (VITAMIN C PO) Take by mouth.   aspirin 81 MG tablet Take 81 mg by mouth daily. Does not take everyday   atorvastatin (LIPITOR) 20 MG tablet Take 1 tablet (20 mg total) by mouth daily.   colchicine 0.6 MG tablet TAKE 2 TABLETS BY MOUTH AT 1ST SIGN OF GOUT FLARE, MAY REPEAT 1 TABLET IN 2HRS, THEN ONCE DAILY UNTIL SYMPTOMS RESOLVE   fentaNYL (DURAGESIC) 50 MCG/HR Place 1 patch onto the skin every 72 hours   [START ON 09/15/2021] fentaNYL (DURAGESIC) 50 MCG/HR Place 1 patch onto the skin every 3 (three) days.DNF until 09/15/21   fentaNYL (DURAGESIC) 50 MCG/HR Place 1 patch onto the skin every 3 (three) days.   fentaNYL (DURAGESIC) 50 MCG/HR Place 1 patch onto the skin every 3 (three) days.DNF until 07/18/21   levothyroxine (SYNTHROID) 100 MCG tablet Take 1 tablet (100 mcg total) by mouth daily before breakfast.   metFORMIN (GLUCOPHAGE) 500 MG tablet Take 1 tablet (500 mg total) by mouth daily with breakfast. TAKE 1 TABLET BY MOUTH DAILY WITH BREAKFAST.   metoprolol succinate (TOPROL-XL) 25 MG 24 hr tablet TAKE 1 TABLET (25  MG TOTAL) BY MOUTH DAILY. TAKE WITH OR IMMEDIATELY FOLLOWING A MEAL.   nortriptyline (PAMELOR) 10 MG capsule TAKE 1 CAPSULE BY MOUTH AT BEDTIME.   OVER THE COUNTER MEDICATION Place 1 drop into both eyes 3 (three) times daily as needed (dry eyes/ irritation). Clear Cooling Eye Drops   oxyCODONE (ROXICODONE) 5 MG/5ML solution Take 10 to 15 milliliter by mouth every 4 hours as needed for breakthrough cancer pain.   oxyCODONE (ROXICODONE) 5 MG/5ML solution Take 10-15 mLs (10-15 mg total) by mouth every 4 (four) hours as needed for breakthrough cancer pain (DNF until 09-23-20)   oxyCODONE (ROXICODONE) 5 MG/5ML solution Take 10-15 mLs (10-15 mg total) by mouth every 4 (four) hours as needed  for breakthrough cancer pain (DNF until 08-24-20)   oxyCODONE (ROXICODONE) 5 MG/5ML solution Take 10-15 mLs  by mouth every 4 (four) hours as needed  for breakthrough cancer pain   oxyCODONE (ROXICODONE) 5 MG/5ML solution Take 10-15 mLs (10-15 mg total) by mouth every 4 (four) hours as needed for breakthrough cancer pain   oxyCODONE (ROXICODONE) 5 MG/5ML solution Take 10-15 mLs (10-15 mg total) by mouth every 4 (four) hours as needed for breakthrough cancer pain   oxyCODONE (ROXICODONE) 5 MG/5ML solution Take 10-15 mLs (10-15 mg total) by mouth every 4 (four) hours as needed for breakthrough cancer pain.   oxyCODONE (ROXICODONE) 5 MG/5ML solution Take 10-15 mLs (10-15 mg total) by mouth every 4 (four) hours as needed for breakthrough cancer pain. (DNF until 02-22-21)   oxyCODONE (ROXICODONE) 5 MG/5ML solution Take 10-15 mLs (10-15 mg total) by mouth every 4 (four) hours as needed for breakthrough cancer pain. (DNF until 04-23-21)   oxyCODONE (ROXICODONE) 5 MG/5ML solution Take 10-15 mLs (10-15 mg total) by mouth every 4 (four) hours as needed for breakthrough cancer pain   oxyCODONE (ROXICODONE) 5 MG/5ML solution Take 10-15 mLs (10-15 mg total) by mouth every 4 (four) hours as needed for breakthrough cancer pain   [START ON 09/15/2021] oxyCODONE (ROXICODONE) 5 MG/5ML solution Take 10-15 mLs (10-15 mg total) by mouth every 4 (four) hours as needed for breakthrough cancer pain (DNFB 09-15-21)   oxyCODONE (ROXICODONE) 5 MG/5ML solution Take 10-15 mLs (10-15 mg total) by mouth every 4 (four) hours as needed for breakthrough cancer pain.   oxyCODONE (ROXICODONE) 5 MG/5ML solution Take 10-15 mLs (10-15 mg total) by mouth every 4 (four) hours as needed for breakthrough cancer pain (DNFB 07-18-21)   prochlorperazine (COMPAZINE) 10 MG tablet TAKE 1 TABLET BY MOUTH EVERY 6 HOURS AS NEEDED FOR NAUSEA OR VOMITING.   ramipril (ALTACE) 5 MG capsule TAKE 2 CAPSULES BY MOUTH EVERY MORNING   sertraline (ZOLOFT) 100 MG tablet TAKE 1 TABLET BY MOUTH EVERY DAY   sodium fluoride (PREVIDENT 5000 PLUS) 1.1 % CREA dental cream Apply to  tooth brush. Brush teeth for 2 minutes. Spit out excess-DO NOT swallow. Repeat nightly.   VITAMIN D PO Take by mouth.   vitamin E 400 UNIT capsule Take 400 IU daily x 1 week, then 400 IU BID    Allergies:   Benzonatate, Codeine, and Sitagliptin phosphate   Social History   Tobacco Use   Smoking status: Never   Smokeless tobacco: Never  Vaping Use   Vaping Use: Never used  Substance Use Topics   Alcohol use: No   Drug use: No    Family Hx: The patient's family history includes CAD in his sister; Colon cancer in his father; Coronary artery disease (age of onset: 22)  in his brother; Heart attack in his mother; Heart disease in his father; Lymphoma in his sister; Stroke in his father; Stroke (age of onset: 61) in his sister.  Review of Systems  Constitutional: Negative for fever.  Respiratory:  Negative for cough.   Gastrointestinal:  Negative for hematochezia.  Genitourinary:  Negative for hematuria.     EKGs/Labs/Other Test Reviewed:    EKG:  EKG is   ordered today.  The ekg ordered today demonstrates sinus brady, HR 59, normal axis, ant-lat Qs, flattening/<54m ST depression in V5-6, QTc 475 ms, similar to last EKG  Recent Labs: 06/30/2021: ALT 17; BUN 18; Creatinine, Ser 1.08; Hemoglobin 13.0; Platelets 374.0; Potassium 3.5; Sodium 138; TSH 8.93   Recent Lipid Panel Recent Labs    08/14/21 0853  CHOL 126  TRIG 90  HDL 44  LDLCALC 65     Risk Assessment/Calculations/Metrics:              Physical Exam:    VS:  BP 132/72   Pulse (!) 59   Ht '5\' 4"'$  (1.626 m)   Wt 126 lb (57.2 kg)   SpO2 95%   BMI 21.63 kg/m     Wt Readings from Last 3 Encounters:  08/19/21 126 lb (57.2 kg)  07/01/21 129 lb (58.5 kg)  06/30/21 128 lb 6.4 oz (58.2 kg)    Constitutional:      Appearance: Healthy appearance. Not in distress.  Neck:     Vascular: No JVR. JVD normal.  Pulmonary:     Effort: Pulmonary effort is normal.     Breath sounds: No wheezing. No rales.     Comments: R  base with slight decreased breaths sounds; o/w good air movement Cardiovascular:     Normal rate. Regular rhythm. Normal S1. Normal S2.      Murmurs: There is a grade 3/6 holosystolic murmur at the LLSB, radiating to the axilla.  Edema:    Peripheral edema absent.  Abdominal:     General: There is no distension.     Palpations: Abdomen is soft.  Skin:    General: Skin is warm and dry.  Neurological:     General: No focal deficit present.     Mental Status: Alert and oriented to person, place and time.          ASSESSMENT & PLAN:   CAD (coronary artery disease) Hx of prior MI and subsequent CABG in 2005 at [facility]  CABG was after complication during cardiac catheterization resulting in myocardial infarction.  Recent echocardiogram shows reduced LVF with EF 45-50 and inf HK.  His Myocardial PET is high risk and suggests LAD anterior infarct with peri-infarct ischemia.  His symptoms have improved somewhat since he was last seen.  But, he is still having exertional shortness of breath and exertional angina symptoms.  We discussed the need for cardiac catheterization.  He is somewhat hesitant given his previous complication.  However, he is willing to proceed.  He did have a radial graft from his L arm. We discussed the potential for him to have his cath from a femoral artery approach given his prior CABG hx.   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  Ischemic CM.  NYHA IIb.  Volume appears stable. He did  have bilateral pleural effusions on his recent CT.  There was evidence of this on his PET scan as well.  The appearance on his PET was improved.  He has had improved symptoms and his exam does not suggest volume overload today.  As we are planning on cardiac catheterization  in the next 2 weeks, I do not think we should change his CHF meds right now or start furosemide.   Continue Toprol XL 25 mg once daily, Ramipril 10 mg once daily After cardiac catheterization, consider maximizing GDMT [ie - change ACE to ARNI Delene Loll), add SLGT2i, add MRA]  Atherosclerosis of aorta (HCC) Continue ASA, statin  Moderate to severe mitral regurgitation Possibly ischemic MR based upon echocardiogram (post leaflet appears ischemically tethered).  CMR is pending.  Proceed with R and L cardiac catheterization as planned.   Ascending aortic aneurysm (HCC) 4.1 cm on recent CT.  Surveillance is managed by PCP.   Essential hypertension Fair control.  Continue current Rx.   Hyperlipidemia LDL goal <70 LDL optimal on most recent lab work.  Continue current Rx.          Shared Decision Making/Informed Consent The risks [stroke (1 in 1000), death (1 in 1000), kidney failure [usually temporary] (1 in 500), bleeding (1 in 200), allergic reaction [possibly serious] (1 in 200)], benefits (diagnostic support and management of coronary artery disease) and alternatives of a cardiac catheterization were discussed in detail with Mr. Kahl and he is willing to proceed.   Dispo:  Return for Post Procedure Follow Up, w/ Dr. Johney Frame.   Medication Adjustments/Labs and Tests Ordered: Current medicines are reviewed at length with the patient today.  Concerns regarding medicines are outlined above.  Tests Ordered: Orders Placed This Encounter  Procedures   Basic metabolic panel   CBC   EKG 12-Lead   Medication Changes: No orders of the defined types were placed in this encounter.  Signed, Richardson Dopp, PA-C  08/19/2021 4:31 PM    [facility] [place], [place], [place]  [address] Phone: [phone]; Fax: [phone]

## 2021-08-19 NOTE — Assessment & Plan Note (Signed)
Hx of prior MI and subsequent CABG in 2005 at [facility].  CABG was after complication during cardiac catheterization resulting in myocardial infarction.  Recent echocardiogram shows reduced LVF with EF 45-50 and inf HK.  His Myocardial PET is high risk and suggests LAD anterior infarct with peri-infarct ischemia.  His symptoms have improved somewhat since he was last seen.  But, he is still having exertional shortness of breath and exertional angina symptoms.  We discussed the need for cardiac catheterization.  He is somewhat hesitant given his previous complication.  However, he is willing to proceed.  He did have a radial graft from his L arm. We discussed the potential for him to have his cath from a femoral artery approach given his prior CABG hx.    Arrange R and L cardiac catheterization   BMET, CBC today  Continue ASA 81 mg once daily, Atorvastatin 20 mg once daily, Toprol XL 25 mg once daily, NTG prn  F/u post cardiac catheterization

## 2021-08-19 NOTE — Assessment & Plan Note (Signed)
Continue ASA, statin. ?

## 2021-08-19 NOTE — H&P (View-Only) (Signed)
Cardiology Office Note:    Date:  08/19/2021   ID:  Kevin Mcclain, DOB 09-30-1945, MRN [id number]  PCP:  Marin Olp, MD  [facility] Providers Cardiologist:  Freada Bergeron, MD     Referring MD: Marin Olp, MD   Chief Complaint:  F/u for CAD, CHF; arrange cardiac cath    Patient Profile: Coronary artery disease  Hx of MI x 3 in the past S/p CABG in 2005 ([facility]) Mitral regurgitation  Echo 06/2021: Moderate to severe MR HFmrEF (heart failure with mildly reduced ejection fraction)  Echo 06/2021: EF 45-50, inferior HK Ascending aortic aneurysm  CT 06/2021: 4.1 cm  Diabetes mellitus  Hypertension  Hyperlipidemia  Throat CA s/p XRT GERD Gout  Rheumatoid arthritis  Aortic atherosclerosis   Prior CV Studies: NM PET CT CARDIAC PERFUSION MULTI W/ABSOLUTE BLOODFLOW 08/12/2021   Findings are consistent with prior myocardial infarction with peri-infarct ischemia. The study is high risk due to presence of anterior infarct with peri-infarct ischemia, drop in EF with stress, and moderately reduced LVEF. Recommend cath for further evaluation.   LV perfusion is abnormal. There is evidence of infarction with peri-infarct ischemia. There is a medium defect with severe reduction in uptake present in the apical to basal anterior and apex location(s) that is partially reversible. There is abnormal wall motion in the defect area. Consistent with peri-infarct ischemia. Defect 2: There is a small defect with mild reduction in uptake present in the basal inferior location(s) that is partially reversible. There is abnormal wall motion in the defect area. Consistent with peri-infarct ischemia.   Rest left ventricular function is abnormal. Rest global function is moderately reduced. There were multiple regional abnormalities. Rest EF: 32 %. Stress left ventricular function is abnormal. Stress global function is moderately reduced. There were multiple regional abnormalities. Stress EF: 30  %. End diastolic cavity size is severely enlarged. TID is not present.   Myocardial blood flow reserve is not reported in this patient as patient is s/p CABG which affects diagnostic accuracy.   Patient is s/p CABG. There is severe native vessel coronary calcium present on the attenuation correction CT images. Coronary calcifications were present in the left anterior descending artery, left circumflex artery and right coronary artery distribution(s).  NONCARDIAC IMPRESSION: 1. Moderate right and small left pleural effusions, both slightly reduced compared 07/17/2021. 2.  Aortic Atherosclerosis (ICD10-I70.0). 3. Pneumobilia. 4. Thoracic spondylosis.  ECHO COMPLETE WO IMAGING ENHANCING AGENT 07/04/2021 EF 45-50, GRII DD, inferior AK, normal RVSF, moderately elevated PASP (RVSP 52.3), moderate-severe mitral regurgitation, mild-moderate AI   Chest CTA 07/17/2021 IMPRESSION: 1. No evidence of pulmonary embolism. 2. Large right and small left pleural effusions with associated atelectasis. Smooth septal line thickening and ground-glass opacities, right greater than left, are suggestive of pulmonary edema. 3. Pneumobilia is partially imaged. 4. Ascending aorta measuring 4.1 cm. Recommend follow-up every 12 months and vascular consultation. This recommendation follows ACR consensus guidelines: White Paper of the ACR Incidental Findings Committee II on Vascular Findings. J Am Coll Radiol 2013; 10:789-794. Aortic Atherosclerosis (ICD10-I70.0).  History of Present Illness:   Kevin Mcclain is a 76 y.o. male with the above problem list.  He was evaluated by Dr. Johney Frame in May 2023 for chest pain, shortness of breath and decreased exercise tolerance.  He was noted to have a loud systolic murmur on exam.  He was set up for Cardiac PET and Echocardiogram.  The echocardiogram demonstrated mildly reduced EF at 45-50 and mod to severe MR.  He is getting set up for a cardiac MRI.  His PCP did get a CTA  of the chest which ruled out pulmonary embolism but did show bilateral pleural effusions (R>>L).  His Cardiac PET was read as high risk due to anterior infarct with peri-infarct ischemia and drop in EF with stress.  He returns to arrange cardiac catheterization.    He is here today with his wife and daughter Kevin Reamer, NP).  We discussed the results of his echocardiogram and Myocardial PET scan.  He notes improved symptoms since he was last seen.  He remains short of breath with some activities and also has chest pain with over exertion.  He has not had syncope.  He did feel poorly after his PET scan.  He had N/V x 2 and did fall.  He has not had orthopnea, paroxysmal nocturnal dyspnea, leg edema.      Past Medical History:  Diagnosis Date   Allergy    Arthritis    Ascending aortic aneurysm ([place]) 03/25/2016   03/23/16 CTA chest Mildly aneurysmal ascending aorta at 4.0 cm.  07/17/2021 CTA: 4.1 cm    CAD (coronary artery disease)    severe CAD-s/p MI 2000 with PTCA, s/p CABG 2005-by Dr Vanetta Mulders at [facility] EF 50-55%   Cancer 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    Hyperlipidemia    Hyperplastic colon polyp    Hypertension    Kidney stones    Moderate to severe mitral regurgitation 08/19/2021   Echocardiogram 06/2021: EF 45-50, mod to severe MR   Rheumatoid arthritis ([place])    Shingles outbreak 04/23/15   left shoulder and left arm   Vertigo    Current Medications: Current Meds  Medication Sig    allopurinol (ZYLOPRIM) 100 MG tablet Take 1 tablet (100 mg total) by mouth daily.   Ascorbic Acid (VITAMIN C PO) Take by mouth.   aspirin 81 MG tablet Take 81 mg by mouth daily. Does not take everyday   atorvastatin (LIPITOR) 20 MG tablet Take 1 tablet (20 mg total) by mouth daily.   colchicine 0.6 MG tablet TAKE 2 TABLETS BY MOUTH AT 1ST SIGN OF GOUT FLARE, MAY REPEAT 1 TABLET IN 2HRS, THEN ONCE DAILY UNTIL SYMPTOMS RESOLVE   fentaNYL (DURAGESIC) 50 MCG/HR Place 1 patch onto the skin every 72 hours   [START ON 09/15/2021] fentaNYL (DURAGESIC) 50 MCG/HR Place 1 patch onto the skin every 3 (three) days.DNF until 09/15/21   fentaNYL (DURAGESIC) 50 MCG/HR Place 1 patch onto the skin every 3 (three) days.   fentaNYL (DURAGESIC) 50 MCG/HR Place 1 patch onto the skin every 3 (three) days.DNF until 07/18/21   levothyroxine (SYNTHROID) 100 MCG tablet Take 1 tablet (100 mcg total) by mouth daily before breakfast.   metFORMIN (GLUCOPHAGE) 500 MG tablet Take 1 tablet (500 mg total) by mouth daily with breakfast. TAKE 1 TABLET BY MOUTH DAILY WITH BREAKFAST.   metoprolol succinate (TOPROL-XL) 25 MG 24 hr tablet TAKE 1 TABLET (25  MG TOTAL) BY MOUTH DAILY. TAKE WITH OR IMMEDIATELY FOLLOWING A MEAL.   nortriptyline (PAMELOR) 10 MG capsule TAKE 1 CAPSULE BY MOUTH AT BEDTIME.   OVER THE COUNTER MEDICATION Place 1 drop into both eyes 3 (three) times daily as needed (dry eyes/ irritation). Clear Cooling Eye Drops   oxyCODONE (ROXICODONE) 5 MG/5ML solution Take 10 to 15 milliliter by mouth every 4 hours as needed for breakthrough cancer pain.   oxyCODONE (ROXICODONE) 5 MG/5ML solution Take 10-15 mLs (10-15 mg total) by mouth every 4 (four) hours as needed for breakthrough cancer pain (DNF until 09-23-20)   oxyCODONE (ROXICODONE) 5 MG/5ML solution Take 10-15 mLs (10-15 mg total) by mouth every 4 (four) hours as needed  for breakthrough cancer pain (DNF until 08-24-20)   oxyCODONE (ROXICODONE) 5 MG/5ML solution Take 10-15 mLs  by mouth every 4 (four) hours as needed  for breakthrough cancer pain   oxyCODONE (ROXICODONE) 5 MG/5ML solution Take 10-15 mLs (10-15 mg total) by mouth every 4 (four) hours as needed for breakthrough cancer pain   oxyCODONE (ROXICODONE) 5 MG/5ML solution Take 10-15 mLs (10-15 mg total) by mouth every 4 (four) hours as needed for breakthrough cancer pain   oxyCODONE (ROXICODONE) 5 MG/5ML solution Take 10-15 mLs (10-15 mg total) by mouth every 4 (four) hours as needed for breakthrough cancer pain.   oxyCODONE (ROXICODONE) 5 MG/5ML solution Take 10-15 mLs (10-15 mg total) by mouth every 4 (four) hours as needed for breakthrough cancer pain. (DNF until 02-22-21)   oxyCODONE (ROXICODONE) 5 MG/5ML solution Take 10-15 mLs (10-15 mg total) by mouth every 4 (four) hours as needed for breakthrough cancer pain. (DNF until 04-23-21)   oxyCODONE (ROXICODONE) 5 MG/5ML solution Take 10-15 mLs (10-15 mg total) by mouth every 4 (four) hours as needed for breakthrough cancer pain   oxyCODONE (ROXICODONE) 5 MG/5ML solution Take 10-15 mLs (10-15 mg total) by mouth every 4 (four) hours as needed for breakthrough cancer pain   [START ON 09/15/2021] oxyCODONE (ROXICODONE) 5 MG/5ML solution Take 10-15 mLs (10-15 mg total) by mouth every 4 (four) hours as needed for breakthrough cancer pain (DNFB 09-15-21)   oxyCODONE (ROXICODONE) 5 MG/5ML solution Take 10-15 mLs (10-15 mg total) by mouth every 4 (four) hours as needed for breakthrough cancer pain.   oxyCODONE (ROXICODONE) 5 MG/5ML solution Take 10-15 mLs (10-15 mg total) by mouth every 4 (four) hours as needed for breakthrough cancer pain (DNFB 07-18-21)   prochlorperazine (COMPAZINE) 10 MG tablet TAKE 1 TABLET BY MOUTH EVERY 6 HOURS AS NEEDED FOR NAUSEA OR VOMITING.   ramipril (ALTACE) 5 MG capsule TAKE 2 CAPSULES BY MOUTH EVERY MORNING   sertraline (ZOLOFT) 100 MG tablet TAKE 1 TABLET BY MOUTH EVERY DAY   sodium fluoride (PREVIDENT 5000 PLUS) 1.1 % CREA dental cream Apply to  tooth brush. Brush teeth for 2 minutes. Spit out excess-DO NOT swallow. Repeat nightly.   VITAMIN D PO Take by mouth.   vitamin E 400 UNIT capsule Take 400 IU daily x 1 week, then 400 IU BID    Allergies:   Benzonatate, Codeine, and Sitagliptin phosphate   Social History   Tobacco Use   Smoking status: Never   Smokeless tobacco: Never  Vaping Use   Vaping Use: Never used  Substance Use Topics   Alcohol use: No   Drug use: No    Family Hx: The patient's family history includes CAD in his sister; Colon cancer in his father; Coronary artery disease (age of onset: 80)  in his brother; Heart attack in his mother; Heart disease in his father; Lymphoma in his sister; Stroke in his father; Stroke (age of onset: 62) in his sister.  Review of Systems  Constitutional: Negative for fever.  Respiratory:  Negative for cough.   Gastrointestinal:  Negative for hematochezia.  Genitourinary:  Negative for hematuria.     EKGs/Labs/Other Test Reviewed:    EKG:  EKG is   ordered today.  The ekg ordered today demonstrates sinus brady, HR 59, normal axis, ant-lat Qs, flattening/<92m ST depression in V5-6, QTc 475 ms, similar to last EKG  Recent Labs: 06/30/2021: ALT 17; BUN 18; Creatinine, Ser 1.08; Hemoglobin 13.0; Platelets 374.0; Potassium 3.5; Sodium 138; TSH 8.93   Recent Lipid Panel Recent Labs    08/14/21 0853  CHOL 126  TRIG 90  HDL 44  LDLCALC 65     Risk Assessment/Calculations/Metrics:              Physical Exam:    VS:  BP 132/72   Pulse (!) 59   Ht '5\' 4"'$  (1.626 m)   Wt 126 lb (57.2 kg)   SpO2 95%   BMI 21.63 kg/m     Wt Readings from Last 3 Encounters:  08/19/21 126 lb (57.2 kg)  07/01/21 129 lb (58.5 kg)  06/30/21 128 lb 6.4 oz (58.2 kg)    Constitutional:      Appearance: Healthy appearance. Not in distress.  Neck:     Vascular: No JVR. JVD normal.  Pulmonary:     Effort: Pulmonary effort is normal.     Breath sounds: No wheezing. No rales.     Comments: R  base with slight decreased breaths sounds; o/w good air movement Cardiovascular:     Normal rate. Regular rhythm. Normal S1. Normal S2.      Murmurs: There is a grade 3/6 holosystolic murmur at the LLSB, radiating to the axilla.  Edema:    Peripheral edema absent.  Abdominal:     General: There is no distension.     Palpations: Abdomen is soft.  Skin:    General: Skin is warm and dry.  Neurological:     General: No focal deficit present.     Mental Status: Alert and oriented to person, place and time.          ASSESSMENT & PLAN:   CAD (coronary artery disease) Hx of prior MI and subsequent CABG in 2005 at [facility]  CABG was after complication during cardiac catheterization resulting in myocardial infarction.  Recent echocardiogram shows reduced LVF with EF 45-50 and inf HK.  His Myocardial PET is high risk and suggests LAD anterior infarct with peri-infarct ischemia.  His symptoms have improved somewhat since he was last seen.  But, he is still having exertional shortness of breath and exertional angina symptoms.  We discussed the need for cardiac catheterization.  He is somewhat hesitant given his previous complication.  However, he is willing to proceed.  He did have a radial graft from his L arm. We discussed the potential for him to have his cath from a femoral artery approach given his prior CABG hx.   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  Ischemic CM.  NYHA IIb.  Volume appears stable. He did  have bilateral pleural effusions on his recent CT.  There was evidence of this on his PET scan as well.  The appearance on his PET was improved.  He has had improved symptoms and his exam does not suggest volume overload today.  As we are planning on cardiac catheterization  in the next 2 weeks, I do not think we should change his CHF meds right now or start furosemide.   Continue Toprol XL 25 mg once daily, Ramipril 10 mg once daily After cardiac catheterization, consider maximizing GDMT [ie - change ACE to ARNI Delene Loll), add SLGT2i, add MRA]  Atherosclerosis of aorta (HCC) Continue ASA, statin  Moderate to severe mitral regurgitation Possibly ischemic MR based upon echocardiogram (post leaflet appears ischemically tethered).  CMR is pending.  Proceed with R and L cardiac catheterization as planned.   Ascending aortic aneurysm (HCC) 4.1 cm on recent CT.  Surveillance is managed by PCP.   Essential hypertension Fair control.  Continue current Rx.   Hyperlipidemia LDL goal <70 LDL optimal on most recent lab work.  Continue current Rx.          Shared Decision Making/Informed Consent The risks [stroke (1 in 1000), death (1 in 1000), kidney failure [usually temporary] (1 in 500), bleeding (1 in 200), allergic reaction [possibly serious] (1 in 200)], benefits (diagnostic support and management of coronary artery disease) and alternatives of a cardiac catheterization were discussed in detail with Mr. Walkowski and he is willing to proceed.   Dispo:  Return for Post Procedure Follow Up, w/ Dr. Johney Frame.   Medication Adjustments/Labs and Tests Ordered: Current medicines are reviewed at length with the patient today.  Concerns regarding medicines are outlined above.  Tests Ordered: Orders Placed This Encounter  Procedures   Basic metabolic panel   CBC   EKG 12-Lead   Medication Changes: No orders of the defined types were placed in this encounter.  Signed, Richardson Dopp, PA-C  08/19/2021 4:31 PM    [facility] [place], [place], [place]  [address] Phone: [phone]; Fax: [phone]

## 2021-08-19 NOTE — Assessment & Plan Note (Signed)
LDL optimal on most recent lab work.  Continue current Rx.   

## 2021-08-19 NOTE — Assessment & Plan Note (Signed)
Fair control.  Continue current Rx.

## 2021-08-19 NOTE — Assessment & Plan Note (Signed)
4.1 cm on recent CT.  Surveillance is managed by PCP.

## 2021-08-19 NOTE — Assessment & Plan Note (Signed)
EF 45-50.  Ischemic CM.  NYHA IIb.  Volume appears stable. He did have bilateral pleural effusions on his recent CT.  There was evidence of this on his PET scan as well.  The appearance on his PET was improved.  He has had improved symptoms and his exam does not suggest volume overload today.  As we are planning on cardiac catheterization in the next 2 weeks, I do not think we should change his CHF meds right now or start furosemide.    Continue Toprol XL 25 mg once daily, Ramipril 10 mg once daily  After cardiac catheterization, consider maximizing GDMT [ie - change ACE to ARNI Delene Loll), add SLGT2i, add MRA]

## 2021-08-19 NOTE — Assessment & Plan Note (Signed)
Possibly ischemic MR based upon echocardiogram (post leaflet appears ischemically tethered).  CMR is pending.  Proceed with R and L cardiac catheterization as planned.

## 2021-08-21 ENCOUNTER — Telehealth (HOSPITAL_COMMUNITY): Payer: Self-pay | Admitting: *Deleted

## 2021-08-21 NOTE — Telephone Encounter (Signed)
Attempted to call patient regarding upcoming cardiac MRI appointment. Left message on voicemail with name and callback number  Coron Rossano RN Navigator Cardiac Imaging [facility] [phone] Office [phone] Cell  

## 2021-08-22 ENCOUNTER — Ambulatory Visit (HOSPITAL_COMMUNITY): Admission: RE | Admit: 2021-08-22 | Payer: Medicare HMO | Source: Ambulatory Visit

## 2021-08-25 ENCOUNTER — Telehealth: Payer: Self-pay | Admitting: Cardiology

## 2021-08-25 ENCOUNTER — Telehealth: Payer: Self-pay | Admitting: *Deleted

## 2021-08-25 DIAGNOSIS — Z79899 Other long term (current) drug therapy: Secondary | ICD-10-CM

## 2021-08-27 ENCOUNTER — Other Ambulatory Visit: Payer: Medicare HMO

## 2021-08-27 ENCOUNTER — Telehealth: Payer: Self-pay | Admitting: Physician Assistant

## 2021-08-27 DIAGNOSIS — Z79899 Other long term (current) drug therapy: Secondary | ICD-10-CM | POA: Diagnosis not present

## 2021-08-27 LAB — HEMOGLOBIN AND HEMATOCRIT, BLOOD
Hematocrit: 36 % — ABNORMAL LOW (ref 37.5–51.0)
Hemoglobin: 12.3 g/dL — ABNORMAL LOW (ref 13.0–17.7)

## 2021-08-27 NOTE — Telephone Encounter (Signed)
Follow Up:     Patient's wife returning call, concerning his lab results.

## 2021-08-27 NOTE — Telephone Encounter (Signed)
Returned call to pt / pt's wife, they have been made aware of pt's lab results, see result note.

## 2021-08-27 NOTE — Progress Notes (Signed)
Pt has been made aware of normal result and verbalized understanding.  jw

## 2021-08-27 NOTE — Telephone Encounter (Signed)
-----   Message from Liliane Shi, [place] sent at 08/27/2021 12:53 PM EDT ----- Hgb improved/stable.  PLAN:  - Continue current medications/treatment plan and follow up as scheduled.  Richardson Dopp, PA-C    08/27/2021 12:53 PM

## 2021-08-28 ENCOUNTER — Telehealth: Payer: Self-pay | Admitting: *Deleted

## 2021-08-28 NOTE — Telephone Encounter (Signed)
Cardiac Catheterization scheduled at [facility] for: Monday September 01, 2021 9:30 AM Arrival time and place: [place] Entrance A at: 7:30 AM   Nothing to eat after midnight prior to procedure, clear liquids until 5 AM day of procedure.  Medication instructions: -Hold:  Metformin-day of procedure and 48 hours procedure  -Except hold medications usual morning medications can be taken with sips of water including aspirin 81 mg.  Confirmed patient has responsible adult to drive home post procedure and be with patient first 24 hours after arriving home.  Patient reports no new symptoms concerning for COVID-19 in the past 10 days.  Reviewed procedure instructions with patient.

## 2021-09-01 ENCOUNTER — Encounter (HOSPITAL_COMMUNITY)
Admission: RE | Disposition: A | Discharge status: Home / Self Care | Payer: Self-pay | Source: Home / Self Care | Attending: Cardiovascular Disease

## 2021-09-01 ENCOUNTER — Ambulatory Visit (HOSPITAL_COMMUNITY)
Admission: RE | Admit: 2021-09-01 | Discharge: 2021-09-01 | Disposition: A | Discharge status: Home / Self Care | Payer: Medicare HMO | Attending: Cardiovascular Disease | Admitting: Cardiovascular Disease

## 2021-09-01 ENCOUNTER — Encounter (HOSPITAL_COMMUNITY): Payer: Self-pay | Admitting: Cardiovascular Disease

## 2021-09-01 ENCOUNTER — Other Ambulatory Visit: Payer: Self-pay

## 2021-09-01 DIAGNOSIS — Z79899 Other long term (current) drug therapy: Secondary | ICD-10-CM | POA: Diagnosis not present

## 2021-09-01 DIAGNOSIS — E785 Hyperlipidemia, unspecified: Secondary | ICD-10-CM | POA: Diagnosis not present

## 2021-09-01 DIAGNOSIS — I5020 Unspecified systolic (congestive) heart failure: Secondary | ICD-10-CM

## 2021-09-01 DIAGNOSIS — Z951 Presence of aortocoronary bypass graft: Secondary | ICD-10-CM | POA: Insufficient documentation

## 2021-09-01 DIAGNOSIS — I340 Nonrheumatic mitral (valve) insufficiency: Secondary | ICD-10-CM | POA: Diagnosis not present

## 2021-09-01 DIAGNOSIS — I272 Pulmonary hypertension, unspecified: Secondary | ICD-10-CM | POA: Diagnosis not present

## 2021-09-01 DIAGNOSIS — I700 Atherosclerosis of aorta: Secondary | ICD-10-CM | POA: Diagnosis not present

## 2021-09-01 DIAGNOSIS — I255 Ischemic cardiomyopathy: Secondary | ICD-10-CM | POA: Insufficient documentation

## 2021-09-01 DIAGNOSIS — I5022 Chronic systolic (congestive) heart failure: Secondary | ICD-10-CM | POA: Insufficient documentation

## 2021-09-01 DIAGNOSIS — I25810 Atherosclerosis of coronary artery bypass graft(s) without angina pectoris: Secondary | ICD-10-CM

## 2021-09-01 DIAGNOSIS — Z7982 Long term (current) use of aspirin: Secondary | ICD-10-CM | POA: Diagnosis not present

## 2021-09-01 DIAGNOSIS — I2582 Chronic total occlusion of coronary artery: Secondary | ICD-10-CM | POA: Insufficient documentation

## 2021-09-01 DIAGNOSIS — I2510 Atherosclerotic heart disease of native coronary artery without angina pectoris: Secondary | ICD-10-CM

## 2021-09-01 DIAGNOSIS — I110 Hypertensive heart disease with heart failure: Secondary | ICD-10-CM | POA: Insufficient documentation

## 2021-09-01 DIAGNOSIS — R0602 Shortness of breath: Secondary | ICD-10-CM | POA: Insufficient documentation

## 2021-09-01 DIAGNOSIS — I25119 Atherosclerotic heart disease of native coronary artery with unspecified angina pectoris: Secondary | ICD-10-CM

## 2021-09-01 DIAGNOSIS — I7121 Aneurysm of the ascending aorta, without rupture: Secondary | ICD-10-CM | POA: Insufficient documentation

## 2021-09-01 DIAGNOSIS — R9439 Abnormal result of other cardiovascular function study: Secondary | ICD-10-CM | POA: Diagnosis not present

## 2021-09-01 DIAGNOSIS — I252 Old myocardial infarction: Secondary | ICD-10-CM | POA: Diagnosis not present

## 2021-09-01 HISTORY — PX: RIGHT/LEFT HEART CATH AND CORONARY/GRAFT ANGIOGRAPHY: CATH118267

## 2021-09-01 LAB — POCT I-STAT 7, (LYTES, BLD GAS, ICA,H+H)
Acid-base deficit: 2 mmol/L (ref 0.0–2.0)
Bicarbonate: 21.8 mmol/L (ref 20.0–28.0)
Calcium, Ion: 1.22 mmol/L (ref 1.15–1.40)
HCT: 33 % — ABNORMAL LOW (ref 39.0–52.0)
Hemoglobin: 11.2 g/dL — ABNORMAL LOW (ref 13.0–17.0)
O2 Saturation: 94 %
Potassium: 3.5 mmol/L (ref 3.5–5.1)
Sodium: 141 mmol/L (ref 135–145)
TCO2: 23 mmol/L (ref 22–32)
pCO2 arterial: 34.9 mmHg (ref 32–48)
pH, Arterial: 7.403 (ref 7.35–7.45)
pO2, Arterial: 69 mmHg — ABNORMAL LOW (ref 83–108)

## 2021-09-01 LAB — POCT I-STAT EG7
Acid-base deficit: 2 mmol/L (ref 0.0–2.0)
Bicarbonate: 24.2 mmol/L (ref 20.0–28.0)
Calcium, Ion: 1.26 mmol/L (ref 1.15–1.40)
HCT: 35 % — ABNORMAL LOW (ref 39.0–52.0)
Hemoglobin: 11.9 g/dL — ABNORMAL LOW (ref 13.0–17.0)
O2 Saturation: 58 %
Potassium: 3.5 mmol/L (ref 3.5–5.1)
Sodium: 141 mmol/L (ref 135–145)
TCO2: 25 mmol/L (ref 22–32)
pCO2, Ven: 44.2 mmHg (ref 44–60)
pH, Ven: 7.346 (ref 7.25–7.43)
pO2, Ven: 32 mmHg (ref 32–45)

## 2021-09-01 LAB — GLUCOSE, CAPILLARY
Glucose-Capillary: 114 mg/dL — ABNORMAL HIGH (ref 70–99)
Glucose-Capillary: 84 mg/dL (ref 70–99)

## 2021-09-01 SURGERY — RIGHT/LEFT HEART CATH AND CORONARY/GRAFT ANGIOGRAPHY
Anesthesia: LOCAL

## 2021-09-01 MED ORDER — SODIUM CHLORIDE 0.9 % WEIGHT BASED INFUSION
3.0000 mL/kg/h | INTRAVENOUS | Status: AC
  Administered 2021-09-01: 3 mL/kg/h via INTRAVENOUS

## 2021-09-01 MED ORDER — LIDOCAINE HCL (PF) 1 % IJ SOLN
INTRAMUSCULAR | Status: AC
  Filled 2021-09-01: qty 30

## 2021-09-01 MED ORDER — SODIUM CHLORIDE 0.9 % IV SOLN: INTRAVENOUS | Status: DC

## 2021-09-01 MED ORDER — ASPIRIN 81 MG PO CHEW
81.0000 mg | CHEWABLE_TABLET | ORAL | Status: AC
  Administered 2021-09-01: 81 mg via ORAL
  Filled 2021-09-01: qty 1

## 2021-09-01 MED ORDER — SODIUM CHLORIDE 0.9 % IV SOLN: 250.0000 mL | INTRAVENOUS | Status: DC | PRN

## 2021-09-01 MED ORDER — FENTANYL CITRATE (PF) 100 MCG/2ML IJ SOLN
INTRAMUSCULAR | Status: AC
  Filled 2021-09-01: qty 2

## 2021-09-01 MED ORDER — MIDAZOLAM HCL 2 MG/2ML IJ SOLN
INTRAMUSCULAR | Status: DC | PRN
  Administered 2021-09-01: 1 mg via INTRAVENOUS

## 2021-09-01 MED ORDER — LABETALOL HCL 5 MG/ML IV SOLN: 10.0000 mg | INTRAVENOUS | Status: DC | PRN

## 2021-09-01 MED ORDER — SODIUM CHLORIDE 0.9 % WEIGHT BASED INFUSION: 1.0000 mL/kg/h | INTRAVENOUS | Status: DC

## 2021-09-01 MED ORDER — LIDOCAINE HCL (PF) 1 % IJ SOLN
INTRAMUSCULAR | Status: DC | PRN
  Administered 2021-09-01: 15 mL

## 2021-09-01 MED ORDER — SODIUM CHLORIDE 0.9% FLUSH: 3.0000 mL | Freq: Two times a day (BID) | INTRAVENOUS | Status: DC

## 2021-09-01 MED ORDER — HEPARIN (PORCINE) IN NACL 1000-0.9 UT/500ML-% IV SOLN
INTRAVENOUS | Status: DC | PRN
  Administered 2021-09-01 (×2): 500 mL

## 2021-09-01 MED ORDER — SODIUM CHLORIDE 0.9% FLUSH: 3.0000 mL | INTRAVENOUS | Status: DC | PRN

## 2021-09-01 MED ORDER — FENTANYL CITRATE (PF) 100 MCG/2ML IJ SOLN
INTRAMUSCULAR | Status: DC | PRN
  Administered 2021-09-01: 25 ug via INTRAVENOUS

## 2021-09-01 MED ORDER — ONDANSETRON HCL 4 MG/2ML IJ SOLN: 4.0000 mg | Freq: Four times a day (QID) | INTRAMUSCULAR | Status: DC | PRN

## 2021-09-01 MED ORDER — MIDAZOLAM HCL 2 MG/2ML IJ SOLN
INTRAMUSCULAR | Status: AC
  Filled 2021-09-01: qty 2

## 2021-09-01 MED ORDER — ACETAMINOPHEN 325 MG PO TABS: 650.0000 mg | ORAL_TABLET | ORAL | Status: DC | PRN

## 2021-09-01 MED ORDER — IOHEXOL 350 MG/ML SOLN
INTRAVENOUS | Status: DC | PRN
  Administered 2021-09-01: 75 mL via INTRA_ARTERIAL

## 2021-09-01 MED ORDER — HEPARIN (PORCINE) IN NACL 1000-0.9 UT/500ML-% IV SOLN
INTRAVENOUS | Status: AC
  Filled 2021-09-01: qty 1000

## 2021-09-01 MED ORDER — HYDRALAZINE HCL 20 MG/ML IJ SOLN: 10.0000 mg | INTRAMUSCULAR | Status: DC | PRN

## 2021-09-01 SURGICAL SUPPLY — 15 items
CATH INFINITI 5FR MULTPACK ANG (CATHETERS) ×1 IMPLANT
CATH SWAN GANZ 7F STRAIGHT (CATHETERS) ×1 IMPLANT
CLOSURE MYNX CONTROL 5F (Vascular Products) ×1 IMPLANT
CLOSURE MYNX CONTROL 6F/7F (Vascular Products) ×1 IMPLANT
KIT HEART LEFT (KITS) ×2 IMPLANT
PACK CARDIAC CATHETERIZATION (CUSTOM PROCEDURE TRAY) ×2 IMPLANT
SHEATH PINNACLE 5F 10CM (SHEATH) ×1 IMPLANT
SHEATH PINNACLE 6F 10CM (SHEATH) ×1 IMPLANT
SHEATH PINNACLE 7F 10CM (SHEATH) ×1 IMPLANT
SHEATH PROBE COVER 6X72 (BAG) ×1 IMPLANT
SYR MEDRAD MARK 7 150ML (SYRINGE) ×2 IMPLANT
TRANSDUCER W/STOPCOCK (MISCELLANEOUS) ×2 IMPLANT
TUBING CIL FLEX 10 FLL-RA (TUBING) ×2 IMPLANT
WIRE EMERALD 3MM-J .035X150CM (WIRE) ×1 IMPLANT
WIRE MICRO SET SILHO 5FR 7 (SHEATH) ×1 IMPLANT

## 2021-09-01 NOTE — Progress Notes (Signed)
Client up and walked and tolerated well; right groin stable, no bleeding or hematoma

## 2021-09-01 NOTE — Discharge Instructions (Signed)
NO METFORMIN FOR 2 DAYS 

## 2021-09-01 NOTE — Interval H&P Note (Signed)
Cath Lab Visit (complete for each Cath Lab visit)  Clinical Evaluation Leading to the Procedure:   ACS: No.  Non-ACS:    Anginal Classification: CCS III  Anti-ischemic medical therapy: Minimal Therapy (1 class of medications)  Non-Invasive Test Results: High-risk stress test findings: cardiac mortality >3%/year  Prior CABG: Previous CABG      History and Physical Interval Note:  09/01/2021 8:09 AM  Kevin Mcclain  has presented today for surgery, with the diagnosis of abnormal neuclear pet scan.  The various methods of treatment have been discussed with the patient and family. After consideration of risks, benefits and other options for treatment, the patient has consented to  Procedure(s): RIGHT/LEFT HEART CATH AND CORONARY/GRAFT ANGIOGRAPHY (N/A) as a surgical intervention.  The patient's history has been reviewed, patient examined, no change in status, stable for surgery.  I have reviewed the patient's chart and labs.  Questions were answered to the patient's satisfaction.     Sherren Mocha

## 2021-09-08 ENCOUNTER — Other Ambulatory Visit (HOSPITAL_COMMUNITY): Payer: Self-pay

## 2021-09-14 NOTE — Progress Notes (Unsigned)
Cardiology Office Note:    Date:  09/17/2021   ID:  Kevin Mcclain, DOB 14-Sep-1945, MRN [id number]  PCP:  Marin Olp, MD   [facility] Providers Cardiologist:  Freada Bergeron, MD {  Referring MD: Marin Olp, MD    History of Present Illness:    Kevin Mcclain is a 76 y.o. male with a hx of multiple Mis in the past, CAD s/p CABG in 2005, HTN, HLD, DMII, throat cancer s/p XRT, and GERD who presents to clinic for follow-up.    Saw Dr. Yong Channel on 06/30/21 with 2 month history of chest pain and associated diaphoresis. Given his history of known CAD with prior CABG x6 in 2005, he was referred urgently to Cardiology for further evaluation.  Was initially seen in clinic by me on 06/2021 for chest pain, SOB and decreased exercise tolerance. He was also noted to have a loud systolic murmur on exam. TTE 06/2021 showed EF 45050%, G2DD, inferior akinesis, moderate pulmonary HTN, moderate-to-severe MR, mild-mod AI. Cardiac PET with anterior infarct with peri-infarct ischemia and drop in EF with stress. Cath 08/2021 with severe native vessel disease, patent LIMA-LAD and SVG to OM and occlusion of SVG-PDA and PLA and occlusion of SVG-D1. RHC with moderate pulmonary HTN with mPAP 69mHg, V wave 265mg. CMR is pending.   Today, the patient overall feels better. His shortness of breath is improved but he is pretty sedentary at baseline. No chest pain. He continues to sleep on an incline ever since her was diagnosed and treated for his cancer. No PND, palpitations. Has occasional lightheadedness with position changes. No LE edema.  Long discussion held today about possible next steps. He would like to pursue CMR in a couple of months (wants a break from testing) and come back to go over the results.   Past Medical History:  Diagnosis Date   Allergy    Arthritis    Ascending aortic aneurysm ([place]1/24/2018   03/23/16 CTA chest Mildly aneurysmal ascending aorta at 4.0 cm.  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    Hyperlipidemia    Hyperplastic colon polyp    Hypertension    Kidney stones    Moderate to severe mitral regurgitation 08/19/2021   Echocardiogram 06/2021: EF 45-50, mod to severe MR   Rheumatoid arthritis ([place]   Shingles outbreak 04/23/15   left shoulder and left arm   Vertigo     Past Surgical History:  Procedure Laterality Date   CHOLECYSTECTOMY  2012   COLONOSCOPY W/ POLYPECTOMY  2015   CORONARY ARTERY BYPASS GRAFT  2005   6 grafts   CYSTOSCOPY     kidney stone removal   IR GASTROSTOMY TUBE REMOVAL  06/25/2016   LITHOTRIPSY     MULTIPLE EXTRACTIONS WITH ALVEOLOPLASTY N/A 11/14/2014  Procedure: Extraction of tooth #2 and 31 with alveoloplasty after sectioning of bridge at distal #29 with full mouth debridement of remaining dention;  Surgeon: Lenn Cal, DDS;  Location: [place];  Service: Oral Surgery;  Laterality: N/A;   NASAL SINUS SURGERY Right 11/14/2014   Procedure: RIGHT ENDOSCOPIC ANTROSTOMY AND ANTERIOR ETHMOIDECTOMY ;  Surgeon: Jodi Marble, MD;  Location: [place];  Service: ENT;  Laterality: Right;   RIGHT/LEFT HEART CATH AND CORONARY/GRAFT ANGIOGRAPHY N/A 09/01/2021   Procedure: RIGHT/LEFT HEART CATH AND CORONARY/GRAFT ANGIOGRAPHY;  Surgeon: Sherren Mocha, MD;  Location: [place] CV LAB;  Service:  Cardiovascular;  Laterality: N/A;    Current Medications: Current Meds  Medication Sig   allopurinol (ZYLOPRIM) 100 MG tablet Take 1 tablet (100 mg total) by mouth daily.   Ascorbic Acid (VITAMIN C PO) Take 1 tablet by mouth every evening.   aspirin EC 81 MG tablet Take 81 mg by mouth in the morning.   atorvastatin (LIPITOR) 20 MG tablet Take 1 tablet (20 mg total) by mouth daily.   colchicine 0.6 MG tablet TAKE 2 TABLETS BY MOUTH AT 1ST SIGN OF GOUT FLARE, MAY REPEAT 1 TABLET IN 2HRS, THEN ONCE DAILY UNTIL SYMPTOMS RESOLVE   fentaNYL (DURAGESIC) 50 MCG/HR Place 1 patch onto the skin every 3 (three) days.   fentaNYL (DURAGESIC) 50 MCG/HR Place 1 patch onto the skin every 3 (three) days.   fentaNYL (DURAGESIC) 50 MCG/HR Place 1 patch onto the skin every 3 (three) days.DNF until 07/18/21   [START ON 10/15/2021] fentaNYL (DURAGESIC) 50 MCG/HR Place 1 patch onto the skin every 3 (three) days.   [START ON 11/14/2021] fentaNYL (DURAGESIC) 50 MCG/HR Place 1 patch onto the skin every 3 (three) days.   levothyroxine (SYNTHROID) 100 MCG tablet Take 1 tablet (100 mcg total) by mouth daily before breakfast.   metFORMIN (GLUCOPHAGE) 500 MG tablet Take 1 tablet (500 mg total) by mouth daily with breakfast. TAKE 1 TABLET BY MOUTH DAILY WITH BREAKFAST.   metoprolol succinate (TOPROL-XL) 25 MG 24 hr tablet TAKE 1 TABLET (25 MG TOTAL) BY MOUTH DAILY. TAKE WITH OR IMMEDIATELY FOLLOWING A MEAL.   Naphazoline-Glycerin (CLEAR EYES COOLING COMFORT) 0.03-0.5 % SOLN Place 1-2 drops into both eyes 3 (three) times daily as needed (dry/irritated eyes.).   nitroGLYCERIN (NITROSTAT) 0.4 MG SL tablet Place 1 tablet (0.4 mg total) under the tongue every 5 (five) minutes as needed.   oxyCODONE (ROXICODONE) 5 MG/5ML solution Take 10-15 mLs (10-15 mg total) by mouth every 4 (four) hours as needed for breakthrough cancer pain.   oxyCODONE (ROXICODONE) 5 MG/5ML solution Take 10-15 mLs (10-15 mg total) by mouth every 4 (four) hours as  needed for breakthrough cancer pain. (DNF until 04-23-21)   oxyCODONE (ROXICODONE) 5 MG/5ML solution Take 10-15 mLs (10-15 mg total) by mouth every 4 (four) hours as needed for breakthrough cancer pain   oxyCODONE (ROXICODONE) 5 MG/5ML solution Take 10-15 mLs (10-15 mg total) by mouth every 4 (four) hours as needed for breakthrough cancer pain   oxyCODONE (ROXICODONE) 5 MG/5ML solution Take 10-15 mLs (10-15 mg total) by mouth every 4 (four) hours as needed for breakthrough cancer pain   oxyCODONE (ROXICODONE) 5 MG/5ML solution Take 10-15 mLs (10-15 mg total) by mouth every 4 (four) hours as needed for breakthrough cancer pain.   oxyCODONE (ROXICODONE) 5 MG/5ML solution Take 10-15 mLs (10-15 mg total) by mouth every 4 (four) hours as needed for breakthrough cancer pain (DNFB 07-18-21)   [START ON 10/15/2021]  oxyCODONE (ROXICODONE) 5 MG/5ML solution Take 10-15 mLs (10-15 mg total) by mouth every 4 (four) hours as needed for breakthrough cancer pain   [START ON 11/14/2021] oxyCODONE (ROXICODONE) 5 MG/5ML solution Take 10-15 mLs (10-15 mg total) by mouth every 4 (four) hours as needed for breakthrough cancer pain   prochlorperazine (COMPAZINE) 10 MG tablet TAKE 1 TABLET BY MOUTH EVERY 6 HOURS AS NEEDED FOR NAUSEA OR VOMITING.   ramipril (ALTACE) 5 MG capsule TAKE 2 CAPSULES BY MOUTH EVERY MORNING   sertraline (ZOLOFT) 100 MG tablet TAKE 1 TABLET BY MOUTH EVERY DAY   sodium fluoride (PREVIDENT 5000 PLUS) 1.1 % CREA dental cream Apply to tooth brush. Brush teeth for 2 minutes. Spit out excess-DO NOT swallow. Repeat nightly.   spironolactone (ALDACTONE) 25 MG tablet Take 0.5 tablets (12.5 mg total) by mouth daily.   VITAMIN D PO Take 1 tablet by mouth every evening.     Allergies:   Codeine, Sitagliptin phosphate, and Tessalon [benzonatate]   Social History   Socioeconomic History   Marital status: Married    Spouse name: Not on file   Number of children: Not on file   Years of education: Not on file    Highest education level: Not on file  Occupational History   Occupation: Retired   Tobacco Use   Smoking status: Never   Smokeless tobacco: Never  Vaping Use   Vaping Use: Never used  Substance and Sexual Activity   Alcohol use: No   Drug use: No   Sexual activity: Not on file  Other Topics Concern   Not on file  Social History Narrative   Married 35 years in October 2017.       Occupation :  Retired Arboriculturist for [employer].  Worked for [employer]: garden and work in [place] Strain: [place]  (07/18/2021)   Overall Financial Resource Strain (CARDIA)    Difficulty of Paying Living Expenses: Not hard at all  Food Insecurity: No Food Insecurity (07/18/2021)   Hunger Vital Sign    Worried About Running Out of Food in the Last Year: Never true    [place] in the Last Year: Never true  Transportation Needs: No Transportation Needs (07/18/2021)   PRAPARE - Hydrologist (Medical): No    Lack of Transportation (Non-Medical): No  Physical Activity: Inactive (07/18/2021)   Exercise Vital Sign    Days of Exercise per Week: 0 days    Minutes of Exercise per Session: 0 min  Stress: No Stress Concern Present (07/18/2021)   [place]    Feeling of Stress : Not at all  Social Connections: Moderately Isolated (07/18/2021)   Social Connection and Isolation Panel [NHANES]    Frequency of Communication with Friends and Family: Never    Frequency of Social Gatherings with Friends and Family: Twice a week    Attends Religious Services: 1 to 4 times per year    Active Member of [employer] or Organizations: No    Attends Music therapist: Never    Marital Status: Married     Family History: The patient's family history includes CAD in his sister; Colon cancer in his father; Coronary artery disease (age of  onset: 37) in his brother; Heart attack in his mother; Heart disease in his father; Lymphoma in his  sister; Stroke in his father; Stroke (age of onset: 85) in his sister.  ROS:   Please see the history of present illness.    Review of Systems  Constitutional:  Positive for malaise/fatigue.  Respiratory:  Positive for shortness of breath.   Cardiovascular:  Negative for chest pain, palpitations, orthopnea, claudication, leg swelling and PND.  Gastrointestinal:  Negative for nausea and vomiting.  Genitourinary:  Negative for hematuria.  Musculoskeletal:  Negative for falls.  Neurological:  Negative for dizziness and loss of consciousness.     EKGs/Labs/Other Studies Reviewed:    The following studies were reviewed today: Cath 09/01/21:   Mid RCA to Dist RCA lesion is 100% stenosed.   Ost LAD to Prox LAD lesion is 100% stenosed.   Ost Cx to Prox Cx lesion is 100% stenosed.   Origin lesion before 1st RPL  is 100% stenosed.   Origin to Prox Graft lesion is 100% stenosed.   LIMA.   Seq SVG- PDA and PLA.   Severe native three-vessel coronary artery disease with total occlusion of the LAD, left circumflex, and RCA Status post aortocoronary bypass surgery with continued patency of the LIMA to LAD and bypass graft to OM, both vessels supplying collaterals to the PDA, PLA, and diagonal branch of the LAD Moderate pulmonary hypertension with mean PA pressure 33 mmHg, mean wedge pressure 23 mmHg, V wave 29 mmHg.  Preserved cardiac output.  Hemodynamic findings could be consistent with hemodynamically significant mitral regurgitation.  NM PET CT CARDIAC PERFUSION MULTI W/ABSOLUTE BLOODFLOW 08/12/2021   Findings are consistent with prior myocardial infarction with peri-infarct ischemia. The study is high risk due to presence of anterior infarct with peri-infarct ischemia, drop in EF with stress, and moderately reduced LVEF. Recommend cath for further evaluation.   LV perfusion is abnormal. There is  evidence of infarction with peri-infarct ischemia. There is a medium defect with severe reduction in uptake present in the apical to basal anterior and apex location(s) that is partially reversible. There is abnormal wall motion in the defect area. Consistent with peri-infarct ischemia. Defect 2: There is a small defect with mild reduction in uptake present in the basal inferior location(s) that is partially reversible. There is abnormal wall motion in the defect area. Consistent with peri-infarct ischemia.   Rest left ventricular function is abnormal. Rest global function is moderately reduced. There were multiple regional abnormalities. Rest EF: 32 %. Stress left ventricular function is abnormal. Stress global function is moderately reduced. There were multiple regional abnormalities. Stress EF: 30 %. End diastolic cavity size is severely enlarged. TID is not present.   Myocardial blood flow reserve is not reported in this patient as patient is s/p CABG which affects diagnostic accuracy.   Patient is s/p CABG. There is severe native vessel coronary calcium present on the attenuation correction CT images. Coronary calcifications were present in the left anterior descending artery, left circumflex artery and right coronary artery distribution(s).   NONCARDIAC IMPRESSION: 1. Moderate right and small left pleural effusions, both slightly reduced compared 07/17/2021. 2.  Aortic Atherosclerosis (ICD10-I70.0). 3. Pneumobilia. 4. Thoracic spondylosis.   ECHO COMPLETE WO IMAGING ENHANCING AGENT 07/04/2021 EF 45-50, GRII DD, inferior AK, normal RVSF, moderately elevated PASP (RVSP 52.3), moderate-severe mitral regurgitation, mild-moderate AI   Chest CTA 07/17/2021 IMPRESSION: 1. No evidence of pulmonary embolism. 2. Large right and small left pleural effusions with associated atelectasis. Smooth septal line thickening and ground-glass opacities, right greater than left, are suggestive of  pulmonary edema. 3.  Pneumobilia is partially imaged. 4. Ascending aorta measuring 4.1 cm. Recommend follow-up every 12 months and vascular consultation. This recommendation follows ACR consensus guidelines: White Paper of the ACR Incidental Findings Committee II on Vascular Findings. J Am Coll Radiol 2013; 10:789-794. Aortic Atherosclerosis (ICD10-I70.0).  EKG:  EKG: no new tracing  Recent Labs: 06/30/2021: ALT 17; TSH 8.93 08/19/2021: BUN 14; Creatinine, Ser 0.89; Platelets 200 09/01/2021: Hemoglobin 11.2; Hemoglobin 11.9; Potassium 3.5; Potassium 3.5; Sodium 141; Sodium 141  Recent Lipid Panel    Component Value Date/Time   CHOL 126 08/14/2021 0853   TRIG 90 08/14/2021 0853   HDL 44 08/14/2021 0853   CHOLHDL 2.9 08/14/2021 0853   CHOLHDL 3 07/10/2019 1631   VLDL 27.4 07/10/2019 1631   LDLCALC 65 08/14/2021 0853   LDLDIRECT 100.0 06/29/2016 1503     Risk Assessment/Calculations:           Physical Exam:    VS:  BP 136/78   Pulse (!) 55   Ht '5\' 5"'$  (1.651 m)   Wt 123 lb (55.8 kg)   SpO2 98%   BMI 20.47 kg/m     Wt Readings from Last 3 Encounters:  09/17/21 123 lb (55.8 kg)  09/01/21 123 lb (55.8 kg)  08/19/21 126 lb (57.2 kg)     GEN:  Elderly male, comfortable HEENT: Normal NECK: No JVD; No carotid bruits CARDIAC: RRR, 3/6 holosystolic murmur best heard at the apex RESPIRATORY:  Clear to auscultation without rales, wheezing or rhonchi  ABDOMEN: Soft, non-tender, non-distended MUSCULOSKELETAL:  No edema; No deformity  SKIN: Warm and dry NEUROLOGIC:  Alert and oriented x 3 PSYCHIATRIC:  Normal affect   ASSESSMENT:    1. Coronary artery disease involving native coronary artery of native heart with angina pectoris ([place])   2. Medication management   3. HFrEF (heart failure with reduced ejection fraction) ([place])   4. Moderate to severe mitral regurgitation   5. Essential hypertension   6. Aneurysm of ascending aorta without rupture ([place])   7. Chronic systolic  heart failure (HCC)    PLAN:    In order of problems listed above:  #Chest Pain: #CAD s/p CABG in 2005: Patient with extensive history of CAD with prior MI x3. Ultimately underwent CABG in 2005 at [facility] reportedly following cath where there was plaque embolization requiring urgent intervention. Cath with severe native vessel disease, patent LIMA-LAD, patent SVG-OM, occluded SVG-D1, occluded sequential PDA-PLA. Recommended for continued medical therapy. -Continue ASA '81mg'$  daily -Continue lipitor '20mg'$  daily -Continue metoprolol '25mg'$  XL daily -Continue ramipril '5mg'$  daily  #Moderate-to-Severe MR: TTE with posterior leaflet tethering and moderate to severe MR with large v-waves on cath. Will plan for CMR to further evaluate severity. Not pursuing TEE at this time due to history of neck radiation and difficulty swallowing.  -Check CMR -Will bring back in 3 months to discuss results -Can consider referral to structural pending work-up  #Chronic Systolic HF: LVEF 57-32% with inferior hypokinesis. Currently euvolemic and compensated on exam. -Continue metoprolol '25mg'$  XL daily -Continue ramipril '5mg'$  daily -Add spiro 12.'5mg'$  daily -BMET next week -Can add farxiga in future but patient would like to titrate slowly  #Mild-to-moderate AI: -Continue serial monitoring with TTEs   #HTN: Controlled at home.  -Continue metoprolol '25mg'$  XL daily -Continue ramipril '5mg'$  daily -Add spiro 12.'5mg'$  daily as above  #HLD: -Continue lipitor '20mg'$  daily -LDL 65 in 07/2020  #Ascending Aortic Aneurysm: 74m on CT chest in 2018. -Follow with TTEs/CTA  #Dysphagia: #History of Esophageal  Stricture: #Vomiting: Patient with history of esophageal stricture requiring dilation in the past. Symptoms have improved.  -Follow-up with GI      Shared Decision Making/Informed Consent The risks [chest pain, shortness of breath, cardiac arrhythmias, dizziness, blood pressure fluctuations, myocardial infarction,  stroke/transient ischemic attack, nausea, vomiting, allergic reaction, radiation exposure, metallic taste sensation and life-threatening complications (estimated to be 1 in 10,000)], benefits (risk stratification, diagnosing coronary artery disease, treatment guidance) and alternatives of a cardiac PET stress test were discussed in detail with Kevin Mcclain and he agrees to proceed.    Medication Adjustments/Labs and Tests Ordered: Current medicines are reviewed at length with the patient today.  Concerns regarding medicines are outlined above.  Orders Placed This Encounter  Procedures   Basic metabolic panel   Meds ordered this encounter  Medications   spironolactone (ALDACTONE) 25 MG tablet    Sig: Take 0.5 tablets (12.5 mg total) by mouth daily.    Dispense:  45 tablet    Refill:  3    Patient Instructions  Medication Instructions:   START TAKING SPIRONOLACTONE 12.5 MG BY MOUTH DAILY  *If you need a refill on your cardiac medications before your next appointment, please call your pharmacy*   Lab Work:  [place] OFFICE--BMET  If you have labs (blood work) drawn today and your tests are completely normal, you will receive your results only by: [place] (if you have MyChart) OR A paper copy in the mail If you have any lab test that is abnormal or we need to change your treatment, we will call you to review the results.   Testing/Procedures:  WE WILL HAVE OUR SCHEDULER REACH BACK OUT TO YOU TO RESCHEDULE THIS TEST FOR 2 MONTHS OUT PER DR. Johney Frame    You are scheduled for Cardiac MRI on ______________. Please arrive for your appointment at ______________ ( arrive 30-45 minutes prior to test start time). ?  [facility] [address] [place], [place] [address] [phone] Please take advantage of the free valet parking available at the MAIN entrance (A entrance).  Proceed to the [facility] Radiology Department (First Floor) for  check-in.   Magnetic resonance imaging (MRI) is a painless test that produces images of the inside of the body without using Xrays.  During an MRI, strong magnets and radio waves work together in a Research officer, political party to form detailed images.   MRI images may provide more details about a medical condition than X-rays, CT scans, and ultrasounds can provide.  You may be given earphones to listen for instructions.  You may eat a light breakfast and take medications as ordered with the exception of HCTZ (fluid pill, other). Please avoid stimulants for 12 hr prior to test. (Ie. Caffeine, nicotine, chocolate, or antihistamine medications)  If a contrast material will be used, an IV will be inserted into one of your veins. Contrast material will be injected into your IV. It will leave your body through your urine within a day. You may be told to drink plenty of fluids to help flush the contrast material out of your system.  You will be asked to remove all metal, including: Watch, jewelry, and other metal objects including hearing aids, hair pieces and dentures. Also wearable glucose monitoring systems (ie. Freestyle Libre and Omnipods) (Braces and fillings normally are not a problem.)   TEST WILL TAKE APPROXIMATELY 1 HOUR  PLEASE NOTIFY SCHEDULING AT LEAST 24 HOURS IN ADVANCE IF YOU ARE UNABLE TO  KEEP YOUR APPOINTMENT. [phone]  Please call Marchia Bond, cardiac imaging nurse navigator with any questions/concerns. Marchia Bond RN Navigator Cardiac Imaging Gordy Clement RN Navigator Cardiac Imaging [place] [phone] Office     Follow-Up:  3 MONTHS IN THE OFFICE WITH DR. Johney Frame   Important Information About Sugar         Signed, Freada Bergeron, MD  09/17/2021 1:33 PM    [facility]

## 2021-09-15 ENCOUNTER — Other Ambulatory Visit (HOSPITAL_COMMUNITY): Payer: Self-pay

## 2021-09-15 DIAGNOSIS — R69 Illness, unspecified: Secondary | ICD-10-CM | POA: Diagnosis not present

## 2021-09-15 DIAGNOSIS — R52 Pain, unspecified: Secondary | ICD-10-CM | POA: Diagnosis not present

## 2021-09-15 DIAGNOSIS — C099 Malignant neoplasm of tonsil, unspecified: Secondary | ICD-10-CM | POA: Diagnosis not present

## 2021-09-15 MED ORDER — FENTANYL 50 MCG/HR TD PT72
1.0000 | MEDICATED_PATCH | TRANSDERMAL | 0 refills | Status: DC
  Filled 2021-11-17: qty 10, 30d supply, fill #0

## 2021-09-15 MED ORDER — OXYCODONE HCL 5 MG/5ML PO SOLN
10.0000 mg | ORAL | 0 refills | Status: DC | PRN
  Filled 2021-11-17: qty 2500, 30d supply, fill #0

## 2021-09-15 MED ORDER — OXYCODONE HCL 5 MG/5ML PO SOLN
10.0000 mg | ORAL | 0 refills | Status: DC | PRN
Start: 2021-10-15 — End: 2021-10-28
  Filled 2021-10-15: qty 2500, 30d supply, fill #0

## 2021-09-15 MED ORDER — FENTANYL 50 MCG/HR TD PT72
1.0000 | MEDICATED_PATCH | TRANSDERMAL | 0 refills | Status: DC
  Filled 2021-10-15: qty 10, 30d supply, fill #0

## 2021-09-16 ENCOUNTER — Encounter: Payer: Medicare HMO | Admitting: Family Medicine

## 2021-09-17 ENCOUNTER — Encounter: Payer: Self-pay | Admitting: Cardiology

## 2021-09-17 ENCOUNTER — Ambulatory Visit: Payer: Medicare HMO | Admitting: Cardiology

## 2021-09-17 VITALS — BP 136/78 | HR 55 | Ht 65.0 in | Wt 123.0 lb

## 2021-09-17 DIAGNOSIS — I7121 Aneurysm of the ascending aorta, without rupture: Secondary | ICD-10-CM | POA: Diagnosis not present

## 2021-09-17 DIAGNOSIS — I5020 Unspecified systolic (congestive) heart failure: Secondary | ICD-10-CM | POA: Diagnosis not present

## 2021-09-17 DIAGNOSIS — I10 Essential (primary) hypertension: Secondary | ICD-10-CM | POA: Diagnosis not present

## 2021-09-17 DIAGNOSIS — I25119 Atherosclerotic heart disease of native coronary artery with unspecified angina pectoris: Secondary | ICD-10-CM

## 2021-09-17 DIAGNOSIS — I5022 Chronic systolic (congestive) heart failure: Secondary | ICD-10-CM | POA: Diagnosis not present

## 2021-09-17 DIAGNOSIS — Z79899 Other long term (current) drug therapy: Secondary | ICD-10-CM | POA: Diagnosis not present

## 2021-09-17 DIAGNOSIS — I340 Nonrheumatic mitral (valve) insufficiency: Secondary | ICD-10-CM | POA: Diagnosis not present

## 2021-09-17 MED ORDER — SPIRONOLACTONE 25 MG PO TABS: 12.5000 mg | ORAL_TABLET | Freq: Every day | ORAL | 3 refills | Status: DC

## 2021-09-17 NOTE — Patient Instructions (Signed)
Medication Instructions:   START TAKING SPIRONOLACTONE 12.5 MG BY MOUTH DAILY  *If you need a refill on your cardiac medications before your next appointment, please call your pharmacy*   Lab Work:  [place] OFFICE--BMET  If you have labs (blood work) drawn today and your tests are completely normal, you will receive your results only by: [place] (if you have MyChart) OR A paper copy in the mail If you have any lab test that is abnormal or we need to change your treatment, we will call you to review the results.   Testing/Procedures:  WE WILL HAVE OUR SCHEDULER REACH BACK OUT TO YOU TO RESCHEDULE THIS TEST FOR 2 MONTHS OUT PER DR. Johney Frame    You are scheduled for Cardiac MRI on ______________. Please arrive for your appointment at ______________ ( arrive 30-45 minutes prior to test start time). ?  [facility] [address] [place], [place] [address] [phone] Please take advantage of the free valet parking available at the MAIN entrance (A entrance).  Proceed to the [facility] Radiology Department (First Floor) for check-in.   Magnetic resonance imaging (MRI) is a painless test that produces images of the inside of the body without using Xrays.  During an MRI, strong magnets and radio waves work together in a Research officer, political party to form detailed images.   MRI images may provide more details about a medical condition than X-rays, CT scans, and ultrasounds can provide.  You may be given earphones to listen for instructions.  You may eat a light breakfast and take medications as ordered with the exception of HCTZ (fluid pill, other). Please avoid stimulants for 12 hr prior to test. (Ie. Caffeine, nicotine, chocolate, or antihistamine medications)  If a contrast material will be used, an IV will be inserted into one of your veins. Contrast material will be injected into your IV. It will leave your body through your urine within a day. You may  be told to drink plenty of fluids to help flush the contrast material out of your system.  You will be asked to remove all metal, including: Watch, jewelry, and other metal objects including hearing aids, hair pieces and dentures. Also wearable glucose monitoring systems (ie. Freestyle Libre and Omnipods) (Braces and fillings normally are not a problem.)   TEST WILL TAKE APPROXIMATELY 1 HOUR  PLEASE NOTIFY SCHEDULING AT LEAST 24 HOURS IN ADVANCE IF YOU ARE UNABLE TO KEEP YOUR APPOINTMENT. [phone]  Please call Marchia Bond, cardiac imaging nurse navigator with any questions/concerns. Marchia Bond RN Navigator Cardiac Imaging Gordy Clement RN Navigator Cardiac Imaging [place] [facility] [phone] Office     Follow-Up:  3 MONTHS IN THE OFFICE WITH DR. Johney Frame   Important Information About Sugar

## 2021-09-22 ENCOUNTER — Telehealth: Payer: Self-pay | Admitting: *Deleted

## 2021-09-22 NOTE — Telephone Encounter (Signed)
-----   Message from Nuala Alpha, LPN sent at 03/31/2907 10:14 AM EDT ----- Regarding: PT NEEDS HIS CARDIAC MRI RESCHEDULED PER DR. Johney Frame Dr. Johney Frame saw this pt in clinic for follow-up.  She originally ordered for him to get a CMR back at last OV and pt had this scheduled and no showed.  Dr. Johney Frame has now convinced him to reschedule this and he agreed.  Patient agreed to have this done, but he wants it done in 2 months, then we will see him back in 3 months for follow-up.  Dr. Johney Frame agreed to this.  With that said, can you please call the pt and reschedule his CMR for 2 months out?  He is aware you will be calling him again.  Shoot me the date thereafter?  Order is still there from previous time.   Thanks so much, [employer]

## 2021-09-22 NOTE — Telephone Encounter (Signed)
Pts Cardiac MRI is now scheduled for 12/02/21 at 12 pm. Pt made aware of appt date and time by MRI Scheduler.

## 2021-09-24 ENCOUNTER — Other Ambulatory Visit: Payer: Medicare HMO

## 2021-09-24 DIAGNOSIS — I340 Nonrheumatic mitral (valve) insufficiency: Secondary | ICD-10-CM

## 2021-09-24 DIAGNOSIS — I5020 Unspecified systolic (congestive) heart failure: Secondary | ICD-10-CM

## 2021-09-24 DIAGNOSIS — Z79899 Other long term (current) drug therapy: Secondary | ICD-10-CM

## 2021-09-24 DIAGNOSIS — I25119 Atherosclerotic heart disease of native coronary artery with unspecified angina pectoris: Secondary | ICD-10-CM | POA: Diagnosis not present

## 2021-09-24 LAB — BASIC METABOLIC PANEL
BUN/Creatinine Ratio: 14 (ref 10–24)
BUN: 14 mg/dL (ref 8–27)
CO2: 24 mmol/L (ref 20–29)
Calcium: 9.7 mg/dL (ref 8.6–10.2)
Chloride: 100 mmol/L (ref 96–106)
Creatinine, Ser: 1.03 mg/dL (ref 0.76–1.27)
Glucose: 229 mg/dL — ABNORMAL HIGH (ref 70–99)
Potassium: 4 mmol/L (ref 3.5–5.2)
Sodium: 139 mmol/L (ref 134–144)
eGFR: 74 mL/min/{1.73_m2} (ref >59)

## 2021-10-13 ENCOUNTER — Other Ambulatory Visit (HOSPITAL_COMMUNITY): Payer: Self-pay

## 2021-10-14 ENCOUNTER — Ambulatory Visit: Payer: Medicare HMO | Admitting: Physician Assistant

## 2021-10-15 ENCOUNTER — Other Ambulatory Visit (HOSPITAL_COMMUNITY): Payer: Self-pay

## 2021-10-28 ENCOUNTER — Ambulatory Visit (INDEPENDENT_AMBULATORY_CARE_PROVIDER_SITE_OTHER): Payer: Medicare HMO | Admitting: Family Medicine

## 2021-10-28 ENCOUNTER — Encounter: Payer: Self-pay | Admitting: Family Medicine

## 2021-10-28 VITALS — BP 120/60 | HR 58 | Temp 98.3°F | Ht 65.0 in | Wt 121.0 lb

## 2021-10-28 DIAGNOSIS — D692 Other nonthrombocytopenic purpura: Secondary | ICD-10-CM | POA: Diagnosis not present

## 2021-10-28 DIAGNOSIS — G620 Drug-induced polyneuropathy: Secondary | ICD-10-CM | POA: Diagnosis not present

## 2021-10-28 DIAGNOSIS — Z0000 Encounter for general adult medical examination without abnormal findings: Secondary | ICD-10-CM

## 2021-10-28 DIAGNOSIS — E1142 Type 2 diabetes mellitus with diabetic polyneuropathy: Secondary | ICD-10-CM | POA: Diagnosis not present

## 2021-10-28 DIAGNOSIS — E039 Hypothyroidism, unspecified: Secondary | ICD-10-CM | POA: Diagnosis not present

## 2021-10-28 DIAGNOSIS — M1000 Idiopathic gout, unspecified site: Secondary | ICD-10-CM

## 2021-10-28 DIAGNOSIS — E538 Deficiency of other specified B group vitamins: Secondary | ICD-10-CM

## 2021-10-28 DIAGNOSIS — R112 Nausea with vomiting, unspecified: Secondary | ICD-10-CM | POA: Diagnosis not present

## 2021-10-28 DIAGNOSIS — R69 Illness, unspecified: Secondary | ICD-10-CM | POA: Diagnosis not present

## 2021-10-28 DIAGNOSIS — F325 Major depressive disorder, single episode, in full remission: Secondary | ICD-10-CM

## 2021-10-28 MED ORDER — EMPAGLIFLOZIN 10 MG PO TABS: 10.0000 mg | ORAL_TABLET | Freq: Every day | ORAL | 5 refills | Status: DC

## 2021-10-28 MED ORDER — SERTRALINE HCL 50 MG PO TABS: 50.0000 mg | ORAL_TABLET | Freq: Every day | ORAL | 3 refills | Status: DC

## 2021-10-28 NOTE — Progress Notes (Addendum)
Phone: [phone]   Subjective:  Patient presents today for their annual physical. Chief complaint-noted.   See problem oriented charting- ROS- full  review of systems was completed and negative  except for: chronic pain after radiation, hard of hearing, joint pain from arthritis  The following were reviewed and entered/updated in epic: Past Medical History:  Diagnosis Date   Allergy    Arthritis    Ascending aortic aneurysm ([place]) 03/25/2016   03/23/16 CTA chest Mildly aneurysmal ascending aorta at 4.0 cm.  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    Hyperlipidemia    Hyperplastic colon polyp    Hypertension    Kidney stones    Moderate to severe mitral regurgitation 08/19/2021   Echocardiogram 06/2021: EF 45-50, mod to severe MR   Rheumatoid arthritis ([place])    Shingles outbreak 04/23/15   left shoulder and left arm   Vertigo    Patient Active Problem List   Diagnosis Date Noted   Ascending aortic aneurysm ([place]) 03/25/2016    Priority: High   History of tonsillar cancer 2016- with history of radiation 10/31/2014    Priority: High   Osteoarthritis 11/18/2010    Priority: High   CAD (coronary artery disease) 10/11/2008    Priority: High   Type 2 diabetes mellitus with peripheral neuropathy ([place]) 01/24/2007    Priority: High   Atherosclerosis of  aorta ([place]) 07/10/2019    Priority: Medium    BPH associated with nocturia 10/30/2016    Priority: Medium    Hypothyroidism 09/11/2016    Priority: Medium    Depression, major, single episode, moderate ([place]) 04/02/2016    Priority: Medium    Diabetic neuropathy ([place]) 12/26/2015    Priority: Medium    Gout 03/28/2007    Priority: Medium    Hyperlipidemia LDL goal <70 01/24/2007    Priority: Medium    Essential hypertension 01/24/2007    Priority: Medium    Senile purpura ([place]) 03/17/2017    Priority: Low   B12 deficiency 01/15/2017    Priority: Low   Herpes zoster 07/15/2015    Priority: Low   GERD (gastroesophageal reflux disease) 06/10/2015    Priority: Low   Arthropathy 03/10/2013    Priority: Low   Low back pain 11/26/2011    Priority: Low   Kidney stone 11/04/2011    Priority: Low   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) ([place]) 08/19/2021   Moderate to severe mitral regurgitation 08/19/2021   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   Past Surgical History:  Procedure Laterality Date   CHOLECYSTECTOMY  2012   COLONOSCOPY W/ POLYPECTOMY  2015   CORONARY ARTERY BYPASS GRAFT  2005   6 grafts   CYSTOSCOPY     kidney stone removal   IR GASTROSTOMY TUBE REMOVAL  06/25/2016  LITHOTRIPSY     MULTIPLE EXTRACTIONS WITH ALVEOLOPLASTY N/A 11/14/2014   Procedure: Extraction of tooth #2 and 31 with alveoloplasty after sectioning of bridge at distal #29 with full mouth debridement of remaining dention;  Surgeon: Lenn Cal, DDS;  Location: [facility];  Service: Oral Surgery;  Laterality: N/A;   NASAL SINUS SURGERY Right 11/14/2014   Procedure: RIGHT ENDOSCOPIC ANTROSTOMY AND ANTERIOR ETHMOIDECTOMY ;  Surgeon: Jodi Marble, MD;  Location: [facility];  Service: ENT;  Laterality: Right;   RIGHT/LEFT HEART CATH AND CORONARY/GRAFT ANGIOGRAPHY N/A 09/01/2021   Procedure: RIGHT/LEFT HEART CATH AND CORONARY/GRAFT ANGIOGRAPHY;  Surgeon: Sherren Mocha, MD;  Location:  [facility];  Service: Cardiovascular;  Laterality: N/A;    Family History  Problem Relation Age of Onset   Heart attack Mother        deceased age 32 secondary   Colon cancer Father    Stroke Father        deceased age 53   Heart disease Father        heart attack   Stroke Sister 50   Coronary artery disease Brother 52       s/p bypass surgery /stent   CAD Sister        potentially   Lymphoma Sister     Medications- reviewed and updated Current Outpatient Medications  Medication Sig Dispense Refill   allopurinol (ZYLOPRIM) 100 MG tablet Take 1 tablet (100 mg total) by mouth daily. 90 tablet 3   Ascorbic Acid (VITAMIN C PO) Take 1 tablet by mouth every evening.     aspirin EC 81 MG tablet Take 81 mg by mouth in the morning.     atorvastatin (LIPITOR) 20 MG tablet Take 1 tablet (20 mg total) by mouth daily. 90 tablet 3   colchicine 0.6 MG tablet TAKE 2 TABLETS BY MOUTH AT 1ST SIGN OF GOUT FLARE, MAY REPEAT 1 TABLET IN 2HRS, THEN ONCE DAILY UNTIL SYMPTOMS RESOLVE 90 tablet 1   fentaNYL (DURAGESIC) 50 MCG/HR Place 1 patch onto the skin every 3 (three) days. 10 patch 0   levothyroxine (SYNTHROID) 100 MCG tablet Take 1 tablet (100 mcg total) by mouth daily before breakfast. 90 tablet 3   metoprolol succinate (TOPROL-XL) 25 MG 24 hr tablet TAKE 1 TABLET (25 MG TOTAL) BY MOUTH DAILY. TAKE WITH OR IMMEDIATELY FOLLOWING A MEAL. 90 tablet 3   Naphazoline-Glycerin (CLEAR EYES COOLING COMFORT) 0.03-0.5 % SOLN Place 1-2 drops into both eyes 3 (three) times daily as needed (dry/irritated eyes.).     nitroGLYCERIN (NITROSTAT) 0.4 MG SL tablet Place 1 tablet (0.4 mg total) under the tongue every 5 (five) minutes as needed. 50 tablet 3   oxyCODONE (ROXICODONE) 5 MG/5ML solution Take 10-15 mLs (10-15 mg total) by mouth every 4 (four) hours as needed for breakthrough cancer pain. 2500 mL 0   prochlorperazine (COMPAZINE) 10 MG tablet TAKE 1 TABLET BY MOUTH EVERY 6 HOURS AS NEEDED FOR NAUSEA OR  VOMITING. 30 tablet 5   ramipril (ALTACE) 5 MG capsule TAKE 2 CAPSULES BY MOUTH EVERY MORNING 180 capsule 3   sodium fluoride (PREVIDENT 5000 PLUS) 1.1 % CREA dental cream Apply to tooth brush. Brush teeth for 2 minutes. Spit out excess-DO NOT swallow. Repeat nightly. 1 Tube prn   spironolactone (ALDACTONE) 25 MG tablet Take 0.5 tablets (12.5 mg total) by mouth daily. 45 tablet 3   VITAMIN D PO Take 1 tablet by mouth every evening.     No current facility-administered  medications for this visit.    Allergies-reviewed and updated Allergies  Allergen Reactions   Codeine Itching   Sitagliptin Phosphate Itching    (Januvia)   Tessalon [Benzonatate] Swelling    "swells my throat"    Social History   Social History Narrative   Married 35 years in October 2017.       Occupation :  Retired Arboriculturist for [employer].  Worked for [employer]: garden and work in yard   Objective  Objective:  BP 120/60   Pulse (!) 58   Temp 98.3 F (36.8 C)   Ht '5\' 5"'$  (1.651 m)   Wt 121 lb (54.9 kg)   SpO2 98%   BMI 20.14 kg/m  Gen: NAD, resting comfortably HEENT: Mucous membranes are moist. Oropharynx rather dry with scattered changes related to prior radiation likely- some erythema Neck: no thyromegaly CV: RRR no murmurs rubs or gallops Lungs: CTAB no crackles, wheeze, rhonchi Abdomen: soft/nontender/nondistended/normal bowel sounds. No rebound or guarding.  Ext: no edema Skin: warm, dry Neuro: grossly normal, moves all extremities, PERRLA, hard of hearing  Diabetic Foot Exam - Simple   Simple Foot Form Diabetic Foot exam was performed with the following findings: Yes 10/28/2021  1:25 PM  Visual Inspection No deformities, no ulcerations, no other skin breakdown bilaterally: Yes Sensation Testing Intact to touch and monofilament testing bilaterally: Yes Pulse Check Posterior Tibialis and Dorsalis pulse intact bilaterally: Yes Comments      Assessment and  Plan  76 y.o. male presenting for annual physical.  Health Maintenance counseling: 1. Anticipatory guidance: Patient counseled regarding regular dental exams -q6 months, eye exams -yearly but needs to schedule,  avoiding smoking and second hand smoke , limiting alcohol to 2 beverages per day , no illicit drugs .   2. Risk factor reduction:  Advised patient of need for regular exercise and diet rich and fruits and vegetables to reduce risk of heart attack and stroke.  Exercise- tries to remain active.  Diet/weight management-some weight loss with Am nausea.  Wt Readings from Last 3 Encounters:  10/28/21 121 lb (54.9 kg)  09/17/21 123 lb (55.8 kg)  09/01/21 123 lb (55.8 kg)  3. Immunizations/screenings/ancillary studies- fall flu shot recommended--high dose. Wants to hold off on shingrix for now- also opts out of covid  Immunization History  Administered Date(s) Administered   Influenza Split 12/30/2010, 11/18/2011   Influenza Whole 12/22/2006, 12/27/2007, 11/05/2009   Influenza, High Dose Seasonal PF 12/26/2015   Influenza,inj,Quad PF,6+ Mos 12/06/2013, 12/17/2016   Influenza-Unspecified 11/01/2014   Pneumococcal Conjugate-13 10/10/2014   Pneumococcal Polysaccharide-23 01/24/2007, 10/24/2012   Td 01/24/2007  4. Prostate cancer screening- low risk prior prostate cancer screening- past age for regular surveillance  Lab Results  Component Value Date   PSA 0.84 07/10/2019   PSA 1.94 12/04/2013   PSA 1.26 04/30/2011   5. Colon cancer screening - 2015- opts out of further unless needs further GI workup 6. Skin cancer screening- plans to schedule follow up. advised regular sunscreen use. Denies worrisome, changing, or new skin lesions.  7. Smoking associated screening (lung cancer screening, AAA screen 65-75, UA)- never smoker 8. STD screening - only active with wife  Status of chronic or acute concerns   #Intermittent nausea- see seoarate bite   #hearing loss- has updated hearing aids  coming in    #Chronic pain managed through pain clinic- also OA- ongoing issues after prior history of radiation for tonsillar cancer-  reports teeth crumbling as well. Still on fentanyl patch and oxycodone 5 mg/5 ml - has pain with eating if doesn't take this and takes up to 10-15 ml - worried these could contribute to possible gastroparesis.   #CAD- medical management only per Dr. Burt Knack. Fatigue issues not thought to be cardiac- may be more deconditioning. May have significant MR- does not appear has been interested in further workup evaluation though that could contribute #hyperlipidemia with aortic atherosclerosis S: Medication:aspirin 81 mg, atorvastatin 20 mg  Lab Results  Component Value Date   CHOL 126 08/14/2021   HDL 44 08/14/2021   LDLCALC 65 08/14/2021   LDLDIRECT 100.0 06/29/2016   TRIG 90 08/14/2021   CHOLHDL 2.9 08/14/2021   A/P: CAD largely asymptomatic- fatigue not thought related to this- MR could contribute. Lipids at goal with LDL under 70 for CAD and aortic atherosclerosis  # Diabetes See separate note from today  #hypertension S: medication: ramipril 5 mg- takes 2 capsules in am, spironolactone 12.5 mg daily BP Readings from Last 3 Encounters:  10/28/21 120/60  09/17/21 136/78  09/01/21 (!) 130/95  A/P: Controlled. Continue current medications.   #Gout S: occasional gout flares since stopping allopurinol over a year ago. He thinks cardiology stopped but I dont see notes of this. Colchicine does help with flares Lab Results  Component Value Date   LABURIC 7.0 03/28/2020  A/P:I suggested restarting his home allopurinol and update uric acid today- suspect still elevated like in past- I'm ok slightly high if not having flares though but having some flares   #hypothyroidism S: compliant On thyroid medication-levothryoxine 100 mcg - takes half tablet one day a week and full tablet other days Lab Results  Component Value Date   TSH 8.93 (H) 06/30/2021   A/P:  Hopefully improved-update TSH today with labs-May need further adjustment-weekly total of 650 mcg of levothyroxine at present  # Depression See separate note  #senile purpura- noted. Stable. Check cbc at least annually  # B12 deficiency S: Current treatment/medication (oral vs. IM): 1000 mcg dissolvable tablet Lab Results  Component Value Date   VITAMINB12 1,202 (H) 03/28/2020  A/P: hopefully stable- update b12 today. Continue current meds for now   Recommended follow up: Return in about 2 months (around 12/28/2021) for followup or sooner if needed.Schedule b4 you leave. Future Appointments  Date Time Provider [place]  12/02/2021 12:00 PM [facility]-MR 1 [facility]  12/12/2021 10:20 AM Johney Frame, Greer Ee, MD [facility]   Lab/Order associations:NOT fasting   ICD-10-CM   1. Preventative health care  Z00.00     2. Idiopathic gout, unspecified chronicity, unspecified site  M10.00     3. Type 2 diabetes mellitus with peripheral neuropathy (HCC)  E11.42     4. Hypothyroidism, unspecified type  E03.9       No orders of the defined types were placed in this encounter.   Return precautions advised.  Garret Reddish, MD

## 2021-10-28 NOTE — Progress Notes (Addendum)
Phone [phone] In person visit   Subjective:   Kevin Mcclain is a 76 y.o. 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) ([place]) 08/19/2021    Priority: High   Moderate to severe mitral regurgitation 08/19/2021    Priority: High   Ascending aortic aneurysm ([place]) 03/25/2016    Priority: High   History of tonsillar cancer 2016- with history of radiation 10/31/2014    Priority: High   Osteoarthritis 11/18/2010    Priority: High   CAD (coronary artery disease) 10/11/2008    Priority: High   Type 2 diabetes mellitus with peripheral neuropathy ([place]) 01/24/2007    Priority: High   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    Priority: Medium    Atherosclerosis of aorta ([place]) 07/10/2019    Priority: Medium    B12 deficiency 01/15/2017    Priority: Medium    BPH associated with nocturia 10/30/2016    Priority: Medium    Hypothyroidism 09/11/2016    Priority: Medium    Depression, major, single episode, moderate ([place]) 04/02/2016    Priority: Medium    Diabetic neuropathy ([place]) 12/26/2015    Priority: Medium    Gout 03/28/2007    Priority: Medium    Hyperlipidemia LDL goal <70 01/24/2007    Priority: Medium    Essential hypertension 01/24/2007    Priority: Medium    Senile purpura ([place]) 03/17/2017    Priority: Low   Herpes zoster 07/15/2015    Priority: Low   GERD (gastroesophageal reflux disease) 06/10/2015    Priority: Low   Arthropathy 03/10/2013    Priority: Low   Low back pain 11/26/2011    Priority: Low   Kidney stone 11/04/2011    Priority: Low    Medications- reviewed and updated Current Outpatient Medications  Medication Sig Dispense Refill   allopurinol (ZYLOPRIM) 100 MG tablet Take 1 tablet (100 mg total) by mouth daily. 90 tablet 3   Ascorbic Acid (VITAMIN C PO) Take 1  tablet by mouth every evening.     aspirin EC 81 MG tablet Take 81 mg by mouth in the morning.     atorvastatin (LIPITOR) 20 MG tablet Take 1 tablet (20 mg total) by mouth daily. 90 tablet 3   colchicine 0.6 MG tablet TAKE 2 TABLETS BY MOUTH AT 1ST SIGN OF GOUT FLARE, MAY REPEAT 1 TABLET IN 2HRS, THEN ONCE DAILY UNTIL SYMPTOMS RESOLVE 90 tablet 1   empagliflozin (JARDIANCE) 10 MG TABS tablet Take 1 tablet (10 mg total) by mouth daily before breakfast. 30 tablet 5   fentaNYL (DURAGESIC) 50 MCG/HR Place 1 patch onto the skin every 3 (three) days. 10 patch 0   levothyroxine (SYNTHROID) 100 MCG tablet Take 1 tablet (100 mcg total) by mouth daily before breakfast. 90 tablet 3   metoprolol succinate (TOPROL-XL) 25 MG 24 hr tablet TAKE 1 TABLET (25 MG TOTAL) BY MOUTH DAILY. TAKE WITH OR IMMEDIATELY FOLLOWING A MEAL. 90 tablet 3   Naphazoline-Glycerin (CLEAR EYES COOLING COMFORT) 0.03-0.5 % SOLN Place 1-2 drops into both eyes 3 (three) times daily as needed (dry/irritated eyes.).     nitroGLYCERIN (NITROSTAT) 0.4 MG SL tablet Place 1 tablet (0.4 mg total) under the tongue every 5 (five) minutes as needed. 50 tablet 3   oxyCODONE (ROXICODONE) 5 MG/5ML solution Take 10-15 mLs (10-15 mg total) by mouth every  4 (four) hours as needed for breakthrough cancer pain. 2500 mL 0   prochlorperazine (COMPAZINE) 10 MG tablet TAKE 1 TABLET BY MOUTH EVERY 6 HOURS AS NEEDED FOR NAUSEA OR VOMITING. 30 tablet 5   ramipril (ALTACE) 5 MG capsule TAKE 2 CAPSULES BY MOUTH EVERY MORNING 180 capsule 3   sertraline (ZOLOFT) 50 MG tablet Take 1 tablet (50 mg total) by mouth daily. 90 tablet 3   sodium fluoride (PREVIDENT 5000 PLUS) 1.1 % CREA dental cream Apply to tooth brush. Brush teeth for 2 minutes. Spit out excess-DO NOT swallow. Repeat nightly. 1 Tube prn   spironolactone (ALDACTONE) 25 MG tablet Take 0.5 tablets (12.5 mg total) by mouth daily. 45 tablet 3   VITAMIN D PO Take 1 tablet by mouth every evening.     No current  facility-administered medications for this visit.     Objective:  BP 120/60   Pulse (!) 58   Temp 98.3 F (36.8 C)   Ht '5\' 5"'$  (1.651 m)   Wt 121 lb (54.9 kg)   SpO2 98%   BMI 20.14 kg/m      Assessment and Plan   # recurrent nausea S: gets nauseous each morning, gets hot and then throws up and gets a chill after that almost daily for last month. Basically missing morning meal because of this. Able to eat later in the day. Feels overall strength is not as good as it has been in the past. Comapazine doesn't help much. No melena or brbpr A/P: new issue over last month - see discussion below- basically going to stop metformin and reduce zoloft then follow up in 2 months- may need GI consult (he wanted to try medication change first)- has seen Dr. Hilarie Fredrickson in the past  -also wonder if narcotic induced gastroparesis or diabetic gastroparesis contributing =certainly can see [place] sooner if not improving  Type 2 diabetes mellitus with peripheral neuropathy ([place]) S: Medication:metformin '500mg'$   for now CBGs- 109-150 peak Lab Results  Component Value Date   HGBA1C 6.3 06/30/2021   HGBA1C 5.6 03/28/2020   HGBA1C 5.8 07/10/2019   A/P: With nausea issues- wonder if these could be related to long term fentanyl and oxycodone but metformin could contribute- lets stop the metformin for 2 weeks and then update me how you are doing- I also sent in a new diabetes medicine called jardiance which is also really good for your heart which I want you to start at about 2 weeks.   Depression, major, single episode, moderate (HCC) S: Medication:sertraline '100mg'$  . Feels like if he misses doses he feels less shaky and more steady -denies depression- biggest concern is nausea issues A/P: depression seems controlled and sertraline can be associated with stomach side effects plus feels he is more shaky on medicine- we will reduce dose to 50 mg and recommend 6-8 week recheck   Recommended follow up: Return in about  2 months (around 12/28/2021) for followup or sooner if needed.Schedule b4 you leave. Future Appointments  Date Time Provider [place]  12/02/2021 12:00 PM MC-MR 1 [facility]  12/12/2021 10:20 AM Freada Bergeron, MD [facility]  12/29/2021 10:20 AM Yong Channel Brayton Mars, MD [facility]   Lab/Order associations:   ICD-10-CM   2. Nausea and vomiting, unspecified vomiting type  R11.2     3. Type 2 diabetes mellitus with peripheral neuropathy (HCC)  E11.42 CBC with Differential/Platelet    Comprehensive metabolic panel    Hemoglobin A1c    4. Major  depressive disorder with single episode, in full remission ([place])  F32.5      Meds ordered this encounter  Medications   empagliflozin (JARDIANCE) 10 MG TABS tablet    Sig: Take 1 tablet (10 mg total) by mouth daily before breakfast.    Dispense:  30 tablet    Refill:  5   sertraline (ZOLOFT) 50 MG tablet    Sig: Take 1 tablet (50 mg total) by mouth daily.    Dispense:  90 tablet    Refill:  3    Return precautions advised.  Garret Reddish, MD

## 2021-10-28 NOTE — Patient Instructions (Addendum)
Health Maintenance Due  Topic Date Due   OPHTHALMOLOGY EXAM -please schedule and have report sent to [place] at [phone] Never done   INFLUENZA VACCINE -let us know when you get this (high dose)- can call back in a month if you prefer to get here -new covid shot available in October- pharmacy will have -Please check with your pharmacy to see if they have the shingrix vaccine. If they do- please get this immunization and update [place] by phone call or mychart with dates you receive the vaccine  09/30/2021   With nausea issues- wonder if these could be related to long term fentanyl and oxycodone but metformin could contribute- lets stop the metformin for 2 weeks and then update me how you are doing- I also sent in a new diabetes medicine called jardiance which is also really good for your heart which I want you to start at about 2 weeks.   depression seems controlled and sertraline can be associated with stomach side effects- we will reduce dose to 50 mg and recommend 6-8 week recheck  Restart allopurinol '100mg'$ - to prevent gout flares- but I'm ok with you waiting about a month to start so we dont make too many changes at once  Please stop by lab before you go If you have mychart- we will send your results within 3 business days of [place] receiving them.  If you do not have mychart- we will call you about results within 5 business days of [place] receiving them.  *please also note that you will see labs on mychart as soon as they post. I will later go in and write notes on them- will say "notes from Dr. Yong Channel"   Recommended follow up: Return in about 2 months (around 12/28/2021) for followup or sooner if needed.Schedule b4 you leave.

## 2021-10-28 NOTE — Assessment & Plan Note (Signed)
S: Medication:sertraline '100mg'$  . Feels like if he misses doses he feels less shaky and more steady -denies depression- biggest concern is nausea issues A/P: depression seems controlled and sertraline can be associated with stomach side effects plus feels he is more shaky on medicine- we will reduce dose to 50 mg and recommend 6-8 week recheck

## 2021-10-28 NOTE — Assessment & Plan Note (Signed)
S: Medication:metformin '500mg'$   for now CBGs- 109-150 peak Lab Results  Component Value Date   HGBA1C 6.3 06/30/2021   HGBA1C 5.6 03/28/2020   HGBA1C 5.8 07/10/2019   A/P: With nausea issues- wonder if these could be related to long term fentanyl and oxycodone but metformin could contribute- lets stop the metformin for 2 weeks and then update me how you are doing- I also sent in a new diabetes medicine called jardiance which is also really good for your heart which I want you to start at about 2 weeks.

## 2021-10-29 LAB — VITAMIN B12: Vitamin B-12: 845 pg/mL (ref 211–911)

## 2021-10-29 LAB — CBC WITH DIFFERENTIAL/PLATELET
Basophils Absolute: 0 10*3/uL (ref 0.0–0.1)
Basophils Relative: 0.5 % (ref 0.0–3.0)
Eosinophils Absolute: 0.3 10*3/uL (ref 0.0–0.7)
Eosinophils Relative: 4.1 % (ref 0.0–5.0)
HCT: 41.3 % (ref 39.0–52.0)
Hemoglobin: 14.1 g/dL (ref 13.0–17.0)
Lymphocytes Relative: 15 % (ref 12.0–46.0)
Lymphs Abs: 1 10*3/uL (ref 0.7–4.0)
MCHC: 34.1 g/dL (ref 30.0–36.0)
MCV: 84.8 fl (ref 78.0–100.0)
Monocytes Absolute: 0.5 10*3/uL (ref 0.1–1.0)
Monocytes Relative: 7.6 % (ref 3.0–12.0)
Neutro Abs: 5 10*3/uL (ref 1.4–7.7)
Neutrophils Relative %: 72.8 % (ref 43.0–77.0)
Platelets: 208 10*3/uL (ref 150.0–400.0)
RBC: 4.87 Mil/uL (ref 4.22–5.81)
RDW: 15.1 % (ref 11.5–15.5)
WBC: 6.9 10*3/uL (ref 4.0–10.5)

## 2021-10-29 LAB — COMPREHENSIVE METABOLIC PANEL
ALT: 22 U/L (ref 0–53)
AST: 21 U/L (ref 0–37)
Albumin: 4.1 g/dL (ref 3.5–5.2)
Alkaline Phosphatase: 68 U/L (ref 39–117)
BUN: 18 mg/dL (ref 6–23)
CO2: 27 mEq/L (ref 19–32)
Calcium: 9.5 mg/dL (ref 8.4–10.5)
Chloride: 102 mEq/L (ref 96–112)
Creatinine, Ser: 1.08 mg/dL (ref 0.40–1.50)
GFR: 65.86 mL/min (ref >60.00)
Glucose, Bld: 116 mg/dL — ABNORMAL HIGH (ref 70–99)
Potassium: 4 mEq/L (ref 3.5–5.1)
Sodium: 138 mEq/L (ref 135–145)
Total Bilirubin: 1.3 mg/dL — ABNORMAL HIGH (ref 0.2–1.2)
Total Protein: 7.1 g/dL (ref 6.0–8.3)

## 2021-10-29 LAB — URIC ACID: Uric Acid, Serum: 7.9 mg/dL — ABNORMAL HIGH (ref 4.0–7.8)

## 2021-10-29 LAB — TSH: TSH: 0.51 u[IU]/mL (ref 0.35–5.50)

## 2021-10-29 LAB — HEMOGLOBIN A1C: Hgb A1c MFr Bld: 6.6 % — ABNORMAL HIGH (ref 4.6–6.5)

## 2021-11-10 ENCOUNTER — Ambulatory Visit: Payer: Medicare HMO | Admitting: Family Medicine

## 2021-11-13 ENCOUNTER — Ambulatory Visit: Payer: Medicare HMO | Admitting: Family Medicine

## 2021-11-14 ENCOUNTER — Other Ambulatory Visit (HOSPITAL_COMMUNITY): Payer: Self-pay

## 2021-11-17 ENCOUNTER — Telehealth: Payer: Self-pay

## 2021-11-17 ENCOUNTER — Other Ambulatory Visit (HOSPITAL_COMMUNITY): Payer: Self-pay

## 2021-11-17 MED ORDER — FENTANYL 50 MCG/HR TD PT72
1.0000 | MEDICATED_PATCH | TRANSDERMAL | 0 refills | Status: DC
  Filled 2021-11-17: qty 10, 30d supply, fill #0

## 2021-11-17 MED ORDER — OXYCODONE HCL 5 MG/5ML PO SOLN
10.0000 mg | ORAL | 0 refills | Status: DC | PRN
  Filled 2021-11-17: qty 2500, 30d supply, fill #0

## 2021-11-17 NOTE — Telephone Encounter (Signed)
I received this message from Mabeline Caras via secured chat:  "Is there a reason you discontinued Mr. Lenoir fentanyl and oxycodone on 10-28-21? He needs it and the Dr. that prescribed it will not send in another refill. Please get these rxs reinstated for him asap".  Ok to send in under your name?

## 2021-11-17 NOTE — Telephone Encounter (Signed)
Caller states: -She is confused as to why patient's medication refill has been sent to PCP office.  - She knows PCP prefers to not handle pain medication and she respects this  - Dr. Maryjean Ka handles patient's pain medication, fentanyl and oxycodone    Caller requests: - Medication management go back to Dr. Maryjean Ka who is apart of patient's oncology team

## 2021-11-17 NOTE — Telephone Encounter (Signed)
Kevin Mcclain, CMA spoke with pt's wife who stated that she knows that RX shouldn't come to Dr. Ansel Bong office for refills and should go to Dr. Maryjean Ka [facility]'s office. Kevin Mcclain, would you please help this patient get his Fentanyl and oxycodone Rx refills.

## 2021-11-18 ENCOUNTER — Other Ambulatory Visit (HOSPITAL_COMMUNITY): Payer: Self-pay

## 2021-11-21 ENCOUNTER — Ambulatory Visit: Payer: Medicare HMO | Admitting: Family Medicine

## 2021-11-28 ENCOUNTER — Telehealth (HOSPITAL_COMMUNITY): Payer: Self-pay | Admitting: Emergency Medicine

## 2021-11-28 NOTE — Telephone Encounter (Signed)
Attempted to call patient regarding upcoming cardiac MR appointment. Left message on voicemail with name and callback number Azaleah Usman RN Navigator Cardiac Imaging [facility] [phone] Office [phone] Cell  

## 2021-12-02 ENCOUNTER — Ambulatory Visit (HOSPITAL_COMMUNITY): Payer: Medicare HMO | Attending: Cardiology

## 2021-12-10 ENCOUNTER — Ambulatory Visit (INDEPENDENT_AMBULATORY_CARE_PROVIDER_SITE_OTHER): Payer: Medicare HMO | Admitting: Family Medicine

## 2021-12-10 ENCOUNTER — Encounter: Payer: Self-pay | Admitting: Family Medicine

## 2021-12-10 VITALS — BP 140/60 | HR 56 | Temp 97.2°F | Ht 65.0 in | Wt 124.0 lb

## 2021-12-10 DIAGNOSIS — E039 Hypothyroidism, unspecified: Secondary | ICD-10-CM

## 2021-12-10 DIAGNOSIS — E1142 Type 2 diabetes mellitus with diabetic polyneuropathy: Secondary | ICD-10-CM

## 2021-12-10 DIAGNOSIS — R1110 Vomiting, unspecified: Secondary | ICD-10-CM | POA: Diagnosis not present

## 2021-12-10 DIAGNOSIS — R110 Nausea: Secondary | ICD-10-CM | POA: Diagnosis not present

## 2021-12-10 DIAGNOSIS — I10 Essential (primary) hypertension: Secondary | ICD-10-CM

## 2021-12-10 MED ORDER — NITROGLYCERIN 0.4 MG SL SUBL: 0.4000 mg | SUBLINGUAL_TABLET | SUBLINGUAL | 3 refills | Status: DC | PRN

## 2021-12-10 MED ORDER — BLOOD GLUCOSE MONITOR KIT: PACK | 0 refills | Status: DC

## 2021-12-10 NOTE — Patient Instructions (Addendum)
Please call and schedule diabetic eye exam.  [facility] contact Please call to schedule visit and/or procedure Address: [address], [place], [place] [address] Phone: [phone]   Please stop by lab before you go- just for urine If you have mychart- we will send your results within 3 business days of [place] receiving them.  If you do not have mychart- we will call you about results within 5 business days of [place] receiving them.  *please also note that you will see labs on mychart as soon as they post. I will later go in and write notes on them- will say "notes from Dr. Yong Channel"    Recommended follow up: -stop by desk and cancel visit on 12/29/21- 2 month follow up- schedule before you leave

## 2021-12-10 NOTE — Progress Notes (Signed)
Phone [phone] In person visit   Subjective:   Kevin Mcclain is a 76 y.o. 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 10/31/2014    Priority: High   Osteoarthritis 11/18/2010    Priority: High   CAD (coronary artery disease) 10/11/2008    Priority: High   Type 2 diabetes mellitus with peripheral neuropathy ([place]) 01/24/2007    Priority: High   Drug-induced polyneuropathy ([place]) 07/10/2019    Priority: Medium    Atherosclerosis of aorta ([place]) 07/10/2019    Priority: Medium    B12 deficiency 01/15/2017    Priority: Medium    BPH associated with nocturia 10/30/2016    Priority: Medium    Hypothyroidism 09/11/2016    Priority: Medium    Depression, major, single episode, moderate ([place]) 04/02/2016    Priority: Medium    Diabetic neuropathy ([place]) 12/26/2015    Priority: Medium    Gout 03/28/2007    Priority: Medium    Hyperlipidemia LDL goal <70 01/24/2007    Priority: Medium    Essential hypertension 01/24/2007    Priority: Medium    Senile purpura ([place]) 03/17/2017    Priority: Low   Herpes zoster 07/15/2015    Priority: Low   GERD (gastroesophageal reflux disease) 06/10/2015    Priority: Low   Arthropathy 03/10/2013    Priority: Low   Low back pain 11/26/2011    Priority: Low   Kidney stone 11/04/2011    Priority: Low    Medications- reviewed and  updated Current Outpatient Medications  Medication Sig Dispense Refill   Ascorbic Acid (VITAMIN C PO) Take 1 tablet by mouth every evening.     aspirin EC 81 MG tablet Take 81 mg by mouth in the morning.     atorvastatin (LIPITOR) 20 MG tablet Take 1 tablet (20 mg total) by mouth daily. 90 tablet 3   blood glucose meter kit and supplies KIT Dispense based on patient and insurance preference. Use up to four times daily as directed. 1 each 0   colchicine 0.6 MG tablet TAKE 2 TABLETS BY MOUTH AT 1ST SIGN OF GOUT FLARE, MAY REPEAT 1 TABLET IN 2HRS, THEN ONCE DAILY UNTIL SYMPTOMS RESOLVE 90 tablet 1   fentaNYL (DURAGESIC) 50 MCG/HR Place 1 patch onto the skin every 72 hours 10 patch 0   levothyroxine (SYNTHROID) 100 MCG tablet Take 1 tablet (100 mcg total) by mouth daily before breakfast. 90 tablet 3   metoprolol succinate (TOPROL-XL) 25 MG 24 hr tablet TAKE 1 TABLET (25 MG TOTAL) BY MOUTH DAILY. TAKE WITH OR IMMEDIATELY FOLLOWING A MEAL. 90 tablet 3   Naphazoline-Glycerin (CLEAR EYES COOLING COMFORT) 0.03-0.5 % SOLN Place 1-2 drops into both eyes 3 (three) times daily as needed (dry/irritated eyes.).     oxyCODONE (ROXICODONE) 5 MG/5ML solution Take 10-15 mLs (10-15 mg total) by mouth every 4 (four) hours  as needed for breakthrough cancer pain. 2500 mL 0   ramipril (ALTACE) 5 MG capsule TAKE 2 CAPSULES BY MOUTH EVERY MORNING 180 capsule 3   spironolactone (ALDACTONE) 25 MG tablet Take 0.5 tablets (12.5 mg total) by mouth daily. 45 tablet 3   VITAMIN D PO Take 1 tablet by mouth every evening.     nitroGLYCERIN (NITROSTAT) 0.4 MG SL tablet Place 1 tablet (0.4 mg total) under the tongue every 5 (five) minutes as needed. 25 tablet 3   No current facility-administered medications for this visit.     Objective:  BP (!) 140/60   Pulse (!) 56   Temp (!) 97.2 F (36.2 C)   Ht '5\' 5"'  (1.651 m)   Wt 124 lb (56.2 kg)   SpO2 100%   BMI 20.63 kg/m  Gen: NAD, resting comfortably CV: RRR no murmurs rubs  or gallops Lungs: CTAB no crackles, wheeze, rhonchi Abdomen: soft/nontender/nondistended/normal bowel sounds. No rebound or guarding.  Ext: minimal edema Skin: warm, dry Neuro: Hard of hearing    Assessment and Plan   #Recurrent Nausea- now with vomiting S: From last visit "S: gets nauseous each morning, gets hot and then throws up and gets a chill after that almost daily for last month. Basically missing morning meal because of this. Able to eat later in the day. Feels overall strength is not as good as it has been in the past. Comapazine doesn't help much. No melena or brbpr A/P: new issue over last month - see discussion below- basically going to stop metformin and reduce zoloft then follow up in 2 months- may need GI consult (he wanted to try medication change first)- has seen Dr. Hilarie Fredrickson in the past  -also wonder if narcotic induced gastroparesis or diabetic gastroparesis contributing =certainly can see [place] sooner if not improving"  Today:  -Plan was to stop metformin for 2 weeks and then update [place] and we sent in a new medication Jardiance as an alternate. He did not start jardiance and has remained off metformin- did not note any improvement in nausea symtpoms - Depression was controlled so we opted to reduce sertraline to 50 mg from 100 mg- that also did not help. He opted to stop completely. Feels more stable -still on narcotics which could cause gastroparesis -was throwing up most mornings when woke up- pepcid has helped OTC -has felt like compazine/prochlorperazine actually worsens symptoms -reports required esophageal dilation int he past -also choking sensation at night -burning sensation when comes up- no melena or BRBPR. No hematemsis  or coffee ground emesis -no abdominal pain - wife wonders if sugar low as irritable with this at times A/P: Patient with recurrent episodes of nausea at last visit-he wanted to try medication changes and we took him off of metformin and reduced  Zoloft (which he on his own decided to fully stop) - Unfortunate this is now progressed to recurrent episodes of vomiting nonbilious nonbloody -Wife also reports some irritability-we will check blood sugar in case having hypoglycemia with Nausea and vomiting and difficulty keeping down food-with that being said he thankfully has gained 3 pounds from last visit and does not appear to be fluid - Reports history of needing a dilation in the past of his esophagus as well as sensation of food getting stuck/choking at times-wonder if needs repeat endoscopy or even potential ENT evaluation -I wonder about-paresis with long-term narcotics as well as diabetes -He feels like symptoms improved on Pepcid and he can continue that -Recommended updating  blood work but patient left prior to this being obtained-team will work to get him back for this  # Diabetes S: Medication: Plan was for [employer] but he never started but he did stop metformin 5 mg daily CBGs-needs new meter Lab Results  Component Value Date   HGBA1C 6.6 (H) 10/28/2021   HGBA1C 6.3 06/30/2021   HGBA1C 5.6 03/28/2020    A/P: We opted for 58-monthfollow-up and remain off medicine especially in light of ongoing issues with nausea and vomiting and want to avoid adding medication-May need to add medication at next visit  #hyperlipidemia #CAD S: Medication: atorvastatin 20 mg, aspirin 81 mg (stopped and no throwing up- 5 days without and restarted last night without vomiting) -Denies chest pain or shortness of breath Lab Results  Component Value Date   CHOL 126 08/14/2021   HDL 44 08/14/2021   LDLCALC 65 08/14/2021   LDLDIRECT 100.0 06/29/2016   TRIG 90 08/14/2021   CHOLHDL 2.9 08/14/2021   A/P: Cholesterols been well controlled at ideal goal with LDL under 70-even pursuing with CAD history.  CAD asymptomatic-encouraged him to continue aspirin though  #hypothyroidism S: compliant On thyroid medication- levothyroxine 100 mcg Lab Results   Component Value Date   TSH 0.51 10/28/2021    A/P: Has been well controlled-continue current medication  #hypertension S: medication: metoprolol 25 mg daily XR,r amipril 10 mg, spironolactone 12.5 mg BP Readings from Last 3 Encounters:  12/10/21 (!) 140/60  10/28/21 120/60  09/17/21 136/78  A/P: Very mild elevation but seems somewhat frustrated with his symptoms today-consider adjustment if persistently elevated at follow-up  #Gout S: prefers not to take allopurinol but does increase gout risk. Prefers colchicine as needed- sparing use Lab Results  Component Value Date   LABURIC 7.9 (H) 10/28/2021   A/P: Less than ideal control but he wants to avoid taking allopurinol-continue current medication  Recommended follow up: -stop by desk and cancel visit on 12/29/21- 2 month follow up- schedule before you leave Future Appointments  Date Time Provider DBellaire 12/12/2021 10:20 AM PFreada Bergeron MD [facility]  02/10/2022  2:00 PM HMarin Olp MD [facility]    Lab/Order associations:   ICD-10-CM   1. Nausea  R11.0 Ambulatory referral to Gastroenterology    CBC with Differential/Platelet    Comprehensive metabolic panel    CANCELED: Ambulatory referral to Gastroenterology    2. Recurrent vomiting  R11.10 Ambulatory referral to Gastroenterology    CBC with Differential/Platelet    Comprehensive metabolic panel    CANCELED: Ambulatory referral to Gastroenterology    3. Type 2 diabetes mellitus with peripheral neuropathy (HCC)  E11.42 Microalbumin / creatinine urine ratio    CBC with Differential/Platelet    Comprehensive metabolic panel    4. Hypothyroidism, unspecified type  E03.9     5. Essential hypertension  I10       Meds ordered this encounter  Medications   nitroGLYCERIN (NITROSTAT) 0.4 MG SL tablet    Sig: Place 1 tablet (0.4 mg total) under the tongue every 5 (five) minutes as needed.    Dispense:  25 tablet    Refill:  3    blood glucose meter kit and supplies KIT    Sig: Dispense based on patient and insurance preference. Use up to four times daily as directed.    Dispense:  1 each    Refill:  0    Glucometer per insurance and patient preference    Order Specific Question:  Number of strips    Answer:   100    Order Specific Question:   Number of lancets    Answer:   100    Return precautions advised.  Garret Reddish, MD

## 2021-12-10 NOTE — Progress Notes (Deleted)
Cardiology Office Note:    Date:  12/10/2021   ID:  Kevin Mcclain, DOB 12-15-1945, MRN [id number]  PCP:  Marin Olp, MD   [facility] Providers Cardiologist:  Freada Bergeron, MD {  Referring MD: Marin Olp, MD    History of Present Illness:    Kevin Mcclain is a 76 y.o. male with a hx of multiple Mis in the past, CAD s/p CABG in 2005, HTN, HLD, DMII, throat cancer s/p XRT, and GERD who presents to clinic for follow-up.    Saw Dr. Yong Channel on 06/30/21 with 2 month history of chest pain and associated diaphoresis. Given his history of known CAD with prior CABG x6 in 2005, he was referred urgently to Cardiology for further evaluation.  Was initially seen in clinic by me on 06/2021 for chest pain, SOB and decreased exercise tolerance. He was also noted to have a loud systolic murmur on exam. TTE 06/2021 showed EF 45050%, G2DD, inferior akinesis, moderate pulmonary HTN, moderate-to-severe MR, mild-mod AI. Cardiac PET with anterior infarct with peri-infarct ischemia and drop in EF with stress. Cath 08/2021 with severe native vessel disease, patent LIMA-LAD and SVG to OM and occlusion of SVG-PDA and PLA and occlusion of SVG-D1. RHC with moderate pulmonary HTN with mPAP 20mHg, V wave 210mg. CMR is pending.   Was last seen in clinic on 08/2021 where he was feeling better. He wanted to hold off on further testing at that time.   Today, ***  Past Medical History:  Diagnosis Date   Allergy    Arthritis    Ascending aortic aneurysm ([facility]1/24/2018   03/23/16 CTA chest Mildly aneurysmal ascending aorta at 4.0 cm.  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    Hyperlipidemia    Hyperplastic colon polyp    Hypertension    Kidney stones    Moderate to severe mitral regurgitation 08/19/2021   Echocardiogram 06/2021: EF 45-50, mod to severe MR   Rheumatoid arthritis ([place]   Shingles outbreak 04/23/15   left shoulder and left arm   Vertigo     Past Surgical History:  Procedure Laterality Date   CHOLECYSTECTOMY  2012   COLONOSCOPY W/ POLYPECTOMY  2015   CORONARY ARTERY BYPASS GRAFT  2005   6 grafts   CYSTOSCOPY     kidney stone removal   IR GASTROSTOMY TUBE REMOVAL  06/25/2016   LITHOTRIPSY     MULTIPLE EXTRACTIONS WITH ALVEOLOPLASTY N/A 11/14/2014   Procedure: Extraction of tooth #2 and 31 with alveoloplasty after sectioning of bridge at distal #29 with full mouth debridement of remaining dention;  Surgeon: RoLenn CalDDS;  [facility] [facility] [place] [facility];  Laterality: N/A;   NASAL SINUS SURGERY Right 11/14/2014   Procedure: RIGHT ENDOSCOPIC ANTROSTOMY AND ANTERIOR ETHMOIDECTOMY ;  Surgeon: Jodi Marble, MD;  Location: [place];  Service: ENT;  Laterality: Right;   RIGHT/LEFT HEART CATH AND CORONARY/GRAFT ANGIOGRAPHY N/A 09/01/2021   Procedure: RIGHT/LEFT HEART CATH AND CORONARY/GRAFT ANGIOGRAPHY;  Surgeon: Sherren Mocha, MD;  Location: [place] [facility];  Service: Cardiovascular;  Laterality: N/A;    Current Medications: No outpatient medications have been marked as taking for the 12/12/21 encounter (Appointment) with Freada Bergeron, MD.     Allergies:   Codeine, Sitagliptin phosphate, and Tessalon [benzonatate]   Social History   Socioeconomic History   Marital status: Married    Spouse name: Not on  file   Number of children: Not on file   Years of education: Not on file   Highest education level: Not on file  Occupational History   Occupation: Retired   Tobacco Use   Smoking status: Never   Smokeless tobacco: Never  Vaping Use   Vaping Use: Never used  Substance and Sexual Activity   Alcohol use: No   Drug use: No   Sexual activity: Not on file  Other Topics Concern   Not on file  Social History Narrative   Married 35 years in October 2017.       Occupation :  Retired Arboriculturist for [employer].  Worked for [employer]: garden and work in [place] Strain: [place]  (07/18/2021)   Overall Financial Resource Strain (CARDIA)    Difficulty of Paying Living Expenses: Not hard at all  Food Insecurity: No Food Insecurity (07/18/2021)   Hunger Vital Sign    Worried About Running Out of Food in the Last Year: Never true    [place] in the Last Year: Never true  Transportation Needs: No Transportation Needs (07/18/2021)   PRAPARE - Hydrologist (Medical): No    Lack of Transportation (Non-Medical): No  Physical Activity: Inactive (07/18/2021)   Exercise Vital Sign    Days of Exercise per Week: 0 days    Minutes of Exercise per Session: 0 min  Stress: No Stress Concern Present (07/18/2021)   [place]    Feeling of Stress : Not at all  Social Connections: Moderately Isolated (07/18/2021)   Social Connection and Isolation Panel [NHANES]    Frequency of Communication with Friends and Family: Never    Frequency of Social Gatherings with Friends and Family: Twice a week    Attends Religious Services: 1 to 4 times per year    Active Member of [employer] or Organizations: No    Attends Music therapist: Never    Marital Status: Married     Family History: The patient's family history includes CAD  in his sister; Colon cancer in his father; Coronary artery disease (age of onset: 58) in his brother; Heart attack in his mother; Heart disease in his father; Lymphoma in his sister; Stroke in his father; Stroke (age of onset: 25) in his sister.  ROS:   Please see the history of present illness.    Review of Systems  Constitutional:  Positive for malaise/fatigue.  Respiratory:  Positive for shortness of breath.   Cardiovascular:  Negative for chest pain, palpitations, orthopnea, claudication, leg swelling and PND.  Gastrointestinal:  Negative for nausea and vomiting.  Genitourinary:  Negative for hematuria.  Musculoskeletal:  Negative for falls.  Neurological:  Negative  for dizziness and loss of consciousness.     EKGs/Labs/Other Studies Reviewed:    The following studies were reviewed today: Cath 09/01/21:   Mid RCA to Dist RCA lesion is 100% stenosed.   Ost LAD to Prox LAD lesion is 100% stenosed.   Ost Cx to Prox Cx lesion is 100% stenosed.   Origin lesion before 1st RPL  is 100% stenosed.   Origin to Prox Graft lesion is 100% stenosed.   LIMA.   Seq SVG- PDA and PLA.   Severe native three-vessel coronary artery disease with total occlusion of the LAD, left circumflex, and RCA Status post aortocoronary bypass surgery with continued patency of the LIMA to LAD and bypass graft to OM, both vessels supplying collaterals to the PDA, PLA, and diagonal branch of the LAD Moderate pulmonary hypertension with mean PA pressure 33 mmHg, mean wedge pressure 23 mmHg, V wave 29 mmHg.  Preserved cardiac output.  Hemodynamic findings could be consistent with hemodynamically significant mitral regurgitation.  NM PET CT CARDIAC PERFUSION MULTI W/ABSOLUTE BLOODFLOW 08/12/2021   Findings are consistent with prior myocardial infarction with peri-infarct ischemia. The study is high risk due to presence of anterior infarct with peri-infarct ischemia, drop in EF with stress, and moderately reduced LVEF.  Recommend cath for further evaluation.   LV perfusion is abnormal. There is evidence of infarction with peri-infarct ischemia. There is a medium defect with severe reduction in uptake present in the apical to basal anterior and apex location(s) that is partially reversible. There is abnormal wall motion in the defect area. Consistent with peri-infarct ischemia. Defect 2: There is a small defect with mild reduction in uptake present in the basal inferior location(s) that is partially reversible. There is abnormal wall motion in the defect area. Consistent with peri-infarct ischemia.   Rest left ventricular function is abnormal. Rest global function is moderately reduced. There were multiple regional abnormalities. Rest EF: 32 %. Stress left ventricular function is abnormal. Stress global function is moderately reduced. There were multiple regional abnormalities. Stress EF: 30 %. End diastolic cavity size is severely enlarged. TID is not present.   Myocardial blood flow reserve is not reported in this patient as patient is s/p CABG which affects diagnostic accuracy.   Patient is s/p CABG. There is severe native vessel coronary calcium present on the attenuation correction CT images. Coronary calcifications were present in the left anterior descending artery, left circumflex artery and right coronary artery distribution(s).   NONCARDIAC IMPRESSION: 1. Moderate right and small left pleural effusions, both slightly reduced compared 07/17/2021. 2.  Aortic Atherosclerosis (ICD10-I70.0). 3. Pneumobilia. 4. Thoracic spondylosis.   ECHO COMPLETE WO IMAGING ENHANCING AGENT 07/04/2021 EF 45-50, GRII DD, inferior AK, normal RVSF, moderately elevated PASP (RVSP 52.3), moderate-severe mitral regurgitation, mild-moderate AI   Chest CTA 07/17/2021 IMPRESSION: 1. No evidence of pulmonary embolism. 2. Large right and small left pleural effusions with associated atelectasis. Smooth septal line thickening and  ground-glass opacities, right greater than left, are suggestive of pulmonary edema. 3. Pneumobilia is partially imaged. 4. Ascending aorta measuring 4.1 cm. Recommend follow-up every 12 months and vascular consultation. This recommendation follows ACR consensus guidelines: White Paper of the ACR Incidental Findings Committee II on Vascular Findings. J Am Coll Radiol 2013; 10:789-794. Aortic Atherosclerosis (ICD10-I70.0).  EKG:  EKG: no new tracing  Recent Labs: 10/28/2021: ALT 22; BUN 18; Creatinine, Ser 1.08; Hemoglobin 14.1; Platelets 208.0; Potassium 4.0; Sodium 138; TSH 0.51  Recent Lipid Panel    Component Value Date/Time  CHOL 126 08/14/2021 0853   TRIG 90 08/14/2021 0853   HDL 44 08/14/2021 0853   CHOLHDL 2.9 08/14/2021 0853   CHOLHDL 3 07/10/2019 1631   VLDL 27.4 07/10/2019 1631   LDLCALC 65 08/14/2021 0853   LDLDIRECT 100.0 06/29/2016 1503     Risk Assessment/Calculations:           Physical Exam:    VS:  There were no vitals taken for this visit.    Wt Readings from Last 3 Encounters:  12/10/21 124 lb (56.2 kg)  10/28/21 121 lb (54.9 kg)  09/17/21 123 lb (55.8 kg)     GEN:  Elderly male, comfortable HEENT: Normal NECK: No JVD; No carotid bruits CARDIAC: RRR, 3/6 holosystolic murmur best heard at the apex RESPIRATORY:  Clear to auscultation without rales, wheezing or rhonchi  ABDOMEN: Soft, non-tender, non-distended MUSCULOSKELETAL:  No edema; No deformity  SKIN: Warm and dry NEUROLOGIC:  Alert and oriented x 3 PSYCHIATRIC:  Normal affect   ASSESSMENT:    No diagnosis found.  PLAN:    In order of problems listed above:  #Chest Pain: #CAD s/p CABG in 2005: Patient with extensive history of CAD with prior MI x3. Ultimately underwent CABG in 2005 at [facility] reportedly following cath where there was plaque embolization requiring urgent intervention. Cath with severe native vessel disease, patent LIMA-LAD, patent SVG-OM, occluded SVG-D1, occluded  sequential PDA-PLA. Recommended for continued medical therapy. -Continue ASA '81mg'$  daily -Continue lipitor '20mg'$  daily -Continue metoprolol '25mg'$  XL daily -Continue ramipril '5mg'$  daily  #Moderate-to-Severe MR: TTE with posterior leaflet tethering and moderate to severe MR with large v-waves on cath. Will plan for CMR to further evaluate severity. Not pursuing TEE at this time due to history of neck radiation and difficulty swallowing.  -Check CMR -Will bring back in 3 months to discuss results -Can consider referral to structural pending work-up  #Chronic Systolic HF: LVEF 54-27% with inferior hypokinesis. Currently euvolemic and compensated on exam. -Continue metoprolol '25mg'$  XL daily -Continue ramipril '5mg'$  daily -Continue spiro 12.'5mg'$  daily -Can add farxiga in future but patient would like to titrate slowly  #Mild-to-moderate AI: -Continue serial monitoring with TTEs   #HTN: Controlled at home.  -Continue metoprolol '25mg'$  XL daily -Continue ramipril '5mg'$  daily -Add spiro 12.'5mg'$  daily as above  #HLD: -Continue lipitor '20mg'$  daily -LDL 65 in 07/2020  #Ascending Aortic Aneurysm: 72m on CT chest in 2018. -Follow with TTEs/CTA  #Dysphagia: #History of Esophageal Stricture: #Vomiting: Patient with history of esophageal stricture requiring dilation in the past. Symptoms have improved.  -Follow-up with GI      Shared Decision Making/Informed Consent The risks [chest pain, shortness of breath, cardiac arrhythmias, dizziness, blood pressure fluctuations, myocardial infarction, stroke/transient ischemic attack, nausea, vomiting, allergic reaction, radiation exposure, metallic taste sensation and life-threatening complications (estimated to be 1 in 10,000)], benefits (risk stratification, diagnosing coronary artery disease, treatment guidance) and alternatives of a cardiac PET stress test were discussed in detail with Mr. PWortonand he agrees to proceed.    Medication  Adjustments/Labs and Tests Ordered: Current medicines are reviewed at length with the patient today.  Concerns regarding medicines are outlined above.  No orders of the defined types were placed in this encounter.  No orders of the defined types were placed in this encounter.   There are no Patient Instructions on file for this visit.    Signed, HFreada Bergeron MD  12/10/2021 4:38 PM    [place]

## 2021-12-11 ENCOUNTER — Other Ambulatory Visit (HOSPITAL_COMMUNITY): Payer: Self-pay

## 2021-12-11 DIAGNOSIS — C099 Malignant neoplasm of tonsil, unspecified: Secondary | ICD-10-CM | POA: Diagnosis not present

## 2021-12-11 DIAGNOSIS — R69 Illness, unspecified: Secondary | ICD-10-CM | POA: Diagnosis not present

## 2021-12-11 DIAGNOSIS — M5416 Radiculopathy, lumbar region: Secondary | ICD-10-CM | POA: Diagnosis not present

## 2021-12-11 DIAGNOSIS — F1120 Opioid dependence, uncomplicated: Secondary | ICD-10-CM | POA: Diagnosis not present

## 2021-12-11 DIAGNOSIS — R52 Pain, unspecified: Secondary | ICD-10-CM | POA: Diagnosis not present

## 2021-12-11 MED ORDER — FENTANYL 50 MCG/HR TD PT72
1.0000 | MEDICATED_PATCH | TRANSDERMAL | 0 refills | Status: DC
  Filled 2021-12-16: qty 10, 30d supply, fill #0

## 2021-12-11 MED ORDER — OXYCODONE HCL 5 MG/5ML PO SOLN
10.0000 mg | ORAL | 0 refills | Status: DC | PRN
  Filled 2021-12-16: qty 2500, 30d supply, fill #0

## 2021-12-11 MED ORDER — OXYCODONE HCL 5 MG/5ML PO SOLN: 10.0000 mg | ORAL | 0 refills | Status: DC | PRN

## 2021-12-11 MED ORDER — FENTANYL 50 MCG/HR TD PT72
1.0000 | MEDICATED_PATCH | TRANSDERMAL | 0 refills | Status: DC
  Filled 2022-01-15: qty 10, 30d supply, fill #0

## 2021-12-11 MED ORDER — OXYCODONE HCL 5 MG/5ML PO SOLN
10.0000 mg | ORAL | 0 refills | Status: DC | PRN
  Filled 2022-01-15: qty 2500, 30d supply, fill #0

## 2021-12-11 MED ORDER — FENTANYL 50 MCG/HR TD PT72: 1.0000 | MEDICATED_PATCH | TRANSDERMAL | 0 refills | Status: DC

## 2021-12-12 ENCOUNTER — Ambulatory Visit: Payer: Medicare HMO | Admitting: Cardiology

## 2021-12-15 ENCOUNTER — Other Ambulatory Visit (HOSPITAL_COMMUNITY): Payer: Self-pay

## 2021-12-16 ENCOUNTER — Other Ambulatory Visit: Payer: Self-pay

## 2021-12-16 ENCOUNTER — Other Ambulatory Visit (HOSPITAL_COMMUNITY): Payer: Self-pay

## 2021-12-16 DIAGNOSIS — E1142 Type 2 diabetes mellitus with diabetic polyneuropathy: Secondary | ICD-10-CM

## 2021-12-17 ENCOUNTER — Telehealth: Payer: Self-pay | Admitting: Family Medicine

## 2021-12-17 NOTE — Telephone Encounter (Signed)
Called 3x & LVM to schedule labs since 12/10/21.

## 2021-12-29 ENCOUNTER — Ambulatory Visit: Payer: Medicare HMO | Admitting: Family Medicine

## 2022-01-05 ENCOUNTER — Other Ambulatory Visit (HOSPITAL_COMMUNITY): Payer: Self-pay

## 2022-01-09 ENCOUNTER — Other Ambulatory Visit: Payer: Self-pay | Admitting: Family Medicine

## 2022-01-09 ENCOUNTER — Encounter: Payer: Self-pay | Admitting: Family Medicine

## 2022-01-14 ENCOUNTER — Other Ambulatory Visit (HOSPITAL_COMMUNITY): Payer: Self-pay

## 2022-01-15 ENCOUNTER — Other Ambulatory Visit (HOSPITAL_COMMUNITY): Payer: Self-pay

## 2022-01-22 ENCOUNTER — Emergency Department (HOSPITAL_COMMUNITY): Payer: Medicare HMO

## 2022-01-22 ENCOUNTER — Other Ambulatory Visit: Payer: Self-pay

## 2022-01-22 ENCOUNTER — Emergency Department (HOSPITAL_COMMUNITY)
Admission: EM | Admit: 2022-01-22 | Discharge: 2022-01-22 | Disposition: A | Discharge status: Home / Self Care | Payer: Medicare HMO | Source: Home / Self Care | Attending: Emergency Medicine | Admitting: Emergency Medicine

## 2022-01-22 ENCOUNTER — Encounter (HOSPITAL_COMMUNITY): Payer: Self-pay

## 2022-01-22 DIAGNOSIS — Z79899 Other long term (current) drug therapy: Secondary | ICD-10-CM | POA: Insufficient documentation

## 2022-01-22 DIAGNOSIS — I110 Hypertensive heart disease with heart failure: Secondary | ICD-10-CM | POA: Insufficient documentation

## 2022-01-22 DIAGNOSIS — R000 Tachycardia, unspecified: Secondary | ICD-10-CM | POA: Diagnosis not present

## 2022-01-22 DIAGNOSIS — I2510 Atherosclerotic heart disease of native coronary artery without angina pectoris: Secondary | ICD-10-CM | POA: Insufficient documentation

## 2022-01-22 DIAGNOSIS — E039 Hypothyroidism, unspecified: Secondary | ICD-10-CM | POA: Insufficient documentation

## 2022-01-22 DIAGNOSIS — I700 Atherosclerosis of aorta: Secondary | ICD-10-CM | POA: Diagnosis not present

## 2022-01-22 DIAGNOSIS — R197 Diarrhea, unspecified: Secondary | ICD-10-CM | POA: Insufficient documentation

## 2022-01-22 DIAGNOSIS — R1084 Generalized abdominal pain: Secondary | ICD-10-CM | POA: Insufficient documentation

## 2022-01-22 DIAGNOSIS — Z1152 Encounter for screening for COVID-19: Secondary | ICD-10-CM | POA: Insufficient documentation

## 2022-01-22 DIAGNOSIS — E119 Type 2 diabetes mellitus without complications: Secondary | ICD-10-CM | POA: Insufficient documentation

## 2022-01-22 DIAGNOSIS — I5020 Unspecified systolic (congestive) heart failure: Secondary | ICD-10-CM | POA: Insufficient documentation

## 2022-01-22 DIAGNOSIS — Z85818 Personal history of malignant neoplasm of other sites of lip, oral cavity, and pharynx: Secondary | ICD-10-CM | POA: Insufficient documentation

## 2022-01-22 DIAGNOSIS — R109 Unspecified abdominal pain: Secondary | ICD-10-CM | POA: Diagnosis not present

## 2022-01-22 DIAGNOSIS — R1013 Epigastric pain: Secondary | ICD-10-CM | POA: Insufficient documentation

## 2022-01-22 DIAGNOSIS — R1011 Right upper quadrant pain: Secondary | ICD-10-CM | POA: Insufficient documentation

## 2022-01-22 DIAGNOSIS — R112 Nausea with vomiting, unspecified: Secondary | ICD-10-CM | POA: Insufficient documentation

## 2022-01-22 DIAGNOSIS — E876 Hypokalemia: Secondary | ICD-10-CM | POA: Diagnosis not present

## 2022-01-22 DIAGNOSIS — Z7982 Long term (current) use of aspirin: Secondary | ICD-10-CM | POA: Insufficient documentation

## 2022-01-22 LAB — URINALYSIS, ROUTINE W REFLEX MICROSCOPIC
Bilirubin Urine: NEGATIVE
Glucose, UA: NEGATIVE mg/dL
Hgb urine dipstick: NEGATIVE
Ketones, ur: 5 mg/dL — AB
Leukocytes,Ua: NEGATIVE
Nitrite: NEGATIVE
Protein, ur: 100 mg/dL — AB
Specific Gravity, Urine: 1.027 (ref 1.005–1.030)
pH: 5 (ref 5.0–8.0)

## 2022-01-22 LAB — RESP PANEL BY RT-PCR (FLU A&B, COVID) ARPGX2
Influenza A by PCR: NEGATIVE
Influenza B by PCR: NEGATIVE
SARS Coronavirus 2 by RT PCR: NEGATIVE

## 2022-01-22 LAB — AMMONIA: Ammonia: 22 umol/L (ref 9–35)

## 2022-01-22 LAB — COMPREHENSIVE METABOLIC PANEL
ALT: 31 U/L (ref 0–44)
AST: 34 U/L (ref 15–41)
Albumin: 3.5 g/dL (ref 3.5–5.0)
Alkaline Phosphatase: 53 U/L (ref 38–126)
Anion gap: 16 — ABNORMAL HIGH (ref 5–15)
BUN: 12 mg/dL (ref 8–23)
CO2: 26 mmol/L (ref 22–32)
Calcium: 9.1 mg/dL (ref 8.9–10.3)
Chloride: 97 mmol/L — ABNORMAL LOW (ref 98–111)
Creatinine, Ser: 1.26 mg/dL — ABNORMAL HIGH (ref 0.61–1.24)
GFR, Estimated: 58 mL/min — ABNORMAL LOW (ref >60)
Glucose, Bld: 189 mg/dL — ABNORMAL HIGH (ref 70–99)
Potassium: 2.6 mmol/L — CL (ref 3.5–5.1)
Sodium: 139 mmol/L (ref 135–145)
Total Bilirubin: 2.7 mg/dL — ABNORMAL HIGH (ref 0.3–1.2)
Total Protein: 6.3 g/dL — ABNORMAL LOW (ref 6.5–8.1)

## 2022-01-22 LAB — PROTIME-INR
INR: 1.1 (ref 0.8–1.2)
Prothrombin Time: 14 seconds (ref 11.4–15.2)

## 2022-01-22 LAB — LIPASE, BLOOD: Lipase: 28 U/L (ref 11–51)

## 2022-01-22 LAB — CBC
HCT: 35.9 % — ABNORMAL LOW (ref 39.0–52.0)
Hemoglobin: 12.4 g/dL — ABNORMAL LOW (ref 13.0–17.0)
MCH: 30.5 pg (ref 26.0–34.0)
MCHC: 34.5 g/dL (ref 30.0–36.0)
MCV: 88.4 fL (ref 80.0–100.0)
Platelets: 272 10*3/uL (ref 150–400)
RBC: 4.06 MIL/uL — ABNORMAL LOW (ref 4.22–5.81)
RDW: 14.3 % (ref 11.5–15.5)
WBC: 10.9 10*3/uL — ABNORMAL HIGH (ref 4.0–10.5)
nRBC: 0 % (ref 0.0–0.2)

## 2022-01-22 MED ORDER — ONDANSETRON HCL 4 MG PO TABS: 4.0000 mg | ORAL_TABLET | Freq: Three times a day (TID) | ORAL | 0 refills | Status: DC | PRN

## 2022-01-22 MED ORDER — ONDANSETRON HCL 4 MG/2ML IJ SOLN
4.0000 mg | Freq: Once | INTRAMUSCULAR | Status: AC
  Administered 2022-01-22: 4 mg via INTRAVENOUS
  Filled 2022-01-22: qty 2

## 2022-01-22 MED ORDER — IOHEXOL 350 MG/ML SOLN
75.0000 mL | Freq: Once | INTRAVENOUS | Status: AC | PRN
  Administered 2022-01-22: 75 mL via INTRAVENOUS

## 2022-01-22 MED ORDER — POTASSIUM CHLORIDE CRYS ER 20 MEQ PO TBCR
40.0000 meq | EXTENDED_RELEASE_TABLET | Freq: Once | ORAL | Status: AC
  Administered 2022-01-22: 40 meq via ORAL
  Filled 2022-01-22: qty 2

## 2022-01-22 MED ORDER — SODIUM CHLORIDE 0.9 % IV BOLUS
500.0000 mL | Freq: Once | INTRAVENOUS | Status: AC
  Administered 2022-01-22: 500 mL via INTRAVENOUS

## 2022-01-22 MED ORDER — LOPERAMIDE HCL 2 MG PO CAPS: 2.0000 mg | ORAL_CAPSULE | Freq: Four times a day (QID) | ORAL | 0 refills | Status: DC | PRN

## 2022-01-22 MED ORDER — MORPHINE SULFATE (PF) 4 MG/ML IV SOLN
4.0000 mg | Freq: Once | INTRAVENOUS | Status: AC
  Administered 2022-01-22: 4 mg via INTRAVENOUS
  Filled 2022-01-22: qty 1

## 2022-01-22 NOTE — ED Notes (Signed)
Patient transported to CT 

## 2022-01-22 NOTE — ED Notes (Signed)
Critical potassium 2.6.  MD Scheving notified and aware.  No new orders at this time.

## 2022-01-22 NOTE — ED Notes (Signed)
Pt sclera has some yellowing

## 2022-01-22 NOTE — Discharge Instructions (Addendum)
We evaluated you for your nausea, vomiting, diarrhea.    Your lab test showed a low potassium level.  Please follow-up with your primary doctor to have this rechecked.  Eating foods such as potatoes and bananas can help increase your potassium level.  Your lab test also showed a mild kidney injury which is a sign of dehydration.  Please be sure to keep up with your fluid intake.  Your CT scan did not show sign of any dangerous problem or infection inside your abdomen. Your nausea, vomiting, and diarrhea are probably due to a viral infection.  This could be an infection such as COVID.  Please keep a close eye in your symptoms.  If you have any worsening symptoms, such as worsening pain, difficulty breathing, blood in your stool, inability to keep food down, feeling lethargic, please return to the emergency department.

## 2022-01-22 NOTE — ED Provider Notes (Signed)
[facility] DEPARTMENT Provider Note  CSN: [id number] Arrival date & time: 01/22/22 1142  Chief Complaint(s) Abdominal Pain  HPI Kevin Mcclain is a 76 y.o. male with history of diabetes, heart failure with reduced ejection fraction, hyperlipidemia, hypertension, rheumatoid arthritis, tonsillar cancer presenting to the emergency department with nausea, vomiting, diarrhea.  Patient reports about 1 week of nausea, vomiting, diarrhea.  He denies any hematemesis, blood in his stool, black stool.  He also reports some upper abdominal pain.  He denies any chest pain, shortness of breath.  He denies any cough.  Symptoms are mild.  No sick contacts, no travel, no recent antibiotic use.   Past Medical History Past Medical History:  Diagnosis Date   Allergy    Arthritis    Ascending aortic aneurysm ([place]) 03/25/2016   03/23/16 CTA chest Mildly aneurysmal ascending aorta at 4.0 cm.  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   Atherosclerosis of aorta ([place]) 07/10/2019   Senile purpura ([place]) 03/17/2017   B12 deficiency 01/15/2017   BPH associated with nocturia 10/30/2016   Hypothyroidism 09/11/2016   Depression, major, single episode, moderate ([place]) 04/02/2016   Ascending aortic aneurysm ([place]) 03/25/2016   Diabetic neuropathy ([place]) 12/26/2015   Herpes zoster 07/15/2015   GERD (gastroesophageal reflux disease) 06/10/2015   History of tonsillar cancer 2016- with history of radiation 10/31/2014   Arthropathy 03/10/2013   Low back pain 11/26/2011   Kidney stone 11/04/2011   Osteoarthritis 11/18/2010   CAD (coronary artery disease) 10/11/2008   Gout 03/28/2007   Type 2 diabetes mellitus with peripheral neuropathy ([place]) 01/24/2007   Hyperlipidemia LDL goal <70 01/24/2007   Essential hypertension 01/24/2007   Home Medication(s) Prior to Admission medications   Medication Sig Start Date End Date Taking? Authorizing Provider  loperamide (IMODIUM) 2 MG capsule Take 1 capsule (2 mg total) by mouth 4 (four) times daily as needed for diarrhea or loose stools. 01/22/22  Yes Cristie Hem, MD  ondansetron (ZOFRAN) 4 MG tablet Take 1 tablet (4 mg total) by mouth every 8 (eight) hours as needed for nausea or vomiting. 01/22/22  Yes Cristie Hem, MD  sertraline (ZOLOFT) 100 MG tablet Take 100 mg by mouth daily. 11/20/21  Yes [provider]  Ascorbic Acid (VITAMIN C PO) Take 1 tablet by mouth every evening.    [provider]  aspirin EC 81 MG tablet Take 81 mg by mouth in the morning.    [provider]  atorvastatin (LIPITOR) 20 MG tablet Take 1 tablet (20 mg total) by mouth daily. 07/01/21   Freada Bergeron, MD  blood glucose meter kit and supplies KIT Dispense based on patient and  insurance preference. Use up to four times daily as directed. 12/10/21   Marin Olp, MD  colchicine 0.6 MG tablet TAKE 2 TABLETS BY MOUTH AT 1ST SIGN OF GOUT FLARE, MAY REPEAT 1 TABLET IN 2HRS, THEN ONCE DAILY UNTIL SYMPTOMS RESOLVE 10/21/20   Marin Olp, MD  fentaNYL (DURAGESIC) 50 MCG/HR Place 1 patch onto the skin every 72 hours 09/15/21     fentaNYL (DURAGESIC) 50 MCG/HR Place 1 patch onto the skin every 3 (three) days. 12/16/21     fentaNYL (DURAGESIC) 50 MCG/HR Place 1 patch onto the skin every 72 hours 01/15/22     fentaNYL (DURAGESIC) 50 MCG/HR Place 1 patch onto the skin every 3 (three) days. 02/13/22     glucose blood (ONETOUCH VERIO) test strip USE 4 TIMES DAILY 01/09/22   Marin Olp, MD  levothyroxine (SYNTHROID) 100 MCG tablet Take 1 tablet (100 mcg total) by mouth daily before breakfast. 07/07/21   Marin Olp, MD  metoprolol succinate (TOPROL-XL) 25 MG 24 hr tablet TAKE 1 TABLET (25 MG TOTAL) BY MOUTH DAILY. TAKE WITH OR IMMEDIATELY FOLLOWING A MEAL. 04/09/21   Marin Olp, MD  Naphazoline-Glycerin (CLEAR EYES COOLING COMFORT) 0.03-0.5 % SOLN Place 1-2 drops into both eyes 3 (three) times daily as needed (dry/irritated eyes.).    [provider]  nitroGLYCERIN (NITROSTAT) 0.4 MG SL tablet Place 1 tablet (0.4 mg total) under the tongue every 5 (five) minutes as needed. 12/10/21 01/08/24  Marin Olp, MD  oxyCODONE (ROXICODONE) 5 MG/5ML solution Take 10-15 mLs (10-15 mg total) by mouth every 4 (four) hours as needed for breakthrough cancer pain. 09/15/21     oxyCODONE (ROXICODONE) 5 MG/5ML solution Take 10-15 mLs (10-15 mg total) by mouth every 4 (four) hours as needed for breakthrough cancer pain 12/16/21     oxyCODONE (ROXICODONE) 5 MG/5ML solution Take 10-15 mLs (10-15 mg total) by mouth every 4 (four) hours as needed for breakthrough cancer pain 01/15/22     oxyCODONE (ROXICODONE) 5 MG/5ML solution Take 10-15 mLs (10-15 mg total) by mouth every 4  (four) hours as needed for breakthrough cancer pain 02/13/22     ramipril (ALTACE) 5 MG capsule TAKE 2 CAPSULES BY MOUTH EVERY MORNING 08/14/21   Marin Olp, MD  spironolactone (ALDACTONE) 25 MG tablet Take 0.5 tablets (12.5 mg total) by mouth daily. 09/17/21   Freada Bergeron, MD  VITAMIN D PO Take 1 tablet by mouth every evening.    [provider]  Past Surgical History Past Surgical History:  Procedure Laterality Date   CHOLECYSTECTOMY  2012   COLONOSCOPY W/ POLYPECTOMY  2015   CORONARY ARTERY BYPASS GRAFT  2005   6 grafts   CYSTOSCOPY     kidney stone removal   IR GASTROSTOMY TUBE REMOVAL  06/25/2016   LITHOTRIPSY     MULTIPLE EXTRACTIONS WITH ALVEOLOPLASTY N/A 11/14/2014   Procedure: Extraction of tooth #2 and 31 with alveoloplasty after sectioning of bridge at distal #29 with full mouth debridement of remaining dention;  Surgeon: Lenn Cal, DDS;  Location: [place] [facility];  Service: Oral Surgery;  Laterality: N/A;   NASAL SINUS SURGERY Right 11/14/2014   Procedure: RIGHT ENDOSCOPIC ANTROSTOMY AND ANTERIOR ETHMOIDECTOMY ;  Surgeon: Jodi Marble, MD;  Location: [facility];  Service: ENT;  Laterality: Right;   RIGHT/LEFT HEART CATH AND CORONARY/GRAFT ANGIOGRAPHY N/A 09/01/2021   Procedure: RIGHT/LEFT HEART CATH AND CORONARY/GRAFT ANGIOGRAPHY;  Surgeon: Sherren Mocha, MD;  Location: [facility];  Service: Cardiovascular;  Laterality: N/A;   Family History Family History  Problem Relation Age of Onset   Heart attack Mother        deceased age 31 secondary   Colon cancer Father    Stroke Father        deceased age 19   Heart disease Father        heart attack   Stroke Sister 60   Coronary artery disease Brother 75       s/p bypass surgery /stent   CAD Sister        potentially   Lymphoma Sister     Social History Social History    Tobacco Use   Smoking status: Never   Smokeless tobacco: Never  Vaping Use   Vaping Use: Never used  Substance Use Topics   Alcohol use: No   Drug use: No   Allergies Ace inhibitors, Codeine, Sitagliptin phosphate, and Tessalon [benzonatate]  Review of Systems Review of Systems  Physical Exam Vital Signs  I have reviewed the triage vital signs BP 138/75 (BP Location: Right Arm)   Pulse (!) 101   Temp 97.6 F (36.4 C) (Oral)   Resp 20   SpO2 96%  Physical Exam Vitals and nursing note reviewed.  Constitutional:      General: He is not in acute distress.    Appearance: Normal appearance.  HENT:     Mouth/Throat:     Mouth: Mucous membranes are moist.  Eyes:     Conjunctiva/sclera: Conjunctivae normal.  Cardiovascular:     Rate and Rhythm: Normal rate and regular rhythm.  Pulmonary:     Effort: Pulmonary effort is normal. No respiratory distress.     Breath sounds: Normal breath sounds.  Abdominal:     General: Abdomen is flat.     Palpations: Abdomen is soft.     Tenderness: There is abdominal tenderness (mild) in the right upper quadrant and epigastric area.  Musculoskeletal:     Right lower leg: No edema.     Left lower leg: No edema.  Skin:    General: Skin is warm and dry.     Capillary Refill: Capillary refill takes less than 2 seconds.  Neurological:     Mental Status: He is alert and oriented to person, place, and time. Mental status is at baseline.  Psychiatric:        Mood and Affect: Mood normal.        Behavior: Behavior normal.  ED Results and Treatments Labs (all labs ordered are listed, but only abnormal results are displayed) Labs Reviewed  COMPREHENSIVE METABOLIC PANEL - Abnormal; Notable for the following components:      Result Value   Potassium 2.6 (*)    Chloride 97 (*)    Glucose, Bld 189 (*)    Creatinine, Ser 1.26 (*)    Total Protein 6.3 (*)    Total Bilirubin 2.7 (*)    GFR, Estimated 58 (*)    Anion gap 16 (*)    All  other components within normal limits  CBC - Abnormal; Notable for the following components:   WBC 10.9 (*)    RBC 4.06 (*)    Hemoglobin 12.4 (*)    HCT 35.9 (*)    All other components within normal limits  URINALYSIS, ROUTINE W REFLEX MICROSCOPIC - Abnormal; Notable for the following components:   Color, Urine AMBER (*)    APPearance HAZY (*)    Ketones, ur 5 (*)    Protein, ur 100 (*)    Bacteria, UA RARE (*)    All other components within normal limits  RESP PANEL BY RT-PCR (FLU A&B, COVID) ARPGX2  LIPASE, BLOOD  PROTIME-INR  AMMONIA                                                                                                                          Radiology CT Abdomen Pelvis W Contrast  Result Date: 01/22/2022 CLINICAL DATA:  Acute abdominal pain. EXAM: CT ABDOMEN AND PELVIS WITH CONTRAST TECHNIQUE: Multidetector CT imaging of the abdomen and pelvis was performed using the standard protocol following bolus administration of intravenous contrast. RADIATION DOSE REDUCTION: This exam was performed according to the departmental dose-optimization program which includes automated exposure control, adjustment of the mA and/or kV according to patient size and/or use of iterative reconstruction technique. CONTRAST:  43m OMNIPAQUE IOHEXOL 350 MG/ML SOLN COMPARISON:  CT scan 03/23/2016 FINDINGS: Lower chest: Moderate-sized bilateral pleural effusions with overlying atelectasis. The heart is within normal limits in size. No pericardial effusion. Aortic and coronary artery calcifications are noted. Hepatobiliary: Stable pneumobilia related to prior cholecystectomy and sphincterotomy. No hepatic lesions or intrahepatic biliary dilatation. No common bile duct dilatation. Pancreas: No mass, inflammation or ductal dilatation. Spleen: Stable 4 cm splenic lesions, likely a benign hemangioma. No change since prior studies. Adrenals/Urinary Tract: The adrenal glands are normal. Stable bilateral simple  renal cysts. No worrisome renal lesions. No follow-up imaging is necessary. The bladder is unremarkable. Stomach/Bowel: The stomach, duodenum, small bowel and colon are unremarkable. No acute inflammatory process, mass lesions or obstructive findings. The terminal ileum and appendix are normal. Scattered descending and sigmoid colon diverticulosis Vascular/Lymphatic: Stable atherosclerotic calcifications involving the aorta and branch vessels but no aneurysm or dissection. The major venous structures are patent. No mesenteric or retroperitoneal mass or adenopathy. Reproductive: The prostate gland and seminal vesicles are unremarkable. Other: No pelvic mass or adenopathy. No free pelvic fluid collections.  No inguinal mass or adenopathy. No abdominal wall hernia or subcutaneous lesions. Musculoskeletal: No significant bony findings. IMPRESSION: 1. No acute abdominal/pelvic findings, mass lesions or adenopathy. 2. Moderate-sized bilateral pleural effusions with overlying atelectasis. 3. Stable 4 cm splenic lesion, likely benign hemangioma. 4. Stable pneumobilia related to prior cholecystectomy and sphincterotomy. 5. Aortic atherosclerosis. Aortic Atherosclerosis (ICD10-I70.0). Electronically Signed   By: Marijo Sanes M.D.   On: 01/22/2022 14:16    Pertinent labs & imaging results that were available during my care of the patient were reviewed by me and considered in my medical decision making (see MDM for details).  Medications Ordered in ED Medications  sodium chloride 0.9 % bolus 500 mL (0 mLs Intravenous Stopped 01/22/22 1543)  ondansetron (ZOFRAN) injection 4 mg (4 mg Intravenous Given 01/22/22 1247)  morphine (PF) 4 MG/ML injection 4 mg (4 mg Intravenous Given 01/22/22 1248)  potassium chloride SA (KLOR-CON M) CR tablet 40 mEq (40 mEq Oral Given 01/22/22 1303)  iohexol (OMNIPAQUE) 350 MG/ML injection 75 mL (75 mLs Intravenous Contrast Given 01/22/22 1343)                                                                                                                                      Procedures .1-3 Lead EKG Interpretation  Performed by: Cristie Hem, MD Authorized by: Cristie Hem, MD     Interpretation: normal     ECG rate:  85   ECG rate assessment: normal     Rhythm: sinus tachycardia     Ectopy: PAC     Conduction: normal     (including critical care time)  Medical Decision Making / ED Course   MDM:  76 year old male presenting to the emergency department with nausea, vomiting, diarrhea.  Patient overall well-appearing, physical exam with mild upper abdominal tenderness.  Given abdominal tenderness, CT scan obtained to evaluate for potential differential of biliary dilatation, pancreatitis, small bowel obstruction, perforation, volvulus, colitis, diverticulitis.  CT scan ultimately not demonstrative of acute intra-abdominal pathology.  Given symptoms, differential likely includes gastroenteritis, viral infection.  Patient overall well-appearing.  Labs notable for hypokalemia, mild leukocytosis, mild AKI.  Gave patient fluid, antinausea medication, and patient feels much better.  Repleted potassium.  Patient had borderline low oxygen saturation on monitor, patient able to ambulate without evidence of desaturation, and denies any respiratory symptoms.  However, will send COVID swab in addition.  Discussed findings with the patient and his wife.  Will prescribe Imodium and Zofran.  Given age and comorbidities, advise strict return precautions should he have any worsening symptoms, blood in stool, worsening abdominal pain, or any other concerning symptoms.  Advised close follow-up with primary physician for repeat metabolic panel for creatinine re-check.   Will discharge patient to home. All questions answered. Patient comfortable with plan of discharge. Return precautions discussed with patient and specified on the after visit summary.  Additional history  obtained: -Additional history obtained from spouse -External records from outside source obtained and reviewed including: Chart review including previous notes, labs, imaging, consultation notes including admission 09/01/21   Lab Tests: -I ordered, reviewed, and interpreted labs.   The pertinent results include:   Labs Reviewed  COMPREHENSIVE METABOLIC PANEL - Abnormal; Notable for the following components:      Result Value   Potassium 2.6 (*)    Chloride 97 (*)    Glucose, Bld 189 (*)    Creatinine, Ser 1.26 (*)    Total Protein 6.3 (*)    Total Bilirubin 2.7 (*)    GFR, Estimated 58 (*)    Anion gap 16 (*)    All other components within normal limits  CBC - Abnormal; Notable for the following components:   WBC 10.9 (*)    RBC 4.06 (*)    Hemoglobin 12.4 (*)    HCT 35.9 (*)    All other components within normal limits  URINALYSIS, ROUTINE W REFLEX MICROSCOPIC - Abnormal; Notable for the following components:   Color, Urine AMBER (*)    APPearance HAZY (*)    Ketones, ur 5 (*)    Protein, ur 100 (*)    Bacteria, UA RARE (*)    All other components within normal limits  RESP PANEL BY RT-PCR (FLU A&B, COVID) ARPGX2  LIPASE, BLOOD  PROTIME-INR  AMMONIA    Notable for mild AKI, hypokalemia, mild leukocytosis  Imaging Studies ordered: I ordered imaging studies including CT A/P On my interpretation imaging demonstrates no acute process I independently visualized and interpreted imaging. I agree with the radiologist interpretation   Medicines ordered and prescription drug management: Meds ordered this encounter  Medications   sodium chloride 0.9 % bolus 500 mL   ondansetron (ZOFRAN) injection 4 mg   morphine (PF) 4 MG/ML injection 4 mg   potassium chloride SA (KLOR-CON M) CR tablet 40 mEq   iohexol (OMNIPAQUE) 350 MG/ML injection 75 mL   ondansetron (ZOFRAN) 4 MG tablet    Sig: Take 1 tablet (4 mg total) by mouth every 8 (eight) hours as needed for nausea or vomiting.     Dispense:  12 tablet    Refill:  0   loperamide (IMODIUM) 2 MG capsule    Sig: Take 1 capsule (2 mg total) by mouth 4 (four) times daily as needed for diarrhea or loose stools.    Dispense:  12 capsule    Refill:  0    -I have reviewed the patients home medicines and have made adjustments as needed  Cardiac Monitoring: The patient was maintained on a cardiac monitor.  I personally viewed and interpreted the cardiac monitored which showed an underlying rhythm of: NSR with PAC  Reevaluation: After the interventions noted above, I reevaluated the patient and found that they have improved  Co morbidities that complicate the patient evaluation  Past Medical History:  Diagnosis Date   Allergy    Arthritis    Ascending aortic aneurysm ([place]) 03/25/2016   03/23/16 CTA chest Mildly aneurysmal ascending aorta at 4.0 cm.  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    Hyperlipidemia    Hyperplastic colon polyp    Hypertension    Kidney stones    Moderate to severe mitral regurgitation 08/19/2021   Echocardiogram 06/2021: EF 45-50, mod to severe MR   Rheumatoid arthritis ([place])    Shingles outbreak 04/23/15   left shoulder and left arm   Vertigo       Dispostion: Disposition decision including need for hospitalization was considered, and patient discharged from  emergency department.    Final Clinical Impression(s) / ED Diagnoses Final diagnoses:  Generalized abdominal pain  Nausea vomiting and diarrhea     This chart was dictated using voice recognition software.  Despite best efforts to proofread,  errors can occur which can change the documentation meaning.    Cristie Hem, MD 01/22/22 [phone]

## 2022-01-22 NOTE — ED Triage Notes (Signed)
Pt arrived POV from home c/o abdominal pain N/V/D x1 week. Pt states it is not constant that it comes and goes but got bad again this morning.

## 2022-01-22 NOTE — ED Notes (Signed)
Oxygen 95% while ambulating

## 2022-01-23 ENCOUNTER — Other Ambulatory Visit: Payer: Self-pay

## 2022-01-23 ENCOUNTER — Inpatient Hospital Stay (HOSPITAL_COMMUNITY)
Admission: EM | Admit: 2022-01-23 | Discharge: 2022-01-27 | DRG: 291 | Disposition: A | Discharge status: Hospice | Payer: Medicare HMO | Attending: Internal Medicine | Admitting: Internal Medicine

## 2022-01-23 ENCOUNTER — Emergency Department (HOSPITAL_COMMUNITY): Payer: Medicare HMO

## 2022-01-23 DIAGNOSIS — Z79899 Other long term (current) drug therapy: Secondary | ICD-10-CM

## 2022-01-23 DIAGNOSIS — K7200 Acute and subacute hepatic failure without coma: Secondary | ICD-10-CM | POA: Diagnosis present

## 2022-01-23 DIAGNOSIS — I252 Old myocardial infarction: Secondary | ICD-10-CM

## 2022-01-23 DIAGNOSIS — E876 Hypokalemia: Secondary | ICD-10-CM | POA: Diagnosis not present

## 2022-01-23 DIAGNOSIS — I5020 Unspecified systolic (congestive) heart failure: Secondary | ICD-10-CM | POA: Diagnosis not present

## 2022-01-23 DIAGNOSIS — N400 Enlarged prostate without lower urinary tract symptoms: Secondary | ICD-10-CM | POA: Diagnosis present

## 2022-01-23 DIAGNOSIS — I5023 Acute on chronic systolic (congestive) heart failure: Secondary | ICD-10-CM | POA: Diagnosis present

## 2022-01-23 DIAGNOSIS — F1120 Opioid dependence, uncomplicated: Secondary | ICD-10-CM | POA: Diagnosis present

## 2022-01-23 DIAGNOSIS — R638 Other symptoms and signs concerning food and fluid intake: Secondary | ICD-10-CM | POA: Diagnosis not present

## 2022-01-23 DIAGNOSIS — E872 Acidosis, unspecified: Secondary | ICD-10-CM | POA: Diagnosis present

## 2022-01-23 DIAGNOSIS — Z885 Allergy status to narcotic agent status: Secondary | ICD-10-CM

## 2022-01-23 DIAGNOSIS — E785 Hyperlipidemia, unspecified: Secondary | ICD-10-CM | POA: Diagnosis present

## 2022-01-23 DIAGNOSIS — I509 Heart failure, unspecified: Secondary | ICD-10-CM

## 2022-01-23 DIAGNOSIS — Z7189 Other specified counseling: Secondary | ICD-10-CM | POA: Diagnosis not present

## 2022-01-23 DIAGNOSIS — Z66 Do not resuscitate: Secondary | ICD-10-CM | POA: Diagnosis present

## 2022-01-23 DIAGNOSIS — I110 Hypertensive heart disease with heart failure: Secondary | ICD-10-CM | POA: Diagnosis not present

## 2022-01-23 DIAGNOSIS — N179 Acute kidney failure, unspecified: Secondary | ICD-10-CM | POA: Diagnosis present

## 2022-01-23 DIAGNOSIS — R64 Cachexia: Secondary | ICD-10-CM | POA: Diagnosis present

## 2022-01-23 DIAGNOSIS — E119 Type 2 diabetes mellitus without complications: Secondary | ICD-10-CM | POA: Diagnosis not present

## 2022-01-23 DIAGNOSIS — I5084 End stage heart failure: Secondary | ICD-10-CM | POA: Diagnosis present

## 2022-01-23 DIAGNOSIS — R7989 Other specified abnormal findings of blood chemistry: Secondary | ICD-10-CM | POA: Insufficient documentation

## 2022-01-23 DIAGNOSIS — I272 Pulmonary hypertension, unspecified: Secondary | ICD-10-CM | POA: Diagnosis present

## 2022-01-23 DIAGNOSIS — E039 Hypothyroidism, unspecified: Secondary | ICD-10-CM | POA: Diagnosis present

## 2022-01-23 DIAGNOSIS — Z6820 Body mass index (BMI) 20.0-20.9, adult: Secondary | ICD-10-CM

## 2022-01-23 DIAGNOSIS — M069 Rheumatoid arthritis, unspecified: Secondary | ICD-10-CM | POA: Diagnosis present

## 2022-01-23 DIAGNOSIS — Z8249 Family history of ischemic heart disease and other diseases of the circulatory system: Secondary | ICD-10-CM

## 2022-01-23 DIAGNOSIS — I255 Ischemic cardiomyopathy: Secondary | ICD-10-CM | POA: Diagnosis present

## 2022-01-23 DIAGNOSIS — Z515 Encounter for palliative care: Secondary | ICD-10-CM

## 2022-01-23 DIAGNOSIS — R627 Adult failure to thrive: Secondary | ICD-10-CM | POA: Diagnosis present

## 2022-01-23 DIAGNOSIS — I2510 Atherosclerotic heart disease of native coronary artery without angina pectoris: Secondary | ICD-10-CM | POA: Diagnosis present

## 2022-01-23 DIAGNOSIS — Z789 Other specified health status: Secondary | ICD-10-CM | POA: Diagnosis not present

## 2022-01-23 DIAGNOSIS — Z923 Personal history of irradiation: Secondary | ICD-10-CM

## 2022-01-23 DIAGNOSIS — I5031 Acute diastolic (congestive) heart failure: Secondary | ICD-10-CM | POA: Diagnosis not present

## 2022-01-23 DIAGNOSIS — R079 Chest pain, unspecified: Secondary | ICD-10-CM | POA: Diagnosis not present

## 2022-01-23 DIAGNOSIS — E43 Unspecified severe protein-calorie malnutrition: Secondary | ICD-10-CM | POA: Diagnosis not present

## 2022-01-23 DIAGNOSIS — G8929 Other chronic pain: Secondary | ICD-10-CM | POA: Diagnosis present

## 2022-01-23 DIAGNOSIS — Z1152 Encounter for screening for COVID-19: Secondary | ICD-10-CM

## 2022-01-23 DIAGNOSIS — Z79890 Hormone replacement therapy (postmenopausal): Secondary | ICD-10-CM

## 2022-01-23 DIAGNOSIS — I340 Nonrheumatic mitral (valve) insufficiency: Secondary | ICD-10-CM | POA: Diagnosis not present

## 2022-01-23 DIAGNOSIS — K219 Gastro-esophageal reflux disease without esophagitis: Secondary | ICD-10-CM | POA: Diagnosis present

## 2022-01-23 DIAGNOSIS — Z951 Presence of aortocoronary bypass graft: Secondary | ICD-10-CM

## 2022-01-23 DIAGNOSIS — R570 Cardiogenic shock: Secondary | ICD-10-CM | POA: Diagnosis present

## 2022-01-23 DIAGNOSIS — Z85819 Personal history of malignant neoplasm of unspecified site of lip, oral cavity, and pharynx: Secondary | ICD-10-CM | POA: Diagnosis present

## 2022-01-23 DIAGNOSIS — R0602 Shortness of breath: Principal | ICD-10-CM

## 2022-01-23 DIAGNOSIS — Z85818 Personal history of malignant neoplasm of other sites of lip, oral cavity, and pharynx: Secondary | ICD-10-CM | POA: Diagnosis not present

## 2022-01-23 DIAGNOSIS — Z888 Allergy status to other drugs, medicaments and biological substances status: Secondary | ICD-10-CM

## 2022-01-23 DIAGNOSIS — Z7982 Long term (current) use of aspirin: Secondary | ICD-10-CM

## 2022-01-23 DIAGNOSIS — R109 Unspecified abdominal pain: Secondary | ICD-10-CM | POA: Diagnosis not present

## 2022-01-23 LAB — COMPREHENSIVE METABOLIC PANEL WITH GFR
ALT: 33 U/L (ref 0–44)
AST: 43 U/L — ABNORMAL HIGH (ref 15–41)
Albumin: 3.6 g/dL (ref 3.5–5.0)
Alkaline Phosphatase: 52 U/L (ref 38–126)
Anion gap: 17 — ABNORMAL HIGH (ref 5–15)
BUN: 16 mg/dL (ref 8–23)
CO2: 23 mmol/L (ref 22–32)
Calcium: 9 mg/dL (ref 8.9–10.3)
Chloride: 99 mmol/L (ref 98–111)
Creatinine, Ser: 1.61 mg/dL — ABNORMAL HIGH (ref 0.61–1.24)
GFR, Estimated: 44 mL/min — ABNORMAL LOW
Glucose, Bld: 214 mg/dL — ABNORMAL HIGH (ref 70–99)
Potassium: 2.7 mmol/L — CL (ref 3.5–5.1)
Sodium: 139 mmol/L (ref 135–145)
Total Bilirubin: 2.1 mg/dL — ABNORMAL HIGH (ref 0.3–1.2)
Total Protein: 6.3 g/dL — ABNORMAL LOW (ref 6.5–8.1)

## 2022-01-23 LAB — CBC
HCT: 36.5 % — ABNORMAL LOW (ref 39.0–52.0)
Hemoglobin: 11.8 g/dL — ABNORMAL LOW (ref 13.0–17.0)
MCH: 30.3 pg (ref 26.0–34.0)
MCHC: 32.3 g/dL (ref 30.0–36.0)
MCV: 93.6 fL (ref 80.0–100.0)
Platelets: 236 10*3/uL (ref 150–400)
RBC: 3.9 MIL/uL — ABNORMAL LOW (ref 4.22–5.81)
RDW: 14.7 % (ref 11.5–15.5)
WBC: 9.1 10*3/uL (ref 4.0–10.5)
nRBC: 0 % (ref 0.0–0.2)

## 2022-01-23 LAB — MAGNESIUM: Magnesium: 2.1 mg/dL (ref 1.7–2.4)

## 2022-01-23 LAB — LACTIC ACID, PLASMA
Lactic Acid, Venous: 4.6 mmol/L (ref 0.5–1.9)
Lactic Acid, Venous: 3.5 mmol/L (ref 0.5–1.9)

## 2022-01-23 LAB — TROPONIN I (HIGH SENSITIVITY)
Troponin I (High Sensitivity): 978 ng/L
Troponin I (High Sensitivity): 1119 ng/L (ref <18)
Troponin I (High Sensitivity): 957 ng/L (ref <18)

## 2022-01-23 LAB — BRAIN NATRIURETIC PEPTIDE: B Natriuretic Peptide: 2942 pg/mL — ABNORMAL HIGH (ref 0.0–100.0)

## 2022-01-23 MED ORDER — LEVOTHYROXINE SODIUM 100 MCG PO TABS
100.0000 ug | ORAL_TABLET | Freq: Every day | ORAL | Status: DC
  Administered 2022-01-24 – 2022-01-25 (×2): 100 ug via ORAL
  Filled 2022-01-23 (×2): qty 1

## 2022-01-23 MED ORDER — ACETAMINOPHEN 325 MG PO TABS: 650.0000 mg | ORAL_TABLET | Freq: Four times a day (QID) | ORAL | Status: DC | PRN

## 2022-01-23 MED ORDER — OXYCODONE HCL 5 MG PO TABS: 10.0000 mg | ORAL_TABLET | ORAL | Status: DC | PRN

## 2022-01-23 MED ORDER — LACTATED RINGERS IV BOLUS
250.0000 mL | Freq: Once | INTRAVENOUS | Status: AC
  Administered 2022-01-23: 250 mL via INTRAVENOUS

## 2022-01-23 MED ORDER — ONDANSETRON HCL 4 MG/2ML IJ SOLN
4.0000 mg | Freq: Four times a day (QID) | INTRAMUSCULAR | Status: DC | PRN
  Administered 2022-01-24 (×2): 4 mg via INTRAVENOUS
  Filled 2022-01-23 (×2): qty 2

## 2022-01-23 MED ORDER — FENTANYL 50 MCG/HR TD PT72
1.0000 | MEDICATED_PATCH | TRANSDERMAL | Status: DC
  Administered 2022-01-26: 1 via TRANSDERMAL
  Filled 2022-01-23: qty 1

## 2022-01-23 MED ORDER — HEPARIN (PORCINE) 25000 UT/250ML-% IV SOLN
750.0000 [IU]/h | INTRAVENOUS | Status: DC
  Administered 2022-01-23: 650 [IU]/h via INTRAVENOUS
  Filled 2022-01-23: qty 250

## 2022-01-23 MED ORDER — NITROGLYCERIN 0.4 MG SL SUBL: 0.4000 mg | SUBLINGUAL_TABLET | SUBLINGUAL | Status: DC | PRN

## 2022-01-23 MED ORDER — POTASSIUM CHLORIDE CRYS ER 20 MEQ PO TBCR
40.0000 meq | EXTENDED_RELEASE_TABLET | ORAL | Status: DC
  Administered 2022-01-23 (×2): 40 meq via ORAL
  Filled 2022-01-23 (×2): qty 2

## 2022-01-23 MED ORDER — OXYCODONE HCL 5 MG PO TABS
10.0000 mg | ORAL_TABLET | ORAL | Status: DC | PRN
  Administered 2022-01-23 – 2022-01-24 (×3): 15 mg via ORAL
  Administered 2022-01-24 – 2022-01-27 (×2): 10 mg via ORAL
  Filled 2022-01-23 (×2): qty 3
  Filled 2022-01-23 (×2): qty 2
  Filled 2022-01-23: qty 3

## 2022-01-23 MED ORDER — LEVOTHYROXINE SODIUM 100 MCG PO TABS: 100.0000 ug | ORAL_TABLET | Freq: Every day | ORAL | Status: DC

## 2022-01-23 MED ORDER — ACETAMINOPHEN 650 MG RE SUPP: 650.0000 mg | Freq: Four times a day (QID) | RECTAL | Status: DC | PRN

## 2022-01-23 MED ORDER — SPIRONOLACTONE 12.5 MG HALF TABLET
12.5000 mg | ORAL_TABLET | Freq: Every day | ORAL | Status: DC
  Administered 2022-01-23: 12.5 mg via ORAL
  Filled 2022-01-23 (×2): qty 1

## 2022-01-23 MED ORDER — ASPIRIN 81 MG PO TBEC: 81.0000 mg | DELAYED_RELEASE_TABLET | ORAL | Status: DC

## 2022-01-23 MED ORDER — METOPROLOL SUCCINATE ER 25 MG PO TB24
25.0000 mg | ORAL_TABLET | Freq: Every day | ORAL | Status: DC
  Administered 2022-01-23: 25 mg via ORAL
  Filled 2022-01-23 (×2): qty 1

## 2022-01-23 MED ORDER — SERTRALINE HCL 100 MG PO TABS
100.0000 mg | ORAL_TABLET | Freq: Every day | ORAL | Status: DC
  Administered 2022-01-23 – 2022-01-24 (×2): 100 mg via ORAL
  Filled 2022-01-23 (×5): qty 1

## 2022-01-23 MED ORDER — ATORVASTATIN CALCIUM 10 MG PO TABS
20.0000 mg | ORAL_TABLET | Freq: Every day | ORAL | Status: DC
  Administered 2022-01-23: 20 mg via ORAL
  Filled 2022-01-23 (×2): qty 2

## 2022-01-23 MED ORDER — POTASSIUM CHLORIDE 20 MEQ PO PACK
40.0000 meq | PACK | Freq: Once | ORAL | Status: AC
  Administered 2022-01-24: 40 meq via ORAL
  Filled 2022-01-23: qty 2

## 2022-01-23 MED ORDER — PANTOPRAZOLE SODIUM 40 MG PO TBEC
40.0000 mg | DELAYED_RELEASE_TABLET | Freq: Every day | ORAL | Status: DC
  Administered 2022-01-24: 40 mg via ORAL
  Filled 2022-01-23: qty 1

## 2022-01-23 MED ORDER — POTASSIUM CHLORIDE CRYS ER 20 MEQ PO TBCR: 40.0000 meq | EXTENDED_RELEASE_TABLET | Freq: Two times a day (BID) | ORAL | Status: DC

## 2022-01-23 MED ORDER — FUROSEMIDE 10 MG/ML IJ SOLN
40.0000 mg | Freq: Two times a day (BID) | INTRAMUSCULAR | Status: DC
  Administered 2022-01-23 – 2022-01-24 (×2): 40 mg via INTRAVENOUS
  Filled 2022-01-23 (×2): qty 4

## 2022-01-23 MED ORDER — POTASSIUM CHLORIDE 10 MEQ/100ML IV SOLN
10.0000 meq | INTRAVENOUS | Status: DC
  Administered 2022-01-23: 10 meq via INTRAVENOUS
  Filled 2022-01-23 (×2): qty 100

## 2022-01-23 MED ORDER — ONDANSETRON HCL 4 MG PO TABS: 4.0000 mg | ORAL_TABLET | Freq: Four times a day (QID) | ORAL | Status: DC | PRN

## 2022-01-23 MED ORDER — HEPARIN BOLUS VIA INFUSION
3300.0000 [IU] | Freq: Once | INTRAVENOUS | Status: AC
  Administered 2022-01-23: 3300 [IU] via INTRAVENOUS
  Filled 2022-01-23: qty 3300

## 2022-01-23 NOTE — Consult Note (Signed)
Cardiology Consultation   Patient ID: Siaosi Alter MRN: [id number]; DOB: [birth year]  Admit date: 01/23/2022 Date of Consult: 01/23/2022  PCP:  Marin Olp, MD   [facility] Cardiologist:  Freada Bergeron, MD        Patient Profile:   Joan Avetisyan is a 76 y.o. male with a hx of CABG 2005, LIMA-LAD & SVG-OM only patent graphs, HTN, HLD, HFrEF, DM, GERD, gout, mod-severe MR,  who is being seen 01/23/2022 for the evaluation of elevated troponin at the request of Dr Mayra Neer.  History of Present Illness:   Mr. Lupton has been having problems with dyspnea on exertion, and developed nausea vomiting and diarrhea.  He was seen in the ER on 11/23.  His potassium at that time was 2.6.  He was treated with 40 mEq of KDur and released, he was advised to eat bananas.  Mr. Domingo Mend reports a several week history of increasing dyspnea on exertion.  He had 1 episode of burning chest pain that was associated with shortness of breath and exertion.  The chest pain resolved spontaneously, without any medications taken.  He denies lower extremity edema, but describes orthopnea and PND.  Of note, his nurse was in the room and reported a good waveform on his oxygen, but then the oxygen saturations dropped into the 70s, while maintaining the waveform.  This raises concern for sleep apnea.  The nausea vomiting and diarrhea symptoms have improved.  TAVR, they came back to the hospital today because of his shortness of breath.  He says he just cannot do anything without having to stop and catch his breath.   Past Medical History:  Diagnosis Date   Allergy    Arthritis    Ascending aortic aneurysm ([place]) 03/25/2016   03/23/16 CTA chest Mildly aneurysmal ascending aorta at 4.0 cm.  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    Hyperlipidemia    Hyperplastic colon polyp    Hypertension    Kidney stones    Moderate to severe mitral regurgitation 08/19/2021   Echocardiogram 06/2021: EF 45-50, mod to severe MR   Rheumatoid arthritis ([place])    Shingles outbreak 04/23/15   left shoulder and left arm   Vertigo     Past Surgical History:  Procedure Laterality Date   CHOLECYSTECTOMY  2012   COLONOSCOPY W/ POLYPECTOMY  2015   CORONARY ARTERY BYPASS GRAFT  2005   6 grafts   CYSTOSCOPY     kidney stone removal   IR GASTROSTOMY TUBE REMOVAL  06/25/2016   LITHOTRIPSY     MULTIPLE EXTRACTIONS WITH ALVEOLOPLASTY N/A 11/14/2014   Procedure: Extraction of tooth #2 and 31 with alveoloplasty after sectioning of bridge at distal #29 with full mouth debridement of remaining dention;  Surgeon: Lenn Cal,  DDS;  Location: [place];  Service: Oral Surgery;  Laterality: N/A;   NASAL SINUS SURGERY Right 11/14/2014   Procedure: RIGHT ENDOSCOPIC ANTROSTOMY AND ANTERIOR ETHMOIDECTOMY ;  Surgeon: Jodi Marble, MD;  Location: [place];  Service: ENT;  Laterality: Right;   RIGHT/LEFT HEART CATH AND CORONARY/GRAFT ANGIOGRAPHY N/A 09/01/2021   Procedure: RIGHT/LEFT HEART CATH AND CORONARY/GRAFT ANGIOGRAPHY;  Surgeon: Sherren Mocha, MD;  Location: [place] [facility];  Service: Cardiovascular;  Laterality: N/A;     Home Medications:  Prior to Admission medications   Medication Sig Start Date End Date Taking? Authorizing Provider  Ascorbic Acid  (VITAMIN C PO) Take 1 tablet by mouth every evening.    [provider]  aspirin EC 81 MG tablet Take 81 mg by mouth in the morning.    [provider]  atorvastatin (LIPITOR) 20 MG tablet Take 1 tablet (20 mg total) by mouth daily. 07/01/21   Freada Bergeron, MD  blood glucose meter kit and supplies KIT Dispense based on patient and insurance preference. Use up to four times daily as directed. 12/10/21   Marin Olp, MD  colchicine 0.6 MG tablet TAKE 2 TABLETS BY MOUTH AT 1ST SIGN OF GOUT FLARE, MAY REPEAT 1 TABLET IN 2HRS, THEN ONCE DAILY UNTIL SYMPTOMS RESOLVE 10/21/20   Marin Olp, MD  fentaNYL (DURAGESIC) 50 MCG/HR Place 1 patch onto the skin every 72 hours 09/15/21     fentaNYL (DURAGESIC) 50 MCG/HR Place 1 patch onto the skin every 3 (three) days. 12/16/21     fentaNYL (DURAGESIC) 50 MCG/HR Place 1 patch onto the skin every 72 hours 01/15/22     fentaNYL (DURAGESIC) 50 MCG/HR Place 1 patch onto the skin every 3 (three) days. 02/13/22     glucose blood (ONETOUCH VERIO) test strip USE 4 TIMES DAILY 01/09/22   Marin Olp, MD  levothyroxine (SYNTHROID) 100 MCG tablet Take 1 tablet (100 mcg total) by mouth daily before breakfast. 07/07/21   Marin Olp, MD  loperamide (IMODIUM) 2 MG capsule Take 1 capsule (2 mg total) by mouth 4 (four) times daily as needed for diarrhea or loose stools. 01/22/22   Cristie Hem, MD  metoprolol succinate (TOPROL-XL) 25 MG 24 hr tablet TAKE 1 TABLET (25 MG TOTAL) BY MOUTH DAILY. TAKE WITH OR IMMEDIATELY FOLLOWING A MEAL. 04/09/21   Marin Olp, MD  Naphazoline-Glycerin (CLEAR EYES COOLING COMFORT) 0.03-0.5 % SOLN Place 1-2 drops into both eyes 3 (three) times daily as needed (dry/irritated eyes.).    [provider]  nitroGLYCERIN (NITROSTAT) 0.4 MG SL tablet Place 1 tablet (0.4 mg total) under the tongue every 5 (five) minutes as needed. 12/10/21 01/08/24  Marin Olp, MD  oxyCODONE (ROXICODONE) 5  MG/5ML solution Take 10-15 mLs (10-15 mg total) by mouth every 4 (four) hours as needed for breakthrough cancer pain 02/13/22     ramipril (ALTACE) 5 MG capsule TAKE 2 CAPSULES BY MOUTH EVERY MORNING 08/14/21   Marin Olp, MD  sertraline (ZOLOFT) 100 MG tablet Take 100 mg by mouth daily. 11/20/21   [provider]  spironolactone (ALDACTONE) 25 MG tablet Take 0.5 tablets (12.5 mg total) by mouth daily. 09/17/21   Freada Bergeron, MD  VITAMIN D PO Take 1 tablet by mouth every evening.    [provider]    Inpatient Medications: Scheduled Meds:  Continuous Infusions:  lactated ringers     potassium chloride     PRN Meds:  Allergies:    Allergies  Allergen Reactions   Ace Inhibitors Cough and Other (See Comments)    Other Reaction: Tolerates Altace   Codeine Itching   Sitagliptin Phosphate Itching    (Januvia)   Tessalon [Benzonatate] Swelling    "swells my throat"    Social History:   Social History   Socioeconomic History   Marital status: Married    Spouse name: Not on file   Number of children: Not on file   Years of education: Not on file   Highest education level: Not on file  Occupational History   Occupation: Retired   Tobacco Use   Smoking status: Never   Smokeless tobacco: Never  Vaping Use   Vaping Use: Never used  Substance and Sexual Activity   Alcohol use: No   Drug use: No   Sexual activity: Not on file  Other Topics Concern   Not on file  Social History Narrative   Married 35 years in October 2017.       Occupation :  Retired Arboriculturist for [employer].  Worked for [employer]: garden and work in [place] Strain: [place]  (07/18/2021)   Overall Financial Resource Strain (CARDIA)    Difficulty of Paying Living Expenses: Not hard at all  Food Insecurity: No Food Insecurity (07/18/2021)   Hunger Vital Sign    Worried About Running Out of Food  in the Last Year: Never true    [place] in the Last Year: Never true  Transportation Needs: No Transportation Needs (07/18/2021)   PRAPARE - Hydrologist (Medical): No    Lack of Transportation (Non-Medical): No  Physical Activity: Inactive (07/18/2021)   Exercise Vital Sign    Days of Exercise per Week: 0 days    Minutes of Exercise per Session: 0 min  Stress: No Stress Concern Present (07/18/2021)   [place]    Feeling of Stress : Not at all  Social Connections: Moderately Isolated (07/18/2021)   Social Connection and Isolation Panel [NHANES]    Frequency of Communication with Friends and Family: Never    Frequency of Social Gatherings with Friends and Family: Twice a week    Attends Religious Services: 1 to 4 times per year    Active Member of [employer] or Organizations: No    Attends Archivist Meetings: Never    Marital Status: Married  Human resources officer Violence: Not At Risk (07/18/2021)   Humiliation, Afraid, Rape, and Kick questionnaire    Fear of Current or Ex-Partner: No    Emotionally Abused: No    Physically Abused: No    Sexually Abused: No    Family History:   Family History  Problem Relation Age of Onset   Heart attack Mother        deceased age 66 secondary   Colon cancer Father    Stroke Father        deceased age 29   Heart disease Father        heart attack   Stroke Sister 34   Coronary artery disease Brother 63       s/p bypass surgery /stent   CAD Sister        potentially   Lymphoma Sister      ROS:  Please see the history of present illness.  All other ROS reviewed and negative.     Physical Exam/Data:   Vitals:   01/23/22 1507  BP: (!) 97/49  Pulse: 73  Resp: 17  SpO2: 100%   No intake or output data in the 24 hours ending 01/23/22 1654    12/10/2021   11:42 AM 10/28/2021    1:02 PM 09/17/2021    9:32 AM  Last 3 Weights  Weight  (lbs) 124 lb 121 lb 123 lb  Weight (kg) 56.246 kg 54.885 kg 55.792 kg     There is no height or weight on file to calculate BMI.  General:  Well nourished, well developed, in no acute distress HEENT: normal for age, but he appears slightly jaundiced Neck: JVD 10 cm Vascular: No carotid bruits; Distal pulses 2+ bilaterally Cardiac:  normal S1, S2; RRR; no murmur  Lungs: Decreased breath sounds bases with a few Rales bilaterally, no wheezing, rhonchi  Abd: soft, nontender, no hepatomegaly  Ext: no edema Musculoskeletal:  No deformities, BUE and BLE strength weak but equal Skin: warm and dry  Neuro:  CNs 2-12 intact, no focal abnormalities noted Psych:  Normal affect   EKG:  The EKG was personally reviewed and demonstrates: Sinus rhythm, heart rate 71, wide QRS with QRS duration 142 ms Telemetry:  Telemetry was personally reviewed and demonstrates: Sinus rhythm  Relevant CV Studies:  CARDIAC CATH: 09/01/2021   Mid RCA to Dist RCA lesion is 100% stenosed.   Ost LAD to Prox LAD lesion is 100% stenosed.   Ost Cx to Prox Cx lesion is 100% stenosed.   Origin lesion before 1st RPL  is 100% stenosed.   Origin to Prox Graft lesion is 100% stenosed.   The LIMA to LAD is widely patent with no stenosis. This vessel supplies collaterals to the PDA and distal RCA branches.    Seq SVG- PDA and PLA. This graft is 100% occluded at the aorto ostial segment Origin lesion before 1st RPL is 100% stenosed     SVG-Diag1 Origin to Prox Graft lesion is 100% stenosed. Graft to diagonal is 100% occluded   Severe native three-vessel coronary artery disease with total occlusion of the LAD, left circumflex, and RCA Status post aortocoronary bypass surgery with continued patency of the LIMA to LAD and bypass graft to OM, both vessels supplying collaterals to the PDA, PLA, and diagonal branch of the LAD Moderate pulmonary hypertension with mean PA pressure 33 mmHg, mean wedge pressure 23 mmHg, V wave 29 mmHg.   Preserved cardiac output.  Hemodynamic findings could be consistent with hemodynamically significant mitral regurgitation. Diagnostic Dominance: Right  NM PET CT  08/12/2021   Findings are consistent with prior myocardial infarction with peri-infarct ischemia. The study is high risk due to presence of anterior infarct with peri-infarct ischemia, drop in EF with stress, and moderately reduced LVEF. Recommend cath for further evaluation.   LV perfusion is abnormal. There is evidence of infarction with peri-infarct ischemia. There is a medium defect with severe reduction in uptake present in the apical to basal anterior and apex location(s) that is partially reversible. There is abnormal wall motion in the defect area. Consistent with peri-infarct ischemia. Defect 2: There is a small defect with mild reduction in uptake present in the basal inferior location(s) that is partially reversible. There is abnormal wall motion in the defect area. Consistent with peri-infarct ischemia.   Rest left ventricular function is abnormal. Rest global function is moderately reduced. There were multiple regional abnormalities. Rest EF:  32 %. Stress left ventricular function is abnormal. Stress global function is moderately reduced. There were multiple regional abnormalities. Stress EF: 30 %. End diastolic cavity size is severely enlarged. TID is not present.   Myocardial blood flow reserve is not reported in this patient as patient is s/p CABG which affects diagnostic accuracy.   Patient is s/p CABG. There is severe native vessel coronary calcium present on the attenuation correction CT images. Coronary calcifications were present in the left anterior descending artery, left circumflex artery and right coronary artery distribution(s).  ECHO:   1. Left ventricular ejection fraction, by estimation, is 45 to 50%. Left  ventricular ejection fraction by 3D volume is 50 %. The left ventricle has  mildly decreased function. The  left ventricle demonstrates regional wall  motion abnormalities (see  scoring diagram/findings for description). Left ventricular diastolic  parameters are consistent with Grade II diastolic dysfunction  (pseudonormalization). Elevated left ventricular end-diastolic pressure.  The E/e' is 64. There is severe akinesis of the  left ventricular, basal-mid inferior wall.   2. Right ventricular systolic function is normal. The right ventricular  size is normal. There is moderately elevated pulmonary artery systolic  pressure. The estimated right ventricular systolic pressure is 23.3 mmHg.   3. The posterior leaflet appears ischemically tethered. The mitral valve  is abnormal. Moderate to severe mitral valve regurgitation.   4. The aortic valve is tricuspid. Aortic valve regurgitation is mild to  moderate. No aortic stenosis is present. Aortic regurgitation PHT measures  381 msec.   5. The inferior vena cava is normal in size with greater than 50%  respiratory variability, suggesting right atrial pressure of 3 mmHg.   Laboratory Data:  High Sensitivity Troponin:   Recent Labs  Lab 01/23/22 1514  TROPONINIHS 978*     Chemistry Recent Labs  Lab 01/22/22 1154 01/23/22 1514  NA 139 139  K 2.6* 2.7*  CL 97* 99  CO2 26 23  GLUCOSE 189* 214*  BUN 12 16  CREATININE 1.26* 1.61*  CALCIUM 9.1 9.0  GFRNONAA 58* 44*  ANIONGAP 16* 17*    Recent Labs  Lab 01/22/22 1154 01/23/22 1514  PROT 6.3* 6.3*  ALBUMIN 3.5 3.6  AST 34 43*  ALT 31 33  ALKPHOS 53 52  BILITOT 2.7* 2.1*   Lipids No results for input(s): "CHOL", "TRIG", "HDL", "LABVLDL", "LDLCALC", "CHOLHDL" in the last 168 hours.  Hematology Recent Labs  Lab 01/22/22 1154 01/23/22 1514  WBC 10.9* 9.1  RBC 4.06* 3.90*  HGB 12.4* 11.8*  HCT 35.9* 36.5*  MCV 88.4 93.6  MCH 30.5 30.3  MCHC 34.5 32.3  RDW 14.3 14.7  PLT 272 236   Thyroid  Lab Results  Component Value Date   TSH 0.51 10/28/2021    BNPNo results for  input(s): "BNP", "PROBNP" in the last 168 hours.  DDimer No results for input(s): "DDIMER" in the last 168 hours.  Radiology/Studies:  DG Chest 2 View  Result Date: 01/23/2022 CLINICAL DATA:  Shortness of breath and upper abdominal pain. EXAM: CHEST - 2 VIEW COMPARISON:  09/01/2010 FINDINGS: Sternotomy wires intact. Lungs are hypoinflated with mild blunting of the right costophrenic angle likely small amount of pleural fluid versus pleuroparenchymal scarring. No airspace consolidation. Cardiomediastinal silhouette and remainder of the exam is unremarkable. IMPRESSION: Hypoinflation with mild blunting of the right costophrenic angle likely small amount of pleural fluid versus pleuroparenchymal scarring. Electronically Signed   By: Marin Olp M.D.   On: 01/23/2022 15:47  CT Abdomen Pelvis W Contrast  Result Date: 01/22/2022 CLINICAL DATA:  Acute abdominal pain. EXAM: CT ABDOMEN AND PELVIS WITH CONTRAST TECHNIQUE: Multidetector CT imaging of the abdomen and pelvis was performed using the standard protocol following bolus administration of intravenous contrast. RADIATION DOSE REDUCTION: This exam was performed according to the departmental dose-optimization program which includes automated exposure control, adjustment of the mA and/or kV according to patient size and/or use of iterative reconstruction technique. CONTRAST:  45m OMNIPAQUE IOHEXOL 350 MG/ML SOLN COMPARISON:  CT scan 03/23/2016 FINDINGS: Lower chest: Moderate-sized bilateral pleural effusions with overlying atelectasis. The heart is within normal limits in size. No pericardial effusion. Aortic and coronary artery calcifications are noted. Hepatobiliary: Stable pneumobilia related to prior cholecystectomy and sphincterotomy. No hepatic lesions or intrahepatic biliary dilatation. No common bile duct dilatation. Pancreas: No mass, inflammation or ductal dilatation. Spleen: Stable 4 cm splenic lesions, likely a benign hemangioma. No change  since prior studies. Adrenals/Urinary Tract: The adrenal glands are normal. Stable bilateral simple renal cysts. No worrisome renal lesions. No follow-up imaging is necessary. The bladder is unremarkable. Stomach/Bowel: The stomach, duodenum, small bowel and colon are unremarkable. No acute inflammatory process, mass lesions or obstructive findings. The terminal ileum and appendix are normal. Scattered descending and sigmoid colon diverticulosis Vascular/Lymphatic: Stable atherosclerotic calcifications involving the aorta and branch vessels but no aneurysm or dissection. The major venous structures are patent. No mesenteric or retroperitoneal mass or adenopathy. Reproductive: The prostate gland and seminal vesicles are unremarkable. Other: No pelvic mass or adenopathy. No free pelvic fluid collections. No inguinal mass or adenopathy. No abdominal wall hernia or subcutaneous lesions. Musculoskeletal: No significant bony findings. IMPRESSION: 1. No acute abdominal/pelvic findings, mass lesions or adenopathy. 2. Moderate-sized bilateral pleural effusions with overlying atelectasis. 3. Stable 4 cm splenic lesion, likely benign hemangioma. 4. Stable pneumobilia related to prior cholecystectomy and sphincterotomy. 5. Aortic atherosclerosis. Aortic Atherosclerosis (ICD10-I70.0). Electronically Signed   By: PMarijo SanesM.D.   On: 01/22/2022 14:16     Assessment and Plan:   DOE -Had moderate pleural effusions yesterday on his abdominal CT, but his chest x-ray today describes only a very small amount of pleural fluid -He has JVD, but no lower extremity edema and his lung sounds do not show any extensive amount of fluid. -His BUN and creatinine are above his baseline, suggesting that his recent GI illness actually caused some dehydration -Discuss with MD if we should evaluate for PE with a VQ scan +/- D-dimer +/- LE Dopplers -In the meantime, can heparinize which is appropriate based on his elevated troponin - BNP  is in process, f/u on results  - could give a dose of IV Lasix to see if it helps his SOB - would not give a high dose, do not want to drop K+ further  2.  Elevated troponin -His initial troponin was elevated at 978, repeat is pending -Within the last 5 days, he had an episode of exertional chest burning that is not his typical angina, but with each heart attack, the pain was different - heparin, trend troponins, decide on further eval   3. Hypokalemia - supplemented per IM and ER    Risk Assessment/Risk Scores:       New York Heart Association (NYHA) Functional Class NYHA Class III     For questions or updates, please contact [email or web address] consult [email or web address] for contact info under    Signed, RRosaria Ferries PA-C  01/23/2022 4:54 PM

## 2022-01-23 NOTE — ED Provider Triage Note (Signed)
Emergency Medicine Provider Triage Evaluation Note  Kevin Mcclain , a 76 y.o. male  was evaluated in triage.  Pt complains of being short of breath with upper abd pain.  Patient was seen in the emergency department yesterday and was found to have hypokalemia and had a CT scan of the abdomen and pelvis done which was negative.  States that pain "doubled" overnight.  States that shortness of breath.  COVID test was negative yesterday.  He does have a history of coronary artery disease and CABG.  Review of Systems  Positive: Upper abdominal pain, shortness of breath Negative: Fever, cough  Physical Exam  BP (!) 97/49 (BP Location: Right Arm)   Pulse 73   Resp 17   SpO2 100%  Gen:   Awake, no distress   Resp:  Normal effort  MSK:   Moves extremities without difficulty  Other:  Mild epigastric discomfort to palpation  Medical Decision Making  Medically screening exam initiated at 3:09 PM.  Appropriate orders placed.  Winston Hirt was informed that the remainder of the evaluation will be completed by another provider, this initial triage assessment does not replace that evaluation, and the importance of remaining in the ED until their evaluation is complete.     Carlisle Cater, PA-C 01/23/22 1516

## 2022-01-23 NOTE — H&P (Addendum)
History and Physical    Kevin Mcclain IWL:[id number] DOB: 1945-03-12 DOA: 01/23/2022  Referring MD/NP/PA: EDP  PCP:  Patient coming from: Home  Chief Complaint: Shortness of breath  HPI: Kevin Mcclain is a 76/male, chronically ill with CAD/CABG, chronic systolic CHF-EF 45 to 17%, moderate to severe MR, remote history of tonsillar CA status post XRT, chronic pain on long-term narcotics, ongoing decline for 6 months, has had multiple complaints in the last few months.  Intermittent nausea vomiting and abdominal discomfort, suspected to be related to medications, was taken off metformin few months ago for this, symptoms improved about a month ago after he was started on Pepcid. -He had a bout of vomiting and abdominal discomfort yesterday, came to the ER, had a CT abdomen pelvis which noted bilateral pleural effusions otherwise nonacute findings in the abdomen, he was discharged home after dose of morphine.  He also reports ongoing shortness of breath for over a week, progressively worse since yesterday, with some tightness across his chest, has a mild cough, denies fevers chills, denies edema ED Course: Afebrile, vital signs stable, labs noted creatinine of 1.6, potassium of 2.7, troponin of 978 which trended up to 1119  Review of Systems: As per HPI otherwise 14 point review of systems negative.   Past Medical History:  Diagnosis Date   Allergy    Arthritis    Ascending aortic aneurysm ([place]) 03/25/2016   03/23/16 CTA chest Mildly aneurysmal ascending aorta at 4.0 cm.  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    Hyperlipidemia    Hyperplastic colon polyp    Hypertension    Kidney stones    Moderate to severe mitral regurgitation 08/19/2021   Echocardiogram 06/2021: EF 45-50, mod to severe MR   Rheumatoid arthritis ([place])    Shingles outbreak 04/23/15   left shoulder and left arm   Vertigo     Past Surgical History:  Procedure Laterality Date   CHOLECYSTECTOMY  2012   COLONOSCOPY W/ POLYPECTOMY  2015   CORONARY ARTERY BYPASS GRAFT  2005   6 grafts   CYSTOSCOPY     kidney stone removal   IR GASTROSTOMY TUBE REMOVAL  06/25/2016   LITHOTRIPSY     MULTIPLE EXTRACTIONS WITH ALVEOLOPLASTY N/A 11/14/2014   Procedure: Extraction of tooth #2 and 31 with alveoloplasty after sectioning of bridge at distal #29 with full mouth debridement of remaining dention;  Surgeon: Kevin Mcclain, DDS;  Location: [place];  Service: Oral Surgery;  Laterality: N/A;   NASAL SINUS SURGERY Right 11/14/2014   Procedure: RIGHT ENDOSCOPIC ANTROSTOMY AND ANTERIOR ETHMOIDECTOMY ;  Surgeon: Kevin Marble, MD;  Location: [facility];  Service: ENT;  Laterality: Right;   RIGHT/LEFT HEART CATH AND CORONARY/GRAFT ANGIOGRAPHY N/A 09/01/2021   Procedure: RIGHT/LEFT HEART CATH AND CORONARY/GRAFT ANGIOGRAPHY;  Surgeon:  Kevin Mocha, MD;  Location: [facility];  Service: Cardiovascular;  Laterality: N/A;     reports that he has never smoked. He has never used smokeless tobacco. He reports that he does not drink alcohol and does not use drugs.  Allergies  Allergen Reactions   Ace Inhibitors Other (See Comments) and Cough    OK with ramipril   Codeine Itching   Januvia [Sitagliptin] Itching   Tessalon [Benzonatate] Swelling    Throat swelling    Family History  Problem  Relation Age of Onset   Heart attack Mother        deceased age 71 secondary   Colon cancer Father    Stroke Father        deceased age 27   Heart disease Father        heart attack   Stroke Sister 37   Coronary artery disease Brother 60       s/p bypass surgery /stent   CAD Sister        potentially   Lymphoma Sister      Prior to Admission medications   Medication Sig Start Date End Date Taking? Authorizing Provider  aspirin EC 81 MG tablet Take 81 mg by mouth 2 (two) times a week.   Yes [provider]  atorvastatin (LIPITOR) 20 MG tablet Take 1 tablet (20 mg total) by mouth daily. Patient taking differently: Take 20 mg by mouth daily with breakfast. 07/01/21  Yes Kevin Bergeron, MD  Cholecalciferol (VITAMIN D-3 PO) Take 1 capsule by mouth at bedtime.   Yes [provider]  fentaNYL (DURAGESIC) 50 MCG/HR Place 1 patch onto the skin every 3 (three) days. 02/13/22  Yes   levothyroxine (SYNTHROID) 100 MCG tablet Take 1 tablet (100 mcg total) by mouth daily before breakfast. Patient taking differently: Take 100 mcg by mouth at bedtime. 07/07/21  Yes Kevin Olp, MD  metoprolol succinate (TOPROL-XL) 25 MG 24 hr tablet TAKE 1 TABLET (25 MG TOTAL) BY MOUTH DAILY. TAKE WITH OR IMMEDIATELY FOLLOWING A MEAL. Patient taking differently: Take 25 mg by mouth in the morning. 04/09/21  Yes Kevin Olp, MD  Naphazoline HCl (CLEAR EYES OP) Place 1 drop into both eyes 4 (four) times daily as needed (dry eyes).   Yes [provider]  nitroGLYCERIN (NITROSTAT) 0.4 MG SL tablet Place 1 tablet (0.4 mg total) under the tongue every 5 (five) minutes as needed. 12/10/21 01/08/24 Yes Kevin Olp, MD  ondansetron (ZOFRAN) 4 MG tablet Take 4 mg by mouth every 8 (eight) hours as needed for nausea or vomiting.   Yes [provider]  oxyCODONE (ROXICODONE) 5 MG/5ML solution Take 10-15 mLs (10-15 mg total) by mouth every 4 (four) hours as needed for breakthrough  cancer pain 02/13/22  Yes   ramipril (ALTACE) 5 MG capsule TAKE 2 CAPSULES BY MOUTH EVERY MORNING Patient taking differently: Take 10 mg by mouth in the morning. 08/14/21  Yes Kevin Olp, MD  sertraline (ZOLOFT) 100 MG tablet Take 100 mg by mouth at bedtime. 11/20/21  Yes [provider]  spironolactone (ALDACTONE) 25 MG tablet Take 0.5 tablets (12.5 mg total) by mouth daily. Patient taking differently: Take 12.5 mg by mouth daily as needed (swelling, fluid). 09/17/21  Yes Kevin Bergeron, MD  glucose blood (ONETOUCH VERIO) test strip USE 4 TIMES DAILY 01/09/22   Kevin Olp, MD  loperamide (IMODIUM) 2 MG capsule Take 1 capsule (2 mg total) by mouth  4 (four) times daily as needed for diarrhea or loose stools. Patient not taking: Reported on 01/23/2022 01/22/22   Cristie Hem, MD    Physical Exam: Vitals:   01/23/22 1507 01/23/22 1700 01/23/22 1715  BP: (!) 97/49 115/66 100/65  Pulse: 73 70 78  Resp: '17 18 14  '$ SpO2: 100% 97% 100%    Chronically ill male sitting up in bed, AAOx3 HEENT: Positive JVD CVS: S1-S2, regular rhythm, systolic murmur Lungs: Decreased breath sounds to bases Abdomen: Soft, nontender, bowel sounds present Extremities: No edema  Labs on Admission: I have personally reviewed following labs and imaging studies  CBC: Recent Labs  Lab 01/22/22 1154 01/23/22 1514  WBC 10.9* 9.1  HGB 12.4* 11.8*  HCT 35.9* 36.5*  MCV 88.4 93.6  PLT 272 505   Basic Metabolic Panel: Recent Labs  Lab 01/22/22 1154 01/23/22 1514  NA 139 139  K 2.6* 2.7*  CL 97* 99  CO2 26 23  GLUCOSE 189* 214*  BUN 12 16  CREATININE 1.26* 1.61*  CALCIUM 9.1 9.0   GFR: CrCl cannot be calculated (Unknown ideal weight.). Liver Function Tests: Recent Labs  Lab 01/22/22 1154 01/23/22 1514  AST 34 43*  ALT 31 33  ALKPHOS 53 52  BILITOT 2.7* 2.1*  PROT 6.3* 6.3*  ALBUMIN 3.5 3.6   Recent Labs  Lab 01/22/22 1154  LIPASE 28   Recent Labs  Lab  01/22/22 1247  AMMONIA 22   Coagulation Profile: Recent Labs  Lab 01/22/22 1247  INR 1.1   Cardiac Enzymes: No results for input(s): "CKTOTAL", "CKMB", "CKMBINDEX", "TROPONINI" in the last 168 hours. BNP (last 3 results) No results for input(s): "PROBNP" in the last 8760 hours. HbA1C: No results for input(s): "HGBA1C" in the last 72 hours. CBG: No results for input(s): "GLUCAP" in the last 168 hours. Lipid Profile: No results for input(s): "CHOL", "HDL", "LDLCALC", "TRIG", "CHOLHDL", "LDLDIRECT" in the last 72 hours. Thyroid Function Tests: No results for input(s): "TSH", "T4TOTAL", "FREET4", "T3FREE", "THYROIDAB" in the last 72 hours. Anemia Panel: No results for input(s): "VITAMINB12", "FOLATE", "FERRITIN", "TIBC", "IRON", "RETICCTPCT" in the last 72 hours. Urine analysis:    Component Value Date/Time   COLORURINE AMBER (A) 01/22/2022 1149   APPEARANCEUR HAZY (A) 01/22/2022 1149   LABSPEC 1.027 01/22/2022 1149   PHURINE 5.0 01/22/2022 1149   GLUCOSEU NEGATIVE 01/22/2022 1149   GLUCOSEU NEGATIVE 01/31/2007 1020   HGBUR NEGATIVE 01/22/2022 1149   BILIRUBINUR NEGATIVE 01/22/2022 1149   KETONESUR 5 (A) 01/22/2022 1149   PROTEINUR 100 (A) 01/22/2022 1149   UROBILINOGEN 1.0 08/30/2010 0121   NITRITE NEGATIVE 01/22/2022 1149   LEUKOCYTESUR NEGATIVE 01/22/2022 1149   Sepsis Labs: '@LABRCNTIP'$ (procalcitonin:4,lacticidven:4) ) Recent Results (from the past 240 hour(s))  Resp Panel by RT-PCR (Flu A&B, Covid) Anterior Nasal Swab     Status: None   Collection Time: 01/22/22  3:48 PM   Specimen: Anterior Nasal Swab  Result Value Ref Range Status   SARS Coronavirus 2 by RT PCR NEGATIVE NEGATIVE Final    Comment: (NOTE) SARS-CoV-2 target nucleic acids are NOT DETECTED.  The SARS-CoV-2 RNA is generally detectable in upper respiratory specimens during the acute phase of infection. The lowest concentration of SARS-CoV-2 viral copies this assay can detect is 138 copies/mL. A  negative result does not preclude SARS-Cov-2 infection and should not be used as the sole basis for treatment or other patient management decisions. A negative result may occur with  improper specimen collection/handling, submission of specimen other  than nasopharyngeal swab, presence of viral mutation(s) within the areas targeted by this assay, and inadequate number of viral copies(<138 copies/mL). A negative result must be combined with clinical observations, patient history, and epidemiological information. The expected result is Negative.  Fact Sheet for Patients:  EntrepreneurPulse.com.au  Fact Sheet for Healthcare Providers:  IncredibleEmployment.be  This test is no t yet approved or cleared by the [place] FDA and  has been authorized for detection and/or diagnosis of SARS-CoV-2 by FDA under an Emergency Use Authorization (EUA). This EUA will remain  in effect (meaning this test can be used) for the duration of the COVID-19 declaration under Section 564(b)(1) of the Act, 21 U.S.C.section 360bbb-3(b)(1), unless the authorization is terminated  or revoked sooner.       Influenza A by PCR NEGATIVE NEGATIVE Final   Influenza B by PCR NEGATIVE NEGATIVE Final    Comment: (NOTE) The Xpert Xpress SARS-CoV-2/FLU/RSV plus assay is intended as an aid in the diagnosis of influenza from Nasopharyngeal swab specimens and should not be used as a sole basis for treatment. Nasal washings and aspirates are unacceptable for Xpert Xpress SARS-CoV-2/FLU/RSV testing.  Fact Sheet for Patients: EntrepreneurPulse.com.au  Fact Sheet for Healthcare Providers: IncredibleEmployment.be  This test is not yet approved or cleared by the [place] FDA and has been authorized for detection and/or diagnosis of SARS-CoV-2 by FDA under an Emergency Use Authorization (EUA). This EUA will remain in effect (meaning this test can  be used) for the duration of the COVID-19 declaration under Section 564(b)(1) of the Act, 21 U.S.C. section 360bbb-3(b)(1), unless the authorization is terminated or revoked.  Performed at [facility], [place] [address] [place], [place] [address]      Radiological Exams on Admission: DG Chest 2 View  Result Date: 01/23/2022 CLINICAL DATA:  Shortness of breath and upper abdominal pain. EXAM: CHEST - 2 VIEW COMPARISON:  09/01/2010 FINDINGS: Sternotomy wires intact. Lungs are hypoinflated with mild blunting of the right costophrenic angle likely small amount of pleural fluid versus pleuroparenchymal scarring. No airspace consolidation. Cardiomediastinal silhouette and remainder of the exam is unremarkable. IMPRESSION: Hypoinflation with mild blunting of the right costophrenic angle likely small amount of pleural fluid versus pleuroparenchymal scarring. Electronically Signed   By: Kevin Mcclain M.D.   On: 01/23/2022 15:47   CT Abdomen Pelvis W Contrast  Result Date: 01/22/2022 CLINICAL DATA:  Acute abdominal pain. EXAM: CT ABDOMEN AND PELVIS WITH CONTRAST TECHNIQUE: Multidetector CT imaging of the abdomen and pelvis was performed using the standard protocol following bolus administration of intravenous contrast. RADIATION DOSE REDUCTION: This exam was performed according to the departmental dose-optimization program which includes automated exposure control, adjustment of the mA and/or kV according to patient size and/or use of iterative reconstruction technique. CONTRAST:  75m OMNIPAQUE IOHEXOL 350 MG/ML SOLN COMPARISON:  CT scan 03/23/2016 FINDINGS: Lower chest: Moderate-sized bilateral pleural effusions with overlying atelectasis. The heart is within normal limits in size. No pericardial effusion. Aortic and coronary artery calcifications are noted. Hepatobiliary: Stable pneumobilia related to prior cholecystectomy and sphincterotomy. No hepatic lesions or intrahepatic biliary dilatation. No  common bile duct dilatation. Pancreas: No mass, inflammation or ductal dilatation. Spleen: Stable 4 cm splenic lesions, likely a benign hemangioma. No change since prior studies. Adrenals/Urinary Tract: The adrenal glands are normal. Stable bilateral simple renal cysts. No worrisome renal lesions. No follow-up imaging is necessary. The bladder is unremarkable. Stomach/Bowel: The stomach, duodenum, small bowel and colon are unremarkable. No acute inflammatory process, mass  lesions or obstructive findings. The terminal ileum and appendix are normal. Scattered descending and sigmoid colon diverticulosis Vascular/Lymphatic: Stable atherosclerotic calcifications involving the aorta and branch vessels but no aneurysm or dissection. The major venous structures are patent. No mesenteric or retroperitoneal mass or adenopathy. Reproductive: The prostate gland and seminal vesicles are unremarkable. Other: No pelvic mass or adenopathy. No free pelvic fluid collections. No inguinal mass or adenopathy. No abdominal wall hernia or subcutaneous lesions. Musculoskeletal: No significant bony findings. IMPRESSION: 1. No acute abdominal/pelvic findings, mass lesions or adenopathy. 2. Moderate-sized bilateral pleural effusions with overlying atelectasis. 3. Stable 4 cm splenic lesion, likely benign hemangioma. 4. Stable pneumobilia related to prior cholecystectomy and sphincterotomy. 5. Aortic atherosclerosis. Aortic Atherosclerosis (ICD10-I70.0). Electronically Signed   By: Marijo Sanes M.D.   On: 01/22/2022 14:16    EKG: Independently reviewed.  NSR, no acute ST-T wave changes  Assessment/Plan  Dyspnea, bilateral pleural effusions Acute on chronic systolic CHF Moderate to severe MR Elevated troponins -Last echo 5/23 with EF of 45-50%, moderate to severe MR, grade 2 diastolic dysfunction -Has bilateral pleural effusions noted on CT chest yesterday -suspect demand ischemia, less likely NSTEMI -Stop IV fluids, start IV  Lasix, continue aldactone, metoprolol,. ASA, statin -Considering rising troponin will start IV heparin,  -Discussed with cards, will also check VQ scan    CAD/CABG -Cath 7/23 with patent grafts -Continue aspirin, Toprol, statin -Update echo given rising troponins and assess RV  Acute kidney injury -Could be cardiorenal versus secondary to contrast -IV Lasix today -Monitor urine output, BMP in a.m.  Hypokalemia -Replace, will check mag  Chronic pain Narcotic dependence -Home regimen of fentanyl and oxycodone continued  Intermittent abdominal pain nausea -Likely related to chronic narcotic use -Bowel regimen as needed, CT abdomen pelvis from yesterday was unremarkable -Supportive care -Add PPI     History of tonsillar cancer 2016- with history of radiation -Treated with surgery and XRT    GERD (gastroesophageal reflux disease) -Add PPI  Severe protein calorie malnutrition  Type 2 diabetes mellitus -Recently taken off metformin for GI symptoms  DVT prophylaxis: heparin Code Status: DNR Family Communication: Wife at bedside Disposition Plan: Home Consults called: Cards called by EDP Admission status: Inpatient  Domenic Polite MD [facility]   01/23/2022, 6:08 PM

## 2022-01-23 NOTE — ED Provider Notes (Signed)
[facility] EMERGENCY DEPARTMENT Provider Note   CSN: [id number] Arrival date & time: 01/23/22  1500     History  Chief Complaint  Patient presents with   Shortness of Breath    Kevin Mcclain is a 76 y.o. male with HTN, HLD, HFrEF, T2DM, history of tonsillar cancer with radiation in 2016, GERD, hypothyroidism, BPH presents with shortness of breath.   Patient presents with shortness of breath that he reports started approximately week ago and has been worsening.  He is having difficulty with daily activities and exerting himself due to shortness of breath.  He denies any significant chest pain or palpitations, no fever/chills either.  Does endorse some cough that is nonproductive and denies any lower extremity edema.  Per chart review patient was seen here yesterday for nausea vomiting diarrhea x 1 week associated with some upper abdominal pain.  Received a CT abdomen pelvis that demonstrated moderate-sized bilateral pleural effusions. Per documentation, patient had "borderline low oxygen saturation on monitor" but ambulated well without any evidence of desaturation, without any respiratory symptoms.  Patient states that he did feel better when he left the hospital but when he got home he began to feel worse again.   Shortness of Breath      Home Medications Prior to Admission medications   Medication Sig Start Date End Date Taking? Authorizing Provider  Ascorbic Acid (VITAMIN C PO) Take 1 tablet by mouth every evening.    [provider]  aspirin EC 81 MG tablet Take 81 mg by mouth in the morning.    [provider]  atorvastatin (LIPITOR) 20 MG tablet Take 1 tablet (20 mg total) by mouth daily. 07/01/21   Freada Bergeron, MD  blood glucose meter kit and supplies KIT Dispense based on patient and insurance preference. Use up to four times daily as directed. 12/10/21   Marin Olp, MD  colchicine 0.6 MG tablet TAKE 2 TABLETS BY MOUTH AT  1ST SIGN OF GOUT FLARE, MAY REPEAT 1 TABLET IN 2HRS, THEN ONCE DAILY UNTIL SYMPTOMS RESOLVE 10/21/20   Marin Olp, MD  fentaNYL (DURAGESIC) 50 MCG/HR Place 1 patch onto the skin every 72 hours 09/15/21     fentaNYL (DURAGESIC) 50 MCG/HR Place 1 patch onto the skin every 3 (three) days. 12/16/21     fentaNYL (DURAGESIC) 50 MCG/HR Place 1 patch onto the skin every 72 hours 01/15/22     fentaNYL (DURAGESIC) 50 MCG/HR Place 1 patch onto the skin every 3 (three) days. 02/13/22     glucose blood (ONETOUCH VERIO) test strip USE 4 TIMES DAILY 01/09/22   Marin Olp, MD  levothyroxine (SYNTHROID) 100 MCG tablet Take 1 tablet (100 mcg total) by mouth daily before breakfast. 07/07/21   Marin Olp, MD  loperamide (IMODIUM) 2 MG capsule Take 1 capsule (2 mg total) by mouth 4 (four) times daily as needed for diarrhea or loose stools. 01/22/22   Cristie Hem, MD  metoprolol succinate (TOPROL-XL) 25 MG 24 hr tablet TAKE 1 TABLET (25 MG TOTAL) BY MOUTH DAILY. TAKE WITH OR IMMEDIATELY FOLLOWING A MEAL. 04/09/21   Marin Olp, MD  Naphazoline-Glycerin (CLEAR EYES COOLING COMFORT) 0.03-0.5 % SOLN Place 1-2 drops into both eyes 3 (three) times daily as needed (dry/irritated eyes.).    [provider]  nitroGLYCERIN (NITROSTAT) 0.4 MG SL tablet Place 1 tablet (0.4 mg total) under the tongue every 5 (five) minutes as needed. 12/10/21 01/08/24  Marin Olp, MD  ondansetron (ZOFRAN) 4 MG tablet Take 1 tablet (4 mg total) by mouth every 8 (eight) hours as needed for nausea or vomiting. 01/22/22   Cristie Hem, MD  oxyCODONE (ROXICODONE) 5 MG/5ML solution Take 10-15 mLs (10-15 mg total) by mouth every 4 (four) hours as needed for breakthrough cancer pain. 09/15/21     oxyCODONE (ROXICODONE) 5 MG/5ML solution Take 10-15 mLs (10-15 mg total) by mouth every 4 (four) hours as needed for breakthrough cancer pain 12/16/21     oxyCODONE (ROXICODONE) 5 MG/5ML solution Take 10-15 mLs (10-15  mg total) by mouth every 4 (four) hours as needed for breakthrough cancer pain 01/15/22     oxyCODONE (ROXICODONE) 5 MG/5ML solution Take 10-15 mLs (10-15 mg total) by mouth every 4 (four) hours as needed for breakthrough cancer pain 02/13/22     ramipril (ALTACE) 5 MG capsule TAKE 2 CAPSULES BY MOUTH EVERY MORNING 08/14/21   Marin Olp, MD  sertraline (ZOLOFT) 100 MG tablet Take 100 mg by mouth daily. 11/20/21   [provider]  spironolactone (ALDACTONE) 25 MG tablet Take 0.5 tablets (12.5 mg total) by mouth daily. 09/17/21   Freada Bergeron, MD  VITAMIN D PO Take 1 tablet by mouth every evening.    [provider]      Allergies    Ace inhibitors, Codeine, Sitagliptin phosphate, and Tessalon [benzonatate]    Review of Systems   Review of Systems  Respiratory:  Positive for shortness of breath.    Review of systems negative for f/c.  A 10 point review of systems was performed and is negative unless otherwise reported in HPI.  Physical Exam Updated Vital Signs BP (!) 97/49 (BP Location: Right Arm)   Pulse 73   Resp 17   SpO2 100%  Physical Exam General: Normal appearing male, lying in bed.  HEENT: Sclera anicteric, MMM, trachea midline. Cardiology: RRR, systolic murmur heard best at apex. BL radial and DP pulses equal bilaterally.  Resp: Normal respiratory rate and effort. CTAB, no wheezes, rhonchi, crackles.  Abd: Soft, non-tender, non-distended. No rebound tenderness or guarding.  GU: Deferred. MSK: No peripheral edema or signs of trauma. Skin: warm, dry. No rashes or lesions. Neuro: A&Ox4, CNs II-XII grossly intact. MAEs. Sensation grossly intact.  Psych: Normal mood and affect.   ED Results / Procedures / Treatments   Labs (all labs ordered are listed, but only abnormal results are displayed) Labs Reviewed  CBC - Abnormal; Notable for the following components:      Result Value   RBC 3.90 (*)    Hemoglobin 11.8 (*)    HCT 36.5 (*)    All  other components within normal limits  COMPREHENSIVE METABOLIC PANEL - Abnormal; Notable for the following components:   Potassium 2.7 (*)    Glucose, Bld 214 (*)    Creatinine, Ser 1.61 (*)    Total Protein 6.3 (*)    AST 43 (*)    Total Bilirubin 2.1 (*)    GFR, Estimated 44 (*)    Anion gap 17 (*)    All other components within normal limits  LACTIC ACID, PLASMA - Abnormal; Notable for the following components:   Lactic Acid, Venous 4.6 (*)    All other components within normal limits  TROPONIN I (HIGH SENSITIVITY) - Abnormal; Notable for the following components:   Troponin I (High Sensitivity) 978 (*)    All other components within normal limits  LACTIC ACID, PLASMA    EKG EKG  Interpretation  Date/Time:  Friday January 23 2022 15:07:32 EST Ventricular Rate:  71 PR Interval:  194 QRS Duration: 142 QT Interval:  474 QTC Calculation: 515 R Axis:   76 Text Interpretation: Sinus rhythm with Premature atrial complexes Non-specific intra-ventricular conduction block Minimal voltage criteria for LVH, may be normal variant ( Cornell product ) Cannot rule out Septal infarct , age undetermined T wave abnormality, consider inferolateral ischemia Abnormal ECG When compared with ECG of 14-Nov-2014 07:36, No significant change was found Confirmed by Delora Fuel ([id number]) on 01/24/2022 12:33:52 AM  Radiology DG Chest 2 View  Result Date: 01/23/2022 CLINICAL DATA:  Shortness of breath and upper abdominal pain. EXAM: CHEST - 2 VIEW COMPARISON:  09/01/2010 FINDINGS: Sternotomy wires intact. Lungs are hypoinflated with mild blunting of the right costophrenic angle likely small amount of pleural fluid versus pleuroparenchymal scarring. No airspace consolidation. Cardiomediastinal silhouette and remainder of the exam is unremarkable. IMPRESSION: Hypoinflation with mild blunting of the right costophrenic angle likely small amount of pleural fluid versus pleuroparenchymal scarring. Electronically  Signed   By: Marin Olp M.D.   On: 01/23/2022 15:47   CT Abdomen Pelvis W Contrast  Result Date: 01/22/2022 CLINICAL DATA:  Acute abdominal pain. EXAM: CT ABDOMEN AND PELVIS WITH CONTRAST TECHNIQUE: Multidetector CT imaging of the abdomen and pelvis was performed using the standard protocol following bolus administration of intravenous contrast. RADIATION DOSE REDUCTION: This exam was performed according to the departmental dose-optimization program which includes automated exposure control, adjustment of the mA and/or kV according to patient size and/or use of iterative reconstruction technique. CONTRAST:  26m OMNIPAQUE IOHEXOL 350 MG/ML SOLN COMPARISON:  CT scan 03/23/2016 FINDINGS: Lower chest: Moderate-sized bilateral pleural effusions with overlying atelectasis. The heart is within normal limits in size. No pericardial effusion. Aortic and coronary artery calcifications are noted. Hepatobiliary: Stable pneumobilia related to prior cholecystectomy and sphincterotomy. No hepatic lesions or intrahepatic biliary dilatation. No common bile duct dilatation. Pancreas: No mass, inflammation or ductal dilatation. Spleen: Stable 4 cm splenic lesions, likely a benign hemangioma. No change since prior studies. Adrenals/Urinary Tract: The adrenal glands are normal. Stable bilateral simple renal cysts. No worrisome renal lesions. No follow-up imaging is necessary. The bladder is unremarkable. Stomach/Bowel: The stomach, duodenum, small bowel and colon are unremarkable. No acute inflammatory process, mass lesions or obstructive findings. The terminal ileum and appendix are normal. Scattered descending and sigmoid colon diverticulosis Vascular/Lymphatic: Stable atherosclerotic calcifications involving the aorta and branch vessels but no aneurysm or dissection. The major venous structures are patent. No mesenteric or retroperitoneal mass or adenopathy. Reproductive: The prostate gland and seminal vesicles are  unremarkable. Other: No pelvic mass or adenopathy. No free pelvic fluid collections. No inguinal mass or adenopathy. No abdominal wall hernia or subcutaneous lesions. Musculoskeletal: No significant bony findings. IMPRESSION: 1. No acute abdominal/pelvic findings, mass lesions or adenopathy. 2. Moderate-sized bilateral pleural effusions with overlying atelectasis. 3. Stable 4 cm splenic lesion, likely benign hemangioma. 4. Stable pneumobilia related to prior cholecystectomy and sphincterotomy. 5. Aortic atherosclerosis. Aortic Atherosclerosis (ICD10-I70.0). Electronically Signed   By: PMarijo SanesM.D.   On: 01/22/2022 14:16    Procedures .Critical Care  Performed by: NAudley Hose MD Authorized by: NAudley Hose MD   Critical care provider statement:    Critical care time (minutes):  45   Critical care was necessary to treat or prevent imminent or life-threatening deterioration of the following conditions:  Cardiac failure   Critical care was time spent personally by me  on the following activities:  Development of treatment plan with patient or surrogate, discussions with consultants, evaluation of patient's response to treatment, examination of patient, ordering and review of laboratory studies, ordering and review of radiographic studies, ordering and performing treatments and interventions, pulse oximetry, re-evaluation of patient's condition, review of old charts and obtaining history from patient or surrogate   Care discussed with: admitting provider       Medications Ordered in ED Medications  furosemide (LASIX) injection 40 mg (40 mg Intravenous Given 01/23/22 1840)  pantoprazole (PROTONIX) EC tablet 40 mg (has no administration in time range)  metoprolol succinate (TOPROL-XL) 24 hr tablet 25 mg (25 mg Oral Given 01/23/22 2122)    ED Course/ Medical Decision Making/ A&P                          Medical Decision Making Amount and/or Complexity of Data Reviewed Labs: ordered.  Decision-making details documented in ED Course. Radiology:  Decision-making details documented in ED Course.  Risk Prescription drug management. Decision regarding hospitalization.   Patient is overall HDS, no hypoxia, no significant tachypnea and is in no respiratory distress.   DDX for dyspnea includes but is not limited to: Cardiac- CHF, Myocardial Ischemia, Valvular heart disease, Arrhythmia Respiratory -consider pleural effusions as patient had these demonstrated on CT yesterday though he has good lung sounds bilaterally.  No wheezing, no concern for COPD/RAD.  Consider pneumonia though he has had no fever/chills and only mild cough.  Consider flu/COVID those were negative yesterday.  Considered PE though patient has no risk factors and no signs or symptoms of DVT on exam.  Other - Sepsis, Anemia    Labs demonstrate potassium 2.7, yesterday was 2.6.  Creatinine 1.61 from yesterday 1.26.  Troponin 978, lactate 4.6. 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  EKG does not demonstrate any signs of ischemia and hence concern for NSTEMI or type II MI.  Lactate 4.6 which I believe is due to hypovolemia/poor perfusion, also could be due to early cardiogenic shock though patient is normotensive.  Patient appears to be volume down with dry mucous membranes, AKI, and is complaining repetitively of thirst, which makes sense in the setting of his nausea vomiting diarrhea.  Given his pulmonary hypertension and pleural effusions will give a very small fluid bolus of 250 cc and evaluate response.  Patient is consulted to cardiology.  Pending BNP.  Patient is consulted to medicine for admission.  I have personally reviewed and interpreted all labs and imaging.  Patient was maintained on a cardiac monitor.  I have personally interpreted the telemetry as normal sinus rhythm.  Clinical Course as of  01/23/22 1625  Fri Jan 23, 2022  1624 DG Chest 2 View Hypoinflation with mild blunting of the right costophrenic angle likely small amount of pleural fluid versus pleuroparenchymal scarring.  [HN]  1625 Troponin I (High Sensitivity)(!!): 978 [HN]  1625 Potassium(!!): 2.7 [HN]    Clinical Course User Index [HN] Audley Hose, MD   DC: Admit to medicine with cardiology following        Final Clinical Impression(s) / ED Diagnoses Final diagnoses:  SOB (shortness of breath)  Troponin level elevated  AKI (acute kidney injury) ([place])  Hypokalemia    Rx / DC Orders ED Discharge Orders     None  This note was created using dictation software, which may contain spelling or grammatical errors.    Audley Hose, MD 01/24/22 [phone]

## 2022-01-23 NOTE — Progress Notes (Signed)
ANTICOAGULATION CONSULT NOTE - Initial Consult  Pharmacy Consult for heparin Indication: chest pain/ACS  Allergies  Allergen Reactions   Ace Inhibitors Other (See Comments) and Cough    OK with ramipril   Codeine Itching   Januvia [Sitagliptin] Itching   Tessalon [Benzonatate] Swelling    Throat swelling    Patient Measurements:   Heparin Dosing Weight: 56.2 (historical weight from 12/10/21)   Vital Signs: BP: 107/73 (11/24 1815) Pulse Rate: 77 (11/24 1815)  Labs: Recent Labs    01/22/22 1154 01/22/22 1247 01/23/22 1514 01/23/22 1653  HGB 12.4*  --  11.8*  --   HCT 35.9*  --  36.5*  --   PLT 272  --  236  --   LABPROT  --  14.0  --   --   INR  --  1.1  --   --   CREATININE 1.26*  --  1.61*  --   TROPONINIHS  --   --  978* 1,119*    CrCl cannot be calculated (Unknown ideal weight.).   Medical History: Past Medical History:  Diagnosis Date   Allergy    Arthritis    Ascending aortic aneurysm ([place]) 03/25/2016   03/23/16 CTA chest Mildly aneurysmal ascending aorta at 4.0 cm.  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    Hyperlipidemia    Hyperplastic colon polyp    Hypertension    Kidney stones    Moderate to severe mitral regurgitation 08/19/2021   Echocardiogram 06/2021: EF 45-50, mod to severe MR   Rheumatoid arthritis ([place])     Shingles outbreak 04/23/15   left shoulder and left arm   Vertigo     Assessment: 76 yo M with significant cardiac history (CABG 2005, HTN, HLD, HFrEF, mod-sev MR) presented with [facility]  and found to have elevated troponin 978 > 1119 . Possible plans for VQ scan and dopplers for VTE evaluation.   CBC: Hgb 11.8/Plt 236   Goal of Therapy:  Heparin level 0.3-0.7 units/ml Monitor platelets by anticoagulation protocol: Yes   Plan:  Heparin bolus 3300 u IV x1 followed by heparin 650u/hr  8hr HL at 0500 F/u s/sx bleeding, cardiac plans, VTE eval as appropriate Daily CBC, HL   Wilson Singer, PharmD Clinical Pharmacist 01/23/2022 6:19 PM

## 2022-01-23 NOTE — ED Notes (Signed)
Pt placed in hospital bed

## 2022-01-23 NOTE — ED Notes (Signed)
Lactic 4.6  EDPA Josh G notified

## 2022-01-23 NOTE — ED Triage Notes (Signed)
Pt here with increasing shob.  Told his wife he needed to come back to the hospital as "he couldn't make it tonight feeling like this"   "feels like he can't breathe well"

## 2022-01-24 ENCOUNTER — Inpatient Hospital Stay: Payer: Self-pay

## 2022-01-24 ENCOUNTER — Inpatient Hospital Stay (HOSPITAL_COMMUNITY): Payer: Medicare HMO

## 2022-01-24 ENCOUNTER — Encounter (HOSPITAL_COMMUNITY): Payer: Self-pay | Admitting: Internal Medicine

## 2022-01-24 DIAGNOSIS — I5031 Acute diastolic (congestive) heart failure: Secondary | ICD-10-CM | POA: Diagnosis not present

## 2022-01-24 DIAGNOSIS — Z515 Encounter for palliative care: Secondary | ICD-10-CM

## 2022-01-24 DIAGNOSIS — R7989 Other specified abnormal findings of blood chemistry: Secondary | ICD-10-CM

## 2022-01-24 DIAGNOSIS — Z7189 Other specified counseling: Secondary | ICD-10-CM

## 2022-01-24 DIAGNOSIS — Z789 Other specified health status: Secondary | ICD-10-CM

## 2022-01-24 DIAGNOSIS — N179 Acute kidney failure, unspecified: Secondary | ICD-10-CM

## 2022-01-24 DIAGNOSIS — R0602 Shortness of breath: Secondary | ICD-10-CM

## 2022-01-24 DIAGNOSIS — R570 Cardiogenic shock: Secondary | ICD-10-CM

## 2022-01-24 DIAGNOSIS — R638 Other symptoms and signs concerning food and fluid intake: Secondary | ICD-10-CM

## 2022-01-24 DIAGNOSIS — E876 Hypokalemia: Secondary | ICD-10-CM

## 2022-01-24 DIAGNOSIS — I5020 Unspecified systolic (congestive) heart failure: Secondary | ICD-10-CM | POA: Diagnosis not present

## 2022-01-24 DIAGNOSIS — Z66 Do not resuscitate: Secondary | ICD-10-CM

## 2022-01-24 LAB — ECHOCARDIOGRAM COMPLETE
Area-P 1/2: 4.19 cm2
Calc EF: 31.9 %
MV M vel: 4.62 m/s
MV Peak grad: 85.4 mmHg
MV VTI: 1.43 cm2
P 1/2 time: 400 msec
Radius: 0.8 cm
S' Lateral: 4.9 cm
Single Plane A2C EF: 28.2 %
Single Plane A4C EF: 37.6 %

## 2022-01-24 LAB — COMPREHENSIVE METABOLIC PANEL
ALT: 526 U/L — ABNORMAL HIGH (ref 0–44)
AST: 824 U/L — ABNORMAL HIGH (ref 15–41)
Albumin: 3.2 g/dL — ABNORMAL LOW (ref 3.5–5.0)
Alkaline Phosphatase: 100 U/L (ref 38–126)
Anion gap: 16 — ABNORMAL HIGH (ref 5–15)
BUN: 24 mg/dL — ABNORMAL HIGH (ref 8–23)
CO2: 23 mmol/L (ref 22–32)
Calcium: 8.9 mg/dL (ref 8.9–10.3)
Chloride: 100 mmol/L (ref 98–111)
Creatinine, Ser: 2.03 mg/dL — ABNORMAL HIGH (ref 0.61–1.24)
GFR, Estimated: 33 mL/min — ABNORMAL LOW (ref >60)
Glucose, Bld: 202 mg/dL — ABNORMAL HIGH (ref 70–99)
Potassium: 4.3 mmol/L (ref 3.5–5.1)
Sodium: 139 mmol/L (ref 135–145)
Total Bilirubin: 3 mg/dL — ABNORMAL HIGH (ref 0.3–1.2)
Total Protein: 5.7 g/dL — ABNORMAL LOW (ref 6.5–8.1)

## 2022-01-24 LAB — CBC
HCT: 33.5 % — ABNORMAL LOW (ref 39.0–52.0)
Hemoglobin: 11.1 g/dL — ABNORMAL LOW (ref 13.0–17.0)
MCH: 30.2 pg (ref 26.0–34.0)
MCHC: 33.1 g/dL (ref 30.0–36.0)
MCV: 91.3 fL (ref 80.0–100.0)
Platelets: 268 10*3/uL (ref 150–400)
RBC: 3.67 MIL/uL — ABNORMAL LOW (ref 4.22–5.81)
RDW: 14.7 % (ref 11.5–15.5)
WBC: 10.3 10*3/uL (ref 4.0–10.5)
nRBC: 0 % (ref 0.0–0.2)

## 2022-01-24 LAB — HEPARIN LEVEL (UNFRACTIONATED): Heparin Unfractionated: 0.14 IU/mL — ABNORMAL LOW (ref 0.30–0.70)

## 2022-01-24 LAB — LACTIC ACID, PLASMA: Lactic Acid, Venous: 4.4 mmol/L (ref 0.5–1.9)

## 2022-01-24 MED ORDER — HALOPERIDOL LACTATE 5 MG/ML IJ SOLN: 2.0000 mg | Freq: Four times a day (QID) | INTRAMUSCULAR | Status: DC | PRN

## 2022-01-24 MED ORDER — MORPHINE SULFATE (PF) 2 MG/ML IV SOLN
2.0000 mg | INTRAVENOUS | Status: DC | PRN
  Administered 2022-01-24 – 2022-01-26 (×9): 2 mg via INTRAVENOUS
  Filled 2022-01-24 (×9): qty 1

## 2022-01-24 MED ORDER — GLYCOPYRROLATE 0.2 MG/ML IJ SOLN: 0.2000 mg | INTRAMUSCULAR | Status: DC | PRN

## 2022-01-24 MED ORDER — LORAZEPAM 1 MG PO TABS: 1.0000 mg | ORAL_TABLET | ORAL | Status: DC | PRN

## 2022-01-24 MED ORDER — HALOPERIDOL LACTATE 2 MG/ML PO CONC
2.0000 mg | Freq: Four times a day (QID) | ORAL | Status: DC | PRN
  Filled 2022-01-24: qty 5

## 2022-01-24 MED ORDER — PROMETHAZINE HCL 25 MG/ML IJ SOLN
12.5000 mg | Freq: Four times a day (QID) | INTRAMUSCULAR | Status: DC | PRN
  Administered 2022-01-24: 12.5 mg via INTRAMUSCULAR
  Filled 2022-01-24: qty 1

## 2022-01-24 MED ORDER — POLYVINYL ALCOHOL 1.4 % OP SOLN
1.0000 [drp] | Freq: Four times a day (QID) | OPHTHALMIC | Status: DC | PRN
  Filled 2022-01-24: qty 15

## 2022-01-24 MED ORDER — DIPHENHYDRAMINE HCL 50 MG/ML IJ SOLN: 25.0000 mg | INTRAMUSCULAR | Status: DC | PRN

## 2022-01-24 MED ORDER — ONDANSETRON HCL 4 MG/2ML IJ SOLN: 4.0000 mg | Freq: Four times a day (QID) | INTRAMUSCULAR | Status: DC | PRN

## 2022-01-24 MED ORDER — LORAZEPAM 2 MG/ML IJ SOLN
1.0000 mg | INTRAMUSCULAR | Status: DC | PRN
  Administered 2022-01-26: 1 mg via INTRAVENOUS
  Filled 2022-01-24: qty 1

## 2022-01-24 MED ORDER — METOPROLOL SUCCINATE ER 25 MG PO TB24: 12.5000 mg | ORAL_TABLET | Freq: Every day | ORAL | Status: DC

## 2022-01-24 MED ORDER — LORAZEPAM 2 MG/ML PO CONC
1.0000 mg | ORAL | Status: DC | PRN
  Administered 2022-01-27: 1 mg via SUBLINGUAL
  Filled 2022-01-24: qty 1

## 2022-01-24 MED ORDER — ONDANSETRON 4 MG PO TBDP: 4.0000 mg | ORAL_TABLET | Freq: Four times a day (QID) | ORAL | Status: DC | PRN

## 2022-01-24 MED ORDER — GLYCOPYRROLATE 1 MG PO TABS
1.0000 mg | ORAL_TABLET | ORAL | Status: DC | PRN
  Filled 2022-01-24: qty 1

## 2022-01-24 MED ORDER — HALOPERIDOL 1 MG PO TABS
2.0000 mg | ORAL_TABLET | Freq: Four times a day (QID) | ORAL | Status: DC | PRN
  Filled 2022-01-24: qty 2

## 2022-01-24 MED ORDER — HEPARIN BOLUS VIA INFUSION
1500.0000 [IU] | Freq: Once | INTRAVENOUS | Status: AC
  Administered 2022-01-24: 1500 [IU] via INTRAVENOUS
  Filled 2022-01-24: qty 1500

## 2022-01-24 NOTE — Consult Note (Signed)
Consultation Note Date: 01/24/2022   Patient Name: Kevin Mcclain  DOB: 1946-01-17  MRN: [id number]  Age / Sex: 76 y.o., male  PCP: Kevin Olp, MD Referring Physician: Domenic Polite, MD  Reason for Consultation: Establishing goals of care  HPI/Patient Profile: 76 y.o. male  with past medical history of  chronically ill with CAD/CABG, chronic systolic CHF-EF 45 to 86%, moderate to severe MR, remote history of tonsillar CA status post radiation 2016, chronic pain on long-term narcotics, diabetes presented to ED on 01/24/2022 from home with complaints of increasing shortness of breath.  He was previously seen in ED on 01/22/2022 with GI issues including nausea/vomiting/diarrhea.  Patient was admitted on 01/23/2022 with dyspnea on exertion, elevated troponin, hypokalemia, bilateral pleural effusions, acute on chronic systolic CHF, moderate to severe MR, AKI, severe protein calorie malnutrition.   Cardiology and heart failure team were consulted and options discussed with family to include: 1) short-course of palliative inotropes with consideration of possible palliative mitraclip but may not be candidate 2) transition to comfort care.  Recommendation was given for full comfort care/hospice.    Clinical Assessment and Goals of Care: I have reviewed medical records including EPIC notes, labs, and imaging. Received report from primary RN - no acute concerns.  Went to visit patient at bedside -wife/[name] and his stepdaughter/Kevin Mcclain present. Patient was lying in bed asleep - I did not attempt to wake him to preserve comfort but he does briefly wake to voice.  Family informed me he had received dose of Phenergan which was making him sleepy.  No signs or non-verbal gestures of pain or discomfort noted. No respiratory distress, increased work of breathing, or secretions noted.   Met with family to discuss diagnosis,  prognosis, GOC, EOL wishes, disposition, and options.  I introduced Palliative Medicine as specialized medical care for people living with serious illness. It focuses on providing relief from the symptoms and stress of a serious illness. The goal is to improve quality of life for both the patient and the family.  We discussed a brief life review of the patient as well as functional and nutritional status.  Patient is currently married to Kevin Mcclain and has several children/stepchildren.  Prior to hospitalization, patient was living in a private residence with his wife.  He has been functionally independent, able to do yard work, driving; however, family endorsed patient has had a steady decline over the last 6 months.  Patient has had increasing symptom burden around dyspnea on exertion and fatigue.  Family report that patient's appetite has been "not good."  Albumin noted at 3.2 on 01/24/2022.  We discussed patient's current illness and what it means in the larger context of patient's on-going co-morbidities.  Family understand that CHF is a progressive, non-curable disease underlying the patient's current acute medical conditions.  Family have a clear understanding of his current acute medical situation.  They understand he is not a candidate for advanced heart failure therapies given his age and cachexia.  Therapeutic listening provided as they reflect over the information they were given this morning and discussions had with other providers and amongst themselves. Natural disease trajectory and expectations at EOL were discussed. I attempted to elicit values and goals of care important to the patient. The difference between aggressive medical intervention and comfort care was considered in light of the patient's goals of care.   Family tell me that they had a thorough discussion with patient earlier and it was decided to focus on his comfort and  quality of life over continuing aggressive medical interventions.  They tell me that they want to "make him comfortable" and "do not want aggressive treatment."  We talked about transition to comfort measures in house and what that would entail inclusive of medications to control pain, dyspnea, agitation, nausea, and itching. We discussed stopping all unnecessary measures such as blood draws, needle sticks, oxygen, antibiotics, CBGs/insulin, cardiac monitoring, IVF, and frequent vital signs. Education provided that other non-pharmacological interventions would be utilized for holistic support and comfort such as spiritual support if requested, repositioning, music therapy, offering comfort feeds, and/or therapeutic listening.  Family are agreeable for his transition to full comfort care in house today.    Provided education and counseling at length on the philosophy and benefits of hospice care. Discussed that it offers a holistic approach to care in the setting of end-sta ge illness, and is about supporting the patient where they are allowing nature to take it's course. Discussed the hospice team includes RNs, physicians, social workers, and chaplains. They can provide personal care, support for the family, and help keep patient out of the hospital as well as assist with DME needs for home hospice. Education provided on the difference between home vs residential hospice.  Prognostication reviewed.  Family are open to hospice at discharge; however, patient's wife states she feels very overwhelmed with the information/decisions they have been faced with today, which is understandable. She requests additional time to consider information and discuss with other family members/children that are arriving from out of town today.  Kevin Mcclain and Kevin Mcclain are open to scheduling a family meeting with PMT for tomorrow once other family have arrived from out of town.  They are open to continue discussions around hospice/discharge planning.  DNR/DNI confirmed.  Visit also consisted of  discussions dealing with the complex and emotionally intense issues of symptom management and palliative care in the setting of serious and potentially life-threatening illness.   Discussed with patient/family the importance of continued conversation with each other and the medical providers regarding overall plan of care and treatment options, ensuring decisions are within the context of the patient's values and GOCs.    Questions and concerns were addressed. The patient/family was encouraged to call with questions and/or concerns. PMT card was provided.   Primary Decision Maker: PATIENT with assistance from family    SUMMARY OF RECOMMENDATIONS   Initiated full comfort measures DNR/DNI confirmed - durable DNR form completed and placed at bedside in ED. Copy was made and will be scanned into Vyn[facility]/ACP tab Family are open to hospice; however, request additional time to consider information given to them today.  They also request family meeting with PMT tomorrow 11/26 to continue hospice/discharge discussions once other family have arrived from out of town - time TBD Added orders for EOL symptom management and to reflect full comfort measures, as well as discontinued orders that were not focused on comfort Unrestricted visitation orders were placed per current [place] EOL visitation policy  Nursing to provide frequent assessments and administer PRN medications as clinically necessary to ensure EOL comfort PMT will continue to follow and support holistically  Symptom Management Continue home regimen of fentanyl and oxycodone Continue home meds Zoloft, Synthroid, aspirin, nitroglycerin while patient tolerating p.o.'s Morphine PRN pain/dyspnea/increased work of breathing/RR>25 Tylenol PRN pain/fever Biotin twice daily Benadryl PRN itching Robinul PRN secretions Haldol PRN agitation/delirium Ativan PRN anxiety/seizure/sleep/distress Zofran PRN nausea/vomiting Liquifilm Tears PRN  dry eye   Code Status/Advance Care Planning: DNR  Palliative Prophylaxis:  Aspiration, Bowel Regimen, Delirium Protocol, Frequent Pain Assessment, Oral Care, and Turn Reposition  Additional Recommendations (Limitations, Scope, Preferences): Full Comfort Care  Psycho-social/Spiritual:  Desire for further Chaplaincy support:no Created space and opportunity for patient and family to express thoughts and feelings regarding patient's current medical situation.  Emotional support and therapeutic listening provided.  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 The following aspects are pertinent.  Past Medical History:  Diagnosis Date   Allergy    Arthritis    Ascending aortic aneurysm ([place]) 03/25/2016   03/23/16 CTA chest Mildly aneurysmal ascending aorta at 4.0 cm.  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    Hyperlipidemia    Hyperplastic colon polyp    Hypertension    Kidney stones    Moderate to severe mitral regurgitation 08/19/2021   Echocardiogram 06/2021: EF 45-50, mod to severe MR   Rheumatoid arthritis ([place])    Shingles outbreak 04/23/15   left shoulder and left arm   Vertigo    Social History   Socioeconomic History   Marital status: Married    Spouse name: Not on file   Number of children: Not on file   Years of education: Not on file   Highest education level: Not on file  Occupational History   Occupation: Retired   Tobacco Use   Smoking status: Never   Smokeless tobacco: Never  Vaping Use   Vaping Use: Never used  Substance and Sexual Activity   Alcohol use: No   Drug use: No   Sexual activity: Not on file  Other Topics Concern   Not on file  Social History Narrative   Married 35 years in October 2017.       Occupation :  Retired Arboriculturist for [employer].  Worked for [employer]: garden and work in yard   Social Determinants of [employer]: [place]  (07/18/2021)   Overall Financial Resource Strain (CARDIA)    Difficulty of Paying Living Expenses: Not hard at all  Food Insecurity: No Food Insecurity (07/18/2021)   Hunger Vital Sign    Worried About Running Out of Food in the Last Year: Never true    Ran Out of Food in the Last Year: Never true  Transportation Needs:  No Transportation Needs (07/18/2021)   PRAPARE - Hydrologist (Medical): No    Lack of Transportation (Non-Medical): No  Physical Activity: Inactive (07/18/2021)   Exercise Vital Sign    Days of Exercise per Week: 0 days    Minutes of Exercise per Session: 0 min  Stress: No Stress Concern Present (07/18/2021)   [place]    Feeling of Stress : Not at all  Social Connections: Moderately Isolated  (07/18/2021)   Social Connection and Isolation Panel [NHANES]    Frequency of Communication with Friends and Family: Never    Frequency of Social Gatherings with Friends and Family: Twice a week    Attends Religious Services: 1 to 4 times per year    Active Member of [employer] or Organizations: No    Attends Archivist Meetings: Never    Marital Status: Married   Family History  Problem Relation Age of Onset   Heart attack Mother        deceased age 23 secondary   Colon cancer Father    Stroke Father        deceased age 53   Heart disease Father        heart attack   Stroke Sister 32   Coronary artery disease Brother 58       s/p bypass surgery /stent   CAD Sister        potentially   Lymphoma Sister    Scheduled Meds:  [START ON 01/26/2022] aspirin EC  81 mg Oral Once per day on Mon Thu   atorvastatin  20 mg Oral Daily   [START ON 01/26/2022] fentaNYL  1 patch Transdermal Q72H   levothyroxine  100 mcg Oral Q0600   pantoprazole  40 mg Oral Q1200   sertraline  100 mg Oral QHS   Continuous Infusions:  heparin 750 Units/hr (01/24/22 1403)   PRN Meds:.acetaminophen **OR** acetaminophen, nitroGLYCERIN, ondansetron **OR** ondansetron (ZOFRAN) IV, oxyCODONE, promethazine (PHENERGAN) injection (IM or IVPB) Medications Prior to Admission:  Prior to Admission medications   Medication Sig Start Date End Date Taking? Authorizing Provider  aspirin EC 81 MG tablet Take 81 mg by mouth 2 (two) times a week.   Yes [provider]  atorvastatin (LIPITOR) 20 MG tablet Take 1 tablet (20 mg total) by mouth daily. Patient taking differently: Take 20 mg by mouth daily with breakfast. 07/01/21  Yes Freada Bergeron, MD  Cholecalciferol (VITAMIN D-3 PO) Take 1 capsule by mouth at bedtime.   Yes [provider]  fentaNYL (DURAGESIC) 50 MCG/HR Place 1 patch onto the skin every 3 (three) days. 02/13/22  Yes   levothyroxine (SYNTHROID) 100 MCG tablet Take 1 tablet (100 mcg  total) by mouth daily before breakfast. Patient taking differently: Take 100 mcg by mouth at bedtime. 07/07/21  Yes Kevin Olp, MD  metoprolol succinate (TOPROL-XL) 25 MG 24 hr tablet TAKE 1 TABLET (25 MG TOTAL) BY MOUTH DAILY. TAKE WITH OR IMMEDIATELY FOLLOWING A MEAL. Patient taking differently: Take 25 mg by mouth in the morning. 04/09/21  Yes Kevin Olp, MD  Naphazoline HCl (CLEAR EYES OP) Place 1 drop into both eyes 4 (four) times daily as needed (dry eyes).   Yes [provider]  nitroGLYCERIN (NITROSTAT) 0.4 MG SL tablet Place 1 tablet (0.4 mg total) under the tongue every 5 (five) minutes as needed. 12/10/21 01/08/24 Yes Kevin Olp, MD  ondansetron [facility]) 4  MG tablet Take 4 mg by mouth every 8 (eight) hours as needed for nausea or vomiting.   Yes [provider]  oxyCODONE (ROXICODONE) 5 MG/5ML solution Take 10-15 mLs (10-15 mg total) by mouth every 4 (four) hours as needed for breakthrough cancer pain 02/13/22  Yes   ramipril (ALTACE) 5 MG capsule TAKE 2 CAPSULES BY MOUTH EVERY MORNING Patient taking differently: Take 10 mg by mouth in the morning. 08/14/21  Yes Kevin Olp, MD  sertraline (ZOLOFT) 100 MG tablet Take 100 mg by mouth at bedtime. 11/20/21  Yes [provider]  spironolactone (ALDACTONE) 25 MG tablet Take 0.5 tablets (12.5 mg total) by mouth daily. Patient taking differently: Take 12.5 mg by mouth daily as needed (swelling, fluid). 09/17/21  Yes Freada Bergeron, MD  glucose blood (ONETOUCH VERIO) test strip USE 4 TIMES DAILY 01/09/22   Kevin Olp, MD  loperamide (IMODIUM) 2 MG capsule Take 1 capsule (2 mg total) by mouth 4 (four) times daily as needed for diarrhea or loose stools. Patient not taking: Reported on 01/23/2022 01/22/22   Cristie Hem, MD   Allergies  Allergen Reactions   Ace Inhibitors Other (See Comments) and Cough    OK with ramipril   Codeine Itching   Januvia [Sitagliptin] Itching    Tessalon [Benzonatate] Swelling    Throat swelling   Review of Systems  Unable to perform ROS: Other    Physical Exam Vitals and nursing note reviewed.  Constitutional:      General: He is not in acute distress.    Appearance: He is ill-appearing.  Pulmonary:     Effort: No respiratory distress.  Skin:    General: Skin is warm and dry.  Neurological:     Motor: Weakness present.     Comments: Somnolent      Vital Signs: BP 107/70   Pulse 74   Temp 98.2 F (36.8 C) (Rectal)   Resp 10   SpO2 100%  Pain Scale: 0-10   Pain Score: 2    SpO2: SpO2: 100 % O2 Device:SpO2: 100 % O2 Flow Rate: .O2 Flow Rate (L/min): 3 L/min  IO: Intake/output summary: No intake or output data in the 24 hours ending 01/24/22 1412  LBM:   Baseline Weight:   Most recent weight:       Palliative Assessment/Data: PPS 40%     Time In: 1400 Time Out: 1530 Time Total: 90 minutes  Greater than 50%  of this time was spent counseling and coordinating care related to the above assessment and plan.  Signed by: Lin Landsman, NP   Please contact Palliative Medicine Team phone at [phone] for questions and concerns.  For individual provider: See Amion  *Portions of this note are a verbal dictation therefore any spelling and/or grammatical errors are due to the "[place] One" system interpretation.

## 2022-01-24 NOTE — Progress Notes (Signed)
ANTICOAGULATION CONSULT NOTE  Pharmacy Consult for heparin Indication: chest pain/ACS, rule out PE  Allergies  Allergen Reactions   Ace Inhibitors Other (See Comments) and Cough    OK with ramipril   Codeine Itching   Januvia [Sitagliptin] Itching   Tessalon [Benzonatate] Swelling    Throat swelling    Patient Measurements:   Heparin Dosing Weight: 56.2 (historical weight from 12/10/21)   Vital Signs: Temp: 98.5 F (36.9 C) (11/25 0335) Temp Source: Oral (11/25 0335) BP: 106/59 (11/25 0430) Pulse Rate: 73 (11/25 0430)  Labs: Recent Labs    01/22/22 1154 01/22/22 1247 01/23/22 1514 01/23/22 1653 01/23/22 2026 01/24/22 0408  HGB 12.4*  --  11.8*  --   --  11.1*  HCT 35.9*  --  36.5*  --   --  33.5*  PLT 272  --  236  --   --  268  LABPROT  --  14.0  --   --   --   --   INR  --  1.1  --   --   --   --   HEPARINUNFRC  --   --   --   --   --  0.14*  CREATININE 1.26*  --  1.61*  --   --   --   TROPONINIHS  --   --  978* 1,119* 957*  --      CrCl cannot be calculated (Unknown ideal weight.).   Medical History: Past Medical History:  Diagnosis Date   Allergy    Arthritis    Ascending aortic aneurysm ([place]) 03/25/2016   03/23/16 CTA chest Mildly aneurysmal ascending aorta at 4.0 cm.  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     Hyperlipidemia    Hyperplastic colon polyp    Hypertension    Kidney stones    Moderate to severe mitral regurgitation 08/19/2021   Echocardiogram 06/2021: EF 45-50, mod to severe MR   Rheumatoid arthritis ([place])    Shingles outbreak 04/23/15   left shoulder and left arm   Vertigo     Assessment: 76 yo M with significant cardiac history (CABG 2005, HTN, HLD, HFrEF, mod-sev MR) presented with [facility]  and found to have elevated troponin 978 > 1119 . Possible plans for VQ scan and dopplers for VTE evaluation.   CBC: Hgb 11.8/Plt 236   11/25 AM update:  Heparin level low Cardiology also rec's checking VQ to rule out PE  Goal of Therapy:  Heparin level 0.3-0.7 units/ml Monitor platelets by anticoagulation protocol: Yes   Plan:  Heparin 1500 units bolus Inc heparin to 750 units/hr 1400 heparin level  Narda Bonds, PharmD, BCPS Clinical Pharmacist Phone: [phone]

## 2022-01-24 NOTE — Consult Note (Addendum)
Advanced Heart Failure Team Consult Note   Primary Physician: Marin Olp, MD  Primary Cardiologist: Freada Bergeron, MD  Reason for Shock: Cardiogenic shock   HPI:     Randall Colden is a 76 y.o. male with a hx of CABG 2005, LIMA-LAD & SVG-OM only patent graphs on cath 7/23 , HTN, DM2, h/o tonsillar cancer, DM2 and systolic HF with EF 45% by PET and 45% by echo with mod-severe MR. We are asked to see by Dr. Domenic Polite for shock.   Has been feeling poorly for several months with weakness and persistent abdominal symptoms. Struggles with ADLs.  Came to ED yesterday with progressive ab symptoms and SOB. Labs relatively normal. Sent home. Returned later in day with worsening symptoms.   Ecg with old anterior infarct without acute change. Lactate 3.5 > 4.4 Troponin 978>>1119 > 957 Creatinine 1.26>>1.61> 2.03 LFTs 824/526  Echo done today EF ~25% moderate RV dysfunction. Sever MR  Very weak. SOB with any activity.   Review of Systems: [y] = yes, '[ ]'$  = no   General: Weight gain '[ ]'$ ; Weight loss Blue.Reese ]; Anorexia Blue.Reese ]; Fatigue [ y]; Fever '[ ]'$ ; Chills '[ ]'$ ; Weakness Blue.Reese ]  Cardiac: Chest pain/pressure '[ ]'$ ; Resting SOB '[ ]'$ ; Exertional SOB [ y]; Orthopnea [ y]; Pedal Edema [ y]; Palpitations '[ ]'$ ; Syncope '[ ]'$ ; Presyncope '[ ]'$ ; Paroxysmal nocturnal dyspnea[ y]  Pulmonary: Cough Blue.Reese ]; Wheezing'[ ]'$ ; Hemoptysis'[ ]'$ ; Sputum '[ ]'$ ; Snoring '[ ]'$   GI: Vomiting'[ ]'$ ; Dysphagia'[ ]'$ ; Melena'[ ]'$ ; Hematochezia '[ ]'$ ; Heartburn[y ]; Abdominal pain Blue.Reese ]; Constipation '[ ]'$ ; Diarrhea '[ ]'$ ; BRBPR '[ ]'$   GU: Hematuria'[ ]'$ ; Dysuria '[ ]'$ ; Nocturia'[ ]'$   Vascular: Pain in legs with walking '[ ]'$ ; Pain in feet with lying flat '[ ]'$ ; Non-healing sores '[ ]'$ ; Stroke '[ ]'$ ; TIA '[ ]'$ ; Slurred speech '[ ]'$ ;  Neuro: Headaches'[ ]'$ ; Vertigo'[ ]'$ ; Seizures'[ ]'$ ; Paresthesias'[ ]'$ ;Blurred vision '[ ]'$ ; Diplopia '[ ]'$ ; Vision changes '[ ]'$   Ortho/Skin: Arthritis [ y]; Joint pain Blue.Reese ]; Muscle pain '[ ]'$ ; Joint swelling '[ ]'$ ; Back Pain '[ ]'$ ; Rash '[ ]'$   Psych:  Depression[y ]; Anxiety'[ ]'$   Heme: Bleeding problems '[ ]'$ ; Clotting disorders '[ ]'$ ; Anemia '[ ]'$   Endocrine: Diabetes [ y]; Thyroid dysfunction[y ]  Home Medications Prior to Admission medications   Medication Sig Start Date End Date Taking? Authorizing Provider  aspirin EC 81 MG tablet Take 81 mg by mouth 2 (two) times a week.   Yes [provider]  atorvastatin (LIPITOR) 20 MG tablet Take 1 tablet (20 mg total) by mouth daily. Patient taking differently: Take 20 mg by mouth daily with breakfast. 07/01/21  Yes Freada Bergeron, MD  Cholecalciferol (VITAMIN D-3 PO) Take 1 capsule by mouth at bedtime.   Yes [provider]  fentaNYL (DURAGESIC) 50 MCG/HR Place 1 patch onto the skin every 3 (three) days. 02/13/22  Yes   levothyroxine (SYNTHROID) 100 MCG tablet Take 1 tablet (100 mcg total) by mouth daily before breakfast. Patient taking differently: Take 100 mcg by mouth at bedtime. 07/07/21  Yes Marin Olp, MD  metoprolol succinate (TOPROL-XL) 25 MG 24 hr tablet TAKE 1 TABLET (25 MG TOTAL) BY MOUTH DAILY. TAKE WITH OR IMMEDIATELY FOLLOWING A MEAL. Patient taking differently: Take 25 mg by mouth in the morning. 04/09/21  Yes Marin Olp, MD  Naphazoline HCl (CLEAR EYES OP) Place 1 drop into both eyes 4 (four) times daily  as needed (dry eyes).   Yes [provider]  nitroGLYCERIN (NITROSTAT) 0.4 MG SL tablet Place 1 tablet (0.4 mg total) under the tongue every 5 (five) minutes as needed. 12/10/21 01/08/24 Yes Marin Olp, MD  ondansetron (ZOFRAN) 4 MG tablet Take 4 mg by mouth every 8 (eight) hours as needed for nausea or vomiting.   Yes [provider]  oxyCODONE (ROXICODONE) 5 MG/5ML solution Take 10-15 mLs (10-15 mg total) by mouth every 4 (four) hours as needed for breakthrough cancer pain 02/13/22  Yes   ramipril (ALTACE) 5 MG capsule TAKE 2 CAPSULES BY MOUTH EVERY MORNING Patient taking differently: Take 10 mg by mouth in the morning. 08/14/21   Yes Marin Olp, MD  sertraline (ZOLOFT) 100 MG tablet Take 100 mg by mouth at bedtime. 11/20/21  Yes [provider]  spironolactone (ALDACTONE) 25 MG tablet Take 0.5 tablets (12.5 mg total) by mouth daily. Patient taking differently: Take 12.5 mg by mouth daily as needed (swelling, fluid). 09/17/21  Yes Freada Bergeron, MD  glucose blood (ONETOUCH VERIO) test strip USE 4 TIMES DAILY 01/09/22   Marin Olp, MD  loperamide (IMODIUM) 2 MG capsule Take 1 capsule (2 mg total) by mouth 4 (four) times daily as needed for diarrhea or loose stools. Patient not taking: Reported on 01/23/2022 01/22/22   Cristie Hem, MD    Past Medical History: Past Medical History:  Diagnosis Date   Allergy    Arthritis    Ascending aortic aneurysm [facility]) 03/25/2016   03/23/16 CTA chest Mildly aneurysmal ascending aorta at 4.0 cm.  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    Hyperlipidemia    Hyperplastic colon polyp    Hypertension    Kidney stones    Moderate to severe mitral regurgitation 08/19/2021   Echocardiogram 06/2021: EF 45-50, mod to severe MR   Rheumatoid arthritis ([place])    Shingles outbreak 04/23/15   left shoulder and left arm   Vertigo     Past Surgical History: Past Surgical History:  Procedure Laterality Date   CHOLECYSTECTOMY  2012    COLONOSCOPY W/ POLYPECTOMY  2015   CORONARY ARTERY BYPASS GRAFT  2005   6 grafts   CYSTOSCOPY     kidney stone removal   IR GASTROSTOMY TUBE REMOVAL  06/25/2016   LITHOTRIPSY     MULTIPLE EXTRACTIONS WITH ALVEOLOPLASTY N/A 11/14/2014   Procedure: Extraction of tooth #2 and 31 with alveoloplasty after sectioning of bridge at distal #29 with full mouth debridement of remaining dention;  Surgeon: Lenn Cal, DDS;  Location: [facility];  Service: Oral Surgery;  Laterality: N/A;   NASAL SINUS SURGERY Right 11/14/2014   Procedure: RIGHT ENDOSCOPIC ANTROSTOMY AND ANTERIOR ETHMOIDECTOMY ;  Surgeon: Jodi Marble, MD;  Location: [facility];  Service: ENT;  Laterality: Right;   RIGHT/LEFT HEART CATH AND CORONARY/GRAFT ANGIOGRAPHY N/A 09/01/2021   Procedure: RIGHT/LEFT HEART CATH AND CORONARY/GRAFT ANGIOGRAPHY;  Surgeon: Sherren Mocha, MD;  Location: [facility];  Service: Cardiovascular;  Laterality: N/A;    Family History: Family History  Problem Relation Age of Onset   Heart attack Mother        deceased age 36 secondary   Colon cancer Father    Stroke Father        deceased age 35   Heart disease Father        heart attack   Stroke Sister 64   Coronary artery disease Brother 65       s/p bypass surgery /stent   CAD Sister        potentially   Lymphoma Sister     Social History: Social History   Socioeconomic History   Marital status: Married    Spouse name: Not on file   Number of children: Not on file   Years of education: Not on file   Highest education level: Not on file  Occupational History   Occupation: Retired   Tobacco Use   Smoking status: Never   Smokeless tobacco: Never  Vaping Use   Vaping Use: Never used  Substance and Sexual Activity   Alcohol use: No   Drug use: No   Sexual activity: Not on file  Other Topics Concern   Not on file  Social History Narrative   Married 35 years in October 2017.       Occupation :  Retired Arboriculturist for [employer].  Worked for [employer]: garden and work in [place] Strain: [place]  (07/18/2021)   Overall Financial Resource Strain (CARDIA)    Difficulty of Paying Living Expenses: Not hard at all  Food Insecurity: No Food Insecurity (07/18/2021)   Hunger Vital Sign    Worried About Running Out of Food in the Last Year: Never true    [place] in the Last Year: Never true  Transportation Needs: No Transportation Needs (07/18/2021)   PRAPARE - Hydrologist (Medical): No    Lack of Transportation (Non-Medical): No  Physical Activity: Inactive (07/18/2021)   Exercise Vital Sign    Days of Exercise per Week: 0 days    Minutes of Exercise per Session: 0 min  Stress: No Stress Concern Present (07/18/2021)   [place]    Feeling of Stress : Not at all  Social Connections: Moderately Isolated (07/18/2021)   Social Connection and Isolation Panel [NHANES]    Frequency of Communication with Friends and Family: Never    Frequency of Social Gatherings with Friends and Family: Twice a week    Attends Religious Services: 1 to 4 times per year    Active Member of [employer] or Organizations: No    Attends Archivist Meetings: Never    Marital Status: Married    Allergies:  Allergies  Allergen Reactions   Ace Inhibitors Other (See Comments) and Cough    OK with ramipril   Codeine Itching   Januvia [Sitagliptin] Itching   Tessalon [Benzonatate] Swelling    Throat swelling    Objective:    Vital Signs:   Temp:  [97.5 F (36.4 C)-98.5 F (36.9 C)] 98.2 F (36.8 C) (  11/25 1053) Pulse Rate:  [61-81] 74 (11/25 1300) Resp:  [8-30] 10 (11/25 1200) BP: (97-121)/(33-73) 107/70 (11/25 1300) SpO2:  [97 %-100 %] 100 % (11/25 1300)    Physical Exam: General:  Cachetic weak appearing. No resp difficulty HEENT: normal Neck:  supple. JVP to jaw  . Carotids 2+ bilat; no bruits. No lymphadenopathy or thryomegaly appreciated. Cor: PMI nondisplaced. Regular rate & rhythm. 3/6 MR Lungs: decreased at bases L >R Abdomen: soft, nontender, nondistended. No hepatosplenomegaly. No bruits or masses. Good bowel sounds. Extremities: no cyanosis, clubbing, rash, edema + cool somewhat mottled Neuro: alert & orientedx3, cranial nerves grossly intact. moves all 4 extremities w/o difficulty. Affect pleasant  Telemetry: Sinus 70s Personally reviewed  Labs: Basic Metabolic Panel: Recent Labs  Lab 01/22/22 1154 01/23/22 1514 01/23/22 1653 01/24/22 0408  NA 139 139  --  139  K 2.6* 2.7*  --  4.3  CL 97* 99  --  100  CO2 26 23  --  23  GLUCOSE 189* 214*  --  202*  BUN 12 16  --  24*  CREATININE 1.26* 1.61*  --  2.03*  CALCIUM 9.1 9.0  --  8.9  MG  --   --  2.1  --     Liver Function Tests: Recent Labs  Lab 01/22/22 1154 01/23/22 1514 01/24/22 0408  AST 34 43* 824*  ALT 31 33 526*  ALKPHOS 53 52 100  BILITOT 2.7* 2.1* 3.0*  PROT 6.3* 6.3* 5.7*  ALBUMIN 3.5 3.6 3.2*   Recent Labs  Lab 01/22/22 1154  LIPASE 28   Recent Labs  Lab 01/22/22 1247  AMMONIA 22    CBC: Recent Labs  Lab 01/22/22 1154 01/23/22 1514 01/24/22 0408  WBC 10.9* 9.1 10.3  HGB 12.4* 11.8* 11.1*  HCT 35.9* 36.5* 33.5*  MCV 88.4 93.6 91.3  PLT 272 236 268    Cardiac Enzymes: No results for input(s): "CKTOTAL", "CKMB", "CKMBINDEX", "TROPONINI" in the last 168 hours.  BNP: BNP (last 3 results) Recent Labs    01/23/22 1653  BNP 2,942.0*    ProBNP (last 3 results) No results for input(s): "PROBNP" in the last 8760 hours.   CBG: No results for input(s): "GLUCAP" in the last 168 hours.  Coagulation Studies: Recent Labs    01/22/22 1247  LABPROT 14.0  INR 1.1    Other results: EKG: as per HPI  Imaging: ECHOCARDIOGRAM COMPLETE  Result Date: 01/24/2022    ECHOCARDIOGRAM REPORT   Patient Name:   [facility] Date of Exam: 01/24/2022 Medical Rec #:  [id number]         Height:       65.0 in Accession #:    [id number]        Weight:       124.0 lb Date of Birth:  03-07-1945          BSA:          1.614 m Patient Age:    9 years          BP:           115/67 mmHg Patient Gender: M                 HR:           76 bpm. Exam Location:  Inpatient Procedure: 2D Echo, Cardiac Doppler and Color Doppler STAT ECHO Indications:    CHF-Acute Diastolic Z12.45  History:        Patient has  prior history of Echocardiogram examinations, most                 recent 07/04/2021. CHF, CAD; Risk Factors:Hypertension, Diabetes                 and Dyslipidemia. Elevated troponin, HFmrEF (heart failure with                 mldly reduced ejection fraction) ([place]), Moderate to severe                 mitral regurgitation.  Sonographer:    Alvino Chapel RCS Referring Phys: [place]  1. Left ventricular ejection fraction, by estimation, is 25 to 30%. The left ventricle has severely decreased function. The left ventricle demonstrates regional wall motion abnormalities (see scoring diagram/findings for description). The left ventricular internal cavity size was mildly dilated. There is mild left ventricular hypertrophy. Left ventricular diastolic parameters are consistent with Grade II diastolic dysfunction (pseudonormalization).  2. Right ventricular systolic function is moderately reduced. The right ventricular size is normal. There is mildly elevated pulmonary artery systolic pressure. The estimated right ventricular systolic pressure is 25.4 mmHg.  3. Left atrial size was severely dilated.  4. Right atrial size was mildly dilated.  5. Moderate pleural effusion in the left lateral region.  6. PISA ERO 0.35 cm2 and regugitant volume 54 ml. The mitral valve is abnormal, thickened and with restricted posterio leaflet motion and incomplete coaptation. Moderate to severe mitral valve regurgitation, upper end of scale,  Carpentier class IIIb.  7. The aortic valve is tricuspid. Aortic valve regurgitation is mild to moderate. Aortic valve sclerosis is present, with no evidence of aortic valve stenosis.  8. The inferior vena cava is normal in size with <50% respiratory variability, suggesting right atrial pressure of 8 mmHg. Comparison(s): Prior images reviewed side by side. LVEF has decreased in comparison. Mitral regurgitation borderline severe by PISA. FINDINGS  Left Ventricle: Left ventricular ejection fraction, by estimation, is 25 to 30%. The left ventricle has severely decreased function. The left ventricle demonstrates regional wall motion abnormalities. The left ventricular internal cavity size was mildly  dilated. There is mild left ventricular hypertrophy. Left ventricular diastolic parameters are consistent with Grade II diastolic dysfunction (pseudonormalization).  LV Wall Scoring: The basal inferior segment is akinetic. The entire anterior wall, entire lateral wall, entire septum, entire apex, and mid and distal inferior wall are hypokinetic. Right Ventricle: The right ventricular size is normal. No increase in right ventricular wall thickness. Right ventricular systolic function is moderately reduced. There is mildly elevated pulmonary artery systolic pressure. The tricuspid regurgitant velocity is 2.80 m/s, and with an assumed right atrial pressure of 8 mmHg, the estimated right ventricular systolic pressure is 27.0 mmHg. Left Atrium: Left atrial size was severely dilated. Right Atrium: Right atrial size was mildly dilated. Pericardium: There is no evidence of pericardial effusion. Mitral Valve: PISA ERO 0.35 cm2 and regugitant volume 54 ml. The mitral valve is abnormal. There is mild thickening of the mitral valve leaflet(s). Moderate to severe mitral valve regurgitation, with posteriorly-directed jet. MV peak gradient, 6.2 mmHg. The mean mitral valve gradient is 2.0 mmHg. Tricuspid Valve: The tricuspid valve is  grossly normal. Tricuspid valve regurgitation is mild. Aortic Valve: The aortic valve is tricuspid. There is mild aortic valve annular calcification. Aortic valve regurgitation is mild to moderate. Aortic regurgitation PHT measures 400 msec. Aortic valve sclerosis is present, with no evidence of aortic valve  stenosis. Pulmonic Valve:  The pulmonic valve was grossly normal. Pulmonic valve regurgitation is trivial. Aorta: The aortic root is normal in size and structure. Venous: The inferior vena cava is normal in size with less than 50% respiratory variability, suggesting right atrial pressure of 8 mmHg. IAS/Shunts: No atrial level shunt detected by color flow Doppler. Additional Comments: There is a moderate pleural effusion in the left lateral region.  LEFT VENTRICLE PLAX 2D LVIDd:         5.60 cm      Diastology LVIDs:         4.90 cm      LV e' medial:    2.89 cm/s LV PW:         1.00 cm      LV E/e' medial:  39.1 LV IVS:        1.00 cm      LV e' lateral:   4.81 cm/s LVOT diam:     2.00 cm      LV E/e' lateral: 23.5 LV SV:         41 LV SV Index:   25 LVOT Area:     3.14 cm  LV Volumes (MOD) LV vol d, MOD A2C: 149.0 ml LV vol d, MOD A4C: 140.5 ml LV vol s, MOD A2C: 107.0 ml LV vol s, MOD A4C: 87.7 ml LV SV MOD A2C:     42.0 ml LV SV MOD A4C:     140.5 ml LV SV MOD BP:      46.2 ml RIGHT VENTRICLE RV S prime:     7.15 cm/s TAPSE (M-mode): 1.4 cm LEFT ATRIUM              Index        RIGHT ATRIUM           Index LA diam:        4.20 cm  2.60 cm/m   RA Area:     20.20 cm LA Vol (A2C):   113.0 ml 69.99 ml/m  RA Volume:   58.90 ml  36.48 ml/m LA Vol (A4C):   70.5 ml  43.67 ml/m LA Biplane Vol: 90.5 ml  56.06 ml/m  AORTIC VALVE LVOT Vmax:   69.20 cm/s LVOT Vmean:  43.200 cm/s LVOT VTI:    0.131 m AI PHT:      400 msec  AORTA Ao Root diam: 3.40 cm MITRAL VALVE                  TRICUSPID VALVE MV Area (PHT): 4.19 cm       TR Peak grad:   31.4 mmHg MV Area VTI:   1.43 cm       TR Vmax:        280.00 cm/s MV Peak  grad:  6.2 mmHg MV Mean grad:  2.0 mmHg       SHUNTS MV Vmax:       1.24 m/s       Systemic VTI:  0.13 m MV Vmean:      59.1 cm/s      Systemic Diam: 2.00 cm MV Decel Time: 181 msec MR Peak grad:    85.4 mmHg MR Mean grad:    47.0 mmHg MR Vmax:         462.00 cm/s MR Vmean:        308.0 cm/s MR PISA:         4.02 cm MR PISA Eff ROA: 28 mm MR PISA Radius:  0.80  cm MV E velocity: 113.00 cm/s MV A velocity: 65.60 cm/s MV E/A ratio:  1.72 Rozann Lesches MD Electronically signed by Rozann Lesches MD Signature Date/Time: 01/24/2022/1:23:10 PM    Final    [place] EKG SITE RITE  Result Date: 01/24/2022 If Site Rite image not attached, placement could not be confirmed due to current cardiac rhythm.  DG Chest 2 View  Result Date: 01/23/2022 CLINICAL DATA:  Shortness of breath and upper abdominal pain. EXAM: CHEST - 2 VIEW COMPARISON:  09/01/2010 FINDINGS: Sternotomy wires intact. Lungs are hypoinflated with mild blunting of the right costophrenic angle likely small amount of pleural fluid versus pleuroparenchymal scarring. No airspace consolidation. Cardiomediastinal silhouette and remainder of the exam is unremarkable. IMPRESSION: Hypoinflation with mild blunting of the right costophrenic angle likely small amount of pleural fluid versus pleuroparenchymal scarring. Electronically Signed   By: Marin Olp M.D.   On: 01/23/2022 15:47   CT Abdomen Pelvis W Contrast  Result Date: 01/22/2022 CLINICAL DATA:  Acute abdominal pain. EXAM: CT ABDOMEN AND PELVIS WITH CONTRAST TECHNIQUE: Multidetector CT imaging of the abdomen and pelvis was performed using the standard protocol following bolus administration of intravenous contrast. RADIATION DOSE REDUCTION: This exam was performed according to the departmental dose-optimization program which includes automated exposure control, adjustment of the mA and/or kV according to patient size and/or use of iterative reconstruction technique. CONTRAST:  84m OMNIPAQUE IOHEXOL  350 MG/ML SOLN COMPARISON:  CT scan 03/23/2016 FINDINGS: Lower chest: Moderate-sized bilateral pleural effusions with overlying atelectasis. The heart is within normal limits in size. No pericardial effusion. Aortic and coronary artery calcifications are noted. Hepatobiliary: Stable pneumobilia related to prior cholecystectomy and sphincterotomy. No hepatic lesions or intrahepatic biliary dilatation. No common bile duct dilatation. Pancreas: No mass, inflammation or ductal dilatation. Spleen: Stable 4 cm splenic lesions, likely a benign hemangioma. No change since prior studies. Adrenals/Urinary Tract: The adrenal glands are normal. Stable bilateral simple renal cysts. No worrisome renal lesions. No follow-up imaging is necessary. The bladder is unremarkable. Stomach/Bowel: The stomach, duodenum, small bowel and colon are unremarkable. No acute inflammatory process, mass lesions or obstructive findings. The terminal ileum and appendix are normal. Scattered descending and sigmoid colon diverticulosis Vascular/Lymphatic: Stable atherosclerotic calcifications involving the aorta and branch vessels but no aneurysm or dissection. The major venous structures are patent. No mesenteric or retroperitoneal mass or adenopathy. Reproductive: The prostate gland and seminal vesicles are unremarkable. Other: No pelvic mass or adenopathy. No free pelvic fluid collections. No inguinal mass or adenopathy. No abdominal wall hernia or subcutaneous lesions. Musculoskeletal: No significant bony findings. IMPRESSION: 1. No acute abdominal/pelvic findings, mass lesions or adenopathy. 2. Moderate-sized bilateral pleural effusions with overlying atelectasis. 3. Stable 4 cm splenic lesion, likely benign hemangioma. 4. Stable pneumobilia related to prior cholecystectomy and sphincterotomy. 5. Aortic atherosclerosis. Aortic Atherosclerosis (ICD10-I70.0). Electronically Signed   By: PMarijo SanesM.D.   On: 01/22/2022 14:16        Assessment/Plan:   1. Acute on chronic systolic HF due to iCM -> cardiogenic shock - Previous EF 30% by PET and 45% by echo - Echo 01/24/22  EF 25% RV moderately down severe MR - cath 7/23 stable coronary anatomy - He is currently in cardiogenic shock with elevated lactic acid and MSOF - He is not a candidate for advanced therapies with age and cachexia - Discussed options with patient and family - 1) short-course of palliative inotropes with consideration of possible palliative mitraclip but may not be candidate 2) transition to  comfort care - they will meet further and decide. I think the latter is most appropriate given recent course - He is now DNR  2. CAD with elevated trop - likely demand ischemia. No evidence ACS - not further w/u  3. Severe mitral regurgitation - secondary to HF and LAE - plan as above  4. AKI  - due to ATN/shock  5. Shock liver  6. DNR/DNI  Favor transition to [facility]. Family discussing. Will place Palliative Consult.   CRITICAL CARE Performed by: Glori Bickers  Total critical care time: 45 minutes  Critical care time was exclusive of separately billable procedures and treating other patients.  Critical care was necessary to treat or prevent imminent or life-threatening deterioration.  Critical care was time spent personally by me (independent of midlevel providers or residents) on the following activities: development of treatment plan with patient and/or surrogate as well as nursing, discussions with consultants, evaluation of patient's response to treatment, examination of patient, obtaining history from patient or surrogate, ordering and performing treatments and interventions, ordering and review of laboratory studies, ordering and review of radiographic studies, pulse oximetry and re-evaluation of patient's condition.   Length of Stay: 1   Glori Bickers MD 01/24/2022, 1:44 PM  Advanced Heart Failure Team Pager  [phone] (M-F; [place])  Please contact [facility] for night-coverage after hours (4p -7a ) and weekends on [email or web address]

## 2022-01-24 NOTE — ED Notes (Signed)
Palliative care at bedside.

## 2022-01-24 NOTE — Progress Notes (Signed)
*  PRELIMINARY RESULTS* Echocardiogram 2D Echocardiogram has been performed.  Samuel Germany 01/24/2022, 12:51 PM

## 2022-01-24 NOTE — ED Notes (Signed)
Pt resting comfortably, family at bedside. Appears in NAD and does not appear to be in pain, agitated, or restless. Respirations even and unlabored.

## 2022-01-24 NOTE — Progress Notes (Signed)
Progress Note  Patient Name: Kevin Mcclain Date of Encounter: 01/24/2022  Primary Cardiologist: Freada Bergeron, MD  Subjective   Resting after Phenergan.  Per discussion with family present, he has had a fairly steady decline at least over a month in terms of stamina, dyspnea on exertion, and fatigue.  No substantial fluid retention.  Also intermittent abdominal symptoms and fluctuation in appetite.  Inpatient Medications    Scheduled Meds:  [START ON 01/26/2022] aspirin EC  81 mg Oral Once per day on Mon Thu   atorvastatin  20 mg Oral Daily   [START ON 01/26/2022] fentaNYL  1 patch Transdermal Q72H   levothyroxine  100 mcg Oral Q0600   pantoprazole  40 mg Oral Q1200   sertraline  100 mg Oral QHS   Continuous Infusions:  heparin 750 Units/hr (01/24/22 0522)   PRN Meds: acetaminophen **OR** acetaminophen, nitroGLYCERIN, ondansetron **OR** ondansetron (ZOFRAN) IV, oxyCODONE, promethazine (PHENERGAN) injection (IM or IVPB)   Vital Signs    Vitals:   01/24/22 0700 01/24/22 0900 01/24/22 0930 01/24/22 1053  BP: (!) 110/52 (!) 105/48 110/64   Pulse: 73 61 62   Resp: (!) 24 (!) 23 (!) 8   Temp:    98.2 F (36.8 C)  TempSrc:    Rectal  SpO2: 100% 100% 100%    No intake or output data in the 24 hours ending 01/24/22 1127 There were no vitals filed for this visit.  Telemetry    Sinus rhythm.  Personally reviewed.  ECG    An ECG dated 01/23/2022 was personally reviewed today and demonstrated:  Sinus rhythm with IVCD and increased voltage, anteroseptal and high lateral Q waves.  Physical Exam   GEN: Chronically ill-appearing, no acute distress.   Neck: No JVD. Cardiac: RRR, 6-2/9 apical systolic murmur, rub, or gallop.  Respiratory: Nonlabored.  Decreased breath sounds at bases. GI: Soft, nontender, bowel sounds present. MS: No edema; No deformity. Neuro:  Nonfocal.  Labs    Chemistry Recent Labs  Lab 01/22/22 1154 01/23/22 1514 01/24/22 0408  NA  139 139 139  K 2.6* 2.7* 4.3  CL 97* 99 100  CO2 '26 23 23  '$ GLUCOSE 189* 214* 202*  BUN 12 16 24*  CREATININE 1.26* 1.61* 2.03*  CALCIUM 9.1 9.0 8.9  PROT 6.3* 6.3* 5.7*  ALBUMIN 3.5 3.6 3.2*  AST 34 43* 824*  ALT 31 33 526*  ALKPHOS 53 52 100  BILITOT 2.7* 2.1* 3.0*  GFRNONAA 58* 44* 33*  ANIONGAP 16* 17* 16*     Hematology Recent Labs  Lab 01/22/22 1154 01/23/22 1514 01/24/22 0408  WBC 10.9* 9.1 10.3  RBC 4.06* 3.90* 3.67*  HGB 12.4* 11.8* 11.1*  HCT 35.9* 36.5* 33.5*  MCV 88.4 93.6 91.3  MCH 30.5 30.3 30.2  MCHC 34.5 32.3 33.1  RDW 14.3 14.7 14.7  PLT 272 236 268    Cardiac Enzymes Recent Labs  Lab 01/23/22 1514 01/23/22 1653 01/23/22 2026  TROPONINIHS 978* 1,119* 957*    BNP Recent Labs  Lab 01/23/22 1653  BNP 2,942.0*     Radiology    [place] EKG SITE RITE  Result Date: 01/24/2022 If Site Rite image not attached, placement could not be confirmed due to current cardiac rhythm.  DG Chest 2 View  Result Date: 01/23/2022 CLINICAL DATA:  Shortness of breath and upper abdominal pain. EXAM: CHEST - 2 VIEW COMPARISON:  09/01/2010 FINDINGS: Sternotomy wires intact. Lungs are hypoinflated with mild blunting of the right costophrenic angle likely  small amount of pleural fluid versus pleuroparenchymal scarring. No airspace consolidation. Cardiomediastinal silhouette and remainder of the exam is unremarkable. IMPRESSION: Hypoinflation with mild blunting of the right costophrenic angle likely small amount of pleural fluid versus pleuroparenchymal scarring. Electronically Signed   By: Marin Olp M.D.   On: 01/23/2022 15:47   CT Abdomen Pelvis W Contrast  Result Date: 01/22/2022 CLINICAL DATA:  Acute abdominal pain. EXAM: CT ABDOMEN AND PELVIS WITH CONTRAST TECHNIQUE: Multidetector CT imaging of the abdomen and pelvis was performed using the standard protocol following bolus administration of intravenous contrast. RADIATION DOSE REDUCTION: This exam was performed  according to the departmental dose-optimization program which includes automated exposure control, adjustment of the mA and/or kV according to patient size and/or use of iterative reconstruction technique. CONTRAST:  41m OMNIPAQUE IOHEXOL 350 MG/ML SOLN COMPARISON:  CT scan 03/23/2016 FINDINGS: Lower chest: Moderate-sized bilateral pleural effusions with overlying atelectasis. The heart is within normal limits in size. No pericardial effusion. Aortic and coronary artery calcifications are noted. Hepatobiliary: Stable pneumobilia related to prior cholecystectomy and sphincterotomy. No hepatic lesions or intrahepatic biliary dilatation. No common bile duct dilatation. Pancreas: No mass, inflammation or ductal dilatation. Spleen: Stable 4 cm splenic lesions, likely a benign hemangioma. No change since prior studies. Adrenals/Urinary Tract: The adrenal glands are normal. Stable bilateral simple renal cysts. No worrisome renal lesions. No follow-up imaging is necessary. The bladder is unremarkable. Stomach/Bowel: The stomach, duodenum, small bowel and colon are unremarkable. No acute inflammatory process, mass lesions or obstructive findings. The terminal ileum and appendix are normal. Scattered descending and sigmoid colon diverticulosis Vascular/Lymphatic: Stable atherosclerotic calcifications involving the aorta and branch vessels but no aneurysm or dissection. The major venous structures are patent. No mesenteric or retroperitoneal mass or adenopathy. Reproductive: The prostate gland and seminal vesicles are unremarkable. Other: No pelvic mass or adenopathy. No free pelvic fluid collections. No inguinal mass or adenopathy. No abdominal wall hernia or subcutaneous lesions. Musculoskeletal: No significant bony findings. IMPRESSION: 1. No acute abdominal/pelvic findings, mass lesions or adenopathy. 2. Moderate-sized bilateral pleural effusions with overlying atelectasis. 3. Stable 4 cm splenic lesion, likely benign  hemangioma. 4. Stable pneumobilia related to prior cholecystectomy and sphincterotomy. 5. Aortic atherosclerosis. Aortic Atherosclerosis (ICD10-I70.0). Electronically Signed   By: PMarijo SanesM.D.   On: 01/22/2022 14:16    Cardiac Studies   Echocardiogram 07/04/2021:  1. Left ventricular ejection fraction, by estimation, is 45 to 50%. Left  ventricular ejection fraction by 3D volume is 50 %. The left ventricle has  mildly decreased function. The left ventricle demonstrates regional wall  motion abnormalities (see  scoring diagram/findings for description). Left ventricular diastolic  parameters are consistent with Grade II diastolic dysfunction  (pseudonormalization). Elevated left ventricular end-diastolic pressure.  The E/e' is 191 There is severe akinesis of the  left ventricular, basal-mid inferior wall.   2. Right ventricular systolic function is normal. The right ventricular  size is normal. There is moderately elevated pulmonary artery systolic  pressure. The estimated right ventricular systolic pressure is 578.6mmHg.   3. The posterior leaflet appears ischemically tethered. The mitral valve  is abnormal. Moderate to severe mitral valve regurgitation.   4. The aortic valve is tricuspid. Aortic valve regurgitation is mild to  moderate. No aortic stenosis is present. Aortic regurgitation PHT measures  381 msec.   5. The inferior vena cava is normal in size with greater than 50%  respiratory variability, suggesting right atrial pressure of 3 mmHg.   Cardiac catheterization 09/01/2021:  Mid RCA to Dist RCA lesion is 100% stenosed.   Ost LAD to Prox LAD lesion is 100% stenosed.   Ost Cx to Prox Cx lesion is 100% stenosed.   Origin lesion before 1st RPL  is 100% stenosed.   Origin to Prox Graft lesion is 100% stenosed.   LIMA.   Seq SVG- PDA and PLA.   Severe native three-vessel coronary artery disease with total occlusion of the LAD, left circumflex, and RCA Status post  aortocoronary bypass surgery with continued patency of the LIMA to LAD and bypass graft to OM, both vessels supplying collaterals to the PDA, PLA, and diagonal branch of the LAD Moderate pulmonary hypertension with mean PA pressure 33 mmHg, mean wedge pressure 23 mmHg, V wave 29 mmHg.  Preserved cardiac output.  Hemodynamic findings could be consistent with hemodynamically significant mitral regurgitation.  Assessment & Plan    1.  Suspected HFrEF with low output heart failure.  Patient has a history of ischemic cardiomyopathy, LVEF 45 to 50% with moderate diastolic dysfunction as of May, also moderate pulmonary hypertension at that point with moderate to severe mitral regurgitation.  Coronary angiography in July revealed patent bypass grafts with preserved cardiac output.  He has not had a recent echocardiogram and now presents with worsening stamina, increasing dyspnea on exertion, intermittent abdominal symptoms and fluctuating appetite.  Seen in consultation by Dr. Martin[place] yesterday, note reviewed. Received Lasix in the ER.  Today's lab work more consistent with low output including worsening renal function, transaminitis, and elevated lactate.  Cardiac enzymes are not clearly suggestive of ACS, more likely demand ischemia in the setting of heart failure.  2.  Acute on chronic kidney injury, creatinine 2.03 up from 1.26 on November 23.  3.  Significant transaminitis in the span of 24 hours, possibly in association with low cardiac output exacerbated by diuresis.  CT abdomen and pelvis from November 23 showed no acute abdominal or pelvic findings, no masses or adenopathy.  Moderate bilateral pleural effusions were noted as well as evidence of prior cholecystectomy and sphincterotomy.  Total bilirubin is 3.  4.  Elevated high-sensitivity troponin I level with peak of 1119, at this point suspect demand ischemia in the setting of heart failure rather than ACS.  Currently on IV heparin (was initially  started pending further work-up, no D-dimer checked but doubt pulmonary embolus as etiology of presentation).  5.  Multivessel CAD status post CABG with patent bypass grafts as of cardiac catheterization in July of this year.  6.  Moderate to severe mitral regurgitation with moderately elevated pulmonary pressures by echocardiogram in May.  At this point would recommend admission to the ICU, place PICC and obtain co.-oximetry.  Suspect that he will need inotropes.  Would cut Toprol-XL in half and probably discontinue in the next 24 hours, hold further diuretics including Aldactone.  Discussed with heart failure team who will also evaluate the patient.  Family updated.  Signed, Rozann Lesches, MD  01/24/2022, 11:27 AM

## 2022-01-24 NOTE — Progress Notes (Signed)
   Family has decided on Palliative/Comfort Care. Palliative Care consult placed.   Kevin Bickers, MD  1:57 PM

## 2022-01-24 NOTE — Progress Notes (Addendum)
PROGRESS NOTE    Kevin Mcclain  TGG:[id number] DOB: Aug 04, 1945 DOA: 01/23/2022 PCP: Marin Olp, MD  76/male, chronically ill with CAD/CABG, chronic systolic CHF-EF 45 to 70%, moderate to severe MR, remote history of tonsillar CA status post surgery, XRT, chronic pain on long-term narcotics, diabetes, ongoing decline for 6 months.  Intermittent nausea vomiting and abdominal discomfort, suspected to be related to medications, was taken off metformin few months ago, symptoms improved temporarily after starting Pepcid. -He had a bout of vomiting and abdominal discomfort 11/23, came to the ER, had a CT abdomen pelvis which noted bilateral pleural effusions otherwise nonacute findings in the abdomen, he was discharged home. He also reports ongoing shortness of breath for over a week, progressively worse since 11/23, with some tightness across his chest, has a mild cough, denies fevers chills, denies edema, came to the ER, vital stable, labs with creatinine of 1.6, troponin 978 >1119 -11/24 evening : admitted, started Lasix, echo and VQ scan ordered  Subjective: Remains weak, some shortness of breath, mild abdominal discomfort  Assessment and Plan:  Acute on chronic systolic CHF Low output state, early shock Moderate to severe MR, elevated troponins -Last echo 5/23 with EF of 45-50%, moderate to severe MR, grade 2 diastolic dysfunction -Now with elevated troponins, and worsening creatinine, liver function and rising lactate, all concerning for low output state, early cardiogenic shock -Will discuss with cardiology, Echo pending, changed to STAT, Place PICC line, Hold further Lasix, Aldactone and Toprol -Continue IV heparin, follow-up VQ scan   CAD/CABG ? 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 -Treated with surgery and XRT     GERD (gastroesophageal reflux disease) -Add PPI   Severe protein calorie malnutrition   Type 2 diabetes mellitus -Recently taken off metformin for GI symptoms -add sliding scale insulin   DVT prophylaxis: heparin Code Status: DNR Family Communication: Wife at bedside Disposition Plan: To be determined  Consultants: Cardiology, CHF team   Procedures:   Antimicrobials:    Objective: Vitals:   01/24/22 1130 01/24/22 1200 01/24/22 1230 01/24/22 1300  BP: (!) 106/57 115/67 (!) 105/56 107/70  Pulse: 62 75 72 74  Resp: (!) 21 10    Temp:      TempSrc:      SpO2: 100% 99% 100% 100%   No intake or output data in the 24 hours ending 01/24/22 1313 There were no vitals filed for this visit.  Examination:  General exam: Chronically ill male sitting up in bed, AAO x 3 HEENT: Positive JVD CVS: S1-S2, regular rhythm, systolic murmur Lungs: Decreased breath sounds at the bases Abdomen: Soft, nontender, bowel sounds present Extremities: No edema Skin: No rashes Psychiatry:  Mood & affect appropriate.     Data Reviewed:   CBC: Recent Labs  Lab 01/22/22 1154 01/23/22 1514 01/24/22 0408  WBC 10.9* 9.1 10.3  HGB 12.4* 11.8* 11.1*  HCT 35.9* 36.5* 33.5*  MCV 88.4 93.6 91.3  PLT 272 236 350   Basic Metabolic  Panel: Recent Labs  Lab 01/22/22 1154 01/23/22 1514 01/23/22 1653 01/24/22 0408  NA 139 139  --  139  K 2.6* 2.7*  --  4.3  CL 97* 99  --  100  CO2 26 23  --  23  GLUCOSE 189* 214*  --  202*  BUN 12 16  --  24*  CREATININE 1.26* 1.61*   --  2.03*  CALCIUM 9.1 9.0  --  8.9  MG  --   --  2.1  --    GFR: CrCl cannot be calculated (Unknown ideal weight.). Liver Function Tests: Recent Labs  Lab 01/22/22 1154 01/23/22 1514 01/24/22 0408  AST 34 43* 824*  ALT 31 33 526*  ALKPHOS 53 52 100  BILITOT 2.7* 2.1* 3.0*  PROT 6.3* 6.3* 5.7*  ALBUMIN 3.5 3.6 3.2*   Recent Labs  Lab 01/22/22 1154  LIPASE 28   Recent Labs  Lab 01/22/22 1247  AMMONIA 22   Coagulation Profile: Recent Labs  Lab 01/22/22 1247  INR 1.1   Cardiac Enzymes: No results for input(s): "CKTOTAL", "CKMB", "CKMBINDEX", "TROPONINI" in the last 168 hours. BNP (last 3 results) No results for input(s): "PROBNP" in the last 8760 hours. HbA1C: No results for input(s): "HGBA1C" in the last 72 hours. CBG: No results for input(s): "GLUCAP" in the last 168 hours. Lipid Profile: No results for input(s): "CHOL", "HDL", "LDLCALC", "TRIG", "CHOLHDL", "LDLDIRECT" in the last 72 hours. Thyroid Function Tests: No results for input(s): "TSH", "T4TOTAL", "FREET4", "T3FREE", "THYROIDAB" in the last 72 hours. Anemia Panel: No results for input(s): "VITAMINB12", "FOLATE", "FERRITIN", "TIBC", "IRON", "RETICCTPCT" in the last 72 hours. Urine analysis:    Component Value Date/Time   COLORURINE AMBER (A) 01/22/2022 1149   APPEARANCEUR HAZY (A) 01/22/2022 1149   LABSPEC 1.027 01/22/2022 1149   PHURINE 5.0 01/22/2022 1149   GLUCOSEU NEGATIVE 01/22/2022 1149   GLUCOSEU NEGATIVE 01/31/2007 1020   HGBUR NEGATIVE 01/22/2022 1149   BILIRUBINUR NEGATIVE 01/22/2022 1149   KETONESUR 5 (A) 01/22/2022 1149   PROTEINUR 100 (A) 01/22/2022 1149   UROBILINOGEN 1.0 08/30/2010 0121   NITRITE NEGATIVE 01/22/2022 1149   LEUKOCYTESUR NEGATIVE 01/22/2022 1149   Sepsis Labs: '@LABRCNTIP'$ (procalcitonin:4,lacticidven:4)  ) Recent Results (from the past 240 hour(s))  Resp Panel by RT-PCR (Flu A&B, Covid) Anterior Nasal Swab     Status: None   Collection Time: 01/22/22  3:48 PM    Specimen: Anterior Nasal Swab  Result Value Ref Range Status   SARS Coronavirus 2 by RT PCR NEGATIVE NEGATIVE Final    Comment: (NOTE) SARS-CoV-2 target nucleic acids are NOT DETECTED.  The SARS-CoV-2 RNA is generally detectable in upper respiratory specimens during the acute phase of infection. The lowest concentration of SARS-CoV-2 viral copies this assay can detect is 138 copies/mL. A negative result does not preclude SARS-Cov-2 infection and should not be used as the sole basis for treatment or other patient management decisions. A negative result may occur with  improper specimen collection/handling, submission of specimen other than nasopharyngeal swab, presence of viral mutation(s) within the areas targeted by this assay, and inadequate number of viral copies(<138 copies/mL). A negative result must be combined with clinical observations, patient history, and epidemiological information. The expected result is Negative.  Fact Sheet for Patients:  EntrepreneurPulse.com.au  Fact Sheet for Healthcare Providers:  IncredibleEmployment.be  This test is no t yet approved or cleared by the [place] FDA and  has been authorized for detection and/or diagnosis of SARS-CoV-2 by FDA under  an Emergency Use Authorization (EUA). This EUA will remain  in effect (meaning this test can be used) for the duration of the COVID-19 declaration under Section 564(b)(1) of the Act, 21 U.S.C.section 360bbb-3(b)(1), unless the authorization is terminated  or revoked sooner.       Influenza A by PCR NEGATIVE NEGATIVE Final   Influenza B by PCR NEGATIVE NEGATIVE Final    Comment: (NOTE) The Xpert Xpress SARS-CoV-2/FLU/RSV plus assay is intended as an aid in the diagnosis of influenza from Nasopharyngeal swab specimens and should not be used as a sole basis for treatment. Nasal washings and aspirates are unacceptable for Xpert Xpress  SARS-CoV-2/FLU/RSV testing.  Fact Sheet for Patients: EntrepreneurPulse.com.au  Fact Sheet for Healthcare Providers: IncredibleEmployment.be  This test is not yet approved or cleared by the [place] FDA and has been authorized for detection and/or diagnosis of SARS-CoV-2 by FDA under an Emergency Use Authorization (EUA). This EUA will remain in effect (meaning this test can be used) for the duration of the COVID-19 declaration under Section 564(b)(1) of the Act, 21 U.S.C. section 360bbb-3(b)(1), unless the authorization is terminated or revoked.  Performed at [facility], [place] [address], [place], [place] [address]      Radiology Studies: [place] EKG SITE RITE  Result Date: 01/24/2022 If Site Rite image not attached, placement could not be confirmed due to current cardiac rhythm.  DG Chest 2 View  Result Date: 01/23/2022 CLINICAL DATA:  Shortness of breath and upper abdominal pain. EXAM: CHEST - 2 VIEW COMPARISON:  09/01/2010 FINDINGS: Sternotomy wires intact. Lungs are hypoinflated with mild blunting of the right costophrenic angle likely small amount of pleural fluid versus pleuroparenchymal scarring. No airspace consolidation. Cardiomediastinal silhouette and remainder of the exam is unremarkable. IMPRESSION: Hypoinflation with mild blunting of the right costophrenic angle likely small amount of pleural fluid versus pleuroparenchymal scarring. Electronically Signed   By: Marin Olp M.D.   On: 01/23/2022 15:47   CT Abdomen Pelvis W Contrast  Result Date: 01/22/2022 CLINICAL DATA:  Acute abdominal pain. EXAM: CT ABDOMEN AND PELVIS WITH CONTRAST TECHNIQUE: Multidetector CT imaging of the abdomen and pelvis was performed using the standard protocol following bolus administration of intravenous contrast. RADIATION DOSE REDUCTION: This exam was performed according to the departmental dose-optimization program which includes automated  exposure control, adjustment of the mA and/or kV according to patient size and/or use of iterative reconstruction technique. CONTRAST:  19m OMNIPAQUE IOHEXOL 350 MG/ML SOLN COMPARISON:  CT scan 03/23/2016 FINDINGS: Lower chest: Moderate-sized bilateral pleural effusions with overlying atelectasis. The heart is within normal limits in size. No pericardial effusion. Aortic and coronary artery calcifications are noted. Hepatobiliary: Stable pneumobilia related to prior cholecystectomy and sphincterotomy. No hepatic lesions or intrahepatic biliary dilatation. No common bile duct dilatation. Pancreas: No mass, inflammation or ductal dilatation. Spleen: Stable 4 cm splenic lesions, likely a benign hemangioma. No change since prior studies. Adrenals/Urinary Tract: The adrenal glands are normal. Stable bilateral simple renal cysts. No worrisome renal lesions. No follow-up imaging is necessary. The bladder is unremarkable. Stomach/Bowel: The stomach, duodenum, small bowel and colon are unremarkable. No acute inflammatory process, mass lesions or obstructive findings. The terminal ileum and appendix are normal. Scattered descending and sigmoid colon diverticulosis Vascular/Lymphatic: Stable atherosclerotic calcifications involving the aorta and branch vessels but no aneurysm or dissection. The major venous structures are patent. No mesenteric or retroperitoneal mass or adenopathy. Reproductive: The prostate gland and seminal vesicles are unremarkable. Other: No pelvic mass or adenopathy. No  free pelvic fluid collections. No inguinal mass or adenopathy. No abdominal wall hernia or subcutaneous lesions. Musculoskeletal: No significant bony findings. IMPRESSION: 1. No acute abdominal/pelvic findings, mass lesions or adenopathy. 2. Moderate-sized bilateral pleural effusions with overlying atelectasis. 3. Stable 4 cm splenic lesion, likely benign hemangioma. 4. Stable pneumobilia related to prior cholecystectomy and  sphincterotomy. 5. Aortic atherosclerosis. Aortic Atherosclerosis (ICD10-I70.0). Electronically Signed   By: Marijo Sanes M.D.   On: 01/22/2022 14:16     Scheduled Meds:  [START ON 01/26/2022] aspirin EC  81 mg Oral Once per day on Mon Thu   atorvastatin  20 mg Oral Daily   [START ON 01/26/2022] fentaNYL  1 patch Transdermal Q72H   levothyroxine  100 mcg Oral Q0600   pantoprazole  40 mg Oral Q1200   sertraline  100 mg Oral QHS   Continuous Infusions:  heparin 750 Units/hr (01/24/22 0522)     LOS: 1 day    Time spent: 42mn  PDomenic Polite MD [facility]   01/24/2022, 1:13 PM

## 2022-01-24 NOTE — ED Notes (Signed)
Went to try and stick pt and pt provider stated to "hold off for a minute"

## 2022-01-24 NOTE — ED Notes (Signed)
Echo at bedside

## 2022-01-24 NOTE — ED Notes (Signed)
ED TO INPATIENT HANDOFF REPORT  ED Nurse Name and Phone #: Jeannie Done [phone]  S Name/Age/Gender Kevin Mcclain 76 y.o. male Room/Bed: 003C/003C  Code Status   Code Status: DNR  Home/SNF/Other   from home, possibility for d/c to Hospice house but unsure. Patient oriented to: self, place, time, and situation Is this baseline? Yes    Triage Complete: Triage complete  Chief Complaint CHF (congestive heart failure) ([place]) [I50.9]  Triage Note Pt here with increasing shob.  Told his wife he needed to come back to the hospital as "he couldn't make it tonight feeling like this"   "feels like he can't breathe well"    Allergies Allergies  Allergen Reactions   Ace Inhibitors Other (See Comments) and Cough    OK with ramipril   Codeine Itching   Januvia [Sitagliptin] Itching   Tessalon [Benzonatate] Swelling    Throat swelling    Level of Care/Admitting Diagnosis ED Disposition     ED Disposition  Admit   Condition  --   [facility]rency: [facility] [[id number]]  Level of Care: Med-Surg [16]  May admit patient to [facility] or [facility] if equivalent level of care is available:: No  Covid Evaluation: Asymptomatic - no recent exposure (last 10 days) testing not required  Diagnosis: CHF (congestive heart failure) [facility] [[id number]]  Admitting Physician: Domenic Polite [[id number]]  Attending Physician: Domenic Polite [[id number]]  Certification:: I certify this patient will need inpatient services for at least 2 midnights          B Medical/Surgery History Past Medical History:  Diagnosis Date   Allergy    Arthritis    Ascending aortic aneurysm ([place]) 03/25/2016   03/23/16 CTA chest Mildly aneurysmal ascending aorta at 4.0 cm.  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    Hyperlipidemia    Hyperplastic colon polyp    Hypertension    Kidney stones    Moderate to severe mitral regurgitation 08/19/2021   Echocardiogram 06/2021: EF 45-50, mod to severe MR   Rheumatoid arthritis ([place])    Shingles outbreak 04/23/15   left shoulder and left arm   Vertigo    Past Surgical History:  Procedure Laterality Date   CHOLECYSTECTOMY  2012   COLONOSCOPY W/ POLYPECTOMY  2015   CORONARY ARTERY BYPASS GRAFT  2005   6 grafts   CYSTOSCOPY     kidney stone removal   IR GASTROSTOMY TUBE REMOVAL  06/25/2016   LITHOTRIPSY     MULTIPLE EXTRACTIONS WITH ALVEOLOPLASTY N/A 11/14/2014   Procedure: Extraction of tooth #2 and 31 with alveoloplasty after sectioning of bridge at distal #29 with full mouth debridement of remaining dention;  Surgeon: Lenn Cal, DDS;  Location: [facility];  Service: Oral Surgery;  Laterality: N/A;  NASAL SINUS SURGERY Right 11/14/2014   Procedure: RIGHT ENDOSCOPIC ANTROSTOMY AND ANTERIOR ETHMOIDECTOMY ;  Surgeon: Jodi Marble, MD;  Location: [facility];  Service: ENT;  Laterality: Right;   RIGHT/LEFT HEART CATH AND CORONARY/GRAFT ANGIOGRAPHY N/A 09/01/2021   Procedure: RIGHT/LEFT HEART CATH AND CORONARY/GRAFT ANGIOGRAPHY;  Surgeon: Sherren Mocha, MD;  Location: [facility];  Service: Cardiovascular;  Laterality: N/A;     A IV Location/Drains/Wounds Patient Lines/Drains/Airways Status     Active Line/Drains/Airways     Name Placement date Placement time Site Days   Peripheral IV 01/23/22 20 G  Anterior;Right Forearm 01/23/22  1644  Forearm  1   Incision (Closed) 11/14/14 Nose 11/14/14  1100  -- 2628            Intake/Output Last 24 hours No intake or output data in the 24 hours ending 01/24/22 1852  Labs/Imaging Results for orders placed or performed during the hospital encounter of 01/23/22 (from the past 48 hour(s))  CBC     Status: Abnormal   Collection Time: 01/23/22  3:14 PM  Result Value Ref Range   WBC 9.1 4.0 - 10.5 K/uL   RBC 3.90 (L) 4.22 - 5.81 MIL/uL   Hemoglobin 11.8 (L) 13.0 - 17.0 g/dL   HCT 36.5 (L) 39.0 - 52.0 %   MCV 93.6 80.0 - 100.0 fL   MCH 30.3 26.0 - 34.0 pg   MCHC 32.3 30.0 - 36.0 g/dL   RDW 14.7 11.5 - 15.5 %   Platelets 236 150 - 400 K/uL   nRBC 0.0 0.0 - 0.2 %    Comment: Performed at [facility], [place] [address], [place], [place] [address]  Troponin I (High Sensitivity)     Status: Abnormal   Collection Time: 01/23/22  3:14 PM  Result Value Ref Range   Troponin I (High Sensitivity) 978 (HH) <18 ng/L    Comment: CRITICAL RESULT CALLED TO, READ BACK BY AND VERIFIED WITH C,BERRIER RN '@1614'$  01/23/22 E,[place] (NOTE) Elevated high sensitivity troponin I (hsTnI) values and significant  changes across serial measurements may suggest ACS but many other  chronic and acute conditions are known to elevate hsTnI results.  Refer to the "Links" section for chest pain algorithms and additional  guidance. Performed at [facility], [place] [address], [place], [place] [address]   Comprehensive metabolic panel     Status: Abnormal   Collection Time: 01/23/22  3:14 PM  Result Value Ref Range   Sodium 139 135 - 145 mmol/L   Potassium 2.7 (LL) 3.5 - 5.1 mmol/L    Comment: CRITICAL RESULT CALLED TO, READ BACK BY AND VERIFIED WITH C,BERRIER RN '@1614'$  01/23/22 E,[place]   Chloride 99 98 - 111 mmol/L   CO2 23 22 - 32 mmol/L   Glucose, Bld 214 (H) 70 - 99 mg/dL    Comment: Glucose reference range applies only to samples taken after fasting for at  least 8 hours.   BUN 16 8 - 23 mg/dL   Creatinine, Ser 1.61 (H) 0.61 - 1.24 mg/dL   Calcium 9.0 8.9 - 10.3 mg/dL   Total Protein 6.3 (L) 6.5 - 8.1 g/dL   Albumin 3.6 3.5 - 5.0 g/dL   AST 43 (H) 15 - 41 U/L   ALT 33 0 - 44 U/L   Alkaline Phosphatase 52 38 - 126 U/L   Total Bilirubin 2.1 (H) 0.3 - 1.2 mg/dL   GFR, Estimated 44 (L) >60 mL/min    Comment: (NOTE)  Calculated using the CKD-EPI Creatinine Equation (2021)    Anion gap 17 (H) 5 - 15    Comment: Performed at [facility], [place] [address] [place], [place] [address]  Lactic acid, plasma     Status: Abnormal   Collection Time: 01/23/22  3:15 PM  Result Value Ref Range   Lactic Acid, Venous 4.6 (HH) 0.5 - 1.9 mmol/L    Comment: CRITICAL RESULT CALLED TO, READ BACK BY AND VERIFIED WITH C,BERRIER RN '@1608'$  01/23/22 E,[place] Performed at [facility] [address], [place], [place] [address]   Lactic acid, plasma     Status: Abnormal   Collection Time: 01/23/22  4:53 PM  Result Value Ref Range   Lactic Acid, Venous 3.5 (HH) 0.5 - 1.9 mmol/L    Comment: CRITICAL VALUE NOTED.  VALUE IS CONSISTENT WITH PREVIOUSLY REPORTED AND CALLED VALUE. Performed at [facility], [place] [address], [place], [place] [address]   Brain natriuretic peptide     Status: Abnormal   Collection Time: 01/23/22  4:53 PM  Result Value Ref Range   B Natriuretic Peptide 2,942.0 (H) 0.0 - 100.0 pg/mL    Comment: Performed at [facility] [address], [place], [place] [address]  Troponin I (High Sensitivity)     Status: Abnormal   Collection Time: 01/23/22  4:53 PM  Result Value Ref Range   Troponin I (High Sensitivity) 1,119 (HH) <18 ng/L    Comment: CRITICAL VALUE NOTED. VALUE IS CONSISTENT WITH PREVIOUSLY REPORTED/CALLED VALUE (NOTE) Elevated high sensitivity troponin I (hsTnI) values and significant  changes across serial measurements may suggest ACS but many other  chronic and acute conditions are known to elevate hsTnI  results.  Refer to the "Links" section for chest pain algorithms and additional  guidance. Performed at [facility], [place] [address] [place], [place] [address]   Magnesium     Status: None   Collection Time: 01/23/22  4:53 PM  Result Value Ref Range   Magnesium 2.1 1.7 - 2.4 mg/dL    Comment: Performed at [facility] [address] [place], [place] [address]  Troponin I (High Sensitivity)     Status: Abnormal   Collection Time: 01/23/22  8:26 PM  Result Value Ref Range   Troponin I (High Sensitivity) 957 (HH) <18 ng/L    Comment: CRITICAL VALUE NOTED. VALUE IS CONSISTENT WITH PREVIOUSLY REPORTED/CALLED VALUE (NOTE) Elevated high sensitivity troponin I (hsTnI) values and significant  changes across serial measurements may suggest ACS but many other  chronic and acute conditions are known to elevate hsTnI results.  Refer to the "Links" section for chest pain algorithms and additional  guidance. Performed at [facility], [place] [address] [place], [place] [address]   Heparin level (unfractionated)     Status: Abnormal   Collection Time: 01/24/22  4:08 AM  Result Value Ref Range   Heparin Unfractionated 0.14 (L) 0.30 - 0.70 IU/mL    Comment: (NOTE) The clinical reportable range upper limit is being lowered to >1.10 to align with the FDA approved guidance for the current laboratory assay.  If heparin results are below expected values, and patient dosage has  been confirmed, suggest follow up testing of antithrombin III levels. Performed at [facility], [place] [address] [place], [place] [address]   CBC     Status: Abnormal   Collection Time: 01/24/22  4:08 AM  Result Value Ref Range  WBC 10.3 4.0 - 10.5 K/uL   RBC 3.67 (L) 4.22 - 5.81 MIL/uL   Hemoglobin 11.1 (L) 13.0 - 17.0 g/dL   HCT 33.5 (L) 39.0 - 52.0 %   MCV 91.3 80.0 - 100.0 fL   MCH 30.2 26.0 - 34.0 pg   MCHC 33.1 30.0 - 36.0 g/dL   RDW 14.7 11.5 - 15.5 %   Platelets 268 150 - 400 K/uL    nRBC 0.0 0.0 - 0.2 %    Comment: Performed at [facility], [place] [address], [place], [place] [address]  Comprehensive metabolic panel     Status: Abnormal   Collection Time: 01/24/22  4:08 AM  Result Value Ref Range   Sodium 139 135 - 145 mmol/L   Potassium 4.3 3.5 - 5.1 mmol/L    Comment: HEMOLYSIS AT THIS LEVEL MAY AFFECT RESULT   Chloride 100 98 - 111 mmol/L   CO2 23 22 - 32 mmol/L   Glucose, Bld 202 (H) 70 - 99 mg/dL    Comment: Glucose reference range applies only to samples taken after fasting for at least 8 hours.   BUN 24 (H) 8 - 23 mg/dL   Creatinine, Ser 2.03 (H) 0.61 - 1.24 mg/dL   Calcium 8.9 8.9 - 10.3 mg/dL   Total Protein 5.7 (L) 6.5 - 8.1 g/dL   Albumin 3.2 (L) 3.5 - 5.0 g/dL   AST 824 (H) 15 - 41 U/L    Comment: HEMOLYSIS AT THIS LEVEL MAY AFFECT RESULT   ALT 526 (H) 0 - 44 U/L    Comment: HEMOLYSIS AT THIS LEVEL MAY AFFECT RESULT   Alkaline Phosphatase 100 38 - 126 U/L   Total Bilirubin 3.0 (H) 0.3 - 1.2 mg/dL    Comment: HEMOLYSIS AT THIS LEVEL MAY AFFECT RESULT   GFR, Estimated 33 (L) >60 mL/min    Comment: (NOTE) Calculated using the CKD-EPI Creatinine Equation (2021)    Anion gap 16 (H) 5 - 15    Comment: Performed at [facility], [place] [address] [place], [place] [address]  Lactic acid, plasma     Status: Abnormal   Collection Time: 01/24/22  7:00 AM  Result Value Ref Range   Lactic Acid, Venous 4.4 (HH) 0.5 - 1.9 mmol/L    Comment: CRITICAL VALUE NOTED. VALUE IS CONSISTENT WITH PREVIOUSLY REPORTED/CALLED VALUE Performed at [facility], [place] [address], [place], [place] [address]    ECHOCARDIOGRAM COMPLETE  Result Date: 01/24/2022    ECHOCARDIOGRAM REPORT   Patient Name:   [facility] Kevin Mcclain Date of Exam: 01/24/2022 Medical Rec #:  [id number]         Height:       65.0 in Accession #:    [id number]        Weight:       124.0 lb Date of Birth:  Oct 25, 1945          BSA:          1.614 m Patient Age:    83 years          BP:            115/67 mmHg Patient Gender: M                 HR:           76 bpm. Exam Location:  Inpatient Procedure: 2D Echo, Cardiac Doppler and Color Doppler STAT ECHO Indications:    CHF-Acute Diastolic Y56.38  History:  Patient has prior history of Echocardiogram examinations, most                 recent 07/04/2021. CHF, CAD; Risk Factors:Hypertension, Diabetes                 and Dyslipidemia. Elevated troponin, HFmrEF (heart failure with                 mldly reduced ejection fraction) ([place]), Moderate to severe                 mitral regurgitation.  Sonographer:    Alvino Chapel RCS Referring Phys: [place]  1. Left ventricular ejection fraction, by estimation, is 25 to 30%. The left ventricle has severely decreased function. The left ventricle demonstrates regional wall motion abnormalities (see scoring diagram/findings for description). The left ventricular internal cavity size was mildly dilated. There is mild left ventricular hypertrophy. Left ventricular diastolic parameters are consistent with Grade II diastolic dysfunction (pseudonormalization).  2. Right ventricular systolic function is moderately reduced. The right ventricular size is normal. There is mildly elevated pulmonary artery systolic pressure. The estimated right ventricular systolic pressure is 93.7 mmHg.  3. Left atrial size was severely dilated.  4. Right atrial size was mildly dilated.  5. Moderate pleural effusion in the left lateral region.  6. PISA ERO 0.35 cm2 and regugitant volume 54 ml. The mitral valve is abnormal, thickened and with restricted posterio leaflet motion and incomplete coaptation. Moderate to severe mitral valve regurgitation, upper end of scale, Carpentier class IIIb.  7. The aortic valve is tricuspid. Aortic valve regurgitation is mild to moderate. Aortic valve sclerosis is present, with no evidence of aortic valve stenosis.  8. The inferior vena cava is normal in size with <50% respiratory  variability, suggesting right atrial pressure of 8 mmHg. Comparison(s): Prior images reviewed side by side. LVEF has decreased in comparison. Mitral regurgitation borderline severe by PISA. FINDINGS  Left Ventricle: Left ventricular ejection fraction, by estimation, is 25 to 30%. The left ventricle has severely decreased function. The left ventricle demonstrates regional wall motion abnormalities. The left ventricular internal cavity size was mildly  dilated. There is mild left ventricular hypertrophy. Left ventricular diastolic parameters are consistent with Grade II diastolic dysfunction (pseudonormalization).  LV Wall Scoring: The basal inferior segment is akinetic. The entire anterior wall, entire lateral wall, entire septum, entire apex, and mid and distal inferior wall are hypokinetic. Right Ventricle: The right ventricular size is normal. No increase in right ventricular wall thickness. Right ventricular systolic function is moderately reduced. There is mildly elevated pulmonary artery systolic pressure. The tricuspid regurgitant velocity is 2.80 m/s, and with an assumed right atrial pressure of 8 mmHg, the estimated right ventricular systolic pressure is 90.2 mmHg. Left Atrium: Left atrial size was severely dilated. Right Atrium: Right atrial size was mildly dilated. Pericardium: There is no evidence of pericardial effusion. Mitral Valve: PISA ERO 0.35 cm2 and regugitant volume 54 ml. The mitral valve is abnormal. There is mild thickening of the mitral valve leaflet(s). Moderate to severe mitral valve regurgitation, with posteriorly-directed jet. MV peak gradient, 6.2 mmHg. The mean mitral valve gradient is 2.0 mmHg. Tricuspid Valve: The tricuspid valve is grossly normal. Tricuspid valve regurgitation is mild. Aortic Valve: The aortic valve is tricuspid. There is mild aortic valve annular calcification. Aortic valve regurgitation is mild to moderate. Aortic regurgitation PHT measures 400 msec. Aortic valve  sclerosis is present, with no evidence of aortic valve  stenosis.  Pulmonic Valve: The pulmonic valve was grossly normal. Pulmonic valve regurgitation is trivial. Aorta: The aortic root is normal in size and structure. Venous: The inferior vena cava is normal in size with less than 50% respiratory variability, suggesting right atrial pressure of 8 mmHg. IAS/Shunts: No atrial level shunt detected by color flow Doppler. Additional Comments: There is a moderate pleural effusion in the left lateral region.  LEFT VENTRICLE PLAX 2D LVIDd:         5.60 cm      Diastology LVIDs:         4.90 cm      LV e' medial:    2.89 cm/s LV PW:         1.00 cm      LV E/e' medial:  39.1 LV IVS:        1.00 cm      LV e' lateral:   4.81 cm/s LVOT diam:     2.00 cm      LV E/e' lateral: 23.5 LV SV:         41 LV SV Index:   25 LVOT Area:     3.14 cm  LV Volumes (MOD) LV vol d, MOD A2C: 149.0 ml LV vol d, MOD A4C: 140.5 ml LV vol s, MOD A2C: 107.0 ml LV vol s, MOD A4C: 87.7 ml LV SV MOD A2C:     42.0 ml LV SV MOD A4C:     140.5 ml LV SV MOD BP:      46.2 ml RIGHT VENTRICLE RV S prime:     7.15 cm/s TAPSE (M-mode): 1.4 cm LEFT ATRIUM              Index        RIGHT ATRIUM           Index LA diam:        4.20 cm  2.60 cm/m   RA Area:     20.20 cm LA Vol (A2C):   113.0 ml 69.99 ml/m  RA Volume:   58.90 ml  36.48 ml/m LA Vol (A4C):   70.5 ml  43.67 ml/m LA Biplane Vol: 90.5 ml  56.06 ml/m  AORTIC VALVE LVOT Vmax:   69.20 cm/s LVOT Vmean:  43.200 cm/s LVOT VTI:    0.131 m AI PHT:      400 msec  AORTA Ao Root diam: 3.40 cm MITRAL VALVE                  TRICUSPID VALVE MV Area (PHT): 4.19 cm       TR Peak grad:   31.4 mmHg MV Area VTI:   1.43 cm       TR Vmax:        280.00 cm/s MV Peak grad:  6.2 mmHg MV Mean grad:  2.0 mmHg       SHUNTS MV Vmax:       1.24 m/s       Systemic VTI:  0.13 m MV Vmean:      59.1 cm/s      Systemic Diam: 2.00 cm MV Decel Time: 181 msec MR Peak grad:    85.4 mmHg MR Mean grad:    47.0 mmHg MR Vmax:          462.00 cm/s MR Vmean:        308.0 cm/s MR PISA:         4.02 cm MR PISA Eff ROA: 28 mm MR PISA Radius:  0.80 cm MV E velocity: 113.00 cm/s MV A velocity: 65.60 cm/s MV E/A ratio:  1.72 Rozann Lesches MD Electronically signed by Rozann Lesches MD Signature Date/Time: 01/24/2022/1:23:10 PM    Final    [place] EKG SITE RITE  Result Date: 01/24/2022 If Site Rite image not attached, placement could not be confirmed due to current cardiac rhythm.  DG Chest 2 View  Result Date: 01/23/2022 CLINICAL DATA:  Shortness of breath and upper abdominal pain. EXAM: CHEST - 2 VIEW COMPARISON:  09/01/2010 FINDINGS: Sternotomy wires intact. Lungs are hypoinflated with mild blunting of the right costophrenic angle likely small amount of pleural fluid versus pleuroparenchymal scarring. No airspace consolidation. Cardiomediastinal silhouette and remainder of the exam is unremarkable. IMPRESSION: Hypoinflation with mild blunting of the right costophrenic angle likely small amount of pleural fluid versus pleuroparenchymal scarring. Electronically Signed   By: Marin Olp M.D.   On: 01/23/2022 15:47    Pending Labs Unresulted Labs (From admission, onward)    None       Vitals/Pain Today's Vitals   01/24/22 1700 01/24/22 1730 01/24/22 1800 01/24/22 1830  BP: 107/67 104/74 119/66 (!) 110/59  Pulse: 72 73 80 74  Resp: (!) 24 10 (!) 21 13  Temp:      TempSrc:      SpO2: 100% 100% 95% 100%  PainSc:        Isolation Precautions No active isolations  Medications Medications  aspirin EC tablet 81 mg (has no administration in time range)  fentaNYL (DURAGESIC) 50 MCG/HR 1 patch (has no administration in time range)  nitroGLYCERIN (NITROSTAT) SL tablet 0.4 mg (has no administration in time range)  sertraline (ZOLOFT) tablet 100 mg (100 mg Oral Given 01/23/22 2122)  acetaminophen (TYLENOL) tablet 650 mg (has no administration in time range)    Or  acetaminophen (TYLENOL) suppository 650 mg (has no  administration in time range)  levothyroxine (SYNTHROID) tablet 100 mcg (100 mcg Oral Given 01/24/22 0744)  oxyCODONE (Oxy IR/ROXICODONE) immediate release tablet 10-15 mg (15 mg Oral Given 01/24/22 0743)  promethazine (PHENERGAN) injection 12.5 mg (12.5 mg Intramuscular Given 01/24/22 0958)  LORazepam (ATIVAN) tablet 1 mg (has no administration in time range)    Or  LORazepam (ATIVAN) 2 MG/ML concentrated solution 1 mg (has no administration in time range)    Or  LORazepam (ATIVAN) injection 1 mg (has no administration in time range)  haloperidol (HALDOL) tablet 2 mg (has no administration in time range)    Or  haloperidol (HALDOL) 2 MG/ML solution 2 mg (has no administration in time range)    Or  haloperidol lactate (HALDOL) injection 2 mg (has no administration in time range)  diphenhydrAMINE (BENADRYL) injection 25 mg (has no administration in time range)  ondansetron (ZOFRAN-ODT) disintegrating tablet 4 mg (has no administration in time range)    Or  ondansetron (ZOFRAN) injection 4 mg (has no administration in time range)  glycopyrrolate (ROBINUL) tablet 1 mg (has no administration in time range)    Or  glycopyrrolate (ROBINUL) injection 0.2 mg (has no administration in time range)    Or  glycopyrrolate (ROBINUL) injection 0.2 mg (has no administration in time range)  polyvinyl alcohol (LIQUIFILM TEARS) 1.4 % ophthalmic solution 1 drop (has no administration in time range)  morphine (PF) 2 MG/ML injection 2 mg (has no administration in time range)  lactated ringers bolus 250 mL (0 mLs Intravenous Stopped 01/23/22 1842)  heparin bolus via infusion 3,300 Units (3,300 Units Intravenous Bolus from Bag 01/23/22  2121)  potassium chloride (KLOR-CON) packet 40 mEq (40 mEq Oral Given 01/24/22 0336)  heparin bolus via infusion 1,500 Units (1,500 Units Intravenous Bolus from Bag 01/24/22 0532)    Mobility walks with person assist Low fall risk   Focused Assessments Pulmonary Assessment  Handoff:  Lung sounds: Bilateral Breath Sounds: Diminished L Breath Sounds: Diminished R Breath Sounds: Diminished O2 Device: Nasal Cannula O2 Flow Rate (L/min): 3 L/min    R Recommendations: See Admitting Provider Note  Report given to:   Additional Notes: Palliative care pt-- has been resting, appears comfortable. Family at bedside.

## 2022-01-25 DIAGNOSIS — I5023 Acute on chronic systolic (congestive) heart failure: Secondary | ICD-10-CM

## 2022-01-25 DIAGNOSIS — Z515 Encounter for palliative care: Secondary | ICD-10-CM | POA: Diagnosis not present

## 2022-01-25 DIAGNOSIS — N179 Acute kidney failure, unspecified: Secondary | ICD-10-CM | POA: Diagnosis not present

## 2022-01-25 DIAGNOSIS — I340 Nonrheumatic mitral (valve) insufficiency: Secondary | ICD-10-CM

## 2022-01-25 NOTE — Progress Notes (Signed)
Daily Progress Note   Patient Name: Kevin Mcclain       Date: 01/25/2022 DOB: [birth year]  Age: 76 y.o. MRN#: [id number] Attending Physician: Domenic Polite, MD Primary Care Physician: Marin Olp, MD Admit Date: 01/23/2022  Reason for Consultation/Follow-up: Establishing goals of care  Subjective: Medical records reviewed including progress notes, labs, imaging.  I called patient's wife to arrange a time for today's family meeting.  Patient's son who is just arrived from [place] answered the phone.  He states she has just returned from the hospital and is quite tired.  He shares that after discussion as a family, they are leaning towards residential hospice placement. Patient's wife cannot take care of him at home alone with limited hospice support.  He request this PA speak further with his 2 sisters who are present at the hospital with the patient.  I then went to the bedside and assessed the patient. He is breathing comfortably, smiles at this PA.  His daughter Bethena Roys is present visiting and tells me he is very fatigued after going to the restroom.  She called [name] to join at the bedside for a discussion regarding hospice.  We then reviewed the referral process and options for the different hospice facilities in the area.  Patient's 2 daughters agree that residential hospice placement would be the best option and prefer [facility] given their location in [place].  His daughter [name] shares that he has had no urine output and notes this is after a period of about 18 hours when he made only 200 mL of urine.  He is not drinking anything or eating anything, only asking for ice chips.  We discussed the prognosis of likely days to weeks and family verbalized understanding.  We discussed  symptom management strategies and the option for scheduled medication after review of patient's MAR (he has received 4 doses of as needed IV morphine and 1 tablet of oxycodone).  Patient prefers morphine over oxycodone per RN.  Family does not feel that scheduling medications is indicated at this time.  Questions and concerns addressed. PMT will continue to support holistically.   Length of Stay: 2  Physical Exam Vitals and nursing note reviewed.  Constitutional:      General: He is not in acute distress.    Appearance: He is ill-appearing.  Interventions: Nasal cannula in place.     Comments: 4L  Cardiovascular:     Rate and Rhythm: Normal rate.  Pulmonary:     Effort: Pulmonary effort is normal.  Skin:    General: Skin is warm and dry.  Neurological:     Mental Status: He is alert.            Vital Signs: BP (!) 111/57 (BP Location: Right Arm)   Pulse 64   Temp 98.7 F (37.1 C) (Oral)   Resp 16   SpO2 100%  SpO2: SpO2: 100 % O2 Device: O2 Device: Nasal Cannula O2 Flow Rate: O2 Flow Rate (L/min): 4 L/min      Palliative Assessment/Data: 20%    Palliative Care Assessment & Plan   Patient Profile: 76 y.o. male  with past medical history of  chronically ill with CAD/CABG, chronic systolic CHF-EF 45 to 93%, moderate to severe MR, remote history of tonsillar CA status post radiation 2016, chronic pain on long-term narcotics, diabetes presented to ED on 01/24/2022 from home with complaints of increasing shortness of breath.  He was previously seen in ED on 01/22/2022 with GI issues including nausea/vomiting/diarrhea.  Patient was admitted on 01/23/2022 with dyspnea on exertion, elevated troponin, hypokalemia, bilateral pleural effusions, acute on chronic systolic CHF, moderate to severe MR, AKI, severe protein calorie malnutrition.    Cardiology and heart failure team were consulted and options discussed with family to include: 1) short-course of palliative inotropes with  consideration of possible palliative mitraclip but may not be candidate 2) transition to comfort care.  Recommendation was given for full comfort care/hospice.   Assessment: End-of-life care Cardiogenic shock Severe mitral regurgitation Acute on chronic systolic heart failure secondary to ischemic cardiomyopathy AKI Shock liver Severe protein calorie malnutrition  Recommendations/Plan: Continue DNR Continue comfort care, no adjustments to medications today as family prefers to continue with as needed doses of pain medication Family has decided on residential hospice with preference for [facility]; [facility] consulted and assistance is appreciated Psychosocial and emotional support provided PMT will continue to follow and support   Prognosis:  < 2 weeks  Discharge Planning: Hospice facility  Care plan was discussed with patient, patient's family, RN   MDM high         [place], PA-C  Palliative Medicine Team Team phone # [phone]  Thank you for allowing the Palliative Medicine Team to assist in the care of this patient. Please utilize secure chat with additional questions, if there is no response within 30 minutes please call the above phone number.  Palliative Medicine Team providers are available by phone from 7am to 7pm daily and can be reached through the team cell phone.  Should this patient require assistance outside of these hours, please call the patient's attending physician.

## 2022-01-25 NOTE — Progress Notes (Signed)
Pt seen and examined, 76/M with history of CAD/CABG, chronic systolic CHF With EF of 52%, severe MR, history of tonsillar carcinoma/s/p XRT, chronic pain on long-term narcotics, type 2 diabetes mellitus, ongoing failure to thrive for 6 months presented to the ED with weakness and dyspnea. -Found to be in cardiogenic shock with worsening cardiomyopathy -Repeat echo yesterday with EF down to 20/25%, moderately decreased RV function and severe MR, with worsening kidney function and liver enzymes, lactic acid acidosis.  Seen by for heart failure team and palliative care, transition to comfort measures yesterday -Continue comfort focused care per palliative team -plan for residential hospice   Domenic Polite MD

## 2022-01-26 DIAGNOSIS — Z515 Encounter for palliative care: Secondary | ICD-10-CM | POA: Diagnosis not present

## 2022-01-26 DIAGNOSIS — I340 Nonrheumatic mitral (valve) insufficiency: Secondary | ICD-10-CM | POA: Diagnosis not present

## 2022-01-26 DIAGNOSIS — I5023 Acute on chronic systolic (congestive) heart failure: Secondary | ICD-10-CM | POA: Diagnosis not present

## 2022-01-26 DIAGNOSIS — N179 Acute kidney failure, unspecified: Secondary | ICD-10-CM | POA: Diagnosis not present

## 2022-01-26 MED ORDER — ORAL CARE MOUTH RINSE: 15.0000 mL | OROMUCOSAL | Status: DC | PRN

## 2022-01-26 MED ORDER — MORPHINE SULFATE (PF) 2 MG/ML IV SOLN
2.0000 mg | Freq: Four times a day (QID) | INTRAVENOUS | Status: DC
  Administered 2022-01-26 – 2022-01-27 (×4): 2 mg via INTRAVENOUS
  Filled 2022-01-26 (×4): qty 1

## 2022-01-26 NOTE — Progress Notes (Signed)
On assessment, noted pt has 2 fentanyl 37mg patches to right upper arm. On home med list order says one '50mg'$  patch every3 days and noted on mar next due 11/27 at 1000. Asked pt about the 2 patches and pt states he usually only places one and must have forgot to remove the other one. Pt states for RN to remove one patch now.  One 583m fentanyl patch removed and placed in narcotics waste bin in med room witnessed by [employer]  PRN IV morphine given for pain with some relief. Pt reports needing med for sleep more than pain. PRN ativan given and pt able to sleep.

## 2022-01-26 NOTE — Progress Notes (Signed)
Daily Progress Note   Patient Name: Kevin Mcclain       Date: 01/26/2022 DOB: Jul 16, 1945  Age: 76 y.o. MRN#: [id number] Attending Physician: Domenic Polite, MD Primary Care Physician: Marin Olp, MD Admit Date: 01/23/2022  Reason for Consultation/Follow-up: Establishing goals of care  Subjective: Medical records reviewed.  Patient assessed at the bedside.  He reports feeling "so-so."  His wife and daughter are present visiting.  Patient's family confirms they have discussed with social work in regards to potential transfer to [facility].  Patient requests something to help calm him we reviewed how as needed IV Ativan has been working for him, as well as the option for increasing dosage and scheduling the medication for comfort.  He feels that the current order is working well and no adjustment is required.  We also discussed his usage of IV morphine over the past 24 hours (4 doses required) and the option for scheduling this if desired.  He is agreeable to scheduling IV morphine every 6 hours. He would like a PRN dose along with his Ativan. No further needs at this time.  Discussed with RN.  Questions and concerns addressed. PMT will continue to support holistically.   Length of Stay: 3  Physical Exam Vitals and nursing note reviewed.  Constitutional:      General: He is not in acute distress.    Appearance: He is ill-appearing.     Interventions: Nasal cannula in place.     Comments: 4L  Cardiovascular:     Rate and Rhythm: Normal rate.  Pulmonary:     Effort: Pulmonary effort is normal.  Skin:    General: Skin is warm and dry.  Neurological:     Mental Status: He is alert.            Vital Signs: BP 122/68 (BP Location: Right Arm)   Pulse 66   Temp (!) 97.4 F  (36.3 C) (Oral)   Resp 17   SpO2 99%  SpO2: SpO2: 99 % O2 Device: O2 Device: Nasal Cannula O2 Flow Rate: O2 Flow Rate (L/min): 4 L/min      Palliative Assessment/Data: 20%    Palliative Care Assessment & Plan   Patient Profile: 76 y.o. male  with past medical history of  chronically ill with CAD/CABG, chronic systolic CHF-EF 45  to 50%, moderate to severe MR, remote history of tonsillar CA status post radiation 2016, chronic pain on long-term narcotics, diabetes presented to ED on 01/24/2022 from home with complaints of increasing shortness of breath.  He was previously seen in ED on 01/22/2022 with GI issues including nausea/vomiting/diarrhea.  Patient was admitted on 01/23/2022 with dyspnea on exertion, elevated troponin, hypokalemia, bilateral pleural effusions, acute on chronic systolic CHF, moderate to severe MR, AKI, severe protein calorie malnutrition.    Cardiology and heart failure team were consulted and options discussed with family to include: 1) short-course of palliative inotropes with consideration of possible palliative mitraclip but may not be candidate 2) transition to comfort care.  Recommendation was given for full comfort care/hospice.   Assessment: End-of-life care Cardiogenic shock Severe mitral regurgitation Acute on chronic systolic heart failure secondary to ischemic cardiomyopathy AKI Shock liver Severe protein calorie malnutrition  Recommendations/Plan: Continue DNR Continue comfort care; scheduled IV morphine every 6 hours Family continues to prefer residential hospice, awaiting eligibility review from [facility] Psychosocial and emotional support provided PMT will continue to follow and support   Prognosis:  < 2 weeks  Discharge Planning: Hospice facility  Care plan was discussed with patient, patient's family, RN   MDM high         [place], PA-C  Palliative Medicine Team Team phone # [phone]  Thank you for allowing  the Palliative Medicine Team to assist in the care of this patient. Please utilize secure chat with additional questions, if there is no response within 30 minutes please call the above phone number.  Palliative Medicine Team providers are available by phone from 7am to 7pm daily and can be reached through the team cell phone.  Should this patient require assistance outside of these hours, please call the patient's attending physician.

## 2022-01-26 NOTE — TOC Initial Note (Signed)
Transition of Care (TOC) - Initial/Assessment Note   Spoke to patient at bedside. Discussed referral to [facility]. Patient in agreement, consented for NCM to call his wife [name]. Called Patricia [phone] . Discussed residential hospice. [name] defrred decision to her daughter Tor Netters [phone]. NCM spoke to [place]. [name] in agreement with residential hospice, preference for [facility]. [name] will be contact person. Referral and [name]'s information given to Sarah with [facility]      Patient Details  Name: Kevin Mcclain MRN: [id number] Date of Birth: Oct 25, 1945  Transition of Care [facility]) CM/SW Contact:    Marilu Favre, RN Phone Number: 01/26/2022, 10:59 AM  Clinical Narrative:                   Expected Discharge Plan: [place]     Patient Goals and CMS Choice Patient states their goals for this hospitalization and ongoing recovery are:: comfortable CMS Medicare.gov Compare Post Acute Care list provided to:: Patient Choice offered to / list presented to : Patient, Spouse, Adult Children  Expected Discharge Plan and Services Expected Discharge Plan: [place] In-house Referral: Hospice / Palliative Care Discharge Planning Services: CM Consult   Living arrangements for the past 2 months: Single Family Home                 DME Arranged: N/A DME Agency: NA       HH Arranged: NA          Prior Living Arrangements/Services Living arrangements for the past 2 months: Single Family Home Lives with:: Spouse          Need for Family Participation in Patient Care: Yes (Comment) Care giver support system in place?: Yes (comment)   Criminal Activity/Legal Involvement Pertinent to Current Situation/Hospitalization: No - Comment as needed  Activities of Daily Living Home Assistive Devices/Equipment: Eyeglasses ADL Screening (condition at time of admission) Patient's cognitive ability adequate to safely  complete daily activities?: No Is the patient deaf or have difficulty hearing?: Yes Does the patient have difficulty seeing, even when wearing glasses/contacts?: No Does the patient have difficulty concentrating, remembering, or making decisions?: Yes Patient able to express need for assistance with ADLs?: Yes Does the patient have difficulty dressing or bathing?: Yes Independently performs ADLs?: Yes (appropriate for developmental age) Does the patient have difficulty walking or climbing stairs?: Yes Weakness of Legs: Both Weakness of Arms/Hands: Both  Permission Sought/Granted   Permission granted to share information with : Yes, Verbal Permission Granted  Share Information with NAME: wife [name]           Emotional Assessment Appearance:: Appears stated age Attitude/Demeanor/Rapport: Engaged   Orientation: : Oriented to Self, Oriented to Place, Oriented to  Time, Oriented to Situation Alcohol / Substance Use: Not Applicable Psych Involvement: No (comment)  Admission diagnosis:  Hypokalemia [E87.6] CHF (congestive heart failure) (HCC) [I50.9] SOB (shortness of breath) [R06.02] Troponin level elevated [R79.89] AKI (acute kidney injury) ([place]) [N17.9] Patient Active Problem List   Diagnosis Date Noted   Pulmonary hypertension ([place]) 01/23/2022   Elevated troponin 01/23/2022   Acute on chronic systolic CHF (congestive heart failure) ([place]) 01/23/2022   CHF (congestive heart failure) ([place]) 01/23/2022   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) ([place]) 08/19/2021   Moderate to severe mitral regurgitation 08/19/2021   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   Atherosclerosis of aorta ([place]) 07/10/2019   Senile purpura ([place]) 03/17/2017   B12 deficiency 01/15/2017   BPH  associated with nocturia 10/30/2016   Hypothyroidism 09/11/2016   Depression, major, single episode, moderate ([place]) 04/02/2016   Ascending aortic aneurysm ([place]) 03/25/2016   Diabetic  neuropathy ([place]) 12/26/2015   Herpes zoster 07/15/2015   GERD (gastroesophageal reflux disease) 06/10/2015   History of tonsillar cancer 2016- with history of radiation 10/31/2014   Arthropathy 03/10/2013   Low back pain 11/26/2011   Kidney stone 11/04/2011   Osteoarthritis 11/18/2010   CAD (coronary artery disease) 10/11/2008   Gout 03/28/2007   Type 2 diabetes mellitus with peripheral neuropathy ([place]) 01/24/2007   Hyperlipidemia LDL goal <70 01/24/2007   Essential hypertension 01/24/2007   PCP:  Marin Olp, MD Pharmacy:   [id number] [place], [place] - [address] [place] [address]Phone: [phone]: [address] [place]: [phone]: [phone] [id number] [place] [place]one: [phone]: [phone]   Social Determinants of Health (SDOH) Interventions    Readmission Risk Interventions     No data to display

## 2022-01-26 NOTE — Care Management Important Message (Signed)
Important Message  Patient Details  Name: Kevin Mcclain MRN: [id number] Date of Birth: March 13, 1945   Medicare Important Message Given:  Yes     Hannah Beat 01/26/2022, 1:21 PM

## 2022-01-26 NOTE — Progress Notes (Signed)
This chaplain responded to the RN's consult for EOL spiritual care. After receiving an update from PMT and RN-Berna, the chaplain understands the comfort care Pt. and family is working with [facility] for Hospice care.  The chaplain introduced herself to the Pt. at the bedside. Family is not present. The Pt. is awake and chooses to have the chaplain visit later. The Pt. communicates he prefers to rest.  The chaplain will plan a revisit.  Chaplain Sallyanne Kuster [phone]

## 2022-01-26 NOTE — Progress Notes (Signed)
[facility]) Hospital Liaison Note  Received request from [place], [facility] for family interest in [facility]. Spoke with patient's wife, [name], and daughter, [name] to confirm interest and explain services. Eligibility confirmed per [facility] MD. Family requests that patient be transferred tomorrow if bed is available. Will follow up tomorrow.   Heather Wile, TOC aware.  Thank you, Zigmund Gottron RN  [facility] Liaison [phone]

## 2022-01-27 DIAGNOSIS — I5020 Unspecified systolic (congestive) heart failure: Secondary | ICD-10-CM | POA: Diagnosis not present

## 2022-01-27 MED ORDER — LORAZEPAM 1 MG PO TABS: 1.0000 mg | ORAL_TABLET | ORAL | 0 refills | Status: AC | PRN

## 2022-01-27 NOTE — TOC CM/SW Note (Addendum)
See note from [facility].   PTAR paperwork and signed DNR form on chart. Nurse has number to call report. NCM will call PTAR when notified family has completed consents and discharge completed   1100 PTAR called

## 2022-01-27 NOTE — Progress Notes (Signed)
Patient Kevin Mcclain      DOB: [birth year]      ZLD:[id number]      Palliative Medicine Team    Patient with bed offered at [facility] residential facility this morning. Patient remains stable for transfer. Bedside RN without needs or concerns. This RN remains available for any assistance. Will continue to follow for any changes or advances.    Thank you for allowing the Palliative Medicine Team to assist in the care of this patient.     Damian Leavell, MSN, [place] Palliative Medicine Team Team Phone: [phone]  This phone is monitored 7a-7p, please reach out to attending physician outside of these hours for urgent needs.

## 2022-01-27 NOTE — Progress Notes (Signed)
[facility]. Report given to [facility], Therapist, sports at [facility]. All of nurse's questions answered to her satisfaction. This writer's number given to [facility] to call incase of any questions. Pt left unit on stretcher pushed by ambulance staff. Left in stable condition.

## 2022-01-27 NOTE — Discharge Summary (Signed)
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  History of present illness:  76/M with history of CAD/CABG, chronic systolic CHF With EF of 23%, severe MR, history of tonsillar carcinoma/s/p XRT, chronic pain on long-term narcotics, type 2 diabetes mellitus, ongoing failure to thrive for 6 months presented to the ED with weakness and dyspnea   Hospital Course:  76/M with history of CAD/CABG, chronic systolic CHF With EF of 76%, severe MR, history of tonsillar carcinoma/s/p XRT, chronic pain on long-term narcotics, type 2 diabetes mellitus, ongoing failure to thrive for 6 months presented to the ED with weakness and dyspnea. -Found to be in cardiogenic shock with worsening cardiomyopathy -Repeat echo with EF down to 20/25%, moderately decreased RV function and severe MR, with worsening kidney function and liver enzymes, lactic acid acidosis.  Seen by advanced heart failure team and palliative care, transitioned to comfort measures  -Continue comfort focused care per palliative team -plan for residential hospice   Discharge Exam: Vitals:   01/26/22 0429 01/27/22 0808  BP: 122/68 131/72  Pulse: 66 85  Resp: 17 16  Temp: (!) 97.4 F (36.3 C) 99.1 F  (37.3 C)  SpO2: 99% 98%   Chronically ill male laying in bed, somnolent, arousable, CVS: S1-S2, regular rhythm Lungs: Poor air movement bilaterally  Discharge Instructions   Discharge Instructions     Diet - low sodium heart healthy   Complete by: As directed    Increase activity slowly   Complete by: As directed       Allergies as of 01/27/2022       Reactions   Ace Inhibitors Other (See Comments), Cough   OK with ramipril   Codeine Itching   Januvia [sitagliptin] Itching   Tessalon [benzonatate] Swelling   Throat swelling        Medication List     STOP taking these medications    aspirin EC 81 MG tablet   atorvastatin 20 MG tablet Commonly known as: LIPITOR   levothyroxine 100 MCG tablet Commonly known as: SYNTHROID   loperamide 2 MG capsule Commonly known as: IMODIUM   metoprolol succinate 25 MG 24 hr tablet Commonly known as: TOPROL-XL   nitroGLYCERIN 0.4 MG SL tablet Commonly known as: NITROSTAT   ondansetron 4 MG tablet Commonly known as: ZOFRAN   OneTouch Verio test strip Generic drug: glucose blood   ramipril 5 MG capsule Commonly known as: ALTACE   spironolactone 25 MG tablet Commonly known as: ALDACTONE   VITAMIN D-3 PO       TAKE these medications    CLEAR EYES OP Place 1 drop into both eyes 4 (four) times daily as needed (dry eyes).   fentaNYL 50 MCG/HR Commonly known as: [place] 1 patch onto the skin every 3 (three) days. Start taking on: February 13, 2022  LORazepam 1 MG tablet Commonly known as: ATIVAN Take 1 tablet (1 mg total) by mouth every 4 (four) hours as needed for anxiety, seizure or sleep (distress).   oxyCODONE 5 MG/5ML solution Commonly known as: ROXICODONE Take 10-15 mLs (10-15 mg total) by mouth every 4 (four) hours as needed for breakthrough cancer pain Start taking on: February 13, 2022   sertraline 100 MG tablet Commonly known as: ZOLOFT Take 100 mg by mouth at bedtime.        Allergies  Allergen Reactions   Ace Inhibitors Other (See Comments) and Cough    OK with ramipril   Codeine Itching   Januvia [Sitagliptin] Itching   Tessalon [Benzonatate] Swelling    Throat swelling      The results of significant diagnostics from this hospitalization (including imaging, microbiology, ancillary and laboratory) are listed below for reference.    Significant Diagnostic Studies: ECHOCARDIOGRAM COMPLETE  Result Date: 01/24/2022    ECHOCARDIOGRAM REPORT   Patient Name:   [name] Date of Exam: 01/24/2022 Medical Rec #:  [id number]         Height:       65.0 in Accession #:    [id number]        Weight:       124.0 lb Date of Birth:  05/31/1945          BSA:          1.614 m Patient Age:    50 years          BP:           115/67 mmHg Patient Gender: M                 HR:           76 bpm. Exam Location:  Inpatient Procedure: 2D Echo, Cardiac Doppler and Color Doppler STAT ECHO Indications:    CHF-Acute Diastolic N46.27  History:        Patient has prior history of Echocardiogram examinations, most                 recent 07/04/2021. CHF, CAD; Risk Factors:Hypertension, Diabetes                 and Dyslipidemia. Elevated troponin, HFmrEF (heart failure with                 mldly reduced ejection fraction) ([place]), Moderate to severe                 mitral regurgitation.  Sonographer:    Alvino Chapel RCS Referring Phys: [place]  1. Left ventricular ejection fraction, by estimation, is 25 to 30%. The left ventricle has severely decreased function. The left ventricle demonstrates regional wall motion abnormalities (see scoring diagram/findings for description). The left ventricular internal cavity size was mildly dilated. There is mild left ventricular hypertrophy. Left ventricular diastolic parameters are consistent with Grade II diastolic dysfunction (pseudonormalization).  2. Right ventricular systolic function is moderately reduced. The right ventricular  size is normal. There is mildly elevated pulmonary artery systolic pressure. The estimated right ventricular systolic pressure is 03.5 mmHg.  3. Left atrial size was severely dilated.  4. Right atrial size was mildly dilated.  5. Moderate pleural effusion in the left lateral region.  6. PISA ERO 0.35 cm2 and regugitant volume 54 ml. The mitral valve is abnormal, thickened and with restricted posterio leaflet motion and incomplete coaptation. Moderate to severe mitral valve regurgitation, upper  end of scale, Carpentier class IIIb.  7. The aortic valve is tricuspid. Aortic valve regurgitation is mild to moderate. Aortic valve sclerosis is present, with no evidence of aortic valve stenosis.  8. The inferior vena cava is normal in size with <50% respiratory variability, suggesting right atrial pressure of 8 mmHg. Comparison(s): Prior images reviewed side by side. LVEF has decreased in comparison. Mitral regurgitation borderline severe by PISA. FINDINGS  Left Ventricle: Left ventricular ejection fraction, by estimation, is 25 to 30%. The left ventricle has severely decreased function. The left ventricle demonstrates regional wall motion abnormalities. The left ventricular internal cavity size was mildly  dilated. There is mild left ventricular hypertrophy. Left ventricular diastolic parameters are consistent with Grade II diastolic dysfunction (pseudonormalization).  LV Wall Scoring: The basal inferior segment is akinetic. The entire anterior wall, entire lateral wall, entire septum, entire apex, and mid and distal inferior wall are hypokinetic. Right Ventricle: The right ventricular size is normal. No increase in right ventricular wall thickness. Right ventricular systolic function is moderately reduced. There is mildly elevated pulmonary artery systolic pressure. The tricuspid regurgitant velocity is 2.80 m/s, and with an assumed right atrial pressure of 8 mmHg, the estimated right ventricular systolic pressure is 41.9  mmHg. Left Atrium: Left atrial size was severely dilated. Right Atrium: Right atrial size was mildly dilated. Pericardium: There is no evidence of pericardial effusion. Mitral Valve: PISA ERO 0.35 cm2 and regugitant volume 54 ml. The mitral valve is abnormal. There is mild thickening of the mitral valve leaflet(s). Moderate to severe mitral valve regurgitation, with posteriorly-directed jet. MV peak gradient, 6.2 mmHg. The mean mitral valve gradient is 2.0 mmHg. Tricuspid Valve: The tricuspid valve is grossly normal. Tricuspid valve regurgitation is mild. Aortic Valve: The aortic valve is tricuspid. There is mild aortic valve annular calcification. Aortic valve regurgitation is mild to moderate. Aortic regurgitation PHT measures 400 msec. Aortic valve sclerosis is present, with no evidence of aortic valve  stenosis. Pulmonic Valve: The pulmonic valve was grossly normal. Pulmonic valve regurgitation is trivial. Aorta: The aortic root is normal in size and structure. Venous: The inferior vena cava is normal in size with less than 50% respiratory variability, suggesting right atrial pressure of 8 mmHg. IAS/Shunts: No atrial level shunt detected by color flow Doppler. Additional Comments: There is a moderate pleural effusion in the left lateral region.  LEFT VENTRICLE PLAX 2D LVIDd:         5.60 cm      Diastology LVIDs:         4.90 cm      LV e' medial:    2.89 cm/s LV PW:         1.00 cm      LV E/e' medial:  39.1 LV IVS:        1.00 cm      LV e' lateral:   4.81 cm/s LVOT diam:     2.00 cm      LV E/e' lateral: 23.5 LV SV:         41 LV SV Index:   25 LVOT Area:     3.14 cm  LV Volumes (MOD) LV vol d, MOD A2C: 149.0 ml LV vol d, MOD A4C: 140.5 ml LV vol s, MOD A2C: 107.0 ml LV vol s, MOD A4C: 87.7 ml LV SV MOD A2C:     42.0 ml LV SV MOD A4C:     140.5 ml LV SV MOD BP:  46.2 ml RIGHT VENTRICLE RV S prime:     7.15 cm/s TAPSE (M-mode): 1.4 cm LEFT ATRIUM              Index        RIGHT ATRIUM           Index LA  diam:        4.20 cm  2.60 cm/m   RA Area:     20.20 cm LA Vol (A2C):   113.0 ml 69.99 ml/m  RA Volume:   58.90 ml  36.48 ml/m LA Vol (A4C):   70.5 ml  43.67 ml/m LA Biplane Vol: 90.5 ml  56.06 ml/m  AORTIC VALVE LVOT Vmax:   69.20 cm/s LVOT Vmean:  43.200 cm/s LVOT VTI:    0.131 m AI PHT:      400 msec  AORTA Ao Root diam: 3.40 cm MITRAL VALVE                  TRICUSPID VALVE MV Area (PHT): 4.19 cm       TR Peak grad:   31.4 mmHg MV Area VTI:   1.43 cm       TR Vmax:        280.00 cm/s MV Peak grad:  6.2 mmHg MV Mean grad:  2.0 mmHg       SHUNTS MV Vmax:       1.24 m/s       Systemic VTI:  0.13 m MV Vmean:      59.1 cm/s      Systemic Diam: 2.00 cm MV Decel Time: 181 msec MR Peak grad:    85.4 mmHg MR Mean grad:    47.0 mmHg MR Vmax:         462.00 cm/s MR Vmean:        308.0 cm/s MR PISA:         4.02 cm MR PISA Eff ROA: 28 mm MR PISA Radius:  0.80 cm MV E velocity: 113.00 cm/s MV A velocity: 65.60 cm/s MV E/A ratio:  1.72 Rozann Lesches MD Electronically signed by Rozann Lesches MD Signature Date/Time: 01/24/2022/1:23:10 PM    Final    [place] EKG SITE RITE  Result Date: 01/24/2022 If Site Rite image not attached, placement could not be confirmed due to current cardiac rhythm.  DG Chest 2 View  Result Date: 01/23/2022 CLINICAL DATA:  Shortness of breath and upper abdominal pain. EXAM: CHEST - 2 VIEW COMPARISON:  09/01/2010 FINDINGS: Sternotomy wires intact. Lungs are hypoinflated with mild blunting of the right costophrenic angle likely small amount of pleural fluid versus pleuroparenchymal scarring. No airspace consolidation. Cardiomediastinal silhouette and remainder of the exam is unremarkable. IMPRESSION: Hypoinflation with mild blunting of the right costophrenic angle likely small amount of pleural fluid versus pleuroparenchymal scarring. Electronically Signed   By: Kevin Mcclain M.D.   On: 01/23/2022 15:47   CT Abdomen Pelvis W Contrast  Result Date: 01/22/2022 CLINICAL DATA:  Acute  abdominal pain. EXAM: CT ABDOMEN AND PELVIS WITH CONTRAST TECHNIQUE: Multidetector CT imaging of the abdomen and pelvis was performed using the standard protocol following bolus administration of intravenous contrast. RADIATION DOSE REDUCTION: This exam was performed according to the departmental dose-optimization program which includes automated exposure control, adjustment of the mA and/or kV according to patient size and/or use of iterative reconstruction technique. CONTRAST:  22m OMNIPAQUE IOHEXOL 350 MG/ML SOLN COMPARISON:  CT scan 03/23/2016 FINDINGS: Lower chest: Moderate-sized bilateral pleural effusions with overlying atelectasis. The  heart is within normal limits in size. No pericardial effusion. Aortic and coronary artery calcifications are noted. Hepatobiliary: Stable pneumobilia related to prior cholecystectomy and sphincterotomy. No hepatic lesions or intrahepatic biliary dilatation. No common bile duct dilatation. Pancreas: No mass, inflammation or ductal dilatation. Spleen: Stable 4 cm splenic lesions, likely a benign hemangioma. No change since prior studies. Adrenals/Urinary Tract: The adrenal glands are normal. Stable bilateral simple renal cysts. No worrisome renal lesions. No follow-up imaging is necessary. The bladder is unremarkable. Stomach/Bowel: The stomach, duodenum, small bowel and colon are unremarkable. No acute inflammatory process, mass lesions or obstructive findings. The terminal ileum and appendix are normal. Scattered descending and sigmoid colon diverticulosis Vascular/Lymphatic: Stable atherosclerotic calcifications involving the aorta and branch vessels but no aneurysm or dissection. The major venous structures are patent. No mesenteric or retroperitoneal mass or adenopathy. Reproductive: The prostate gland and seminal vesicles are unremarkable. Other: No pelvic mass or adenopathy. No free pelvic fluid collections. No inguinal mass or adenopathy. No abdominal wall hernia or  subcutaneous lesions. Musculoskeletal: No significant bony findings. IMPRESSION: 1. No acute abdominal/pelvic findings, mass lesions or adenopathy. 2. Moderate-sized bilateral pleural effusions with overlying atelectasis. 3. Stable 4 cm splenic lesion, likely benign hemangioma. 4. Stable pneumobilia related to prior cholecystectomy and sphincterotomy. 5. Aortic atherosclerosis. Aortic Atherosclerosis (ICD10-I70.0). Electronically Signed   By: Marijo Sanes M.D.   On: 01/22/2022 14:16    Microbiology: Recent Results (from the past 240 hour(s))  Resp Panel by RT-PCR (Flu A&B, Covid) Anterior Nasal Swab     Status: None   Collection Time: 01/22/22  3:48 PM   Specimen: Anterior Nasal Swab  Result Value Ref Range Status   SARS Coronavirus 2 by RT PCR NEGATIVE NEGATIVE Final    Comment: (NOTE) SARS-CoV-2 target nucleic acids are NOT DETECTED.  The SARS-CoV-2 RNA is generally detectable in upper respiratory specimens during the acute phase of infection. The lowest concentration of SARS-CoV-2 viral copies this assay can detect is 138 copies/mL. A negative result does not preclude SARS-Cov-2 infection and should not be used as the sole basis for treatment or other patient management decisions. A negative result may occur with  improper specimen collection/handling, submission of specimen other than nasopharyngeal swab, presence of viral mutation(s) within the areas targeted by this assay, and inadequate number of viral copies(<138 copies/mL). A negative result must be combined with clinical observations, patient history, and epidemiological information. The expected result is Negative.  Fact Sheet for Patients:  EntrepreneurPulse.com.au  Fact Sheet for Healthcare Providers:  IncredibleEmployment.be  This test is no t yet approved or cleared by the [place] FDA and  has been authorized for detection and/or diagnosis of SARS-CoV-2 by FDA under an  Emergency Use Authorization (EUA). This EUA will remain  in effect (meaning this test can be used) for the duration of the COVID-19 declaration under Section 564(b)(1) of the Act, 21 U.S.C.section 360bbb-3(b)(1), unless the authorization is terminated  or revoked sooner.       Influenza A by PCR NEGATIVE NEGATIVE Final   Influenza B by PCR NEGATIVE NEGATIVE Final    Comment: (NOTE) The Xpert Xpress SARS-CoV-2/FLU/RSV plus assay is intended as an aid in the diagnosis of influenza from Nasopharyngeal swab specimens and should not be used as a sole basis for treatment. Nasal washings and aspirates are unacceptable for Xpert Xpress SARS-CoV-2/FLU/RSV testing.  Fact Sheet for Patients: EntrepreneurPulse.com.au  Fact Sheet for Healthcare Providers: IncredibleEmployment.be  This test is not yet approved or cleared by the  [place] Architectural technologist and has been authorized for detection and/or diagnosis of SARS-CoV-2 by FDA under an Print production planner (EUA). This EUA will remain in effect (meaning this test can be used) for the duration of the COVID-19 declaration under Section 564(b)(1) of the Act, 21 U.S.C. section 360bbb-3(b)(1), unless the authorization is terminated or revoked.  Performed at [facility], [place] [address], [place], [place] [address]      Labs: Basic Metabolic Panel: Recent Labs  Lab 01/22/22 1154 01/23/22 1514 01/23/22 1653 01/24/22 0408  NA 139 139  --  139  K 2.6* 2.7*  --  4.3  CL 97* 99  --  100  CO2 26 23  --  23  GLUCOSE 189* 214*  --  202*  BUN 12 16  --  24*  CREATININE 1.26* 1.61*  --  2.03*  CALCIUM 9.1 9.0  --  8.9  MG  --   --  2.1  --    Liver Function Tests: Recent Labs  Lab 01/22/22 1154 01/23/22 1514 01/24/22 0408  AST 34 43* 824*  ALT 31 33 526*  ALKPHOS 53 52 100  BILITOT 2.7* 2.1* 3.0*  PROT 6.3* 6.3* 5.7*  ALBUMIN 3.5 3.6 3.2*   Recent Labs  Lab 01/22/22 1154  LIPASE 28    Recent Labs  Lab 01/22/22 1247  AMMONIA 22   CBC: Recent Labs  Lab 01/22/22 1154 01/23/22 1514 01/24/22 0408  WBC 10.9* 9.1 10.3  HGB 12.4* 11.8* 11.1*  HCT 35.9* 36.5* 33.5*  MCV 88.4 93.6 91.3  PLT 272 236 268   Cardiac Enzymes: No results for input(s): "CKTOTAL", "CKMB", "CKMBINDEX", "TROPONINI" in the last 168 hours. BNP: BNP (last 3 results) Recent Labs    01/23/22 1653  BNP 2,942.0*    ProBNP (last 3 results) No results for input(s): "PROBNP" in the last 8760 hours.  CBG: No results for input(s): "GLUCAP" in the last 168 hours.     Signed:  Domenic Polite MD.  [facility] 01/27/2022, 10:11 AM

## 2022-02-06 ENCOUNTER — Telehealth: Payer: Self-pay | Admitting: Family Medicine

## 2022-02-06 NOTE — Telephone Encounter (Signed)
Patient's wife called to let Dr. Yong Channel know that Patient passed away on 02-27-22.

## 2022-02-06 NOTE — Telephone Encounter (Signed)
Called and LVM offering condolences

## 2022-02-10 ENCOUNTER — Ambulatory Visit: Payer: Medicare HMO | Admitting: Family Medicine

## 2022-02-13 ENCOUNTER — Other Ambulatory Visit (HOSPITAL_COMMUNITY): Payer: Self-pay

## 2022-02-25 ENCOUNTER — Other Ambulatory Visit (HOSPITAL_COMMUNITY): Payer: Self-pay

## 2022-03-02 DEATH — deceased

## 2022-05-12 ENCOUNTER — Other Ambulatory Visit: Payer: Self-pay
